# Patient Record
Sex: Female | Born: 1950 | Race: Black or African American | Hispanic: No | Smoking: Never smoker
Health system: Southern US, Community
[De-identification: ages and names within clinical notes are randomized; demographics above are authoritative.]

## PROBLEM LIST (undated history)

## (undated) DIAGNOSIS — K219 Gastro-esophageal reflux disease without esophagitis: Secondary | ICD-10-CM

## (undated) DIAGNOSIS — J90 Pleural effusion, not elsewhere classified: Secondary | ICD-10-CM

## (undated) DIAGNOSIS — I2510 Atherosclerotic heart disease of native coronary artery without angina pectoris: Secondary | ICD-10-CM

## (undated) DIAGNOSIS — I509 Heart failure, unspecified: Secondary | ICD-10-CM

## (undated) DIAGNOSIS — J069 Acute upper respiratory infection, unspecified: Secondary | ICD-10-CM

## (undated) DIAGNOSIS — I255 Ischemic cardiomyopathy: Secondary | ICD-10-CM

## (undated) DIAGNOSIS — Z8669 Personal history of other diseases of the nervous system and sense organs: Secondary | ICD-10-CM

## (undated) DIAGNOSIS — Z951 Presence of aortocoronary bypass graft: Secondary | ICD-10-CM

## (undated) DIAGNOSIS — I10 Essential (primary) hypertension: Secondary | ICD-10-CM

## (undated) DIAGNOSIS — I5023 Acute on chronic systolic (congestive) heart failure: Secondary | ICD-10-CM

## (undated) DIAGNOSIS — B9789 Other viral agents as the cause of diseases classified elsewhere: Secondary | ICD-10-CM

## (undated) DIAGNOSIS — R1110 Vomiting, unspecified: Secondary | ICD-10-CM

## (undated) DIAGNOSIS — E785 Hyperlipidemia, unspecified: Secondary | ICD-10-CM

## (undated) DIAGNOSIS — K851 Biliary acute pancreatitis without necrosis or infection: Secondary | ICD-10-CM

## (undated) DIAGNOSIS — E119 Type 2 diabetes mellitus without complications: Secondary | ICD-10-CM

## (undated) DIAGNOSIS — Z86010 Personal history of colonic polyps: Secondary | ICD-10-CM

## (undated) DIAGNOSIS — I5022 Chronic systolic (congestive) heart failure: Secondary | ICD-10-CM

## (undated) DIAGNOSIS — J189 Pneumonia, unspecified organism: Secondary | ICD-10-CM

## (undated) DIAGNOSIS — R0600 Dyspnea, unspecified: Secondary | ICD-10-CM

## (undated) HISTORY — DX: Acute upper respiratory infection, unspecified: J06.9

## (undated) HISTORY — DX: Pleural effusion, not elsewhere classified: J90

## (undated) HISTORY — DX: Other viral agents as the cause of diseases classified elsewhere: B97.89

## (undated) HISTORY — DX: Gastro-esophageal reflux disease without esophagitis: K21.9

## (undated) HISTORY — DX: Essential (primary) hypertension: I10

## (undated) HISTORY — DX: Vomiting, unspecified: R11.10

## (undated) HISTORY — DX: Acute on chronic systolic (congestive) heart failure: I50.23

## (undated) HISTORY — DX: Personal history of colonic polyps: Z86.010

## (undated) HISTORY — DX: Hyperlipidemia, unspecified: E78.5

## (undated) HISTORY — DX: Personal history of other diseases of the nervous system and sense organs: Z86.69

---

## 1994-01-29 HISTORY — PX: TOTAL ABDOMINAL HYSTERECTOMY W/ BILATERAL SALPINGOOPHORECTOMY: SHX83

## 1999-10-08 ENCOUNTER — Encounter: Payer: Self-pay | Admitting: Emergency Medicine

## 1999-10-08 ENCOUNTER — Emergency Department (HOSPITAL_COMMUNITY): Admission: EM | Admit: 1999-10-08 | Discharge: 1999-10-08 | Payer: Self-pay | Admitting: Emergency Medicine

## 1999-10-25 ENCOUNTER — Other Ambulatory Visit: Admission: RE | Admit: 1999-10-25 | Discharge: 1999-10-25 | Payer: Self-pay | Admitting: *Deleted

## 2002-01-22 ENCOUNTER — Emergency Department (HOSPITAL_COMMUNITY): Admission: EM | Admit: 2002-01-22 | Discharge: 2002-01-22 | Payer: Self-pay | Admitting: *Deleted

## 2002-03-18 ENCOUNTER — Encounter: Admission: RE | Admit: 2002-03-18 | Discharge: 2002-03-18 | Payer: Self-pay | Admitting: Internal Medicine

## 2002-03-24 ENCOUNTER — Ambulatory Visit (HOSPITAL_COMMUNITY): Admission: RE | Admit: 2002-03-24 | Discharge: 2002-03-24 | Payer: Self-pay | Admitting: *Deleted

## 2002-08-31 ENCOUNTER — Emergency Department (HOSPITAL_COMMUNITY): Admission: EM | Admit: 2002-08-31 | Discharge: 2002-08-31 | Payer: Self-pay | Admitting: Emergency Medicine

## 2002-09-02 ENCOUNTER — Emergency Department (HOSPITAL_COMMUNITY): Admission: EM | Admit: 2002-09-02 | Discharge: 2002-09-02 | Payer: Self-pay | Admitting: Emergency Medicine

## 2002-09-08 ENCOUNTER — Emergency Department (HOSPITAL_COMMUNITY): Admission: EM | Admit: 2002-09-08 | Discharge: 2002-09-08 | Payer: Self-pay | Admitting: Emergency Medicine

## 2002-09-08 ENCOUNTER — Encounter: Payer: Self-pay | Admitting: Emergency Medicine

## 2002-09-10 ENCOUNTER — Emergency Department (HOSPITAL_COMMUNITY): Admission: EM | Admit: 2002-09-10 | Discharge: 2002-09-10 | Payer: Self-pay | Admitting: Emergency Medicine

## 2002-11-08 ENCOUNTER — Encounter: Payer: Self-pay | Admitting: Emergency Medicine

## 2002-11-08 ENCOUNTER — Emergency Department (HOSPITAL_COMMUNITY): Admission: EM | Admit: 2002-11-08 | Discharge: 2002-11-08 | Payer: Self-pay | Admitting: Emergency Medicine

## 2002-11-09 ENCOUNTER — Encounter: Admission: RE | Admit: 2002-11-09 | Discharge: 2002-11-09 | Payer: Self-pay | Admitting: Internal Medicine

## 2002-11-11 ENCOUNTER — Encounter: Admission: RE | Admit: 2002-11-11 | Discharge: 2002-11-11 | Payer: Self-pay | Admitting: Internal Medicine

## 2003-07-12 ENCOUNTER — Encounter: Admission: RE | Admit: 2003-07-12 | Discharge: 2003-07-12 | Payer: Self-pay | Admitting: Internal Medicine

## 2003-07-14 ENCOUNTER — Ambulatory Visit (HOSPITAL_COMMUNITY): Admission: RE | Admit: 2003-07-14 | Discharge: 2003-07-14 | Payer: Self-pay | Admitting: Internal Medicine

## 2003-08-06 ENCOUNTER — Emergency Department (HOSPITAL_COMMUNITY): Admission: EM | Admit: 2003-08-06 | Discharge: 2003-08-06 | Payer: Self-pay | Admitting: Emergency Medicine

## 2004-12-07 ENCOUNTER — Ambulatory Visit (HOSPITAL_COMMUNITY): Admission: RE | Admit: 2004-12-07 | Discharge: 2004-12-07 | Payer: Self-pay | Admitting: Internal Medicine

## 2004-12-07 ENCOUNTER — Ambulatory Visit: Payer: Self-pay | Admitting: Internal Medicine

## 2004-12-20 ENCOUNTER — Ambulatory Visit: Payer: Self-pay | Admitting: Internal Medicine

## 2005-03-09 ENCOUNTER — Ambulatory Visit: Payer: Self-pay | Admitting: Internal Medicine

## 2005-03-22 ENCOUNTER — Encounter (INDEPENDENT_AMBULATORY_CARE_PROVIDER_SITE_OTHER): Payer: Self-pay | Admitting: Ophthalmology

## 2005-03-22 ENCOUNTER — Ambulatory Visit (HOSPITAL_COMMUNITY): Admission: RE | Admit: 2005-03-22 | Discharge: 2005-03-22 | Payer: Self-pay | Admitting: Internal Medicine

## 2005-05-31 ENCOUNTER — Ambulatory Visit: Payer: Self-pay | Admitting: Internal Medicine

## 2005-06-12 ENCOUNTER — Ambulatory Visit: Payer: Self-pay | Admitting: Internal Medicine

## 2005-07-12 ENCOUNTER — Ambulatory Visit: Payer: Self-pay | Admitting: Internal Medicine

## 2005-12-05 DIAGNOSIS — E785 Hyperlipidemia, unspecified: Secondary | ICD-10-CM

## 2005-12-05 DIAGNOSIS — E119 Type 2 diabetes mellitus without complications: Secondary | ICD-10-CM

## 2005-12-05 DIAGNOSIS — I10 Essential (primary) hypertension: Secondary | ICD-10-CM

## 2005-12-05 DIAGNOSIS — K219 Gastro-esophageal reflux disease without esophagitis: Secondary | ICD-10-CM

## 2005-12-05 DIAGNOSIS — E1122 Type 2 diabetes mellitus with diabetic chronic kidney disease: Secondary | ICD-10-CM | POA: Insufficient documentation

## 2005-12-05 HISTORY — DX: Type 2 diabetes mellitus without complications: E11.9

## 2005-12-05 HISTORY — DX: Gastro-esophageal reflux disease without esophagitis: K21.9

## 2005-12-05 HISTORY — DX: Essential (primary) hypertension: I10

## 2006-04-24 ENCOUNTER — Telehealth: Payer: Self-pay | Admitting: *Deleted

## 2006-04-29 ENCOUNTER — Emergency Department (HOSPITAL_COMMUNITY): Admission: EM | Admit: 2006-04-29 | Discharge: 2006-04-29 | Payer: Self-pay | Admitting: Emergency Medicine

## 2006-08-30 ENCOUNTER — Telehealth (INDEPENDENT_AMBULATORY_CARE_PROVIDER_SITE_OTHER): Payer: Self-pay | Admitting: *Deleted

## 2006-09-16 ENCOUNTER — Ambulatory Visit: Payer: Self-pay | Admitting: Internal Medicine

## 2006-09-16 LAB — CONVERTED CEMR LAB
Blood Glucose, Fingerstick: 499
Hgb A1c MFr Bld: 8.7 %

## 2006-09-17 ENCOUNTER — Encounter (INDEPENDENT_AMBULATORY_CARE_PROVIDER_SITE_OTHER): Payer: Self-pay | Admitting: *Deleted

## 2006-09-17 ENCOUNTER — Ambulatory Visit: Payer: Self-pay | Admitting: Internal Medicine

## 2006-09-20 ENCOUNTER — Ambulatory Visit (HOSPITAL_COMMUNITY): Admission: RE | Admit: 2006-09-20 | Discharge: 2006-09-20 | Payer: Self-pay | Admitting: Internal Medicine

## 2006-09-24 LAB — CONVERTED CEMR LAB
ALT: 30 units/L (ref 0–35)
AST: 26 units/L (ref 0–37)
Alkaline Phosphatase: 101 units/L (ref 39–117)
CO2: 28 meq/L (ref 19–32)
Calcium: 9.6 mg/dL (ref 8.4–10.5)
Cholesterol: 194 mg/dL (ref 0–200)
Creatinine, Ser: 0.86 mg/dL (ref 0.40–1.20)
Glucose, Bld: 182 mg/dL — ABNORMAL HIGH (ref 70–99)
Potassium: 5.1 meq/L (ref 3.5–5.3)
Sodium: 138 meq/L (ref 135–145)
Total CHOL/HDL Ratio: 3.3
VLDL: 23 mg/dL (ref 0–40)

## 2006-12-01 ENCOUNTER — Emergency Department (HOSPITAL_COMMUNITY): Admission: EM | Admit: 2006-12-01 | Discharge: 2006-12-01 | Payer: Self-pay | Admitting: Emergency Medicine

## 2007-06-27 DIAGNOSIS — H538 Other visual disturbances: Secondary | ICD-10-CM

## 2007-06-30 DIAGNOSIS — Z8669 Personal history of other diseases of the nervous system and sense organs: Secondary | ICD-10-CM

## 2007-06-30 HISTORY — DX: Personal history of other diseases of the nervous system and sense organs: Z86.69

## 2007-07-01 ENCOUNTER — Observation Stay (HOSPITAL_COMMUNITY): Admission: AD | Admit: 2007-07-01 | Discharge: 2007-07-03 | Payer: Self-pay | Admitting: Hospitalist

## 2007-07-01 ENCOUNTER — Ambulatory Visit: Payer: Self-pay | Admitting: Hospitalist

## 2007-07-01 ENCOUNTER — Ambulatory Visit: Payer: Self-pay | Admitting: *Deleted

## 2007-07-01 ENCOUNTER — Ambulatory Visit: Payer: Self-pay | Admitting: Cardiovascular Disease

## 2007-07-01 ENCOUNTER — Encounter: Admission: RE | Admit: 2007-07-01 | Discharge: 2007-07-01 | Payer: Self-pay | Admitting: *Deleted

## 2007-07-01 LAB — CONVERTED CEMR LAB: Hgb A1c MFr Bld: 7.5 %

## 2007-07-02 ENCOUNTER — Encounter (INDEPENDENT_AMBULATORY_CARE_PROVIDER_SITE_OTHER): Payer: Self-pay | Admitting: Hospitalist

## 2007-07-02 ENCOUNTER — Ambulatory Visit: Payer: Self-pay | Admitting: Vascular Surgery

## 2007-07-02 DIAGNOSIS — R809 Proteinuria, unspecified: Secondary | ICD-10-CM

## 2007-07-23 ENCOUNTER — Ambulatory Visit: Payer: Self-pay | Admitting: Internal Medicine

## 2007-08-08 ENCOUNTER — Ambulatory Visit: Payer: Self-pay | Admitting: Infectious Diseases

## 2007-08-08 LAB — CONVERTED CEMR LAB: Blood Glucose, Fingerstick: 266

## 2007-08-21 ENCOUNTER — Ambulatory Visit: Payer: Self-pay | Admitting: Infectious Diseases

## 2007-10-03 ENCOUNTER — Encounter: Payer: Self-pay | Admitting: Internal Medicine

## 2007-10-07 DIAGNOSIS — Z860100 Personal history of colon polyps, unspecified: Secondary | ICD-10-CM | POA: Insufficient documentation

## 2007-10-07 HISTORY — DX: Personal history of colon polyps, unspecified: Z86.0100

## 2007-10-07 HISTORY — DX: Personal history of colonic polyps: Z86.010

## 2008-02-10 ENCOUNTER — Encounter (INDEPENDENT_AMBULATORY_CARE_PROVIDER_SITE_OTHER): Payer: Self-pay | Admitting: *Deleted

## 2008-02-10 ENCOUNTER — Ambulatory Visit: Payer: Self-pay | Admitting: Infectious Disease

## 2008-02-10 ENCOUNTER — Encounter: Payer: Self-pay | Admitting: Internal Medicine

## 2008-02-10 LAB — CONVERTED CEMR LAB: Hgb A1c MFr Bld: 12 %

## 2008-02-11 LAB — CONVERTED CEMR LAB
CO2: 24 meq/L (ref 19–32)
Calcium: 9.3 mg/dL (ref 8.4–10.5)
Chloride: 101 meq/L (ref 96–112)
Glucose, Bld: 297 mg/dL — ABNORMAL HIGH (ref 70–99)
HCT: 39.2 % (ref 36.0–46.0)
HDL: 61 mg/dL (ref >39)
Hemoglobin: 12.3 g/dL (ref 12.0–15.0)
LDL Cholesterol: 155 mg/dL — ABNORMAL HIGH (ref 0–99)
Microalb Creat Ratio: 351.3 mg/g — ABNORMAL HIGH (ref 0.0–30.0)
Microalb, Ur: 39.3 mg/dL — ABNORMAL HIGH (ref 0.00–1.89)
Potassium: 4.8 meq/L (ref 3.5–5.3)
RBC: 4.71 M/uL (ref 3.87–5.11)
RDW: 14.1 % (ref 11.5–15.5)
Sodium: 138 meq/L (ref 135–145)
Total CHOL/HDL Ratio: 3.9
Total Protein: 7.4 g/dL (ref 6.0–8.3)
Triglycerides: 98 mg/dL (ref <150)
VLDL: 20 mg/dL (ref 0–40)

## 2009-04-27 ENCOUNTER — Telehealth: Payer: Self-pay | Admitting: *Deleted

## 2009-05-18 ENCOUNTER — Ambulatory Visit: Payer: Self-pay | Admitting: Internal Medicine

## 2009-05-18 LAB — CONVERTED CEMR LAB
Blood Glucose, Fingerstick: 301
Blood Glucose, Home Monitor: 3 mg/dL

## 2009-05-19 ENCOUNTER — Telehealth: Payer: Self-pay | Admitting: Internal Medicine

## 2009-05-19 LAB — CONVERTED CEMR LAB
ALT: 12 units/L (ref 0–35)
AST: 13 units/L (ref 0–37)
Albumin: 3.9 g/dL (ref 3.5–5.2)
BUN: 14 mg/dL (ref 6–23)
Glucose, Bld: 290 mg/dL — ABNORMAL HIGH (ref 70–99)
Microalb, Ur: 39.01 mg/dL — ABNORMAL HIGH (ref 0.00–1.89)
Total Bilirubin: 0.4 mg/dL (ref 0.3–1.2)
Total CHOL/HDL Ratio: 4.1
Total Protein: 7.5 g/dL (ref 6.0–8.3)
VLDL: 23 mg/dL (ref 0–40)

## 2009-05-24 ENCOUNTER — Encounter (INDEPENDENT_AMBULATORY_CARE_PROVIDER_SITE_OTHER): Payer: Self-pay | Admitting: Dermatology

## 2009-08-19 ENCOUNTER — Ambulatory Visit: Payer: Self-pay | Admitting: Internal Medicine

## 2009-08-19 ENCOUNTER — Encounter (INDEPENDENT_AMBULATORY_CARE_PROVIDER_SITE_OTHER): Payer: Self-pay | Admitting: *Deleted

## 2009-08-19 LAB — CONVERTED CEMR LAB: Hgb A1c MFr Bld: 12.5 %

## 2009-08-24 ENCOUNTER — Ambulatory Visit (HOSPITAL_COMMUNITY): Admission: RE | Admit: 2009-08-24 | Discharge: 2009-08-24 | Payer: Self-pay | Admitting: Internal Medicine

## 2009-08-24 LAB — HM MAMMOGRAPHY

## 2009-09-23 ENCOUNTER — Encounter (INDEPENDENT_AMBULATORY_CARE_PROVIDER_SITE_OTHER): Payer: Self-pay

## 2009-09-27 ENCOUNTER — Ambulatory Visit: Payer: Self-pay | Admitting: Internal Medicine

## 2009-10-11 ENCOUNTER — Ambulatory Visit: Payer: Self-pay | Admitting: Internal Medicine

## 2009-10-11 LAB — HM COLONOSCOPY: HM Colonoscopy: 3

## 2009-10-18 ENCOUNTER — Encounter: Payer: Self-pay | Admitting: Internal Medicine

## 2010-02-28 NOTE — Progress Notes (Signed)
Summary: diabetes testing supplies/dmr  Phone Note Other Incoming   Summary of Call: [facility] called about referral-they cannot reach [facility] on either phone mumber we have listed. She has the state health plan july to july- she has not paid toward deductible so will pay out of pocket using [facility] wheras she will get some coverage at retail- 150 test strips at the pharmacy.  need rx for testing supplies sent to local pharmacy  Follow-up for Phone Call        Rx completed. Follow-up by: Eduardo Osier DO,  May 19, 2009 1:42 PM    Prescriptions: ULTRA THIN LANCETS 30G  MISC (LANCETS) use to toest blood sugar 3x daily  #200 x 11   Entered by:   Barnabas Harries RD,CDE   Authorized by:   Eduardo Osier DO   Signed by:   Eduardo Osier DO on 05/19/2009   Method used:   Electronically to        [facility] [phone]* (retail)       [place], [place]  [id number]       Ph: [phone] or [phone]       Fax: [phone]   RxID:   [id number] BAYER CONTOUR TEST  STRP (GLUCOSE BLOOD) use to test blood sugar 3x daily before meals or as specified by your physician  #150 x 11   Entered by:   Barnabas Harries RD,CDE   Authorized by:   Eduardo Osier DO   Signed by:   Eduardo Osier DO on 05/19/2009   Method used:   Electronically to        [facility] [phone]* (retail)       [place], [place]  [id number]       Ph: [phone] or [phone]       Fax: [phone]   RxID:   [id number]

## 2010-02-28 NOTE — Procedures (Signed)
Summary: Colonoscopy  Patient: [name] Note: All result statuses are Final unless otherwise noted.  Tests: (1) Colonoscopy (COL)   COL Colonoscopy           [name].     [place], [name]           COLONOSCOPY PROCEDURE REPORT           PATIENT:  [name], [name]  MR#:  [id number]     BIRTHDATE:  [birth year], 51 yrs. old  GENDER:  female     ENDOSCOPIST:  Gatha Mayer, MD, [facility]     REF. BY:          Sheron Nightingale, MD [facility]           PROCEDURE DATE:  10/11/2009     PROCEDURE:  Colonoscopy with snare polypectomy     ASA CLASS:  Class II     INDICATIONS:  Routine Risk Screening     MEDICATIONS:   Fentanyl 50 mcg IV, Versed 5 mg IV           DESCRIPTION OF PROCEDURE:   After the risks benefits and     alternatives of the procedure were thoroughly explained, informed     consent was obtained.  Digital rectal exam was performed and     revealed no abnormalities.   The LB [id number] endoscope     was introduced through the anus and advanced to the cecum, which     was identified by both the appendix and ileocecal valve, without     limitations.  The quality of the prep was excellent, using     MoviPrep.  The instrument was then slowly withdrawn as the colon     was fully examined. Insertion: 7:09 minutes Withdrawal: 10:04     minutes     <<PROCEDUREIMAGES>>           FINDINGS:  Three polyps were found. 4mm cecal, 22mm and 6 mm left     colon polyps. Two polyps were snared without cautery. Retrieval     was successful. The 48mm polyp was snared, then cauterized with     monopolar cautery. Retrieval was successful.  This was otherwise a     normal examination of the colon.   Retroflexed views in the rectum     revealed no abnormalities.    The scope was then withdrawn from     the patient and the procedure completed.           COMPLICATIONS:  None     ENDOSCOPIC IMPRESSION:     1) Three  polyps removed, maximum size 7 mm.     2) Otherwise normal examination, excellent prep     RECOMMENDATIONS:     1) No aspirin or NSAID's for 1 week.     REPEAT EXAM:  In for Colonoscopy, pending biopsy results.           Gatha Mayer, MD, [facility]           CC:  The Patient     Sheron Nightingale, MD           n.     Lorrin Mais:   Gatha Mayer at 10/11/2009 09:14 AM           Cheral Marker, [id number]  Note: An exclamation mark (!) indicates a result that was not dispersed into the  flowsheet. Document Creation Date: 10/11/2009 9:16 AM _______________________________________________________________________  (1) Order result status: Final Collection or observation date-time: 10/11/2009 09:04 Requested date-time:  Receipt date-time:  Reported date-time:  Referring Physician:   Ordering Physician: Silvano Rusk [phone]) Specimen Source:  Source: Tawanna Cooler Order Number: [id number] Lab site:   Appended Document: Colonoscopy     Procedures Next Due Date:    Colonoscopy: 09/2012

## 2010-02-28 NOTE — Progress Notes (Signed)
Summary: Attempt to call pt re: HLD and need to start pravachol  Phone Note Outgoing Call   Call placed by: Eduardo Osier DO,  May 19, 2009 1:44 PM Action Taken: Information Sent Details for Reason: Review of labs Summary of Call: Attempted to call patient to review labs and inform her of elevated lipids and need to resume statin therapy.  Current home phone number disconnected; second number in our records is that of her previous employer.  Will try to locate a working number for the patient to discuss lab results and need to start Pravachol.

## 2010-02-28 NOTE — Letter (Signed)
Summary: Previsit letter  [facility]  [place], [place] [address]   Phone: [phone]  Fax: [phone]       08/19/2009 MRN: [id number]  [address] [place], [place]  [address]  Dear Ms. Orona,  Welcome to the Gastroenterology Division at [facility].    You are scheduled to see a nurse for your pre-procedure visit on 09-27-09 at 8:30a.m. on the 3rd floor at [facility], [place] [employer].  We ask that you try to arrive at our office 15 minutes prior to your appointment time to allow for check-in.  Your nurse visit will consist of discussing your medical and surgical history, your immediate family medical history, and your medications.    Please bring a complete list of all your medications or, if you prefer, bring the medication bottles and we will list them.  We will need to be aware of both prescribed and over the counter drugs.  We will need to know exact dosage information as well.  If you are on blood thinners (Coumadin, Plavix, Aggrenox, Ticlid, etc.) please call our office today/prior to your appointment, as we need to consult with your physician about holding your medication.   Please be prepared to read and sign documents such as consent forms, a financial agreement, and acknowledgement forms.  If necessary, and with your consent, a friend or relative is welcome to sit-in on the nurse visit with you.  Please bring your insurance card so that we may make a copy of it.  If your insurance requires a referral to see a specialist, please bring your referral form from your primary care physician.  No co-pay is required for this nurse visit.     If you cannot keep your appointment, please call [phone] to cancel or reschedule prior to your appointment date.  This allows [place] the opportunity to schedule an appointment for another patient in need of care.    Thank you for choosing [facility] for your medical  needs.  We appreciate the opportunity to care for you.  Please visit [place] at our website  to learn more about our practice.                     Sincerely.                                                                                                                   The Gastroenterology Division

## 2010-02-28 NOTE — Letter (Signed)
Summary: Diabetic Instructions  [facility]  [place], [place] [address]   Phone: [phone]  Fax: [phone]    [name] [birth year] MRN: [id number]   _  _   ORAL DIABETIC MEDICATION INSTRUCTIONS  The day before your procedure:   Take your diabetic pill as you do normally  The day of your procedure:   Do not take your diabetic pill    We will check your blood sugar levels during the admission process and again in Recovery before discharging you home  ________________________________________________________________________  _  _   INSULIN (LONG ACTING) MEDICATION INSTRUCTIONS (Lantus, NPH, 70/30, Humulin, Novolin-N)   The day before your procedure:   Take  your regular evening dose    The day of your procedure:   Do not take your morning dose    _  _   INSULIN (SHORT ACTING) MEDICATION INSTRUCTIONS (Regular, Humulog, Novolog)   The day before your procedure:   Do not take your evening dose   The day of your procedure:   Do not take your morning dose   _  _   INSULIN PUMP MEDICATION INSTRUCTIONS  We will contact the physician managing your diabetic care for written dosage instructions for the day before your procedure and the day of your procedure.  Once we have received the instructions, we will contact you.

## 2010-02-28 NOTE — Letter (Signed)
Summary: [facility]   Imported By: Garlan Fillers 05/24/2009 13:58:53  _____________________________________________________________________  External Attachment:    Type:   Image     Comment:   External Document

## 2010-02-28 NOTE — Miscellaneous (Signed)
Summary: Lec previsit  Clinical Lists Changes  Medications: Added new medication of MOVIPREP 100 GM  SOLR (PEG-KCL-NACL-NASULF-NA ASC-C) As per prep instructions. - Signed Rx of MOVIPREP 100 GM  SOLR (PEG-KCL-NACL-NASULF-NA ASC-C) As per prep instructions.;  #1 x 0;  Signed;  Entered by: Cornelia Copa RN;  Authorized by: Gatha Mayer MD, [facility];  Method used: Electronically to [facility] [phone]*, [address] [place], [place], [place]  [id number], Ph: [phone] or [phone], Fax: [phone] Observations: Added new observation of NKA: T (09/27/2009 8:34)    Prescriptions: MOVIPREP 100 GM  SOLR (PEG-KCL-NACL-NASULF-NA ASC-C) As per prep instructions.  #1 x 0   Entered by:   Cornelia Copa RN   Authorized by:   Gatha Mayer MD, [facility]   Signed by:   Cornelia Copa RN on 09/27/2009   Method used:   Electronically to        [facility] [phone]* (retail)       [place], [place]  [id number]       Ph: [phone] or [phone]       Fax: [phone]   RxID:   [id number]

## 2010-02-28 NOTE — Assessment & Plan Note (Signed)
Summary: EST-CK/FU/MEDS/CFB   Vital Signs:  Patient profile:   60 year old female Height:      63 inches (160.02 cm) Weight:      198.6 pounds (90.27 kg) BMI:     35.31 Temp:     99.1 degrees F oral Pulse rate:   103 / minute BP sitting:   133 / 82  (right arm)  Vitals Entered By: Morrison Old RN (August 19, 2009 3:37 PM) CC: Check-up. Medication refills - has not taken meds. in about1.5 months. Is Patient Diabetic? Yes Did you bring your meter with you today? No Pain Assessment Patient in pain? no      Nutritional Status BMI of > 30 = obese CBG Result 387  Have you ever been in a relationship where you felt threatened, hurt or afraid?No   Does patient need assistance? Functional Status Self care Ambulation Normal   Primary Care Provider:  Harriett Sine MD  CC:  Check-up. Medication refills - has not taken meds. in about1.5 months..  History of Present Illness: Pt is a 60 y/o F with PMH outlined in the EMR. She presents for routine f/u of her chronic medical conditions.   1. DM2: pt has not been taking her metformin for 2 months; states she ran out of her medicine.  She denies any symptoms of hypoglycemia.  Denies any adverse effects from medicines she tried in the past.  She denies increased thirst, increased urination, weight changes, and vision changes.  She has an appointment scheduled with optho tomorrow.  2. HTN: pt has also not taken her lisinopril for the past 2 month.  Denies H/A,C/P, dizziness, syncope, SOB, DOE, vision changes, and neuro deficits.  4. Grief: pt reports that she is doing well since the death of her husband.  She reports lots of support from family and friends.   She has no other concerns or complaints today.  Depression History:      The patient denies a depressed mood most of the day and a diminished interest in her usual daily activities.  The patient denies significant weight loss, significant weight gain, insomnia, and hypersomnia.  The  patient denies symptoms of a manic disorder including persistently & abnormally elevated mood and abnormally & persistently irritable mood.         Preventive Screening-Counseling & Management  Alcohol-Tobacco     Alcohol drinks/day: 0     Smoking Status: never     Passive Smoke Exposure: no  Caffeine-Diet-Exercise     Does Patient Exercise: yes - sometimes     Type of exercise: walking     Times/week: 4  Current Medications (verified): 1)  Lisinopril 10 Mg Tabs (Lisinopril) .... Take 1 Tablet By Mouth Once A Day 2)  Metformin Hcl 1000 Mg Tabs (Metformin Hcl) .... Take 1 Tablet By Mouth Two Times A Day 3)  Prilosec 20 Mg Cpdr (Omeprazole) .... Take On Pill By Mouth Once Daily 4)  Bayer Contour Test  Strp (Glucose Blood) .... Use To Test Blood Sugar 3x Daily Before Meals or As Specified By Your Physician 5)  Ultra Thin Lancets 30g  Misc (Lancets) .... Use To Toest Blood Sugar 3x Daily  Allergies (verified): No Known Drug Allergies  Past History:  Past medical, surgical, family and social histories (including risk factors) reviewed for relevance to current acute and chronic problems.  Past Medical History: Reviewed history from 07/23/2007 and no changes required. Diabetes mellitus, type II    - dx ~1998    -  microalbuminuria    - never on insulin GERD HYPERLIPIDEMIA HYPERTENSION Hx of celluliits, L foot Hospitalized for blurry vision in 6/09  Past Surgical History: Reviewed history from 09/16/2006 and no changes required. TAH-BSO (1996)  Family History: Reviewed history from 09/16/2006 and no changes required. Mother died in 83 - DM, on hemodialysis, died of an MI (age 57). Father died in 37 - sepsis from pneumonia? 45 brothers, 4 sisters.  1 sister died of an MI at 67. 1 brother died of an MI at 41, 1 brother died of PNA at 76, 1 brother died of diabetes complications.  Social History: Reviewed history from 05/18/2009 and no changes required. Married x 42 yrs.   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 Never smoked. Doesn't drink or use drugs.Does Patient Exercise:  yes - sometimes  Physical Exam  General:  alert, well-developed, well-nourished, and well-hydrated.   Head:  normocephalic and atraumatic.   Eyes:  vision grossly intact.  PERRLA. EOMI.  Sclerae anicteric. Conjunctivae without injection or pallor. Mouth:  pharynx pink and moist.   Neck:  supple. no masses.   Lungs:  normal respiratory effort, mildly decreases BS bilaterally.  Lungs are clear to asculation with good air movement. Heart:  normal rate, regular rhythm, no murmur, no gallop, and no rub.   Abdomen:  soft, non-tender, normal bowel sounds, no distention, and no masses.   Extremities:  No clubbing, cyanosis, or edema. Neurologic:  alert & oriented X3.  No focal deficit Psych:  Oriented X3, memory intact for recent and remote, normally interactive, not anxious appearing, and not depressed appearing.   Poor eye contact.   Impression & Recommendations:  Problem # 1:  HYPERTENSION (ICD-401.9) Pts BP is at goal despite not taking her lisinopril for 2 months.  Reviewed the importance of Ace inhibitors for diabetic patients; pt voices understanding and states she will take lisinopril if she is given a refill.  Denies any difficulty affording her meds.  Her updated medication list for this problem includes:    Lisinopril 10 Mg Tabs (Lisinopril) .Marland Kitchen... Take 1 tablet by mouth once a day  BP today: 133/82 Prior BP: 141/87 (05/18/2009)  Prior 10 Yr Risk Heart Disease: Not enough information (09/16/2006)  Labs Reviewed: K+: 4.9 (05/18/2009) Creat: : 0.67 (05/18/2009)   Chol: 253 (05/18/2009)   HDL: 62 (05/18/2009)   LDL: 168 (05/18/2009)   TG: 115 (05/18/2009)  Problem # 2:  DIABETES MELLITUS, TYPE II (ICD-250.00) Pts DM2 remains poorly controlled.  Discussed the importance of improved DM control to prevent debiliating sequelae of  DM2.  Pt denies adverse effects from metformin and denies difficulty affording medicine.  States she understands the need and importance of CBG control and states she will take her metformin.  She declines an appt with Barnabas Harries for further education and assistance managing her DM2.  I doubt she will reach her goal A1c on metformin alone, but she should have some improvement of her A1c.  Stronly encouraged her to take her medicine.  Will give a refill and informed her OPC is always capable of refilling rxs should she run out in the future.  Her updated medication list for this problem includes:    Lisinopril 10 Mg Tabs (Lisinopril) .Marland Kitchen... Take 1 tablet by mouth once a day    Metformin Hcl 1000 Mg Tabs (Metformin hcl) .Marland Kitchen... Take 1 tablet by mouth two times a day  Orders: T- Capillary Blood Glucose ([id number]) T-Hgb A1C (in-house) [id number])  Labs Reviewed: Creat: 0.67 (05/18/2009)    Reviewed HgBA1c results: 12.5 (08/19/2009)  12.2 (05/18/2009)  Problem # 3:  Preventive Health Care (ICD-V70.0) Pt mentions routine breast and colon screening and is amenable to proceeding with colonoscopy and mammogram.  Will refer her for both today.    Problem # 4:  Screening Breast Cancer (ICD-V76.10) Will refer her for mammogram today.  Complete Medication List: 1)  Lisinopril 10 Mg Tabs (Lisinopril) .... Take 1 tablet by mouth once a day 2)  Metformin Hcl 1000 Mg Tabs (Metformin hcl) .... Take 1 tablet by mouth two times a day 3)  Prilosec 20 Mg Cpdr (Omeprazole) .... Take on pill by mouth once daily 4)  Bayer Contour Test Strp (Glucose blood) .... Use to test blood sugar 3x daily before meals or as specified by your physician 5)  Ultra Thin Lancets 30g Misc (Lancets) .... Use to toest blood sugar 3x daily  Other Orders: Mammogram (Screening) (Mammo) Gastroenterology Referral (GI)  Patient Instructions: 1)  Please schedule a follow-up appointment in 2-3 months. 2)  Take your medicines regularly. 3)   If you have any problems with your medicines, or need refills,call the clinic at [phone]. Prescriptions: LISINOPRIL 10 MG TABS (LISINOPRIL) Take 1 tablet by mouth once a day  #31 x 6   Entered and Authorized by:   Eduardo Osier DO   Signed by:   Eduardo Osier DO on 08/19/2009   Method used:   Print then Give to Patient   RxID:   [id number] METFORMIN HCL 1000 MG TABS (METFORMIN HCL) Take 1 tablet by mouth two times a day  #60 x 6   Entered and Authorized by:   Eduardo Osier DO   Signed by:   Eduardo Osier DO on 08/19/2009   Method used:   Print then Give to Patient   RxID:   [id number]   Prevention & Chronic Care Immunizations   Influenza vaccine: Not documented   Influenza vaccine deferral: Deferred  (05/18/2009)    Tetanus booster: Not documented   Td booster deferral: Deferred  (05/18/2009)    Pneumococcal vaccine: Not documented  Colorectal Screening   Hemoccult: Not documented    Colonoscopy: Not documented   Colonoscopy action/deferral: GI referral  (08/19/2009)  Other Screening   Pap smear: Not documented    Mammogram: Not documented   Mammogram action/deferral: Ordered  (08/19/2009)   Smoking status: never  (08/19/2009)  Diabetes Mellitus   HgbA1C: 12.5  (08/19/2009)    Eye exam: Not documented   Diabetic eye exam action/deferral: Ophthalmology referral  (05/18/2009)    Foot exam: yes  (05/18/2009)   Foot exam action/deferral: Do today   High risk foot: Not documented   Foot care education: Not documented    Urine microalbumin/creatinine ratio: 290.7  (05/18/2009)   Urine microalbumin action/deferral: Ordered    Diabetes flowsheet reviewed?: Yes   Progress toward A1C goal: Unchanged  Lipids   Total Cholesterol: 253  (05/18/2009)   LDL: 168  (05/18/2009)   LDL Direct: Not documented   HDL: 62  (05/18/2009)   Triglycerides: 115  (05/18/2009)    SGOT (AST): 13  (05/18/2009)   SGPT (ALT): 12  (05/18/2009)   Alkaline phosphatase: 98   (05/18/2009)   Total bilirubin: 0.4  (05/18/2009)    Lipid flowsheet reviewed?: Yes   Progress toward LDL goal: Unchanged  Hypertension   Last Blood Pressure: 133 / 82  (08/19/2009)   Serum creatinine: 0.67  (05/18/2009)   Serum potassium 4.9  (  05/18/2009)    Hypertension flowsheet reviewed?: Yes   Progress toward BP goal: At goal  Self-Management Support :   Personal Goals (by the next clinic visit) :     Personal A1C goal: 7  (08/19/2009)     Personal blood pressure goal: 130/80  (05/18/2009)     Personal LDL goal: 100  (08/19/2009)    Patient will work on the following items until the next clinic visit to reach self-care goals:     Medications and monitoring: take my medicines every day, bring all of my medications to every visit, examine my feet every day  (08/19/2009)     Eating: eat more vegetables, use fresh or frozen vegetables, eat foods that are low in salt, eat baked foods instead of fried foods, eat fruit for snacks and desserts  (08/19/2009)     Activity: take a 30 minute walk every day  (08/19/2009)    Diabetes self-management support: Written self-care plan  (08/19/2009)   Diabetes care plan printed   Last diabetes self-management training by diabetes educator: 05/18/2009    Hypertension self-management support: Written self-care plan  (08/19/2009)   Hypertension self-care plan printed.    Lipid self-management support: Written self-care plan  (08/19/2009)   Lipid self-care plan printed.   Nursing Instructions: Schedule screening mammogram (see order) GI referral for screening colonoscopy (see order)     Laboratory Results   Blood Tests   Date/Time Received: August 19, 2009 3:50 PM  Date/Time Reported: Lenoria Farrier  August 19, 2009 3:50 PM   HGBA1C: 12.5%   (Normal Range: Non-Diabetic - 3-6%   Control Diabetic - 6-8%) CBG Random:: 387mg /dL

## 2010-02-28 NOTE — Assessment & Plan Note (Signed)
Summary: diabetes/dmr  CBG Result 468 CBG Device ID contour Comments counsled patient to drink plenty of water, eat lower carb foods and start diabetes medicine as soon as possible   Allergies: No Known Drug Allergies   Complete Medication List: 1)  Lisinopril 10 Mg Tabs (Lisinopril) .... Take 1 tablet by mouth once a day 2)  Metformin Hcl 500 Mg Tabs (Metformin hcl) .Marland Kitchen.. 1 pill po with dinner x 7 days, then 1 pill bid  x 7 days, then 1 pill  qam and 2 with dinner x 7 days, then 2 pills bid 3)  Prilosec 20 Mg Cpdr (Omeprazole) .... Take on pill by mouth once daily 4)  Bayer Contour Test Strp (Glucose blood) .... Use to test blood sugar 3x daily before meals or as specified by your physician 5)  Ultra Thin Lancets 30g Misc (Lancets) .... Use to toest blood sugar 3x daily  Other Orders: DSMT(Medicare) Individual, 30 Minutes [id number])  Diabetes Self Management Training  PCP: Harriett Sine MD Date diagnosed with diabetes: 01/30/1996 Diabetes Type: Type 2 non-insulin Current smoking Status: never  Diabetes Medications:  Comments: showed patient diefferent meters- she wanted oen that is simple- she picked the new USB [employer] Contour. note medicine non-adherence    Monitoring Self monitoring blood glucose 3 times a day Name of Meter  contour  Time of Testing  Before meals  Recent Episodes of: Requiring Help from another person  Hyperglycemia : Yes     Diabetes Disease Process  Discussed today State diabetes is treated by meal plan-exercise-medication-monitoring-education: Demonstrates competency    Medications Describe safe needle/lancet disposal: Needs review/assistance Nutritional Management Identify what foods most often affect blood glucose: Needs review/assistance    State changes planned for home meals/snacks: Needs review/assistance    Monitoring Perform glucose monitoring/ketone testing and record results correctly: Demonstrates competency      Complications State the causes-signs and symptoms and prevention of Hyperglycemia: Needs review/assistanceExplain proper treatment of hyperglycemia: Needs review/assistance Exercise Diabetes Management Education Done: 05/18/2009    BEHAVIORAL GOALS INITIAL Monitoring blood glucose levels daily: check blood sugar daily, bring meter to clinic        Diabetes Self Management Support: need to explore at next visit especially inligh tof her recent loss of spouse Follow-up:2-4 weeks or as ready for dsmt

## 2010-02-28 NOTE — Letter (Signed)
Summary: Patient Notice- Polyp Results  [facility]  [address] [place], [place] [address]   Phone: [phone]  Fax: [phone]        October 18, 2009 MRN: [id number]    [name] [place], [place]  [address]    Dear Ms. 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 This means that they were pre-cancerous or that  they had the potential to change into cancer over time.   I recommend that you have a repeat colonoscopy in 3 years (about September 2014) to determine if you have developed any new polyps over time. If you develop any new rectal bleeding, abdominal pain or significant bowel habit changes, please contact us before then.   Please call us if you are having persistent problems or have questions about your condition that have not been fully answered at this time.  Sincerely,  Gatha Mayer MD, [facility]  This letter has been electronically signed by your physician.  Appended Document: Patient Notice- Polyp Results letter mailed

## 2010-02-28 NOTE — Assessment & Plan Note (Signed)
Summary: ACUTE-MEDICATIONS/CFB   Vital Signs:  Patient profile:   60 year old female Height:      63 inches Weight:      197.5 pounds BMI:     35.11 Temp:     98.2 degrees F oral Pulse rate:   88 / minute BP sitting:   141 / 87  (right arm)  Vitals Entered By: Silverio Decamp NT II (May 18, 2009 10:21 AM) CC: checkup Is Patient Diabetic? Yes Did you bring your meter with you today? No Pain Assessment Patient in pain? no      Nutritional Status BMI of > 30 = obese CBG Result 301  Have you ever been in a relationship where you felt threatened, hurt or afraid?No   Does patient need assistance? Functional Status Self care Ambulation Normal   Diabetic Foot Exam Foot Inspection Is there a history of a foot ulcer?              No Is there a foot ulcer now?              No Can the patient see the bottom of their feet?          Yes Are the shoes appropriate in style and fit?          No Is there swelling or an abnormal foot shape?          No Are the toenails long?                No Are the toenails thick?                No Are the toenails ingrown?              No Is there heavy callous build-up?              No Is there pain in the calf muscle (Intermittent claudication) when walking?    NoIs there a claw toe deformity?              No Is there elevated skin temperature?            No Is there limited ankle dorsiflexion?            No Is there foot or ankle muscle weakness?            No  Diabetic Foot Care Education    10-g (5.07) Semmes-Weinstein Monofilament Test Performed by: Silverio Decamp NT II          Right Foot          Left Foot Site 1         normal         normal Site 2         normal         normal Site 3         normal         normal Site 4         normal         normal Site 5         normal         normal Site 6         normal         normal Site 7         normal         normal Site 8         normal  normal Site 9         normal          normal  Impression      normal         normal   Primary Care Provider:  Harriett Sine MD  CC:  checkup.  History of Present Illness: Pt is a 60 y/o F with PMH outlined in the EMR. She presents for routine f/u of her chronic medication conditions.  SHe has not been seen since Jan, 2010.  1. DM2: pt has not been taking any medicine or checking CBGs in > 1 year.  She states this is because she was caring for her sick husband and did not have the time or energy to care for herself; her husband died 2 weeks ago.  She denies any symptoms of hypoglycemia.  Denies any adverse effects from medicines she tried in the past.  She denies increased thirst, increased urination, weight changes, and vision changes.  She has an appointment scheduled with optho tomorrow.  2. HTN: pt has also not taken any BP medicine for > 1 year.  Denies H/A,C/P, dizziness, syncope, SOB, DOE, vision changes, and neuro deficits.  3. GERD: pt reports frequent episodes of heartburn.  She is currently using Tums as needed with fair control of her symptoms.  Denies abdominal pain, nausea, vomiting, hematemesis, dark black stool, and BRBPR.  4. Grief: pt reports death of husband 2 weeks ago.  She reports feeling as one would expect after a loss.  She has lots of support from family and friends and feels she is adjusting well.  She is able to get up and perform her usual activities without significant effort. She denies thoughts of self-harm and suicide.  Does not feel she needs referral to psychologist/psychiatrist at this time.  She has no other concerns or complaints today.   Preventive Screening-Counseling & Management  Alcohol-Tobacco     Smoking Status: never     Passive Smoke Exposure: no  Caffeine-Diet-Exercise     Does Patient Exercise: yes     Type of exercise: walking     Times/week: 4  Current Problems (verified): 1)  Grief Reaction, Acute  (ICD-309.0) 2)  Microalbuminuria  (ICD-791.0) 3)  Blurred Vision   (ICD-368.8) 4)  Cellulitis, Foot, Left  (ICD-682.7) 5)  Hypertension  (ICD-401.9) 6)  Hyperlipidemia  (ICD-272.4) 7)  Gerd  (ICD-530.81) 8)  Diabetes Mellitus, Type II  (ICD-250.00)  Current Medications (verified): 1)  Lisinopril 10 Mg Tabs (Lisinopril) .... Take 1 Tablet By Mouth Once A Day 2)  Metformin Hcl 500 Mg Tabs (Metformin Hcl) .Marland Kitchen.. 1 Pill Po With Dinner X 7 Days, Then 1 Pill Bid  X 7 Days, Then 1 Pill  Qam and 2 With Dinner X 7 Days, Then 2 Pills Bid 3)  Prilosec 20 Mg Cpdr (Omeprazole) .... Take On Pill By Mouth Once Daily  Allergies (verified): No Known Drug Allergies  Past History:  Past medical, surgical, family and social histories (including risk factors) reviewed, and any changes appropriately noted.  Past Medical History: Reviewed history from 07/23/2007 and no changes required. Diabetes mellitus, type II    - dx ~1998    - microalbuminuria    - never on insulin GERD HYPERLIPIDEMIA HYPERTENSION Hx of celluliits, L foot Hospitalized for blurry vision in 6/09  Past Surgical History: Reviewed history from 09/16/2006 and no changes required. TAH-BSO (1996)  Family History: Reviewed history from 09/16/2006 and no changes required. Mother died in 1 -  DM, on hemodialysis, died of an MI (age 42). Father died in 2 - sepsis from pneumonia? 29 brothers, 4 sisters.  1 sister died of an MI at 28. 1 brother died of an MI at 34, 1 brother died of PNA at 17, 1 brother died of diabetes complications.  Social History: Reviewed history from 07/01/2007 and no changes required. Married x 42 yrs.  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 Never smoked. Doesn't drink or use drugs.  Physical Exam  General:  alert, well-developed, well-nourished, and well-hydrated.   Head:  normocephalic and atraumatic.   Eyes:  vision grossly intact.  PERRLA. EOMI.  Sclerae anicteric. Conjunctivae without injection or pallor. Mouth:   pharynx pink and moist.   Neck:  supple.  Palpable left submandibular LN approx 3cm x 2 cm, non-tender, well-circumscribed, and freely mobile.  No other palpable masses or LAD. Lungs:  normal respiratory effort, mildly decreases BS bilaterally.  Lungs are clear to asculation with good air movement. Heart:  normal rate, regular rhythm, no murmur, no gallop, and no rub.   Abdomen:  soft, non-tender, normal bowel sounds, no distention, and no masses.   Extremities:  No clubbing, cyanosis, or edema. Neurologic:  alert & oriented X3.  No focal deficit Skin:  turgor normal, color normal, and no rashes.   Cervical Nodes:  no anterior cervical adenopathy and no posterior cervical adenopathy.   Axillary Nodes:  no R axillary adenopathy and no L axillary adenopathy.   Psych:  Oriented X3, memory intact for recent and remote, normally interactive, not anxious appearing, and not depressed appearing.   Poor eye contact.  Diabetes Management Exam:    Foot Exam (with socks and/or shoes not present):       Sensory-Monofilament:          Left foot: normal          Right foot: normal   Impression & Recommendations:  Problem # 1:  DIABETES MELLITUS, TYPE II (ICD-250.00) Pt has not been taking any medications for control of her DM2 for > 1year.  As she has demonstrated problems with adherence to a medication plan in the past, will start her on therapy slowly.  Will begin with re-starting meformin and slowly titrating the dose from 500mg  once daily to 1000mg  two times a day over the next few weeks.  This should help to minimize side effects and increase the likelyhood that pt will regularly take the med.   With her current A1c of 12.2, she will not achieve a goal A1c of 7.0 with metformin alone.  This is anticipated in her plan and accepted as DM2 is a chronic condition and she has been poorly controlled for an extended period of time.  At her next appointment, will review her CBGs and add glyburide to her regimen  in a combo pill with metformin.  That will help to further lower her A1c, but to reach goal she will also need to make dietary and lifestyle changes or transition to insulin therapy.  SHe likely qualifies for immediate transition to insulin, however I do not believe that is the best option for Ms. Timmers at this time.  Will check urine microalbumin/cr today. Will ensure she is sent home with a new glucometer so she is able to check her CBGs at home; pt to meet with Barnabas Harries for meter selection and education today. Pt has appt with optho tomorrow.   The following medications were removed from the  medication list:    Glucovance 2.5-500 Mg Tabs (Glyburide-metformin) .Marland Kitchen... Take 1 tablet by mouth two times a day Her updated medication list for this problem includes:    Lisinopril 10 Mg Tabs (Lisinopril) .Marland Kitchen... Take 1 tablet by mouth once a day    Metformin Hcl 500 Mg Tabs (Metformin hcl) .Marland Kitchen... 1 pill po with dinner x 7 days, then 1 pill bid  x 7 days, then 1 pill  qam and 2 with dinner x 7 days, then 2 pills bid  Orders: T- Capillary Blood Glucose [id number]) T-Hgb A1C (in-house) [id number]) T-Urine Microalbumin w/creat. ratio [id number])  Problem # 2:  HYPERTENSION (ICD-401.9) BP is fairly stable off of medical therapy.  Pt desires improved control and is willing to take a daily pill.  Will start with Lisinopril 10mg  and reassess in 1 month.  Will check CMET today and repeat BMET at 1 mo f/u to evaulate electrolyte and renal status if she is regularly taking Lisinopril.  Her updated medication list for this problem includes:    Lisinopril 10 Mg Tabs (Lisinopril) .Marland Kitchen... Take 1 tablet by mouth once a day  Orders: T-Comprehensive Metabolic Panel ([id number])  Problem # 3:  HYPERLIPIDEMIA (ICD-272.4) Will check FLP today before starting medication.  If lipids ar elevated, will start pt on Pravachol as it is the least expensive statin available and cost is a concern for the pt.  The  following medications were removed from the medication list:    Lipitor 40 Mg Tabs (Atorvastatin calcium) .Marland Kitchen... Take 1 tab by mouth at bedtime  Orders: T-Comprehensive Metabolic Panel ([id number]) T-Lipid Profile [id number])  Labs Reviewed: SGOT: 12 (02/10/2008)   SGPT: 11 (02/10/2008)  Prior 10 Yr Risk Heart Disease: Not enough information (09/16/2006)   HDL:61 (02/10/2008), 59 (09/17/2006)  LDL:155 (02/10/2008), 112 (09/17/2006)  Chol:236 (02/10/2008), 194 (09/17/2006)  Trig:98 (02/10/2008), 114 (09/17/2006)  Problem # 4:  GERD (ICD-530.81) Pt currently symptomatic.  Will give her samples of prilosec to last for 1 month.  Advised her to take a 2 week course of therapy, during which she is sure to take the medicine every day.  After the 2 weeks are completed, pt will stop tx and assess for symptom recurrance.  If signs recur/persist, she has adequate samples to last until her 57mo f/u at which time a script for continued PPI therapy will be submitted to her pharmacy.  The following medications were removed from the medication list:    Acid Reducer Maximum Strength 20 Mg Tabs (Famotidine) .Marland Kitchen... 1 tab by mouth daily. Her updated medication list for this problem includes:    Prilosec 20 Mg Cpdr (Omeprazole) .Marland Kitchen... Take on pill by mouth once daily  Problem # 5:  GRIEF REACTION, ACUTE (ICD-309.0) Pts husband died approx 2 weeks ago.  Pt with expected symptom of acute,traumatic event.  She is doing well with lots of support from family and friends.  No suicidal ideation or thoughts of self-harm.  Reviewed resources available to pt and encourage her to call the clinic is she feels she needs help, has thoughts of self-harm, or feels the need to talk to psychologist.  Will reasses mood at visit next month to ensure she continues to adjust well.  Problem # 6:  LYMPHADENOPATHY ([id number]) Single palpable, soft, non-tender, well-circumscribed, freely mobile left submandibular LN.   This is most likely  a reactive LN from an acute viral illness.  Will observe for 3-4 weeks and check at 1 month f/u.  If this persists, pt may  need further evaluation and w/u with CBC and bx; bx is recommended if localized LAD is not resolved after 3-4 weeks of observation.  Complete Medication List: 1)  Lisinopril 10 Mg Tabs (Lisinopril) .... Take 1 tablet by mouth once a day 2)  Metformin Hcl 500 Mg Tabs (Metformin hcl) .Marland Kitchen.. 1 pill po with dinner x 7 days, then 1 pill bid  x 7 days, then 1 pill  qam and 2 with dinner x 7 days, then 2 pills bid 3)  Prilosec 20 Mg Cpdr (Omeprazole) .... Take on pill by mouth once daily  Patient Instructions: 1)  Please schedule a follow-up appointment in 1 month. 2)  At your next appointment we will review your blood sugars and adjust your diabetes medicine. 3)  Start taking meformin as directed for diabetes.  If you have any side effects or need to stop taking the medicine, call the clinic at [phone]. 4)  Start taking Lisinopril as directed.  This is a medicine for your blood pressure that will also help protect your kidneys. 5)  If you need more glucose test strips or lancets, call the clinic and we will get your refills. 6)  We will call you if your lab work is abnormal. Prescriptions: PRILOSEC 20 MG CPDR (OMEPRAZOLE) Take on pill by mouth once daily  #QS x 25mo x 0   Entered and Authorized by:   Eduardo Osier DO   Signed by:   Eduardo Osier DO on 05/18/2009   Method used:   Samples Given   RxID:   [id number] LISINOPRIL 10 MG TABS (LISINOPRIL) Take 1 tablet by mouth once a day  #31 x 3   Entered and Authorized by:   Eduardo Osier DO   Signed by:   Eduardo Osier DO on 05/18/2009   Method used:   Electronically to        [facility] [phone]* (retail)       [place], [place]  [id number]       Ph: [phone] or [phone]       Fax: [phone]   RxID:   [id number] METFORMIN HCL 500 MG TABS (METFORMIN  HCL) 1 pill po with dinner x 7 days, then 1 pill bid  x 7 days, then 1 pill  qam and 2 with dinner x 7 days, then 2 pills bid  #90 x 0   Entered and Authorized by:   Eduardo Osier DO   Signed by:   Eduardo Osier DO on 05/18/2009   Method used:   Electronically to        [facility] [phone]* (retail)       [place], [place]  [id number]       Ph: [phone] or [phone]       Fax: [phone]   RxID:   [id number]   Process Orders Check Orders Results:     [facility]: ABN not required for this insurance Tests Sent for requisitioning (May 18, 2009 12:10 PM):     05/18/2009: [facility] -- T-Comprehensive Metabolic Panel [id number] (signed)     05/18/2009: [facility] -- T-Lipid Profile [id number] (signed)     05/18/2009: [facility] -- T-Urine Microalbumin w/creat. ratio [[id number]] (signed)    Prevention & Chronic Care Immunizations  Influenza vaccine: Not documented   Influenza vaccine deferral: Deferred  (05/18/2009)    Tetanus booster: Not documented   Td booster deferral: Deferred  (05/18/2009)    Pneumococcal vaccine: Not documented  Colorectal Screening   Hemoccult: Not documented    Colonoscopy: Not documented  Other Screening   Pap smear: Not documented    Mammogram: Not documented   Smoking status: never  (05/18/2009)  Diabetes Mellitus   HgbA1C: 12.2  (05/18/2009)    Eye exam: Not documented   Diabetic eye exam action/deferral: Ophthalmology referral  (05/18/2009)    Foot exam: yes  (05/18/2009)   Foot exam action/deferral: Do today   High risk foot: Not documented   Foot care education: Not documented    Urine microalbumin/creatinine ratio: 351.3  (02/10/2008)   Urine microalbumin action/deferral: Ordered    Diabetes flowsheet reviewed?: Yes   Progress toward A1C goal: Unchanged  Lipids   Total  Cholesterol: 236  (02/10/2008)   LDL: 155  (02/10/2008)   LDL Direct: Not documented   HDL: 61  (02/10/2008)   Triglycerides: 98  (02/10/2008)    SGOT (AST): 12  (02/10/2008)   SGPT (ALT): 11  (02/10/2008) CMP ordered    Alkaline phosphatase: 105  (02/10/2008)   Total bilirubin: 0.3  (02/10/2008)    Lipid flowsheet reviewed?: Yes   Progress toward LDL goal: Unchanged  Hypertension   Last Blood Pressure: 141 / 87  (05/18/2009)   Serum creatinine: 0.76  (02/10/2008)   Serum potassium 4.8  (02/10/2008) CMP ordered     Hypertension flowsheet reviewed?: Yes   Progress toward BP goal: Unchanged  Self-Management Support :   Personal Goals (by the next clinic visit) :     Personal A1C goal: 10  (05/18/2009)     Personal blood pressure goal: 130/80  (05/18/2009)   Patient will work on the following items until the next clinic visit to reach self-care goals:     Medications and monitoring: examine my feet every day  (05/18/2009)     Eating: use fresh or frozen vegetables, eat foods that are low in salt, eat baked foods instead of fried foods, eat fruit for snacks and desserts, limit or avoid alcohol  (05/18/2009)     Activity: park at the far end of the parking lot  (05/18/2009)    Diabetes self-management support: Education handout, Resources for patients handout  (05/18/2009)   Diabetes education handout printed    Hypertension self-management support: Education handout, Resources for patients handout  (05/18/2009)   Hypertension education handout printed    Lipid self-management support: Education handout, Resources for patients handout  (05/18/2009)     Lipid education handout printed      Resource handout printed.   Nursing Instructions: Diabetic foot exam today     Laboratory Results   Blood Tests   Date/Time Received: May 18, 2009 10:44 AM  Date/Time Reported: Lenoria Farrier  May 18, 2009 10:44 AM   HGBA1C: 12.2%   (Normal Range: Non-Diabetic - 3-6%    Control Diabetic - 6-8%) CBG Random:: 301mg /dL

## 2010-02-28 NOTE — Letter (Signed)
Summary: [facility] Instructions   Gastroenterology  [place], [place] [address]   Phone: [phone]  Fax: [phone]       [name]    Sep 01, 1958    MRN: [id number]        Procedure Day /Date:  Tuesday 10/11/2009     Arrival Time: 7:30 am      Procedure Time: 8:30 am     Location of Procedure:                    _x _  [place] (4th Floor)                        [place]   Starting 5 days prior to your procedure Thursday 9/8 do not eat nuts, seeds, popcorn, corn, beans, peas,  salads, or any raw vegetables.  Do not take any fiber supplements (e.g. Metamucil, Citrucel, and Benefiber).  THE DAY BEFORE YOUR PROCEDURE         DATE: Monday 9/12  1.  Drink clear liquids the entire day-NO SOLID FOOD  2.  Do not drink anything colored red or purple.  Avoid juices with pulp.  No orange juice.  3.  Drink at least 64 oz. (8 glasses) of fluid/clear liquids during the day to prevent dehydration and help the prep work efficiently.  CLEAR LIQUIDS INCLUDE: Water Jello Ice Popsicles Tea (sugar ok, no milk/cream) Powdered fruit flavored drinks Coffee (sugar ok, no milk/cream) Gatorade Juice: apple, white grape, white cranberry  Lemonade Clear bullion, consomm, broth Carbonated beverages (any kind) Strained chicken noodle soup Hard Candy                             4.  In the morning, mix first dose of MoviPrep solution:    Empty 1 Pouch A and 1 Pouch B into the disposable container    Add lukewarm drinking water to the top line of the container. Mix to dissolve    Refrigerate (mixed solution should be used within 24 hrs)  5.  Begin drinking the prep at 5:00 p.m. The MoviPrep container is divided by 4 marks.   Every 15 minutes drink the solution down to the next mark (approximately 8 oz) until the full liter is complete.   6.  Follow completed prep with 16 oz of clear liquid of your choice (Nothing  red or purple).  Continue to drink clear liquids until bedtime.  7.  Before going to bed, mix second dose of MoviPrep solution:    Empty 1 Pouch A and 1 Pouch B into the disposable container    Add lukewarm drinking water to the top line of the container. Mix to dissolve    Refrigerate  THE DAY OF YOUR PROCEDURE      DATE: Tuesday 9/13  Beginning at 3:30 a.m. (5 hours before procedure):         1. Every 15 minutes, drink the solution down to the next mark (approx 8 oz) until the full liter is complete.  2. Follow completed prep with 16 oz. of clear liquid of your choice.    3. You may drink clear liquids until 6:30 am (2 HOURS BEFORE PROCEDURE).   MEDICATION INSTRUCTIONS  Unless otherwise instructed, you should take regular prescription medications with a small sip of water   as early as possible the morning of  your procedure.  Diabetic patients - see separate instructions.         OTHER INSTRUCTIONS  You will need a responsible adult at least 60 years of age to accompany you and drive you home.   This person must remain in the waiting room during your procedure.  Wear loose fitting clothing that is easily removed.  Leave jewelry and other valuables at home.  However, you may wish to bring a book to read or  an iPod/MP3 player to listen to music as you wait for your procedure to start.  Remove all body piercing jewelry and leave at home.  Total time from sign-in until discharge is approximately 2-3 hours.  You should go home directly after your procedure and rest.  You can resume normal activities the  day after your procedure.  The day of your procedure you should not:   Drive   Make legal decisions   Operate machinery   Drink alcohol   Return to work  You will receive specific instructions about eating, activities and medications before you leave.    The above instructions have been reviewed and explained to me by   Cornelia Copa RN  September 27, 2009  8:49 AM     I fully understand and can verbalize these instructions _____________________________ Date _________

## 2010-02-28 NOTE — Progress Notes (Signed)
Summary: Appoinment   Phone Note Outgoing Call   Call placed by: Sander Nephew RN,  April 27, 2009 10:03 AM Call placed to: Patient Summary of Call: Call to pt's home and work numbers.  Home phone disconnected -work phone-? pt unavailable.  Letter sent to pt to call and schedule an appointment.  Sander Nephew RN  April 27, 2009 10:09 AM  Initial call taken by: Sander Nephew RN,  April 27, 2009 10:09 AM

## 2010-04-13 LAB — GLUCOSE, CAPILLARY
Glucose-Capillary: 225 mg/dL — ABNORMAL HIGH (ref 70–99)
Glucose-Capillary: 248 mg/dL — ABNORMAL HIGH (ref 70–99)

## 2010-04-15 LAB — GLUCOSE, CAPILLARY: Glucose-Capillary: 387 mg/dL — ABNORMAL HIGH (ref 70–99)

## 2010-04-18 LAB — GLUCOSE, CAPILLARY: Glucose-Capillary: 301 mg/dL — ABNORMAL HIGH (ref 70–99)

## 2010-05-15 LAB — GLUCOSE, CAPILLARY: Glucose-Capillary: 289 mg/dL — ABNORMAL HIGH (ref 70–99)

## 2010-05-23 ENCOUNTER — Encounter: Payer: Self-pay | Admitting: Internal Medicine

## 2010-06-13 NOTE — Discharge Summary (Signed)
NAMEANGELLY, NUNGARAY             ACCOUNT NO.:  [id number]   MEDICAL RECORD NO.:  [id number]          PATIENT TYPE:  OBV   LOCATION:  [id number]                         FACILITY:  [place]   PHYSICIAN:  Luane School, M.D.   DATE OF BIRTH:  Jul 22, 1950   DATE OF ADMISSION:  07/01/2007  DATE OF DISCHARGE:  07/03/2007                               DISCHARGE SUMMARY   DISCHARGE DIAGNOSES:  1. 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  2. Hypertension.  3. Diabetes.  4. Hyperlipidemia.   DISCHARGE MEDICATIONS:  1. Glyburide/metformin 5/500 1 tablet twice daily.  2. Lisinopril 10 mg daily.  3. Lipitor 40 mg once daily.  4. Prilosec 1 tablet 20 mg, 1 tablet by mouth once daily   DISPOSITION AND FOLLOWUP:  Ms. Bonanni will come to see Dr. Venia Carbon at  the outpatient clinic at [facility] on July 17, 2007, at 11:00  a.m.  Ms. Ranum was admitted for hypoglycemic episodes.  This was  thought to be due to her diabetes medications mainly glyburide.  Her  glyburide/metformin 500/500 has been adjusted from 2 tablets twice daily  to 1 tablet twice daily.  She had no evidence of a TIA or 2-D echo  evidence of thrombus in her heart that would explain the  blurry vision.  She has been instructed on daily monitoring of her blood sugars, which  she has not done in the past.  This will need to be followed in the  outpatient visit.  The patient also was noted to have black stools.  At  the time of admission, her hemoccult was negative.  Her hemoglobin has  ranged between 11.8 and 12.2.  As she is more than 60 years old, she  will need a followup colonoscopy scheduled for her as an outpatient.   IMPORTANT DIAGNOSTIC STUDIES:  Carotid Doppler studies were done on July 02, 2007, the preliminary results which indicate no ICA stenosis  bilaterally.  A 2-D echo was done on July 02, 2007, which showed no  evidence of cardiac source for embolism,and probably normal ejection  fraction. No endocardium  seen, cannot rule out septal hypokinesis.  Overall left ventricular function normal and the aortic valve was mildly  calcified.   CONSULTATIONS:  None.   HISTORY OF PRESENT ILLNESS:  Ms. Turco is a 60 year old female with a  history of diabetes, hypertension, and hyperlipidemia presented to the  clinic complaining of feeling lightheaded, right eye blurry vision, and  bilateral lower extremity swelling.  She was on her way home from work  on Friday night and started having blurry vision.  She also endorsed  tingling at her fingertips.  She also complained of black stools for the  last 3 weeks.   ALLERGIES:  She has no known drug allergies.   PAST MEDICAL HISTORY:  1. Significant for type 2 diabetes.  2. Hyperlipidemia.  3. GERD.  4. Degenerative disk disease.  5. History of hysterectomy 12 years ago.  6. Cellulitis of the left foot.   MEDICATIONS:  1. Glyburide.  2. Metformin 500/5 mg 2, tablets b.i.d.  3. Prevacid 20 mg daily.  4.  Lipitor 40 mg daily.  5. Lisinopril 10 mg daily.   SUBSTANCE AND SOCIAL HISTORY:  Never smoked, does not drink.  Works at  [employer].   PERTINENT PHYSICAL EXAMINATION:  VITAL SIGNS:  Temperature 97, blood  pressure 148/86, and pulse 98; examination, not in acute distress.  HEENT:  Pupils were equal and reactive.  Extraocular movements intact.  Head and ENT examination:  Mucous membranes are normal.  No erythema.  NECK:  Supple.  No JVD.  CVS:  S1 and S2 present, regular rate and rhythm.  GI:  Soft, mild periumbilical tenderness.  CNS:  Alert and oriented x3.  Cranial nerves 2 through 12 intact.  Motor  power 5/5 in all limbs.  Sensory system intact.  Cerebellar signs  intact.   ADMISSION LABS:  Sodium 139, potassium 3.8, chloride 103, bicarb 27, BUN  13, creatinine 0.8, and glucose 53, anion gap of 9, bilirubin of 0.4,  alk phos 77, SGOT 38, SGPT 29, protein 7.2, albumin 3.5, and calcium  9.6.   HOSPITAL COURSE AND PROBLEMS:  1.  Hypoglycemia.  The patient was noted to be hypoglycemic in the      outpatient clinic and was admitted for observation and followup.      She had been taking glyburide/metformin 5/500 mg 2 tablets b.i.d.      for her diabetes.  She also complained of blurry vision.  The      diabetes medications were stopped during this hospitalization.  She      was closely followed up for further drop in her blood sugars.  She      did not have any hypoglycemic events after admission.  Her blood      sugars remained fairly stable and at the time of discharge were      between 106 and 138.  The other possible causes for the      hypoglycemia included hypopituitarism, adrenal insufficiency,      glucagon deficiency, liver failure, and alcoholism, but these were      very less likely.  She also had carotid Doppler studies for the      blurry vision, which were negative.  She also had a 2-D echo, which      was essentially negative for thrombus.  The blurry vision      completely resolved during hospitalization.  Her hypoglycemic      events were thought to be due to high dose of her sulfonylurea.      Her medications were decreased to glyburide/metformin 5/500 mg 1      tablet b.i.d.  The patient was also extensively counseled on close      monitoring of her blood sugars.  She was provided a prescription      for a Glucometer and strips.  She will closely follow them at home.  2. Black stools and abdominal pain.  She had an acute abdominal      series, which was negative.  Lipase was negative.  She had a      Hemoccult test of her stool done, which was negative.  Her      hemoglobin remained fairly stable between 12-12.2.  3. Diabetes.  Her hemoglobin A1c during this hospitalization was noted      to be 7.7.  She was counseled on diet and adherence to medications.      Her diabetes medications were halved.  She will continue to monitor      her blood sugars twice daily.  4. Hyperlipidemia.  She will  continue statin.  5. Hypertension, blood pressure was stable.  She will continue her      lisinopril.   DISCHARGE LABS:  As follows sodium 140, potassium 4.3, chloride 107,  bicarb 30, BUN 11, creatinine 0.75, hemoglobin of 11.8, platelets 208,  WBC 6.7, and hemoglobin A1c 7.7.  Discharge vitals:  Blood pressure  146/82, pulse 86, respiratory rate 16, and temperature 97.6.      Alver Fisher, M.D.  Electronically Signed      Luane School, M.D.  Electronically Signed    YB/MEDQ  D:  07/03/2007  T:  07/04/2007  Job:  [id number]

## 2010-07-27 ENCOUNTER — Ambulatory Visit (INDEPENDENT_AMBULATORY_CARE_PROVIDER_SITE_OTHER): Payer: Self-pay | Admitting: Internal Medicine

## 2010-07-27 ENCOUNTER — Encounter: Payer: Self-pay | Admitting: Internal Medicine

## 2010-07-27 VITALS — BP 132/79 | HR 98 | Temp 97.9°F | Ht 63.0 in | Wt 204.2 lb

## 2010-07-27 DIAGNOSIS — H538 Other visual disturbances: Secondary | ICD-10-CM

## 2010-07-27 DIAGNOSIS — E119 Type 2 diabetes mellitus without complications: Secondary | ICD-10-CM

## 2010-07-27 DIAGNOSIS — E785 Hyperlipidemia, unspecified: Secondary | ICD-10-CM

## 2010-07-27 DIAGNOSIS — K219 Gastro-esophageal reflux disease without esophagitis: Secondary | ICD-10-CM

## 2010-07-27 DIAGNOSIS — R809 Proteinuria, unspecified: Secondary | ICD-10-CM

## 2010-07-27 DIAGNOSIS — I10 Essential (primary) hypertension: Secondary | ICD-10-CM

## 2010-07-27 LAB — LIPID PANEL
Cholesterol: 255 mg/dL — ABNORMAL HIGH (ref 0–200)
HDL: 61 mg/dL (ref >39)
Total CHOL/HDL Ratio: 4.2 Ratio

## 2010-07-27 LAB — COMPREHENSIVE METABOLIC PANEL
ALT: 9 U/L (ref 0–35)
AST: 10 U/L (ref 0–37)
Alkaline Phosphatase: 122 U/L — ABNORMAL HIGH (ref 39–117)
BUN: 14 mg/dL (ref 6–23)
Chloride: 98 mEq/L (ref 96–112)
Total Bilirubin: 0.2 mg/dL — ABNORMAL LOW (ref 0.3–1.2)

## 2010-07-27 LAB — CBC
HCT: 37.7 % (ref 36.0–46.0)
MCHC: 32.9 g/dL (ref 30.0–36.0)
WBC: 5.6 10*3/uL (ref 4.0–10.5)

## 2010-07-27 MED ORDER — METFORMIN HCL 850 MG PO TABS: 850.0000 mg | ORAL_TABLET | Freq: Two times a day (BID) | ORAL | Status: DC

## 2010-07-27 MED ORDER — GLIPIZIDE 10 MG PO TABS: 10.0000 mg | ORAL_TABLET | Freq: Every day | ORAL | Status: DC

## 2010-07-27 MED ORDER — LISINOPRIL 10 MG PO TABS: 10.0000 mg | ORAL_TABLET | Freq: Every day | ORAL | Status: DC

## 2010-07-28 LAB — MICROALBUMIN / CREATININE URINE RATIO: Microalb Creat Ratio: 228 mg/g — ABNORMAL HIGH (ref 0.0–30.0)

## 2010-07-30 NOTE — Assessment & Plan Note (Signed)
Patient's blood pressure is elevated. Patient has been not taking any medication. I will  restart lisinopril especially in the setting of diabetes and history of microalbuminuria. We'll check complete metabolic panel and microalbuminuria.

## 2010-07-30 NOTE — Assessment & Plan Note (Signed)
Patient has history of microalbuminuria. Has been not taking any medication. We will recheck today.

## 2010-07-30 NOTE — Assessment & Plan Note (Signed)
Patient has not been taking any medication. Hemoglobin A1c is about 14. She needs insulin but cannot afford any medication at this point. She will try to apply for disability/Medicare. Will also try to apply for orange card. I was called with metformin and glipizide which are free at [facility] for a month supply. Patient noted she is able to afford $8 a month. We will have the patient come in in 2 weeks a followup on CBGs. Patient may need changes in management at that time.

## 2010-07-30 NOTE — Assessment & Plan Note (Signed)
Patient has history of blurred vision and noted that she does have trouble seen. She does not have any insurance and no orange card at this point. After receiving her orange card or any kind of insurance will refer the patient soon as possible to an ophthalmologist. If this is taking too long consider to per the patient to a facility who does not need any insurance.

## 2010-07-30 NOTE — Assessment & Plan Note (Signed)
We will check fasting lipid panel today.

## 2010-07-30 NOTE — Progress Notes (Signed)
  Subjective:    Patient ID: [name], female    DOB: 01-Feb-1950, 60 y.o.   MRN: [id number]  HPI  This is a 60 year old female with past medical history significant for Diabetes, Hypertension who presented to the clinic for a regular visit. Patient has not been in the clinic since 1 year and has not been taking any medication due to financial problems. She would like to get restarted on medication. Patient does not have an orange card.   Review of Systems  Constitutional: Negative for fever and chills.  HENT: Negative for neck pain.   Eyes: Positive for visual disturbance.  Respiratory: Negative for chest tightness and shortness of breath.   Cardiovascular: Negative for chest pain, palpitations and leg swelling.  Gastrointestinal: Negative for nausea, abdominal pain, diarrhea, constipation, blood in stool and abdominal distention.  Genitourinary: Negative for difficulty urinating, vaginal pain and menstrual problem.  Musculoskeletal: Negative for arthralgias.  Neurological: Negative for dizziness, weakness and light-headedness.       Objective:   Physical Exam  Constitutional: She is oriented to person, place, and time. She appears well-developed and well-nourished.  HENT:  Head: Normocephalic.  Neck: Neck supple.  Cardiovascular: Normal rate, regular rhythm and normal heart sounds.   Pulmonary/Chest: Effort normal and breath sounds normal.  Abdominal: Soft. Bowel sounds are normal. She exhibits no distension.  Musculoskeletal: Normal range of motion.  Neurological: She is alert and oriented to person, place, and time.  Skin: No rash noted.          Assessment & Plan:

## 2010-08-07 ENCOUNTER — Other Ambulatory Visit: Payer: Self-pay | Admitting: Internal Medicine

## 2010-08-07 DIAGNOSIS — E785 Hyperlipidemia, unspecified: Secondary | ICD-10-CM

## 2010-08-07 MED ORDER — SIMVASTATIN 40 MG PO TABS: 40.0000 mg | ORAL_TABLET | Freq: Every day | ORAL | Status: DC

## 2010-08-07 NOTE — Assessment & Plan Note (Signed)
Will start Simvastatin 40 mg. Need LFT checked in 6 weeks and follow up Lipid panel.

## 2010-08-09 ENCOUNTER — Telehealth: Payer: Self-pay | Admitting: *Deleted

## 2010-08-09 NOTE — Telephone Encounter (Signed)
Message copied by Ebbie Latus on Wed Aug 09, 2010 10:17 AM ------      Message from: Rosalia Hammers      Created: Mon Aug 07, 2010 10:10 AM      Regarding: Starting Statin       Can you please call the Ms Welton. I was not able to contact her. She needs to be started on Simvastatin. No pharmacy is in our records at this point, since patient was not sure which she wanted to use . Please let me know if there is any problem            Thanks,      Rosalia Hammers

## 2010-08-09 NOTE — Telephone Encounter (Signed)
Pt called- no answer; message left to call me at the clinic.

## 2010-08-10 ENCOUNTER — Ambulatory Visit (INDEPENDENT_AMBULATORY_CARE_PROVIDER_SITE_OTHER): Payer: Self-pay | Admitting: Internal Medicine

## 2010-08-10 ENCOUNTER — Encounter: Payer: Self-pay | Admitting: Internal Medicine

## 2010-08-10 ENCOUNTER — Ambulatory Visit: Payer: Self-pay | Admitting: Dietician

## 2010-08-10 DIAGNOSIS — E119 Type 2 diabetes mellitus without complications: Secondary | ICD-10-CM

## 2010-08-10 DIAGNOSIS — I10 Essential (primary) hypertension: Secondary | ICD-10-CM

## 2010-08-10 MED ORDER — INSULIN NPH ISOPHANE & REGULAR (70-30) 100 UNIT/ML ~~LOC~~ SUSP: SUBCUTANEOUS | Status: DC

## 2010-08-10 MED ORDER — GLUCOSE BLOOD VI STRP: ORAL_STRIP | Status: DC

## 2010-08-10 NOTE — Assessment & Plan Note (Signed)
Adequately controlled. Will continue lisinopril 10 mg by mouth daily.

## 2010-08-10 NOTE — Progress Notes (Signed)
History of present illness: [name] is a 60 year old woman with past medical history uncontrolled diabetes, hypertension presents today for followup. She was recently seen on 07/27/2010 for uncontrolled diabetes and was found to have a hemoglobin A1c greater than 14 and a CBG 379.  Of note she has not been taking her medication in the past 1- 2 years.  Since office visit, patient has been taking her medication as prescribed and reports no side effects. She is tolerating the medication well and is still in much better. She denies any polyuria polydipsia, numbness or tingling, shortness of breath or chest pain. Patient's states that she is willing to try insulin after I explained to her that oral medication will not lower her hemoglobin A1c that much. She also states that she met with Barnabas Harries during her last office visit.  No other complaints today.  ROS: As per history of present illness  PE: General: alert, well-developed, and cooperative to examination.  Lungs: normal respiratory effort, no accessory muscle use, normal breath sounds, no crackles, and no wheezes. Heart: normal rate, regular rhythm, no murmur, no gallop, and no rub.  Abdomen: soft, non-tender, normal bowel sounds, no distention, no guarding, no rebound tenderness, no hepatomegaly, and no splenomegaly.  Msk: no joint swelling, no joint warmth, and no redness over joints.  Pulses: 2+ DP/PT pulses bilaterally Extremities: No cyanosis, clubbing, +1-2 pitting edema Neurologic: alert & oriented X3, cranial nerves II-XII intact, strength normal in all extremities, sensation intact to light touch, and gait normal.

## 2010-08-10 NOTE — Telephone Encounter (Signed)
Pt had an appt today w/Dr. Silverio Decamp; I was able to inform pt of new rx for Simvastatin 40mg  daily at bedtime per Dr. Newt Lukes. I also call in rx to [facility] per pt.

## 2010-08-10 NOTE — Assessment & Plan Note (Signed)
CBG today was 290.  Poorly controlled. Hemoglobin A1c in June 2012 was greater than 14. Patient has not been on medication in the past one to 2 years. She was started on glipizide and metformin during last office visit. She reports no medication side effects and is tolerating it well.  The palpation that oral medication will not lower her hemoglobin A1c much and that she will definitely need insulin for better control and to prevent complication and sequela of diabetes. Patient did meet with Barnabas Harries during last office visit in June of 2012 for recommendations of proper diet. Today she agrees to try insulin. -Will start patient on Humulin 70/30, based on patient weight of 95 kg, insulin requirement would be 0.5 units per kilogram. She would need a total of 47 units daily.  Patient was instructed to inject 25 units of insulin until his skin but he minutes before breakfast, and 22 units of insulin before dinner.  She was also instructed to check her blood sugar once daily before meal time 4 days then check 4 times daily for 4 days and followup with Dr. Silverio Decamp in one week. -Will continue metformin 850mg  twice a day (this is likely the dose that is on [facility] for $4 plan).  Her creatinine is stable at 1.01; therefore, she can tolerate metformin at this time. -I will discontinue her glipizide at next office visit because it is unlikely given her much benefits -Patient was given instruction on glucose meter, strips, insulin by me as well as Barnabas Harries. We asked patient to buy her insulin and strips at [facility] since she does not have orange card at this time. She was also instructed to meet with Clarice Pole for orange card application as well as the MAP -Continue diet and exercise

## 2010-08-10 NOTE — Patient Instructions (Signed)
Please follow up with Dr. Silverio Decamp in 1 week You can buy your insulin and meter and strips from [facility]- the brand is [employer] which is the cheapest. Please start taking insulin as prescribed, 25 unit of insulin 30 minutes before breakfast, and 22 units of insulin 30 minutes before dinner.  Test your blood sugar once daily x 4 days, then 4 times daily before meals  X 4 days, and bring your meter to appointment next week so I can see how your sugars are doing.    Make sure you meet with Clarice Pole to apply for orange card and Medication Assistance Program Continue your other medications.

## 2010-08-17 ENCOUNTER — Encounter: Payer: Self-pay | Admitting: Internal Medicine

## 2010-08-17 ENCOUNTER — Ambulatory Visit (INDEPENDENT_AMBULATORY_CARE_PROVIDER_SITE_OTHER): Payer: Self-pay | Admitting: Internal Medicine

## 2010-08-17 VITALS — BP 145/85 | HR 85 | Temp 97.7°F | Wt 207.8 lb

## 2010-08-17 DIAGNOSIS — I10 Essential (primary) hypertension: Secondary | ICD-10-CM

## 2010-08-17 DIAGNOSIS — Z23 Encounter for immunization: Secondary | ICD-10-CM

## 2010-08-17 DIAGNOSIS — R809 Proteinuria, unspecified: Secondary | ICD-10-CM

## 2010-08-17 DIAGNOSIS — E119 Type 2 diabetes mellitus without complications: Secondary | ICD-10-CM

## 2010-08-17 MED ORDER — LISINOPRIL 10 MG PO TABS: 20.0000 mg | ORAL_TABLET | Freq: Every day | ORAL | Status: DC

## 2010-08-17 MED ORDER — LISINOPRIL 20 MG PO TABS: 20.0000 mg | ORAL_TABLET | Freq: Every day | ORAL | Status: DC

## 2010-08-17 MED ORDER — INSULIN NPH ISOPHANE & REGULAR (70-30) 100 UNIT/ML ~~LOC~~ SUSP: SUBCUTANEOUS | Status: DC

## 2010-08-17 NOTE — Assessment & Plan Note (Addendum)
Better control. Glucose meter shows range of 47-287 with 2 episodes of low glucose levels.   -Will decrease 3 units in the morning and 2 units at night.  She will inject 22 units in the morning and 20 units at night of Novolin 70/30 -Will stop glipizide today given her episodes of low glucose level and that it is unlikely to give her much benefit -Instruct patient to continue diet and exercise, and to bring her meter to the next office visit -Will followup in 2 months

## 2010-08-17 NOTE — Patient Instructions (Signed)
Please decrease insulin to 22units in the morning and 20 units at night STOP taking Glipizide Increase your Lisinopril to 20mg  once daily Follow up in 2 months

## 2010-08-17 NOTE — Assessment & Plan Note (Signed)
Not at target. Would like for her blood pressure to be less than 130/80.  Patient does have elevated urine microalbumin/creatinine ratio in June 2012. Patient is currently ACE inhibitor. -Will increase lisinopril to 20 mg by mouth daily for better blood pressure control -Reassess her blood pressure in 2 months

## 2010-08-17 NOTE — Progress Notes (Signed)
History of present illness: [name] is a 60 year old woman with diabetes who was recently started on insulin presents today for followup. She has been compliant with her medication injecting 25 units in the morning and 22 units at night up Novolin 70/30.  She reports feeling much better and is very please with her glucose control and has no other complaints today.  She brought her meter today and the ranges were 47-287.  She had 2 episodes of lows but reported that it was before meal and she denies any tremors, dizziness, or any other symptoms of hypoglycemia.    ROS: As per history of present illness  Physical examination:  General: alert, well-developed, and cooperative to examination.  Lungs: normal respiratory effort, no accessory muscle use, normal breath sounds, no crackles, and no wheezes. Heart: normal rate, regular rhythm, no murmur, no gallop, and no rub.  Abdomen: soft, non-tender, normal bowel sounds, no distention, no guarding, no rebound tenderness Msk: no joint swelling, no joint warmth, and no redness over joints.  Pulses: 2+ DP/PT pulses bilaterally Extremities: No cyanosis, clubbing, edema Neurologic: alert & oriented X3, cranial nerves II-XII intact, strength normal in all extremities, sensation intact to light touch, and gait normal.

## 2010-10-26 LAB — COMPREHENSIVE METABOLIC PANEL
ALT: 29
AST: 38 — ABNORMAL HIGH
Alkaline Phosphatase: 77
CO2: 27
Chloride: 103
Creatinine, Ser: 0.8
GFR calc Af Amer: >60
GFR calc non Af Amer: >60
Potassium: 3.8
Total Bilirubin: 0.4

## 2010-10-26 LAB — CBC
HCT: 35 — ABNORMAL LOW
HCT: 37.1
Hemoglobin: 11.8 — ABNORMAL LOW
Hemoglobin: 12.2
MCHC: 32.8
MCHC: 33.8
MCV: 87.4
Platelets: 208
Platelets: 213
RDW: 14.7
RDW: 14.8

## 2010-10-26 LAB — URINE CULTURE
Colony Count: 2000
Special Requests: NEGATIVE

## 2010-10-26 LAB — LIPASE, BLOOD: Lipase: 22

## 2010-10-26 LAB — LIPID PANEL
Cholesterol: 153
LDL Cholesterol: 82
Triglycerides: 95
VLDL: 19

## 2010-10-26 LAB — URINALYSIS, MICROSCOPIC ONLY
Bilirubin Urine: NEGATIVE
Ketones, ur: NEGATIVE
Nitrite: NEGATIVE
Protein, ur: NEGATIVE
pH: 5.5

## 2010-10-26 LAB — HEMOGLOBIN A1C: Hgb A1c MFr Bld: 7.7 — ABNORMAL HIGH

## 2010-10-26 LAB — DIFFERENTIAL
Basophils Absolute: 0
Basophils Relative: 0
Eosinophils Absolute: 0.1
Eosinophils Relative: 1
Monocytes Absolute: 0.6

## 2010-10-26 LAB — BASIC METABOLIC PANEL
BUN: 11
CO2: 30
Glucose, Bld: 132 — ABNORMAL HIGH
Potassium: 4.3
Sodium: 140

## 2010-10-26 LAB — PROTIME-INR: INR: 0.9

## 2010-10-26 LAB — AMYLASE: Amylase: 319 — ABNORMAL HIGH

## 2010-10-26 LAB — MICROALBUMIN / CREATININE URINE RATIO: Microalb Creat Ratio: 171.4 — ABNORMAL HIGH

## 2010-11-07 LAB — URINALYSIS, ROUTINE W REFLEX MICROSCOPIC
Bilirubin Urine: NEGATIVE
Hgb urine dipstick: NEGATIVE
Ketones, ur: NEGATIVE
Protein, ur: 30 — AB
Urobilinogen, UA: 1

## 2010-11-07 LAB — URINE MICROSCOPIC-ADD ON

## 2011-02-02 ENCOUNTER — Encounter: Payer: Self-pay | Admitting: Internal Medicine

## 2011-04-26 ENCOUNTER — Encounter: Payer: Self-pay | Admitting: Internal Medicine

## 2011-04-26 ENCOUNTER — Ambulatory Visit (INDEPENDENT_AMBULATORY_CARE_PROVIDER_SITE_OTHER): Payer: Self-pay | Admitting: Internal Medicine

## 2011-04-26 VITALS — BP 134/83 | HR 88 | Temp 97.6°F | Resp 20 | Ht 62.0 in | Wt 199.7 lb

## 2011-04-26 DIAGNOSIS — I10 Essential (primary) hypertension: Secondary | ICD-10-CM

## 2011-04-26 DIAGNOSIS — E785 Hyperlipidemia, unspecified: Secondary | ICD-10-CM

## 2011-04-26 DIAGNOSIS — E119 Type 2 diabetes mellitus without complications: Secondary | ICD-10-CM

## 2011-04-26 LAB — GLUCOSE, CAPILLARY: Glucose-Capillary: 315 mg/dL — ABNORMAL HIGH (ref 70–99)

## 2011-04-26 MED ORDER — LISINOPRIL 20 MG PO TABS: 20.0000 mg | ORAL_TABLET | Freq: Every day | ORAL | Status: DC

## 2011-04-26 MED ORDER — METFORMIN HCL 1000 MG PO TABS: 1000.0000 mg | ORAL_TABLET | Freq: Two times a day (BID) | ORAL | Status: DC

## 2011-04-26 MED ORDER — SIMVASTATIN 20 MG PO TABS: 20.0000 mg | ORAL_TABLET | Freq: Every day | ORAL | Status: DC

## 2011-04-26 NOTE — Progress Notes (Signed)
Subjective:     Patient ID: [name], female   DOB: [birth year], 61 y.o.   MRN: [id number]  HPI patient is a 61 year old female presenting today for routine followup.  She has not been taking any medications for the past 4 months and has let her orange card lapse.  She states she missed her last  appointment w/ Dr. Silverio Decamp because she got out of work late.  She states she ran out of refills so was unable to get her medicine.  She previously had health insurance and lost this 2 years ago with the death of her husband.    She states she has enough money to afford a few medications today.  She denies any adverse effects from her former medications.  She states she has a meter but does not have test strips.    She has no other concerns or complaints today   Review of Systems Constitutional: Negative for fever, chills, diaphoresis, activity change, appetite change, fatigue and unexpected weight change.  HENT: Negative for hearing loss, congestion and neck stiffness.   Eyes: Negative for photophobia, pain and visual disturbance.  Respiratory: Negative for cough, chest tightness, shortness of breath and wheezing.   Cardiovascular: Negative for chest pain and palpitations.  Gastrointestinal: Negative for abdominal pain, blood in stool and anal bleeding.  Genitourinary: Negative for dysuria, hematuria and difficulty urinating.  Musculoskeletal: Negative for joint swelling.  Neurological: Negative for dizziness, syncope, speech difficulty, weakness, numbness and headaches.      Objective:   Physical Exam VItal signs reviewed and stable. GEN: No apparent distress.  Alert and oriented x 3.  Pleasant, conversant, and cooperative to exam. HEENT: head is autraumatic and normocephalic.  Neck is supple without palpable masses or lymphadenopathy.  No JVD or carotid bruits.  Vision intact.  EOMI.  PERRLA.  Sclerae anicteric.  Conjunctivae without pallor or injection. Mucous membranes are moist.  Oropharynx  is without erythema, exudates, or other abnormal lesions.   RESP:  Lungs are clear to ascultation bilaterally with good air movement.  No wheezes, ronchi, or rubs. CARDIOVASCULAR: regular rate, normal rhythm.  Clear S1, S2, no murmurs, gallops, or rubs. ABDOMEN: soft, non-tender, non-distended.  Bowels sounds present in all quadrants and normoactive.  No palpable masses. EXT: warm and dry.  Peripheral pulses equal, intact, and +2 globally.  No clubbing or cyanosis.  +1 edema in bilateral lower extremities. SKIN: warm and dry with normal turgor.  No rashes or abnormal lesions observed. NEURO: CN II-XII grossly intact.  Muscle strength +5/5 in bilateral upper and lower extremities.  Sensation is grossly intact.  No focal deficit.     Assessment/Plan:

## 2011-04-26 NOTE — Patient Instructions (Addendum)
Schedule a followup appointment with Dr. Jerelene Redden in May. You have an appointment for an eye exam with Dr. Sabra Heck on April 10th at 9:45 AM. Start taking her medicines as directed. If you have any side effects call the clinic at [phone]. Please come back to meet with Bonna Gains.  You need to meet with the Bonna Gains to get your [facility] card. Items required to complete an Eligibility Application   1. Picture ID (Can't be expired) 2. Current Bill to establish proof of residency 3. W-2 & Tax return (if self-employed include "Schedule C"), if not filing Form 4506 4. 4 current Pay stubs for this year 5. Printout of other income (Social security, unemployment, child support, workmen's comp) 6. Food stamp award letter, if receiving  7. Life Insurance (Need copy of the front page, showing name Ins Co. Name, and face amount). 8. Statement for pension, 401-K, IRS (needs to have current balance) 9. Tax Value for cars, houses, mobile homes, and land (Get from [facility] Tax Department) 10. Disability Paperwork (showing status of case) 11. College students: Print out of [place] received, tuition cost, books, etc. 12. If no Income: Games developer of support for free shelter, money, food, Social research officer, government.  Bring all that you can to your follow up appointment to start the process.

## 2011-04-26 NOTE — Assessment & Plan Note (Signed)
Hemoglobin A1c significantly elevated at 14.  Patient has been off of all medications for the past 4 months.  Unfortunately she will not be able to afford strips at this time.  Will not resume insulin until she is able to obtain strips to monitor CBGs.  Discussed this at length with the patient; she agrees with plan and states she will follow up with Bonna Gains re: orange card.  In the meantime, will resume Metformin.  Patient states she will be able to afford metformin, lisinopril, and simvastatin prescriptions today. She is advised to call the clinic should she develop any adverse effects.  Will refer her to Dr. Ammie Ferrier ophthalmology clinic on 05/09/2011 for her annual diabetic eye exam.  Patient denies any visual changes.  Provided a work note to excuse patient from work on April 10 2 her to attend ophthalmology clinic.

## 2011-04-26 NOTE — Assessment & Plan Note (Signed)
Will resume statin today starting at 20 mg of simvastatin daily. Will not obtain a lipid profile at this time given the cost burden to patient and need to repeat this at her followup visit in 6 weeks to assess response to statin.  She is advised to call the clinic should she develop any adverse effects from her simvastatin or other medications.

## 2011-04-26 NOTE — Assessment & Plan Note (Signed)
Blood pressure is within acceptable limits. Will resume lisinopril today given her diabetes and microalbuminuria.  Will not get any labs today as this may result in a significant cost to the patient.  Strongly encouraged patient to return to the clinic to meet with Bonna Gains and complete application for the orange card

## 2012-04-21 ENCOUNTER — Encounter: Payer: Self-pay | Admitting: Internal Medicine

## 2012-07-08 ENCOUNTER — Other Ambulatory Visit (HOSPITAL_COMMUNITY): Payer: Self-pay | Admitting: Internal Medicine

## 2012-07-08 DIAGNOSIS — Z1231 Encounter for screening mammogram for malignant neoplasm of breast: Secondary | ICD-10-CM

## 2012-07-09 ENCOUNTER — Ambulatory Visit (HOSPITAL_COMMUNITY)
Admission: RE | Admit: 2012-07-09 | Discharge: 2012-07-09 | Disposition: A | Discharge status: Home / Self Care | Payer: BC Managed Care – PPO | Source: Ambulatory Visit | Attending: Internal Medicine | Admitting: Internal Medicine

## 2012-07-09 DIAGNOSIS — Z1231 Encounter for screening mammogram for malignant neoplasm of breast: Secondary | ICD-10-CM

## 2012-08-07 ENCOUNTER — Other Ambulatory Visit: Payer: Self-pay

## 2012-08-26 ENCOUNTER — Ambulatory Visit: Payer: BC Managed Care – PPO | Admitting: Internal Medicine

## 2012-10-06 ENCOUNTER — Encounter: Payer: Self-pay | Admitting: Internal Medicine

## 2012-10-09 ENCOUNTER — Encounter: Payer: Self-pay | Admitting: Internal Medicine

## 2012-12-12 ENCOUNTER — Encounter: Payer: BC Managed Care – PPO | Admitting: Dietician

## 2012-12-12 ENCOUNTER — Ambulatory Visit (INDEPENDENT_AMBULATORY_CARE_PROVIDER_SITE_OTHER): Payer: BC Managed Care – PPO | Admitting: Internal Medicine

## 2012-12-12 ENCOUNTER — Encounter: Payer: Self-pay | Admitting: Internal Medicine

## 2012-12-12 ENCOUNTER — Other Ambulatory Visit: Payer: Self-pay | Admitting: Dietician

## 2012-12-12 ENCOUNTER — Ambulatory Visit: Payer: BC Managed Care – PPO | Admitting: Internal Medicine

## 2012-12-12 VITALS — BP 170/95 | HR 87 | Temp 98.4°F | Ht 62.0 in | Wt 195.5 lb

## 2012-12-12 DIAGNOSIS — E785 Hyperlipidemia, unspecified: Secondary | ICD-10-CM

## 2012-12-12 DIAGNOSIS — E119 Type 2 diabetes mellitus without complications: Secondary | ICD-10-CM

## 2012-12-12 DIAGNOSIS — Z23 Encounter for immunization: Secondary | ICD-10-CM

## 2012-12-12 DIAGNOSIS — H538 Other visual disturbances: Secondary | ICD-10-CM

## 2012-12-12 DIAGNOSIS — I10 Essential (primary) hypertension: Secondary | ICD-10-CM

## 2012-12-12 LAB — COMPLETE METABOLIC PANEL WITH GFR
ALT: 8 U/L (ref 0–35)
AST: 11 U/L (ref 0–37)
Albumin: 3.8 g/dL (ref 3.5–5.2)
Alkaline Phosphatase: 130 U/L — ABNORMAL HIGH (ref 39–117)
BUN: 8 mg/dL (ref 6–23)
Potassium: 4.2 mEq/L (ref 3.5–5.3)
Sodium: 137 mEq/L (ref 135–145)

## 2012-12-12 LAB — LIPID PANEL
Cholesterol: 279 mg/dL — ABNORMAL HIGH (ref 0–200)
Triglycerides: 150 mg/dL — ABNORMAL HIGH (ref <150)
VLDL: 30 mg/dL (ref 0–40)

## 2012-12-12 LAB — POCT GLYCOSYLATED HEMOGLOBIN (HGB A1C): Hemoglobin A1C: >14

## 2012-12-12 LAB — GLUCOSE, CAPILLARY: Glucose-Capillary: 338 mg/dL — ABNORMAL HIGH (ref 70–99)

## 2012-12-12 MED ORDER — SIMVASTATIN 20 MG PO TABS: 20.0000 mg | ORAL_TABLET | Freq: Every day | ORAL | Status: DC

## 2012-12-12 MED ORDER — INSULIN NPH ISOPHANE & REGULAR (70-30) 100 UNIT/ML ~~LOC~~ SUSP: SUBCUTANEOUS | Status: DC

## 2012-12-12 MED ORDER — "INSULIN SYRINGE-NEEDLE U-100 31G X 15/64"" 0.3 ML MISC": Status: DC

## 2012-12-12 MED ORDER — METFORMIN HCL 1000 MG PO TABS: 1000.0000 mg | ORAL_TABLET | Freq: Two times a day (BID) | ORAL | Status: DC

## 2012-12-12 MED ORDER — LISINOPRIL 20 MG PO TABS: 20.0000 mg | ORAL_TABLET | Freq: Every day | ORAL | Status: DC

## 2012-12-12 MED ORDER — GLUCOSE BLOOD VI STRP: ORAL_STRIP | Status: DC

## 2012-12-12 MED ORDER — ONETOUCH DELICA LANCETS FINE MISC: Status: DC

## 2012-12-12 NOTE — Patient Instructions (Signed)
Today we will be sending in all the prescriptions for your medications.   We will also be doing some blood work.   Your blood pressure today is high, so it is very important that you take your medications everyday.   Also we will like you to talk to Butch Penny. And about getting a glucometer.   The bleeding in your eye is most likely from uncontrolled hypertension and Diabetes. Please Please Please keep your appointment with the retinal specialist on Tuesday the 18th.  If you are ever having problems with affording your medications please let us know, we can arrange for an [facility] card which help.

## 2012-12-12 NOTE — Assessment & Plan Note (Addendum)
HBA1c significantly elevated today- >14. Not taken any medications in over a year. Now has [employer] blue shield insurance. Discussed with pt on the importance of taken her meds, and if in the future she has problems with insurance, we can arrange for the orange card which could help her afford some of her meds.   Plan - Refill pts meds- Insulin- Novolin 70/30- 22u morn, 20u at night.  - Metformin- 1000u BID. - Urine Microalb-Cr  - Refer to [facility], glucometer was given today.

## 2012-12-12 NOTE — Progress Notes (Signed)
Patient ID: [name], female   DOB: February 21, 1950, 62 y.o.   MRN: [id number]   Subjective:   Patient ID: [name] female   DOB: [birth year] 62 y.o.   MRN: [id number]  HPI: [name] is a 62 y.o. with PMH of HTN, GERD, DM2, colonic adenomas, HLD.   Pts last clinic visit- 04/26/2011.Pt has noty been compliant with her meds because she had no insurance and could not afford her meds. Currently has Blue cross blue shield, which she got in sept last year, but had problems with her phone and contacting her so she missed her last appointment. Patient has not taken any of her medications in over a year. Presented today with complaint sofa lump in her Left breast, which has been present for 8 months, which she can feel and then [facility] cause she [facility] due. No change in colour of her breast, no change in appearance, no ulcers, no redness, No weightloss, no headaches, no seizures, no back pains or bone pains, no abdominal distension, complaints, but will like to resume her medications. She says she doesnt know how to go about getting a doctor. As per chat review, Mammogram showed no radiologic evidence of malig, and recommendation for screening in one year.   Past Medical History  Diagnosis Date  . Diabetes mellitus 1998    Dx in 1998. Microalbuminuria, never on insulin.  Marland Kitchen GERD (gastroesophageal reflux disease)   . Hyperlipidemia   . Hypertension   . History of blurry vision 06/09    Hospitalized for this  . Personal history of colonic adenomas 10/07/2007   Current Outpatient Prescriptions  Medication Sig Dispense Refill  . glucose blood (ONE TOUCH ULTRA TEST) test strip Check twice a day before giving insulin dx code 250.00  100 each  5  . glucose blood (RELION ULTIMA TEST) test strip Test your sugar once daily before your meal x 4 days, then test 4 times daily before meals x 4 days  100 each  12  . insulin NPH-regular (NOVOLIN  70/30) (70-30) 100 UNIT/ML injection Please inject 22units into skin 30 minutes before breakfast Then inject 20 units into skin 30 minutes before dinner  100 mL  12  . Insulin Syringe-Needle U-100 31G X 15/64" 0.3 ML MISC Use to inject insulin twice daily as directed dx code 250.00  100 each  5  . lisinopril (PRINIVIL,ZESTRIL) 20 MG tablet Take 1 tablet (20 mg total) by mouth daily.  30 tablet  5  . metFORMIN (GLUCOPHAGE) 1000 MG tablet Take 1 tablet (1,000 mg total) by mouth 2 (two) times daily with a meal.  60 tablet  3  . ONETOUCH DELICA LANCETS FINE MISC Check twice a day before giving insulin dx code 250.00  100 each  12  . simvastatin (ZOCOR) 20 MG tablet Take 1 tablet (20 mg total) by mouth at bedtime.  30 tablet  4   No current facility-administered medications for this visit.   Family History  Problem Relation Age of Onset  . Diabetes Mother     Mother died of MI at age 62  . Heart failure Mother   . Kidney failure Mother   . Heart failure Sister     Died  . Heart failure Brother     Died  . Diabetes Brother     Died   History   Social History  . Marital Status: Widowed  Spouse Name: N/A    Number of Children: N/A  . Years of Education: N/A   Social History Main Topics  . Smoking status: Never Smoker   . Smokeless tobacco: None  . Alcohol Use: None  . Drug Use: None  . Sexual Activity: None   Other Topics Concern  . None   Social History Narrative   Married x 42 yrs.  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   Never smoked. Doesn't drink or use drugs.   Does Patient Exercise:  yes - sometimes      To get diabetes supplies for [name] RD, CDE, April 20,2011   Review of Systems: CONSTITUTIONAL- No Fever, weightloss, night sweat,no change in appetite. SKIN- No Rash, colour changes, no itching. HEAD- No Headache, dizziness. EYES- No Vision loss- , pain, redness, no double or blurred  vision. EARS- No vertigo, hearing loss, ear discharge. Mouth/throat- No Sorethroat. ]RESPIRATORY- No Cough, SOB. CARDIAC- No Palpitations, DOE, PND, no chest pain. GI- No vomiting, diarrhoea,no constipation. URINARY- No dysurai,  NEUROLOGIC- No Numbness, syncope, burning.  Objective:  Physical Exam: Filed Vitals:   12/12/12 0958  BP: 170/95  Pulse: 87  Temp: 98.4 F (36.9 C)  TempSrc: Oral  Height: 5\' 2"  (1.575 m)  Weight: 195 lb 8 oz (88.678 kg)  SpO2: 98%   GENERAL- alert, co-operative, appears as stated 62, not in any distress. HEENT- Atraumatic, normocephalic, PERRL, EOMI, oral mucosa appears moist, good and intact dentition. No carotid bruit, no cervical LN enlargement, thyroid does not appear enlarged. CARDIAC- RRR, no murmurs, rubs or gallops. RESP- Moving equal volumes of air, and clear to auscultation bilaterally. BREAST- appears symmetric, no discoloration, redness or dimpling, areola-Everted, Rt breast- No palpable lumps or masses, Left breast- No palpable masses, patient could no longer palpate/identify the lump either. ABDOMEN- Soft,non tender, no palpable masses or organomegaly, bowel sounds present. BACK- Normal curvature of the spine, No tenderness along the vertebrae, no CVA tenderness. NEURO- No obvious Cr N deficit, strenght equal and present in all extremities. EXTREMITIES- pulse 2+, symmetric, mild +1 pitting pedal edema. SKIN- Warm, dry, No rash or lesion. PSYCH- Normal mood and affect, appropriate thought content and speech.  Assessment & Plan:   The patient's case and plan of care was discussed with attending physician, Dr. Delfino Lovett.  Please see problem based chatting for assessment and plan.

## 2012-12-12 NOTE — Telephone Encounter (Signed)
Rxs signed and given to [facility] per Dr Denton Brick.

## 2012-12-12 NOTE — Assessment & Plan Note (Addendum)
Blood pressure elevated today. No meds taken in about a year.   Vitals - 1 value per visit 04/26/2011 08/17/2010 [id number]  SYSTOLIC [id number] [id number]  DIASTOLIC 83 85 83   Last BMP- 07/27/2010- Na- 135, K- 4.7, Cr- 1.01.  Plan - Restart Blood pressure meds, previously well controled on Lisinopril 20mg  dly, will also help with pts microalbuminuria. - BMP check today.

## 2012-12-12 NOTE — Assessment & Plan Note (Signed)
Last LDL- 139, 07/26/2012.  Plan - Restart simvastatin- 20mg  dly. - Lipid profile check today. - CMP today.

## 2012-12-12 NOTE — Telephone Encounter (Signed)
Patient needs prescriptions for diabetes supplies

## 2012-12-12 NOTE — Assessment & Plan Note (Signed)
Went for annual DM eye exam a week ago, 12/05/2012, and was told she has some bleeding in her Left eye, an appointment was made for her to follow up with a retinal specialist on the 18th of this month, Nov, 2014.

## 2012-12-13 LAB — MICROALBUMIN / CREATININE URINE RATIO: Microalb, Ur: 141.6 mg/dL — ABNORMAL HIGH (ref 0.00–1.89)

## 2012-12-15 NOTE — Progress Notes (Signed)
I saw and evaluated the patient.  I personally confirmed the key portions of the history and exam documented by Dr. Emokpae and I reviewed pertinent patient test results.  The assessment, diagnosis, and plan were formulated together and I agree with the documentation in the resident's note. 

## 2013-01-09 ENCOUNTER — Ambulatory Visit: Payer: BC Managed Care – PPO | Admitting: Internal Medicine

## 2013-05-19 ENCOUNTER — Encounter: Payer: Self-pay | Admitting: Internal Medicine

## 2013-09-24 ENCOUNTER — Encounter: Payer: Self-pay | Admitting: Internal Medicine

## 2014-01-29 DIAGNOSIS — J189 Pneumonia, unspecified organism: Secondary | ICD-10-CM

## 2014-01-29 HISTORY — DX: Pneumonia, unspecified organism: J18.9

## 2014-02-22 ENCOUNTER — Emergency Department (HOSPITAL_COMMUNITY): Payer: BC Managed Care – PPO

## 2014-02-22 ENCOUNTER — Inpatient Hospital Stay (HOSPITAL_COMMUNITY)
Admission: EM | Admit: 2014-02-22 | Discharge: 2014-03-06 | DRG: 233 | Disposition: A | Discharge status: Home Health | Payer: BC Managed Care – PPO | Attending: Cardiothoracic Surgery | Admitting: Cardiothoracic Surgery

## 2014-02-22 ENCOUNTER — Encounter (HOSPITAL_COMMUNITY): Payer: Self-pay | Admitting: Emergency Medicine

## 2014-02-22 DIAGNOSIS — J90 Pleural effusion, not elsewhere classified: Secondary | ICD-10-CM

## 2014-02-22 DIAGNOSIS — E1122 Type 2 diabetes mellitus with diabetic chronic kidney disease: Secondary | ICD-10-CM | POA: Diagnosis present

## 2014-02-22 DIAGNOSIS — D62 Acute posthemorrhagic anemia: Secondary | ICD-10-CM | POA: Diagnosis not present

## 2014-02-22 DIAGNOSIS — R0600 Dyspnea, unspecified: Secondary | ICD-10-CM | POA: Diagnosis present

## 2014-02-22 DIAGNOSIS — Z794 Long term (current) use of insulin: Secondary | ICD-10-CM | POA: Diagnosis not present

## 2014-02-22 DIAGNOSIS — E1165 Type 2 diabetes mellitus with hyperglycemia: Secondary | ICD-10-CM | POA: Diagnosis present

## 2014-02-22 DIAGNOSIS — Z23 Encounter for immunization: Secondary | ICD-10-CM

## 2014-02-22 DIAGNOSIS — I5021 Acute systolic (congestive) heart failure: Secondary | ICD-10-CM | POA: Diagnosis not present

## 2014-02-22 DIAGNOSIS — D696 Thrombocytopenia, unspecified: Secondary | ICD-10-CM | POA: Diagnosis not present

## 2014-02-22 DIAGNOSIS — Z7982 Long term (current) use of aspirin: Secondary | ICD-10-CM | POA: Diagnosis not present

## 2014-02-22 DIAGNOSIS — Z951 Presence of aortocoronary bypass graft: Secondary | ICD-10-CM

## 2014-02-22 DIAGNOSIS — R0902 Hypoxemia: Secondary | ICD-10-CM | POA: Diagnosis not present

## 2014-02-22 DIAGNOSIS — Z833 Family history of diabetes mellitus: Secondary | ICD-10-CM

## 2014-02-22 DIAGNOSIS — I509 Heart failure, unspecified: Secondary | ICD-10-CM

## 2014-02-22 DIAGNOSIS — E785 Hyperlipidemia, unspecified: Secondary | ICD-10-CM | POA: Diagnosis present

## 2014-02-22 DIAGNOSIS — Z9114 Patient's other noncompliance with medication regimen: Secondary | ICD-10-CM | POA: Diagnosis present

## 2014-02-22 DIAGNOSIS — I2582 Chronic total occlusion of coronary artery: Secondary | ICD-10-CM | POA: Diagnosis not present

## 2014-02-22 DIAGNOSIS — K219 Gastro-esophageal reflux disease without esophagitis: Secondary | ICD-10-CM | POA: Diagnosis present

## 2014-02-22 DIAGNOSIS — Z8249 Family history of ischemic heart disease and other diseases of the circulatory system: Secondary | ICD-10-CM

## 2014-02-22 DIAGNOSIS — R739 Hyperglycemia, unspecified: Secondary | ICD-10-CM | POA: Insufficient documentation

## 2014-02-22 DIAGNOSIS — I10 Essential (primary) hypertension: Secondary | ICD-10-CM | POA: Diagnosis present

## 2014-02-22 DIAGNOSIS — J189 Pneumonia, unspecified organism: Secondary | ICD-10-CM | POA: Diagnosis not present

## 2014-02-22 DIAGNOSIS — Z09 Encounter for follow-up examination after completed treatment for conditions other than malignant neoplasm: Secondary | ICD-10-CM

## 2014-02-22 DIAGNOSIS — I5042 Chronic combined systolic (congestive) and diastolic (congestive) heart failure: Secondary | ICD-10-CM | POA: Diagnosis not present

## 2014-02-22 DIAGNOSIS — I255 Ischemic cardiomyopathy: Secondary | ICD-10-CM | POA: Diagnosis present

## 2014-02-22 DIAGNOSIS — I2510 Atherosclerotic heart disease of native coronary artery without angina pectoris: Principal | ICD-10-CM | POA: Diagnosis present

## 2014-02-22 DIAGNOSIS — E119 Type 2 diabetes mellitus without complications: Secondary | ICD-10-CM | POA: Diagnosis present

## 2014-02-22 DIAGNOSIS — I319 Disease of pericardium, unspecified: Secondary | ICD-10-CM

## 2014-02-22 HISTORY — DX: Pleural effusion, not elsewhere classified: J90

## 2014-02-22 HISTORY — DX: Pneumonia, unspecified organism: J18.9

## 2014-02-22 LAB — CBC
HCT: 34.1 % — ABNORMAL LOW (ref 36.0–46.0)
HEMOGLOBIN: 11.2 g/dL — AB (ref 12.0–15.0)
MCH: 27.1 pg (ref 26.0–34.0)
MCHC: 32.8 g/dL (ref 30.0–36.0)
MCV: 82.6 fL (ref 78.0–100.0)
Platelets: 209 10*3/uL (ref 150–400)
RBC: 4.13 MIL/uL (ref 3.87–5.11)
RDW: 13.8 % (ref 11.5–15.5)
WBC: 7.2 10*3/uL (ref 4.0–10.5)

## 2014-02-22 LAB — BASIC METABOLIC PANEL
Anion gap: 7 (ref 5–15)
BUN: 13 mg/dL (ref 6–23)
CO2: 23 mmol/L (ref 19–32)
CREATININE: 0.83 mg/dL (ref 0.50–1.10)
Calcium: 8.5 mg/dL (ref 8.4–10.5)
Chloride: 104 mmol/L (ref 96–112)
GFR calc non Af Amer: 73 mL/min — ABNORMAL LOW (ref >90)
GFR, EST AFRICAN AMERICAN: 85 mL/min — AB (ref >90)
GLUCOSE: 395 mg/dL — AB (ref 70–99)
Potassium: 4.1 mmol/L (ref 3.5–5.1)
SODIUM: 134 mmol/L — AB (ref 135–145)

## 2014-02-22 LAB — CBC WITH DIFFERENTIAL/PLATELET
Basophils Absolute: 0 10*3/uL (ref 0.0–0.1)
Basophils Relative: 0 % (ref 0–1)
EOS ABS: 0.1 10*3/uL (ref 0.0–0.7)
Eosinophils Relative: 2 % (ref 0–5)
HEMATOCRIT: 35.8 % — AB (ref 36.0–46.0)
Hemoglobin: 11.7 g/dL — ABNORMAL LOW (ref 12.0–15.0)
LYMPHS PCT: 40 % (ref 12–46)
Lymphs Abs: 2.9 10*3/uL (ref 0.7–4.0)
MCH: 27.3 pg (ref 26.0–34.0)
MCHC: 32.7 g/dL (ref 30.0–36.0)
MCV: 83.6 fL (ref 78.0–100.0)
MONO ABS: 0.3 10*3/uL (ref 0.1–1.0)
MONOS PCT: 5 % (ref 3–12)
Neutro Abs: 4 10*3/uL (ref 1.7–7.7)
Neutrophils Relative %: 53 % (ref 43–77)
Platelets: 232 10*3/uL (ref 150–400)
RBC: 4.28 MIL/uL (ref 3.87–5.11)
RDW: 13.7 % (ref 11.5–15.5)
WBC: 7.4 10*3/uL (ref 4.0–10.5)

## 2014-02-22 LAB — LIPID PANEL
Cholesterol: 255 mg/dL — ABNORMAL HIGH (ref 0–200)
HDL: 56 mg/dL (ref >39)
LDL Cholesterol: 180 mg/dL — ABNORMAL HIGH (ref 0–99)
Total CHOL/HDL Ratio: 4.6 RATIO
Triglycerides: 94 mg/dL (ref <150)
VLDL: 19 mg/dL (ref 0–40)

## 2014-02-22 LAB — GLUCOSE, CAPILLARY
Glucose-Capillary: 246 mg/dL — ABNORMAL HIGH (ref 70–99)
Glucose-Capillary: 197 mg/dL — ABNORMAL HIGH (ref 70–99)
Glucose-Capillary: 203 mg/dL — ABNORMAL HIGH (ref 70–99)

## 2014-02-22 LAB — HEMOGLOBIN A1C
Hgb A1c MFr Bld: 13.1 % — ABNORMAL HIGH (ref <5.7)
Mean Plasma Glucose: 329 mg/dL — ABNORMAL HIGH (ref <117)

## 2014-02-22 LAB — CBG MONITORING, ED: Glucose-Capillary: 186 mg/dL — ABNORMAL HIGH (ref 70–99)

## 2014-02-22 LAB — TROPONIN I
TROPONIN I: 0.03 ng/mL (ref <0.031)
Troponin I: <0.03 ng/mL (ref <0.031)

## 2014-02-22 LAB — PROCALCITONIN: Procalcitonin: <0.1 ng/mL

## 2014-02-22 LAB — CREATININE, SERUM
Creatinine, Ser: 0.8 mg/dL (ref 0.50–1.10)
GFR calc Af Amer: 89 mL/min — ABNORMAL LOW (ref >90)
GFR calc non Af Amer: 77 mL/min — ABNORMAL LOW (ref >90)

## 2014-02-22 LAB — I-STAT TROPONIN, ED: Troponin i, poc: 0.01 ng/mL (ref 0.00–0.08)

## 2014-02-22 LAB — TSH: TSH: 1.87 u[IU]/mL (ref 0.350–4.500)

## 2014-02-22 LAB — BRAIN NATRIURETIC PEPTIDE: B Natriuretic Peptide: 520.5 pg/mL — ABNORMAL HIGH (ref 0.0–100.0)

## 2014-02-22 MED ORDER — ONDANSETRON HCL 4 MG/2ML IJ SOLN
4.0000 mg | Freq: Once | INTRAMUSCULAR | Status: AC
  Administered 2014-02-22: 4 mg via INTRAVENOUS
  Filled 2014-02-22: qty 2

## 2014-02-22 MED ORDER — HEPARIN SODIUM (PORCINE) 5000 UNIT/ML IJ SOLN
5000.0000 [IU] | Freq: Three times a day (TID) | INTRAMUSCULAR | Status: DC
  Administered 2014-02-22 – 2014-02-25 (×11): 5000 [IU] via SUBCUTANEOUS
  Filled 2014-02-22 (×12): qty 1

## 2014-02-22 MED ORDER — FUROSEMIDE 10 MG/ML IJ SOLN
40.0000 mg | Freq: Once | INTRAMUSCULAR | Status: AC
  Administered 2014-02-22: 40 mg via INTRAVENOUS

## 2014-02-22 MED ORDER — ONDANSETRON HCL 4 MG/2ML IJ SOLN: 4.0000 mg | Freq: Four times a day (QID) | INTRAMUSCULAR | Status: DC | PRN

## 2014-02-22 MED ORDER — HYDROCODONE-ACETAMINOPHEN 5-325 MG PO TABS: 1.0000 | ORAL_TABLET | ORAL | Status: DC | PRN

## 2014-02-22 MED ORDER — IPRATROPIUM-ALBUTEROL 0.5-2.5 (3) MG/3ML IN SOLN
3.0000 mL | Freq: Once | RESPIRATORY_TRACT | Status: AC
  Administered 2014-02-22: 3 mL via RESPIRATORY_TRACT
  Filled 2014-02-22: qty 3

## 2014-02-22 MED ORDER — SODIUM CHLORIDE 0.9 % IV SOLN: 250.0000 mL | INTRAVENOUS | Status: DC | PRN

## 2014-02-22 MED ORDER — SODIUM CHLORIDE 0.9 % IJ SOLN
3.0000 mL | Freq: Two times a day (BID) | INTRAMUSCULAR | Status: DC
  Administered 2014-02-22 – 2014-02-25 (×6): 3 mL via INTRAVENOUS

## 2014-02-22 MED ORDER — INFLUENZA VAC SPLIT QUAD 0.5 ML IM SUSY
0.5000 mL | PREFILLED_SYRINGE | INTRAMUSCULAR | Status: AC
  Administered 2014-02-23: 0.5 mL via INTRAMUSCULAR
  Filled 2014-02-22: qty 0.5

## 2014-02-22 MED ORDER — IOHEXOL 350 MG/ML SOLN
100.0000 mL | Freq: Once | INTRAVENOUS | Status: AC | PRN
  Administered 2014-02-22: 100 mL via INTRAVENOUS

## 2014-02-22 MED ORDER — ACETAMINOPHEN 325 MG PO TABS
650.0000 mg | ORAL_TABLET | Freq: Four times a day (QID) | ORAL | Status: DC | PRN
  Filled 2014-02-22: qty 2

## 2014-02-22 MED ORDER — INSULIN ASPART 100 UNIT/ML ~~LOC~~ SOLN
0.0000 [IU] | Freq: Three times a day (TID) | SUBCUTANEOUS | Status: DC
  Administered 2014-02-22: 3 [IU] via SUBCUTANEOUS
  Administered 2014-02-22: 2 [IU] via SUBCUTANEOUS
  Administered 2014-02-23: 1 [IU] via SUBCUTANEOUS
  Administered 2014-02-23 (×2): 2 [IU] via SUBCUTANEOUS
  Administered 2014-02-24 (×2): 1 [IU] via SUBCUTANEOUS
  Administered 2014-02-24 – 2014-02-25 (×2): 3 [IU] via SUBCUTANEOUS
  Administered 2014-02-26: 1 [IU] via SUBCUTANEOUS
  Administered 2014-02-26 – 2014-02-27 (×2): 3 [IU] via SUBCUTANEOUS
  Administered 2014-02-27: 1 [IU] via SUBCUTANEOUS
  Administered 2014-02-28: 2 [IU] via SUBCUTANEOUS
  Administered 2014-02-28: 1 [IU] via SUBCUTANEOUS
  Administered 2014-02-28: 2 [IU] via SUBCUTANEOUS

## 2014-02-22 MED ORDER — LEVOFLOXACIN IN D5W 500 MG/100ML IV SOLN
500.0000 mg | Freq: Once | INTRAVENOUS | Status: AC
  Administered 2014-02-22: 500 mg via INTRAVENOUS
  Filled 2014-02-22: qty 100

## 2014-02-22 MED ORDER — ONDANSETRON HCL 4 MG PO TABS: 4.0000 mg | ORAL_TABLET | Freq: Four times a day (QID) | ORAL | Status: DC | PRN

## 2014-02-22 MED ORDER — SODIUM CHLORIDE 0.9 % IJ SOLN
3.0000 mL | Freq: Two times a day (BID) | INTRAMUSCULAR | Status: DC
  Administered 2014-02-22 – 2014-02-25 (×5): 3 mL via INTRAVENOUS

## 2014-02-22 MED ORDER — SODIUM CHLORIDE 0.9 % IV BOLUS (SEPSIS)
1000.0000 mL | Freq: Once | INTRAVENOUS | Status: AC
  Administered 2014-02-22: 1000 mL via INTRAVENOUS

## 2014-02-22 MED ORDER — INSULIN ASPART 100 UNIT/ML ~~LOC~~ SOLN
10.0000 [IU] | Freq: Once | SUBCUTANEOUS | Status: AC
  Administered 2014-02-22: 10 [IU] via SUBCUTANEOUS
  Filled 2014-02-22: qty 1

## 2014-02-22 MED ORDER — LEVOFLOXACIN IN D5W 500 MG/100ML IV SOLN
500.0000 mg | INTRAVENOUS | Status: DC
  Administered 2014-02-22: 500 mg via INTRAVENOUS
  Filled 2014-02-22 (×2): qty 100

## 2014-02-22 MED ORDER — SODIUM CHLORIDE 0.9 % IJ SOLN: 3.0000 mL | INTRAMUSCULAR | Status: DC | PRN

## 2014-02-22 MED ORDER — LEVOFLOXACIN 500 MG PO TABS: 500.0000 mg | ORAL_TABLET | Freq: Once | ORAL | Status: DC

## 2014-02-22 MED ORDER — ASPIRIN EC 81 MG PO TBEC
81.0000 mg | DELAYED_RELEASE_TABLET | Freq: Every day | ORAL | Status: DC
  Administered 2014-02-22 – 2014-02-23 (×2): 81 mg via ORAL
  Filled 2014-02-22 (×2): qty 1

## 2014-02-22 MED ORDER — FUROSEMIDE 10 MG/ML IJ SOLN
40.0000 mg | Freq: Once | INTRAMUSCULAR | Status: AC
  Administered 2014-02-22: 40 mg via INTRAVENOUS
  Filled 2014-02-22: qty 4

## 2014-02-22 MED ORDER — ACETAMINOPHEN 650 MG RE SUPP: 650.0000 mg | Freq: Four times a day (QID) | RECTAL | Status: DC | PRN

## 2014-02-22 NOTE — Progress Notes (Signed)
*  PRELIMINARY RESULTS* Echocardiogram 2D Echocardiogram has been performed.  Leavy Cella 02/22/2014, 3:45 PM

## 2014-02-22 NOTE — ED Notes (Signed)
Two attempts made to call report on the patient. [facility] has been called for transport. ED Charge nurse made aware.

## 2014-02-22 NOTE — H&P (Signed)
Date: 02/22/2014               Patient Name:  [name] MRN: [id number]  DOB: 08/19/1950 Age / Sex: 64 y.o., female   PCP: No primary care provider on file.         Medical Service: Internal Medicine Teaching Service         Attending Physician: Dr. Axel Filler, MD    First Contact: Dr. Hulen Luster Pager: [phone]  Second Contact: Dr. Malachi Carl Pager: [phone]       After Hours (After 5p/  First Contact Pager: [phone]  weekends / holidays): Second Contact Pager: [phone]   Chief Complaint: SOB  History of Present Illness: Ms. Guay is a 64 yo woman pmh DM, HTN, HLD who presents with SOB. Pt states this started about 3 weeks ago getting worse over past 3 days. She states that it was a gradual onset and that her walking distance would decrease as the weeks went by and never has she had any CP with these events. She states if she slows down she can get through a task with minimal fatigue. She has also noticed some bilateral LE edema that has gotten worse during the past 2-3 months as well. She usually sleeps propped up in bed and has recently had to sleep sitting up otherwise she feels dyspneic and anxious given severity of her breathlessness "like I am going to drown." She has been non-complaint with her DM meds for the past year and has not had any dietary modifications during this past year as well. She is also not taking an aspirin. She denied any fever but has had some night sweats over the past 2-3 days. There has been no sick contacts at home or recent travel. The pt doesn't smoke, drink Etoh, and lives with her daughter and grandson.   Meds: No current facility-administered medications for this encounter.    Allergies: Allergies as of 02/22/2014  . (No Known Allergies)   Past Medical History  Diagnosis Date  . Diabetes mellitus 1998    Dx in 1998. Microalbuminuria, never on insulin.  Marland Kitchen GERD (gastroesophageal reflux disease)   . Hyperlipidemia   . Hypertension   .  History of blurry vision 06/09    Hospitalized for this  . Personal history of colonic adenomas 10/07/2007  . Pneumonia 01/2014   Past Surgical History  Procedure Laterality Date  . Total abdominal hysterectomy w/ bilateral salpingoophorectomy  1996   Family History  Problem Relation Age of Onset  . Diabetes Mother     Mother died of MI at age 66  . Heart failure Mother   . Kidney failure Mother   . Heart failure Sister     Died  . Heart failure Brother     Died  . Diabetes Brother     Died   History   Social History  . Marital Status: Widowed    Spouse Name: N/A    Number of Children: N/A  . Years of Education: N/A   Occupational History  . Not on file.   Social History Main Topics  . Smoking status: Never Smoker   . Smokeless tobacco: Never Used  . Alcohol Use: No  . Drug Use: No  . Sexual Activity: Not on file   Other Topics Concern  . Not on file   Social History Narrative   Married x 42 yrs.  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  Never smoked. Doesn't drink or use drugs.   Does Patient Exercise:  yes - sometimes      To get diabetes supplies for [name] RD, CDE, April 20,2011    Review of Systems: Pertinent items are noted in HPI.  Physical Exam: Blood pressure 144/90, pulse 109, temperature 98.5 F (36.9 C), temperature source Oral, resp. rate 22, height 5\' 2"  (1.575 m), weight 192 lb 10.9 oz (87.4 kg), SpO2 98 %. General: resting in bed, NAD HEENT: PERRL, EOMI, no scleral icterus Cardiac: RRR, no rubs, murmurs or gallops Pulm: markedly decreased BS in bilateral bases, deadened sound to bilateral lung percussion up to Upper lobes bilaterally, moving normal volumes of air Abd: soft, nontender, nondistended, BS present Ext: warm and well perfused,2+ pedal edema Neuro: alert and oriented X3, cranial nerves II-XII grossly intact   Lab results: Basic Metabolic Panel:  Recent Labs   02/22/14 0319  NA 134*  K 4.1  CL 104  CO2 23  GLUCOSE 395*  BUN 13  CREATININE 0.83  CALCIUM 8.5   CBC:  Recent Labs  02/22/14 0319  WBC 7.4  NEUTROABS 4.0  HGB 11.7*  HCT 35.8*  MCV 83.6  PLT 232   CBG:  Recent Labs  02/22/14 0649  GLUCAP 186*   Imaging results:  Dg Chest 2 View  02/22/2014   CLINICAL DATA:  Dyspnea. Shortness of breath and dry cough for 5 days. Nonsmoker.  EXAM: CHEST  2 VIEW  COMPARISON:  None.  FINDINGS: Shallow inspiration with infiltration or atelectasis in both lung bases. Blunting of the costophrenic angle suggesting small pleural effusions. Normal heart size and pulmonary vascularity. No pneumothorax. Degenerative changes in the spine and shoulders.  IMPRESSION: Infiltration or atelectasis in the lung bases with small pleural effusions.   Electronically Signed   By: Lucienne Capers M.D.   On: 02/22/2014 03:49   Ct Angio Chest Pe W/cm &/or Wo Cm  02/22/2014   CLINICAL DATA:  Shortness breath for 5 days. Cough. Dyspnea. Increased shortness of breath when lying flat.  EXAM: CT ANGIOGRAPHY CHEST WITH CONTRAST  TECHNIQUE: Multidetector CT imaging of the chest was performed using the standard protocol during bolus administration of intravenous contrast. Multiplanar CT image reconstructions and MIPs were obtained to evaluate the vascular anatomy.  CONTRAST:  12mL OMNIPAQUE IOHEXOL 350 MG/ML SOLN  COMPARISON:  Two-view chest x-ray 02/22/2014.  FINDINGS: Pulmonary arterial opacification is excellent. There is no focal filling defect to suggest pulmonary embolus.  The heart is mildly enlarged. Coronary artery calcifications are evident.  Moderate sized bilateral pleural effusions are present. Multi focal airspace disease involves the lingula and lower lobes bilaterally. There is some scattered ground-glass attenuation in the upper lobes. There is a dependent atelectasis are noted as well.  No significant mediastinal or axillary adenopathy is present.  Mild  endplate degenerative changes in some facet disease are noted throughout the thoracic spine. No focal lytic or blastic lesions are present.  Review of the MIP images confirms the above findings.  IMPRESSION: 1. Multi focal airspace disease compatible with pneumonia. 2. No pulmonary embolus. 3. Mild cardiomegaly. 4. Moderate bilateral pleural effusions. 5. Additional ground-glass attenuation suggests some element of edema as well. There is likely component of congestive heart failure associated.   Electronically Signed   By: Lawrence Santiago M.D.   On: 02/22/2014 07:19    Other results: EKG: there are no previous tracings available for comparison, sinus tachycardia, occasional PVC noted, unifocal.  Assessment & Plan  by Problem: 1. Dyspnea: Initially pt was hypoxic on RA in the low 90s that corrected with supplemental O2. Pt was given 1L bolus in ED and then developed respiratory distress and received CTA that was negative for PE but showed multifocal patchy infiltrates concerning for CAP and large bilateral effusions likely 2/2 heart failure and not para pneumonic effusions. The pt doesn't really meet SIRS criteria and her cough is very dry in nature in leu of her history likely represents pulmonary edema 2/2 possible undiagnosed CHF. There are no prior EKGs to make comparison but no Q waves seen today.  -repeat EKG in AM -2D Echo -troponin x1 trend if positive - IV diuresis was given IV lasix 40mg  will reasses and likely need more diuresis -strict I&O and daily weights -cont IV levofloxacin for presumed CAP -HIV, lipid panel -started ASA 81mg  qd -may need pulmonology to consider pleurocentesis if pt doesn't improve respiratory status   2. DM: Pt has been lost to f/u for the last year and hasn't been taking any mediciation.  -HgbA1c, SSI   Dispo: Disposition is deferred at this time, awaiting improvement of current medical problems. Anticipated discharge in approximately 2-3 day(s).   The  patient does have a current PCP (No primary care provider on file.) and does need an [facility] follow-up appointment after discharge.  The patient does not have transportation limitations that hinder transportation to clinic appointments.  Signed: Clinton Gallant, MD 02/22/2014, 9:41 AM

## 2014-02-22 NOTE — ED Notes (Signed)
Pt ambulate with out assistance [id number] SPO2=97. Pt starting to cough. Rn notifited

## 2014-02-22 NOTE — Progress Notes (Signed)
No change from previous assessment. Will continue to monitor patient. 

## 2014-02-22 NOTE — Progress Notes (Signed)
No urine output charted since 9AM. Pt states she has been voiding in bathroom and that the collection container is in the toilet. Currently some urine in hat. Hat emptied and pt educated to continue voiding in hat in order to monitor output.

## 2014-02-22 NOTE — ED Provider Notes (Signed)
TIME SEEN: 2:55 AM  CHIEF COMPLAINT: Short of breath  HPI: Pt is a 64 y.o. female with history of diabetes, hypertension, hyperlipidemia who presents emergency department with shortness of breath since Wednesday, 4 days ago. Denies any aggravating or alleviating factors. Denies any fever. Has had dry cough. No chest pain or chest discomfort. No lower extremity swelling or pain. No history of CHF, CAD, DVT or PE. No recent prolonged immobilization, fracture, surgery, trauma, exogenous estrogen use. She denies that she's ever been a smoker. No history of COPD or asthma. She does not wear oxygen at home.  ROS: See HPI Constitutional: no fever  Eyes: no drainage  ENT: no runny nose   Cardiovascular:  no chest pain  Resp:  SOB  GI: no vomiting GU: no dysuria Integumentary: no rash  Allergy: no hives  Musculoskeletal: no leg swelling  Neurological: no slurred speech ROS otherwise negative  PAST MEDICAL HISTORY/PAST SURGICAL HISTORY:  Past Medical History  Diagnosis Date  . Diabetes mellitus 1998    Dx in 1998. Microalbuminuria, never on insulin.  Marland Kitchen GERD (gastroesophageal reflux disease)   . Hyperlipidemia   . Hypertension   . History of blurry vision 06/09    Hospitalized for this  . Personal history of colonic adenomas 10/07/2007    MEDICATIONS:  Prior to Admission medications   Medication Sig Start Date End Date Taking? Authorizing Provider  glucose blood (ONE TOUCH ULTRA TEST) test strip Check twice a day before giving insulin dx code 250.00 12/12/12   Ejiroghene E Emokpae, MD  glucose blood (RELION ULTIMA TEST) test strip Test your sugar once daily before your meal x 4 days, then test 4 times daily before meals x 4 days 08/10/10 08/10/11  Ansel Bong, MD  insulin NPH-regular (NOVOLIN 70/30) (70-30) 100 UNIT/ML injection Please inject 22units into skin 30 minutes before breakfast Then inject 20 units into skin 30 minutes before dinner 12/12/12   Ejiroghene E Denton Brick, MD  Insulin  Syringe-Needle U-100 31G X 15/64" 0.3 ML MISC Use to inject insulin twice daily as directed dx code 250.00 12/12/12   Ejiroghene E Emokpae, MD  lisinopril (PRINIVIL,ZESTRIL) 20 MG tablet Take 1 tablet (20 mg total) by mouth daily. 12/12/12   [name], MD  metFORMIN (GLUCOPHAGE) 1000 MG tablet Take 1 tablet (1,000 mg total) by mouth 2 (two) times daily with a meal. 12/12/12   [name], MD  ONETOUCH DELICA LANCETS FINE MISC Check twice a day before giving insulin dx code 250.00 12/12/12   Ejiroghene E Emokpae, MD  simvastatin (ZOCOR) 20 MG tablet Take 1 tablet (20 mg total) by mouth at bedtime. 12/12/12 12/12/13  [name], MD    ALLERGIES:  No Known Allergies  SOCIAL HISTORY:  History  Substance Use Topics  . Smoking status: Never Smoker   . Smokeless tobacco: Never Used  . Alcohol Use: No    FAMILY HISTORY: Family History  Problem Relation Age of Onset  . Diabetes Mother     Mother died of MI at age 16  . Heart failure Mother   . Kidney failure Mother   . Heart failure Sister     Died  . Heart failure Brother     Died  . Diabetes Brother     Died    EXAM: BP 167/98 mmHg  Pulse 115  Temp(Src) 98.2 F (36.8 C) (Oral)  Resp 26  SpO2 91% CONSTITUTIONAL: Alert and oriented and responds appropriately to questions. Well-appearing; well-nourished HEAD:  Normocephalic EYES: Conjunctivae clear, PERRL ENT: normal nose; no rhinorrhea; moist mucous membranes; pharynx without lesions noted NECK: Supple, no meningismus, no LAD  CARD: Regular and tachycardic; S1 and S2 appreciated; no murmurs, no clicks, no rubs, no gallops RESP: Normal chest excursion without splinting or tachypnea; breath sounds clear and equal bilaterally; no wheezes, no rhonchi, no rales, patient oxygen saturation is 91% on room air at rest, speaking full sentences, no respiratory distress ABD/GI: Normal bowel sounds; non-distended; soft, non-tender, no rebound, no guarding BACK:   The back appears normal and is non-tender to palpation, there is no CVA tenderness EXT: Normal ROM in all joints; non-tender to palpation; no edema; normal capillary refill; no cyanosis; no calf tenderness or swelling   SKIN: Normal color for age and race; warm NEURO: Moves all extremities equally PSYCH: The patient's mood and manner are appropriate. Grooming and personal hygiene are appropriate.  MEDICAL DECISION MAKING: Patient here with shortness of breath. She has mild to tachycardic and mildly hypoxic. She has no history of PE or DVT, CAD or CHF. She doesn't multiple risk factors for ACS. Her lungs are clear to auscultation. She does report a increase dry cough recently. We'll start with basic labs, troponin, BNP, chest x-ray. If workup unremarkable she may need a CT scan of her chest to evaluate for possible pulmonary embolus.  ED PROGRESS: Pt's CXR shows small bilateral pleural effusions and possible bilateral infiltrates. Labs otherwise unremarkable except for elevated glucose with normal bicarbonate and anion gap. We'll give IV fluids, insulin. Reports she has not been on any of her medications for over one year.  BNP is elevated at 520 but she does not appear volume overloaded. Will obtain CT scan for further evaluation and to rule out pulmonary embolus. She was able to a bili with sats of 97% but became tachycardic in the 120s.    Patient became extremely short of breath with lying flat during CT scan is now tachycardic, tachypneic with mild respiratory distress, speaking short sentences. She did receive some IV fluids for her tachycardia and hyperglycemia. Have stopped IV fluids. She does have some mild extra wheezing at her bases which may be cardiac in nature but will give duoneb.  CBG now 186.     7:00 AM  Spoke with IM resident who agrees with admission and transfer to [facility]. Will admit to inpatient, telemetry.     EKG Interpretation  Date/Time:  Monday February 22 2014 03:27:22 EST Ventricular Rate:  112 PR Interval:  159 QRS Duration: 89 QT Interval:  362 QTC Calculation: 494 R Axis:   39 Text Interpretation:  Sinus tachycardia Probable LVH with secondary repol abnrm Borderline prolonged QT interval Confirmed by Cia Garretson,  DO, Zayda Angell [phone]) on 02/22/2014 3:36:13 AM        Normal, DO 02/22/14 [id number]

## 2014-02-22 NOTE — ED Notes (Signed)
Pt states she has been short of breath since Wednesday. Pt is able to speak in complete sentences but her breathing shallow.

## 2014-02-22 NOTE — ED Notes (Signed)
Attempted to call report on Pt was told RN was still in morning report will return call.

## 2014-02-23 DIAGNOSIS — I5021 Acute systolic (congestive) heart failure: Secondary | ICD-10-CM | POA: Diagnosis present

## 2014-02-23 DIAGNOSIS — E785 Hyperlipidemia, unspecified: Secondary | ICD-10-CM

## 2014-02-23 LAB — CBC
HCT: 32.8 % — ABNORMAL LOW (ref 36.0–46.0)
HEMOGLOBIN: 10.8 g/dL — AB (ref 12.0–15.0)
MCH: 27.3 pg (ref 26.0–34.0)
MCHC: 32.9 g/dL (ref 30.0–36.0)
MCV: 82.8 fL (ref 78.0–100.0)
PLATELETS: 216 10*3/uL (ref 150–400)
RBC: 3.96 MIL/uL (ref 3.87–5.11)
RDW: 13.8 % (ref 11.5–15.5)
WBC: 5.8 10*3/uL (ref 4.0–10.5)

## 2014-02-23 LAB — COMPREHENSIVE METABOLIC PANEL
ALT: 42 U/L — ABNORMAL HIGH (ref 0–35)
AST: 33 U/L (ref 0–37)
Albumin: 2.7 g/dL — ABNORMAL LOW (ref 3.5–5.2)
Alkaline Phosphatase: 77 U/L (ref 39–117)
Anion gap: 9 (ref 5–15)
BUN: 10 mg/dL (ref 6–23)
CHLORIDE: 105 mmol/L (ref 96–112)
CO2: 25 mmol/L (ref 19–32)
Calcium: 8.6 mg/dL (ref 8.4–10.5)
Creatinine, Ser: 0.84 mg/dL (ref 0.50–1.10)
GFR calc Af Amer: 84 mL/min — ABNORMAL LOW (ref >90)
GFR, EST NON AFRICAN AMERICAN: 72 mL/min — AB (ref >90)
GLUCOSE: 171 mg/dL — AB (ref 70–99)
POTASSIUM: 3.5 mmol/L (ref 3.5–5.1)
Sodium: 139 mmol/L (ref 135–145)
Total Bilirubin: 0.6 mg/dL (ref 0.3–1.2)
Total Protein: 6.1 g/dL (ref 6.0–8.3)

## 2014-02-23 LAB — GLUCOSE, CAPILLARY
GLUCOSE-CAPILLARY: 148 mg/dL — AB (ref 70–99)
Glucose-Capillary: 192 mg/dL — ABNORMAL HIGH (ref 70–99)
Glucose-Capillary: 176 mg/dL — ABNORMAL HIGH (ref 70–99)
Glucose-Capillary: 142 mg/dL — ABNORMAL HIGH (ref 70–99)

## 2014-02-23 LAB — TROPONIN I

## 2014-02-23 LAB — HIV ANTIBODY (ROUTINE TESTING W REFLEX)
HIV 1/O/2 Abs-Index Value: <1 (ref <1.00)
HIV-1/HIV-2 Ab: NONREACTIVE

## 2014-02-23 MED ORDER — ASPIRIN 81 MG PO CHEW
81.0000 mg | CHEWABLE_TABLET | Freq: Every day | ORAL | Status: DC
  Administered 2014-02-24 – 2014-02-28 (×4): 81 mg via ORAL
  Filled 2014-02-23 (×4): qty 1

## 2014-02-23 MED ORDER — LEVOFLOXACIN 500 MG PO TABS
500.0000 mg | ORAL_TABLET | Freq: Every day | ORAL | Status: DC
  Administered 2014-02-23 – 2014-02-27 (×4): 500 mg via ORAL
  Filled 2014-02-23 (×6): qty 1

## 2014-02-23 MED ORDER — FUROSEMIDE 10 MG/ML IJ SOLN
40.0000 mg | Freq: Two times a day (BID) | INTRAMUSCULAR | Status: DC
  Administered 2014-02-23 – 2014-02-25 (×4): 40 mg via INTRAVENOUS
  Filled 2014-02-23 (×5): qty 4

## 2014-02-23 MED ORDER — LISINOPRIL 2.5 MG PO TABS
2.5000 mg | ORAL_TABLET | Freq: Every day | ORAL | Status: DC
  Administered 2014-02-23 – 2014-02-27 (×4): 2.5 mg via ORAL
  Filled 2014-02-23 (×5): qty 1

## 2014-02-23 MED ORDER — PANTOPRAZOLE SODIUM 20 MG PO TBEC
20.0000 mg | DELAYED_RELEASE_TABLET | Freq: Every day | ORAL | Status: DC
  Administered 2014-02-23 – 2014-02-28 (×5): 20 mg via ORAL
  Filled 2014-02-23 (×8): qty 1

## 2014-02-23 MED ORDER — ATORVASTATIN CALCIUM 80 MG PO TABS
80.0000 mg | ORAL_TABLET | Freq: Every day | ORAL | Status: DC
  Administered 2014-02-23 – 2014-03-05 (×10): 80 mg via ORAL
  Filled 2014-02-23 (×12): qty 1

## 2014-02-23 MED ORDER — SIMVASTATIN 20 MG PO TABS: 20.0000 mg | ORAL_TABLET | Freq: Every day | ORAL | Status: DC

## 2014-02-23 NOTE — Progress Notes (Signed)
Subjective: Pt doing well, slept well overnight. States breathing has improved.  Objective: Vital signs in last 24 hours: Filed Vitals:   02/22/14 2020 02/22/14 2150 02/23/14 0228 02/23/14 0534  BP: 132/88 137/80 122/73 129/75  Pulse: 108  103 100  Temp: 98.9 F (37.2 C)  98.6 F (37 C) 98.9 F (37.2 C)  TempSrc: Oral  Oral Oral  Resp: 20  18 17   Height:      Weight:    189 lb 2.5 oz (85.8 kg)  SpO2: 99%  98% 100%   Weight change:   Intake/Output Summary (Last 24 hours) at 02/23/14 1351 Last data filed at 02/23/14 1320  Gross per 24 hour  Intake    820 ml  Output     1900 ml  Net  -1080 ml   General: NAD, sitting upright in bed Lungs: clear to auscultation b/l, decreased breath sounds on b/l lung bases compared to upper lung fields, no wheezing Cardiac: RRR, no murmurs GI: soft, active bowel sounds Neuro: CN II-XII grossly intact   Lab Results: Basic Metabolic Panel:  Recent Labs Lab 02/22/14 0319 02/22/14 1013 02/23/14 0421  NA 134*  --  139  K 4.1  --  3.5  CL 104  --  105  CO2 23  --  25  GLUCOSE 395*  --  171*  BUN 13  --  10  CREATININE 0.83 0.80 0.84  CALCIUM 8.5  --  8.6   Liver Function Tests:  Recent Labs Lab 02/23/14 0421  AST 33  ALT 42*  ALKPHOS 77  BILITOT 0.6  PROT 6.1  ALBUMIN 2.7*   CBC:  Recent Labs Lab 02/22/14 0319 02/22/14 1013 02/23/14 0421  WBC 7.4 7.2 5.8  NEUTROABS 4.0  --   --   HGB 11.7* 11.2* 10.8*  HCT 35.8* 34.1* 32.8*  MCV 83.6 82.6 82.8  PLT 232 209 216   Cardiac Enzymes:  Recent Labs Lab 02/22/14 1013 02/22/14 1550 02/22/14 2218  TROPONINI <0.03 0.03 <0.03   CBG:  Recent Labs Lab 02/22/14 0649 02/22/14 1203 02/22/14 1617 02/22/14 2204 02/23/14 0559 02/23/14 1136  GLUCAP 186* 246* 197* 203* 148* 192*   Hemoglobin A1C:  Recent Labs Lab 02/22/14 1013  HGBA1C 13.1*   Fasting Lipid Panel:  Recent Labs Lab 02/22/14 1046  CHOL 255*  HDL 56  LDLCALC 180*  TRIG 94  CHOLHDL 4.6    Thyroid Function Tests:  Recent Labs Lab 02/22/14 1030  TSH 1.870   Studies/Results: Dg Chest 2 View  02/22/2014   CLINICAL DATA:  Dyspnea. Shortness of breath and dry cough for 5 days. Nonsmoker.  EXAM: CHEST  2 VIEW  COMPARISON:  None.  FINDINGS: Shallow inspiration with infiltration or atelectasis in both lung bases. Blunting of the costophrenic angle suggesting small pleural effusions. Normal heart size and pulmonary vascularity. No pneumothorax. Degenerative changes in the spine and shoulders.  IMPRESSION: Infiltration or atelectasis in the lung bases with small pleural effusions.   Electronically Signed   By: [name] M.D.   On: 02/22/2014 03:49   Ct Angio Chest Pe W/cm &/or Wo Cm  02/22/2014   CLINICAL DATA:  Shortness breath for 5 days. Cough. Dyspnea. Increased shortness of breath when lying flat.  EXAM: CT ANGIOGRAPHY CHEST WITH CONTRAST  TECHNIQUE: Multidetector CT imaging of the chest was performed using the standard protocol during bolus administration of intravenous contrast. Multiplanar CT image reconstructions and MIPs were obtained to evaluate the vascular anatomy.  CONTRAST:  159mL OMNIPAQUE IOHEXOL 350 MG/ML SOLN  COMPARISON:  Two-view chest x-ray 02/22/2014.  FINDINGS: Pulmonary arterial opacification is excellent. There is no focal filling defect to suggest pulmonary embolus.  The heart is mildly enlarged. Coronary artery calcifications are evident.  Moderate sized bilateral pleural effusions are present. Multi focal airspace disease involves the lingula and lower lobes bilaterally. There is some scattered ground-glass attenuation in the upper lobes. There is a dependent atelectasis are noted as well.  No significant mediastinal or axillary adenopathy is present.  Mild endplate degenerative changes in some facet disease are noted throughout the thoracic spine. No focal lytic or blastic lesions are present.  Review of the MIP images confirms the above findings.   IMPRESSION: 1. Multi focal airspace disease compatible with pneumonia. 2. No pulmonary embolus. 3. Mild cardiomegaly. 4. Moderate bilateral pleural effusions. 5. Additional ground-glass attenuation suggests some element of edema as well. There is likely component of congestive heart failure associated.   Electronically Signed   By: [name] M.D.   On: 02/22/2014 07:19   Medications: I have reviewed the patient's current medications. Scheduled Meds: . [START ON 02/24/2014] aspirin  81 mg Oral Daily  . atorvastatin  80 mg Oral q1800  . furosemide  40 mg Intravenous BID  . heparin  5,000 Units Subcutaneous 3 times per day  . insulin aspart  0-9 Units Subcutaneous TID WC  . levofloxacin  500 mg Oral Daily  . lisinopril  2.5 mg Oral Daily  . sodium chloride  3 mL Intravenous Q12H  . sodium chloride  3 mL Intravenous Q12H   Continuous Infusions:  PRN Meds:.sodium chloride, acetaminophen **OR** acetaminophen, HYDROcodone-acetaminophen, ondansetron **OR** ondansetron (ZOFRAN) IV, sodium chloride Assessment/Plan: Principal Problem:   Acute dyspnea Active Problems:   Diabetes mellitus type 2 with complications, uncontrolled   Essential hypertension   Bilateral pleural effusion   CAP (community acquired pneumonia)   Dyspnea   Hyperglycemia   Acute systolic heart failure  CHF: ECHO reveals EF of 25-30% w/ no prior hx of heart failure. TSH WNL.  - Cardiology consulted, appreciate recommendations. Started lisinopril 2.5mg  qd, plan to start low dose BB. Will proceed w/ cardiac cath to look for CAD on Thursday. - continue IV diuresis w/ lasix 40mg  BID -strict I&O and daily weights -HIV neg -ASA 81mg  qd  CAP - continue IV levo  DM: Pt has been lost to f/u for the last year and hasn't been taking any mediciation.  -HgbA1c 13.1 w/ hx of uncontrolled DM - SSI, will calculate cumulative insulin requirements tomorrow and add night time coverage w/ lantus  HLD-- LDL 180 - lipitor  80mg   Dispo: Disposition is deferred at this time, awaiting improvement of current medical problems.    The patient does have a current PCP (Julia Arlyce Dice, MD) and does need an [facility] hospital follow-up appointment after discharge.  The patient does not have transportation limitations that hinder transportation to clinic appointments.  .Services Needed at time of discharge: Y = Yes, Blank = No PT:   OT:   RN:   Equipment:   Other:     LOS: 1 day   Julious Oka, MD 02/23/2014, 1:51 PM

## 2014-02-23 NOTE — Progress Notes (Signed)
Inpatient Diabetes Program Recommendations  AACE/ADA: New Consensus Statement on Inpatient Glycemic Control (2013)  Target Ranges:  Prepandial:   less than 140 mg/dL      Peak postprandial:   less than 180 mg/dL (1-2 hours)      Critically ill patients:  140 - 180 mg/dL  Results for [name], [name] (MRN [id number]) as of 02/23/2014 09:55  Ref. Range 02/22/2014 06:49 02/22/2014 12:03 02/22/2014 16:17 02/22/2014 22:04 02/23/2014 05:59  Glucose-Capillary Latest Range: 70-99 mg/dL 186 (H) 246 (H) 197 (H) 203 (H) 148 (H)   Outpatient Diabetes medications: Novolin 70/30 22 units w/ BF, 20 units w/ supper Current orders for Inpatient glycemic control: Novolog sensitive scale TID  Inpatient Diabetes Program Recommendations Insulin - Basal: consider adding a portion of patient's home dose 70/30 BID  HgbA1C: =13.1 will need F/U after discharge  Thank you  Raoul Pitch BSN, RN,CDE Inpatient Diabetes Coordinator [phone] (team pager)

## 2014-02-23 NOTE — Progress Notes (Signed)
Pt complained of heartburn. [name] paged and notified. Will order something for patient.

## 2014-02-23 NOTE — Consult Note (Signed)
Primary cardiologist: New  HPI: 64 year old female for evaluation of cardiomyopathy/congestive heart failure. Patient states that for the past 2 months she has had worsening dyspnea on exertion. This has been followed by orthopnea and PND. She does have pedal edema which she states is chronic. No chest pain. There has been subjective fever but no chills. Nonproductive cough. She has been admitted and cardiology asked to evaluate.  Medications Prior to Admission  Medication Sig Dispense Refill  . glucose blood (ONE TOUCH ULTRA TEST) test strip Check twice a day before giving insulin dx code 250.00 100 each 5  . glucose blood (RELION ULTIMA TEST) test strip Test your sugar once daily before your meal x 4 days, then test 4 times daily before meals x 4 days 100 each 12  . insulin NPH-regular (NOVOLIN 70/30) (70-30) 100 UNIT/ML injection Please inject 22units into skin 30 minutes before breakfast Then inject 20 units into skin 30 minutes before dinner (Patient not taking: Reported on 02/22/2014) 100 mL 12  . Insulin Syringe-Needle U-100 31G X 15/64" 0.3 ML MISC Use to inject insulin twice daily as directed dx code 250.00 100 each 5  . lisinopril (PRINIVIL,ZESTRIL) 20 MG tablet Take 1 tablet (20 mg total) by mouth daily. (Patient not taking: Reported on 02/22/2014) 30 tablet 5  . metFORMIN (GLUCOPHAGE) 1000 MG tablet Take 1 tablet (1,000 mg total) by mouth 2 (two) times daily with a meal. (Patient not taking: Reported on 02/22/2014) 60 tablet 3  . ONETOUCH DELICA LANCETS FINE MISC Check twice a day before giving insulin dx code 250.00 100 each 12  . simvastatin (ZOCOR) 20 MG tablet Take 1 tablet (20 mg total) by mouth at bedtime. (Patient not taking: Reported on 02/22/2014) 30 tablet 4    No Known Allergies  Past Medical History  Diagnosis Date  . Diabetes mellitus 1998    Dx in 1998. Microalbuminuria, never on insulin.  Marland Kitchen GERD (gastroesophageal reflux disease)   . Hyperlipidemia   .  Hypertension   . History of blurry vision 06/09    Hospitalized for this  . Personal history of colonic adenomas 10/07/2007  . Pneumonia 01/2014    Past Surgical History  Procedure Laterality Date  . Total abdominal hysterectomy w/ bilateral salpingoophorectomy  1996    History   Social History  . Marital Status: Widowed    Spouse Name: N/A    Number of Children: N/A  . Years of Education: N/A   Occupational History  . Not on file.   Social History Main Topics  . Smoking status: Never Smoker   . Smokeless tobacco: Never Used  . Alcohol Use: No  . Drug Use: No  . Sexual Activity: Not on file   Other Topics Concern  . Not on file   Social History Narrative   Married x 42 yrs.  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   Never smoked. Doesn't drink or use drugs.   Does Patient Exercise:  yes - sometimes      To get diabetes supplies for [name] RD, CDE, April 20,2011    Family History  Problem Relation Age of Onset  . Diabetes Mother     Mother died of MI at age 60  . Heart failure Mother   . Kidney failure Mother   . Heart failure Sister     Died  . Heart failure Brother     Died  . Diabetes Brother  Died    ROS:  Subjective fever and nonproductive cough but no hemoptysis, dysphasia, odynophagia, melena, hematochezia, dysuria, hematuria, rash, seizure activity, claudication. Remaining systems are negative.  Physical Exam:   Blood pressure 129/75, pulse 100, temperature 98.9 F (37.2 C), temperature source Oral, resp. rate 17, height 5' 2"  (1.575 m), weight 189 lb 2.5 oz (85.8 kg), SpO2 100 %.  General:  Well developed/well nourished in NAD Skin warm/dry Patient not depressed No peripheral clubbing Back-normal HEENT-normal/normal eyelids Neck supple/normal carotid upstroke bilaterally; no bruits; positive JVD; no thyromegaly chest - CTA/ normal expansion CV - RRR/normal S1 and S2; no  murmurs or gallops; positive S3 Abdomen -NT/ND, no HSM, no mass, + bowel sounds, no bruit 2+ femoral pulses, no bruits Ext-1+ edema, no chords, 2+ DP Neuro-grossly nonfocal  ECG sinus rhythm, left ventricular hypertrophy, nonspecific ST changes, prolonged QT interval.  Results for orders placed or performed during the hospital encounter of 02/22/14 (from the past 48 hour(s))  CBC with Differential     Status: Abnormal   Collection Time: 02/22/14  3:19 AM  Result Value Ref Range   WBC 7.4 4.0 - 10.5 K/uL   RBC 4.28 3.87 - 5.11 MIL/uL   Hemoglobin 11.7 (L) 12.0 - 15.0 g/dL   HCT 35.8 (L) 36.0 - 46.0 %   MCV 83.6 78.0 - 100.0 fL   MCH 27.3 26.0 - 34.0 pg   MCHC 32.7 30.0 - 36.0 g/dL   RDW 13.7 11.5 - 15.5 %   Platelets 232 150 - 400 K/uL   Neutrophils Relative % 53 43 - 77 %   Neutro Abs 4.0 1.7 - 7.7 K/uL   Lymphocytes Relative 40 12 - 46 %   Lymphs Abs 2.9 0.7 - 4.0 K/uL   Monocytes Relative 5 3 - 12 %   Monocytes Absolute 0.3 0.1 - 1.0 K/uL   Eosinophils Relative 2 0 - 5 %   Eosinophils Absolute 0.1 0.0 - 0.7 K/uL   Basophils Relative 0 0 - 1 %   Basophils Absolute 0.0 0.0 - 0.1 K/uL  Basic metabolic panel     Status: Abnormal   Collection Time: 02/22/14  3:19 AM  Result Value Ref Range   Sodium 134 (L) 135 - 145 mmol/L   Potassium 4.1 3.5 - 5.1 mmol/L   Chloride 104 96 - 112 mmol/L   CO2 23 19 - 32 mmol/L   Glucose, Bld 395 (H) 70 - 99 mg/dL   BUN 13 6 - 23 mg/dL   Creatinine, Ser 0.83 0.50 - 1.10 mg/dL   Calcium 8.5 8.4 - 10.5 mg/dL   GFR calc non Af Amer 73 (L) >90 mL/min   GFR calc Af Amer 85 (L) >90 mL/min    Comment: (NOTE) The eGFR has been calculated using the CKD EPI equation. This calculation has not been validated in all clinical situations. eGFR's persistently <90 mL/min signify possible Chronic Kidney Disease.    Anion gap 7 5 - 15  Brain natriuretic peptide     Status: Abnormal   Collection Time: 02/22/14  3:19 AM  Result Value Ref Range   B  Natriuretic Peptide 520.5 (H) 0.0 - 100.0 pg/mL  I-stat troponin, ED     Status: None   Collection Time: 02/22/14  3:29 AM  Result Value Ref Range   Troponin i, poc 0.01 0.00 - 0.08 ng/mL   Comment 3            Comment: Due to the release kinetics of  cTnI, a negative result within the first hours of the onset of symptoms does not rule out myocardial infarction with certainty. If myocardial infarction is still suspected, repeat the test at appropriate intervals.   CBG monitoring, ED     Status: Abnormal   Collection Time: 02/22/14  6:49 AM  Result Value Ref Range   Glucose-Capillary 186 (H) 70 - 99 mg/dL  CBC     Status: Abnormal   Collection Time: 02/22/14 10:13 AM  Result Value Ref Range   WBC 7.2 4.0 - 10.5 K/uL   RBC 4.13 3.87 - 5.11 MIL/uL   Hemoglobin 11.2 (L) 12.0 - 15.0 g/dL   HCT 34.1 (L) 36.0 - 46.0 %   MCV 82.6 78.0 - 100.0 fL   MCH 27.1 26.0 - 34.0 pg   MCHC 32.8 30.0 - 36.0 g/dL   RDW 13.8 11.5 - 15.5 %   Platelets 209 150 - 400 K/uL  Creatinine, serum     Status: Abnormal   Collection Time: 02/22/14 10:13 AM  Result Value Ref Range   Creatinine, Ser 0.80 0.50 - 1.10 mg/dL   GFR calc non Af Amer 77 (L) >90 mL/min   GFR calc Af Amer 89 (L) >90 mL/min    Comment: (NOTE) The eGFR has been calculated using the CKD EPI equation. This calculation has not been validated in all clinical situations. eGFR's persistently <90 mL/min signify possible Chronic Kidney Disease.   Troponin I     Status: None   Collection Time: 02/22/14 10:13 AM  Result Value Ref Range   Troponin I <0.03 <0.031 ng/mL    Comment:        NO INDICATION OF MYOCARDIAL INJURY.   Hemoglobin A1c     Status: Abnormal   Collection Time: 02/22/14 10:13 AM  Result Value Ref Range   Hgb A1c MFr Bld 13.1 (H) <5.7 %    Comment: (NOTE)                                                                       According to the ADA Clinical Practice Recommendations for 2011, when HbA1c is used as a  screening test:  >=6.5%   Diagnostic of Diabetes Mellitus           (if abnormal result is confirmed) 5.7-6.4%   Increased risk of developing Diabetes Mellitus References:Diagnosis and Classification of Diabetes Mellitus,Diabetes CHYI,5027,74(JOINO 1):S62-S69 and Standards of Medical Care in         Diabetes - 2011,Diabetes MVEH,2094,70 (Suppl 1):S11-S61.    Mean Plasma Glucose 329 (H) <117 mg/dL    Comment: Performed at [facility]  HIV antibody     Status: None   Collection Time: 02/22/14 10:13 AM  Result Value Ref Range   HIV 1/O/2 Abs-Index Value <1.00 <1.00    Comment: Index Value: Specimen reactivity relative to the negative cutoff.   HIV-1/HIV-2 Ab Non Reactive Non Reactive    Comment: (NOTE) Performed At: [facility] [place], [place] [id number] Lindon Romp MD [id number]   TSH     Status: None   Collection Time: 02/22/14 10:30 AM  Result Value Ref Range   TSH 1.870 0.350 - 4.500 uIU/mL  Lipid panel  Status: Abnormal   Collection Time: 02/22/14 10:46 AM  Result Value Ref Range   Cholesterol 255 (H) 0 - 200 mg/dL   Triglycerides 94 <150 mg/dL   HDL 56 >39 mg/dL   Total CHOL/HDL Ratio 4.6 RATIO   VLDL 19 0 - 40 mg/dL   LDL Cholesterol 180 (H) 0 - 99 mg/dL    Comment:        Total Cholesterol/HDL:CHD Risk Coronary Heart Disease Risk Table                     Men   Women  1/2 Average Risk   3.4   3.3  Average Risk       5.0   4.4  2 X Average Risk   9.6   7.1  3 X Average Risk  23.4   11.0        Use the calculated Patient Ratio above and the CHD Risk Table to determine the patient's CHD Risk.        ATP III CLASSIFICATION (LDL):  <100     mg/dL   Optimal  100-129  mg/dL   Near or Above                    Optimal  130-159  mg/dL   Borderline  160-189  mg/dL   High  >190     mg/dL   Very High   Glucose, capillary     Status: Abnormal   Collection Time: 02/22/14 12:03 PM  Result Value Ref Range   Glucose-Capillary  246 (H) 70 - 99 mg/dL   Comment 1 Notify RN   Troponin I     Status: None   Collection Time: 02/22/14  3:50 PM  Result Value Ref Range   Troponin I 0.03 <0.031 ng/mL    Comment:        NO INDICATION OF MYOCARDIAL INJURY.   Procalcitonin - Baseline     Status: None   Collection Time: 02/22/14  3:50 PM  Result Value Ref Range   Procalcitonin <0.10 ng/mL    Comment:        Interpretation: PCT (Procalcitonin) <= 0.5 ng/mL: Systemic infection (sepsis) is not likely. Local bacterial infection is possible. (NOTE)         ICU PCT Algorithm               Non ICU PCT Algorithm    ----------------------------     ------------------------------         PCT < 0.25 ng/mL                 PCT < 0.1 ng/mL     Stopping of antibiotics            Stopping of antibiotics       strongly encouraged.               strongly encouraged.    ----------------------------     ------------------------------       PCT level decrease by               PCT < 0.25 ng/mL       >= 80% from peak PCT       OR PCT 0.25 - 0.5 ng/mL          Stopping of antibiotics  encouraged.     Stopping of antibiotics           encouraged.    ----------------------------     ------------------------------       PCT level decrease by              PCT >= 0.25 ng/mL       < 80% from peak PCT        AND PCT >= 0.5 ng/mL            Continuin g antibiotics                                              encouraged.       Continuing antibiotics            encouraged.    ----------------------------     ------------------------------     PCT level increase compared          PCT > 0.5 ng/mL         with peak PCT AND          PCT >= 0.5 ng/mL             Escalation of antibiotics                                          strongly encouraged.      Escalation of antibiotics        strongly encouraged.   Glucose, capillary     Status: Abnormal   Collection Time: 02/22/14  4:17 PM  Result Value  Ref Range   Glucose-Capillary 197 (H) 70 - 99 mg/dL   Comment 1 Notify RN   Glucose, capillary     Status: Abnormal   Collection Time: 02/22/14 10:04 PM  Result Value Ref Range   Glucose-Capillary 203 (H) 70 - 99 mg/dL  Troponin I     Status: None   Collection Time: 02/22/14 10:18 PM  Result Value Ref Range   Troponin I <0.03 <0.031 ng/mL    Comment:        NO INDICATION OF MYOCARDIAL INJURY.   CBC     Status: Abnormal   Collection Time: 02/23/14  4:21 AM  Result Value Ref Range   WBC 5.8 4.0 - 10.5 K/uL   RBC 3.96 3.87 - 5.11 MIL/uL   Hemoglobin 10.8 (L) 12.0 - 15.0 g/dL   HCT 32.8 (L) 36.0 - 46.0 %   MCV 82.8 78.0 - 100.0 fL   MCH 27.3 26.0 - 34.0 pg   MCHC 32.9 30.0 - 36.0 g/dL   RDW 13.8 11.5 - 15.5 %   Platelets 216 150 - 400 K/uL  Comprehensive metabolic panel     Status: Abnormal   Collection Time: 02/23/14  4:21 AM  Result Value Ref Range   Sodium 139 135 - 145 mmol/L   Potassium 3.5 3.5 - 5.1 mmol/L   Chloride 105 96 - 112 mmol/L   CO2 25 19 - 32 mmol/L   Glucose, Bld 171 (H) 70 - 99 mg/dL   BUN 10 6 - 23 mg/dL   Creatinine, Ser 0.84 0.50 - 1.10 mg/dL   Calcium 8.6 8.4 - 10.5 mg/dL   Total Protein 6.1 6.0 - 8.3 g/dL   Albumin  2.7 (L) 3.5 - 5.2 g/dL   AST 33 0 - 37 U/L   ALT 42 (H) 0 - 35 U/L   Alkaline Phosphatase 77 39 - 117 U/L   Total Bilirubin 0.6 0.3 - 1.2 mg/dL   GFR calc non Af Amer 72 (L) >90 mL/min   GFR calc Af Amer 84 (L) >90 mL/min    Comment: (NOTE) The eGFR has been calculated using the CKD EPI equation. This calculation has not been validated in all clinical situations. eGFR's persistently <90 mL/min signify possible Chronic Kidney Disease.    Anion gap 9 5 - 15  Glucose, capillary     Status: Abnormal   Collection Time: 02/23/14  5:59 AM  Result Value Ref Range   Glucose-Capillary 148 (H) 70 - 99 mg/dL   Comment 1 Notify RN     Dg Chest 2 View  02/22/2014   CLINICAL DATA:  Dyspnea. Shortness of breath and dry cough for 5 days.  Nonsmoker.  EXAM: CHEST  2 VIEW  COMPARISON:  None.  FINDINGS: Shallow inspiration with infiltration or atelectasis in both lung bases. Blunting of the costophrenic angle suggesting small pleural effusions. Normal heart size and pulmonary vascularity. No pneumothorax. Degenerative changes in the spine and shoulders.  IMPRESSION: Infiltration or atelectasis in the lung bases with small pleural effusions.   Electronically Signed   By: Lucienne Capers M.D.   On: 02/22/2014 03:49   Ct Angio Chest Pe W/cm &/or Wo Cm  02/22/2014   CLINICAL DATA:  Shortness breath for 5 days. Cough. Dyspnea. Increased shortness of breath when lying flat.  EXAM: CT ANGIOGRAPHY CHEST WITH CONTRAST  TECHNIQUE: Multidetector CT imaging of the chest was performed using the standard protocol during bolus administration of intravenous contrast. Multiplanar CT image reconstructions and MIPs were obtained to evaluate the vascular anatomy.  CONTRAST:  124m OMNIPAQUE IOHEXOL 350 MG/ML SOLN  COMPARISON:  Two-view chest x-ray 02/22/2014.  FINDINGS: Pulmonary arterial opacification is excellent. There is no focal filling defect to suggest pulmonary embolus.  The heart is mildly enlarged. Coronary artery calcifications are evident.  Moderate sized bilateral pleural effusions are present. Multi focal airspace disease involves the lingula and lower lobes bilaterally. There is some scattered ground-glass attenuation in the upper lobes. There is a dependent atelectasis are noted as well.  No significant mediastinal or axillary adenopathy is present.  Mild endplate degenerative changes in some facet disease are noted throughout the thoracic spine. No focal lytic or blastic lesions are present.  Review of the MIP images confirms the above findings.  IMPRESSION: 1. Multi focal airspace disease compatible with pneumonia. 2. No pulmonary embolus. 3. Mild cardiomegaly. 4. Moderate bilateral pleural effusions. 5. Additional ground-glass attenuation suggests  some element of edema as well. There is likely component of congestive heart failure associated.   Electronically Signed   By: CLawrence SantiagoM.D.   On: 02/22/2014 07:19    Assessment/Plan 1 cardiomyopathy-etiology unclear. TSH normal. No history of alcohol abuse. Possible contribution from hypertension. Will add lisinopril 2.5 mg daily and titrate as tolerated. Once congestive heart failure improves add low-dose beta blocker and increase as needed. Plan to proceed with cardiac catheterization to exclude coronary disease given long history of diabetes mellitus. The risks and benefits were discussed and she agrees to proceed. We will most likely proceed on Thursday once her heart failure improves. Once medications are fully titrated +/- revascularization she would require repeat echocardiogram 3 months later to assess LV function. If ejection fraction  less than 35% would need to consider ICD. 2 acute systolic congestive heart failure-she is volume overloaded on examination. Treat with Lasix 40 mg IV twice a day and follow renal function. Add lisinopril as outlined above. Add beta blocker later once his CHF improves. Would also add spironolactone later once renal function stable. 3 hyperlipidemia-would resume statin. Add aspirin. 4 diabetes mellitus-management per primary care. 5 hypertension-we will titrate cardiac medications both to treat her cardiomyopathy and her blood pressure.  Kirk Ruths MD 02/23/2014, 10:57 AM

## 2014-02-23 NOTE — Care Management Note (Addendum)
    Page 1 of 2   03/06/2014     1:37:48 PM CARE MANAGEMENT NOTE 03/06/2014  Patient:  [name], [name]   Account Number:  [id number]  Date Initiated:  02/23/2014  Documentation initiated by:  [facility]  Subjective/Objective Assessment:   a 64 year old woman with history of DM, HTN, hyperlipidemia, GERD, and other problems as outlined in the medical history, admitted with complaint of SOB.//Home with children     Action/Plan:   IV antibody treatment with levofloxacin pending results of blood and sputum cultures; supplement oxygen and follow saturations.//Access for discharge needs.   Anticipated DC Date:  02/27/2014   Anticipated DC Plan:  [place]  CM consult      Choice offered to / List presented to:             Status of service:  In process, will continue to follow Medicare Important Message given?   (If response is "NO", the following Medicare IM given date fields will be blank) Date Medicare IM given:   Medicare IM given by:   Date Additional Medicare IM given:   Additional Medicare IM given by:    Discharge Disposition:  HOME/SELF CARE  Per UR Regulation:  Reviewed for med. necessity/level of care/duration of stay  If discussed at [place] of Stay Meetings, dates discussed:   03/02/2014    Comments:  Contact:  Silva,Julia Daughter [phone]      [name], [name] [phone]  [phone]  03-06-14 1335 CM had received a call from Life vest rep that an order had been placed for a life vest before the pt had surgery. CM did call Erin PA in ref to if pt needed lifevest and she stated she was not aware of this. No further needs from CM at this time.   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  Talked with daughter [name] and patient who is awake and alert.  Per patient lives at home with daughter and is independent - daughter nods agreement. Plan for discharge home with daughter who  confirms she will be with her mom 24/7.  CM will continue to follow.  02/25/14 Ellan Lambert, RN, BSN [phone] Pt for cath tomorrow; may need Lifevest.  Will cont to follow.

## 2014-02-24 DIAGNOSIS — E119 Type 2 diabetes mellitus without complications: Secondary | ICD-10-CM

## 2014-02-24 DIAGNOSIS — J90 Pleural effusion, not elsewhere classified: Secondary | ICD-10-CM

## 2014-02-24 DIAGNOSIS — I10 Essential (primary) hypertension: Secondary | ICD-10-CM

## 2014-02-24 DIAGNOSIS — E118 Type 2 diabetes mellitus with unspecified complications: Secondary | ICD-10-CM

## 2014-02-24 LAB — BASIC METABOLIC PANEL
ANION GAP: 9 (ref 5–15)
BUN: 9 mg/dL (ref 6–23)
CO2: 27 mmol/L (ref 19–32)
CREATININE: 0.96 mg/dL (ref 0.50–1.10)
Calcium: 8.8 mg/dL (ref 8.4–10.5)
Chloride: 101 mmol/L (ref 96–112)
GFR calc Af Amer: 71 mL/min — ABNORMAL LOW (ref >90)
GFR calc non Af Amer: 62 mL/min — ABNORMAL LOW (ref >90)
Glucose, Bld: 123 mg/dL — ABNORMAL HIGH (ref 70–99)
Potassium: 3.7 mmol/L (ref 3.5–5.1)
Sodium: 137 mmol/L (ref 135–145)

## 2014-02-24 LAB — CBC WITH DIFFERENTIAL/PLATELET
BASOS PCT: 0 % (ref 0–1)
Basophils Absolute: 0 10*3/uL (ref 0.0–0.1)
EOS PCT: 3 % (ref 0–5)
Eosinophils Absolute: 0.1 10*3/uL (ref 0.0–0.7)
HCT: 32.1 % — ABNORMAL LOW (ref 36.0–46.0)
HEMOGLOBIN: 10.4 g/dL — AB (ref 12.0–15.0)
Lymphocytes Relative: 50 % — ABNORMAL HIGH (ref 12–46)
Lymphs Abs: 2.3 10*3/uL (ref 0.7–4.0)
MCH: 26.7 pg (ref 26.0–34.0)
MCHC: 32.4 g/dL (ref 30.0–36.0)
MCV: 82.5 fL (ref 78.0–100.0)
Monocytes Absolute: 0.3 10*3/uL (ref 0.1–1.0)
Monocytes Relative: 7 % (ref 3–12)
Neutro Abs: 1.9 10*3/uL (ref 1.7–7.7)
Neutrophils Relative %: 40 % — ABNORMAL LOW (ref 43–77)
PLATELETS: 194 10*3/uL (ref 150–400)
RBC: 3.89 MIL/uL (ref 3.87–5.11)
RDW: 13.9 % (ref 11.5–15.5)
WBC: 4.7 10*3/uL (ref 4.0–10.5)

## 2014-02-24 LAB — FOLATE

## 2014-02-24 LAB — RETICULOCYTES
RBC.: 3.93 MIL/uL (ref 3.87–5.11)
RETIC COUNT ABSOLUTE: 59 10*3/uL (ref 19.0–186.0)
Retic Ct Pct: 1.5 % (ref 0.4–3.1)

## 2014-02-24 LAB — GLUCOSE, CAPILLARY
Glucose-Capillary: 123 mg/dL — ABNORMAL HIGH (ref 70–99)
Glucose-Capillary: 232 mg/dL — ABNORMAL HIGH (ref 70–99)
Glucose-Capillary: 129 mg/dL — ABNORMAL HIGH (ref 70–99)
Glucose-Capillary: 162 mg/dL — ABNORMAL HIGH (ref 70–99)

## 2014-02-24 LAB — IRON AND TIBC
Iron: 63 ug/dL (ref 42–145)
Saturation Ratios: 31 % (ref 20–55)
TIBC: 206 ug/dL — ABNORMAL LOW (ref 250–470)
UIBC: 143 ug/dL (ref 125–400)

## 2014-02-24 LAB — PROCALCITONIN: Procalcitonin: <0.1 ng/mL

## 2014-02-24 LAB — CK: Total CK: 88 U/L (ref 7–177)

## 2014-02-24 LAB — VITAMIN B12: Vitamin B-12: 350 pg/mL (ref 211–911)

## 2014-02-24 LAB — FERRITIN: Ferritin: 225 ng/mL (ref 10–291)

## 2014-02-24 MED ORDER — INSULIN GLARGINE 100 UNIT/ML ~~LOC~~ SOLN
8.0000 [IU] | Freq: Every day | SUBCUTANEOUS | Status: DC
  Administered 2014-02-24 – 2014-02-27 (×4): 8 [IU] via SUBCUTANEOUS
  Filled 2014-02-24 (×4): qty 0.08

## 2014-02-24 MED ORDER — CARVEDILOL 3.125 MG PO TABS
3.1250 mg | ORAL_TABLET | Freq: Two times a day (BID) | ORAL | Status: DC
  Administered 2014-02-25 – 2014-02-28 (×7): 3.125 mg via ORAL
  Filled 2014-02-24 (×12): qty 1

## 2014-02-24 NOTE — Progress Notes (Signed)
Patient Name: [name] Date of Encounter: 02/24/2014     Principal Problem:   Acute dyspnea Active Problems:   Diabetes mellitus type 2 with complications, uncontrolled   Essential hypertension   Bilateral pleural effusion   CAP (community acquired pneumonia)   Dyspnea   Hyperglycemia   Acute systolic heart failure    SUBJECTIVE  Denies any CP, SOB improving but not back to baseline.  CURRENT MEDS . aspirin  81 mg Oral Daily  . atorvastatin  80 mg Oral q1800  . furosemide  40 mg Intravenous BID  . heparin  5,000 Units Subcutaneous 3 times per day  . insulin aspart  0-9 Units Subcutaneous TID WC  . levofloxacin  500 mg Oral Daily  . lisinopril  2.5 mg Oral Daily  . pantoprazole  20 mg Oral Daily  . sodium chloride  3 mL Intravenous Q12H  . sodium chloride  3 mL Intravenous Q12H    OBJECTIVE  Filed Vitals:   02/23/14 1433 02/23/14 2141 02/24/14 0152 02/24/14 0458  BP: 131/84 134/80 140/81 145/84  Pulse: 101 100 103 104  Temp: 97.9 F (36.6 C) 98 F (36.7 C) 98.5 F (36.9 C) 99.2 F (37.3 C)  TempSrc: Oral Oral Oral Oral  Resp: 18 17 18 18   Height:      Weight:    187 lb 11.2 oz (85.14 kg)  SpO2: 98% 100% 99% 98%    Intake/Output Summary (Last 24 hours) at 02/24/14 1234 Last data filed at 02/24/14 1206  Gross per 24 hour  Intake    780 ml  Output   2150 ml  Net  -1370 ml   Filed Weights   02/22/14 0911 02/23/14 0534 02/24/14 0458  Weight: 192 lb 10.9 oz (87.4 kg) 189 lb 2.5 oz (85.8 kg) 187 lb 11.2 oz (85.14 kg)    PHYSICAL EXAM  General: Pleasant, NAD. Neuro: Alert and oriented X 3. Moves all extremities spontaneously. Psych: Normal affect. HEENT:  Normal  Neck: Supple without bruits  +JVD. Lungs:  Resp regular and unlabored. decreased bibasilar breath sound.  Heart: RRR no s3, s4, or murmurs. Abdomen: Soft, non-tender, non-distended, BS + x 4.  Extremities: No clubbing, cyanosis. DP/PT/Radials 2+ and equal bilaterally. 2+ pitting  edema  Accessory Clinical Findings  CBC  Recent Labs  02/22/14 0319  02/23/14 0421 02/24/14 0405  WBC 7.4  < > 5.8 4.7  NEUTROABS 4.0  --   --  1.9  HGB 11.7*  < > 10.8* 10.4*  HCT 35.8*  < > 32.8* 32.1*  MCV 83.6  < > 82.8 82.5  PLT 232  < > 216 194  < > = values in this interval not displayed. Basic Metabolic Panel  Recent Labs  02/23/14 0421 02/24/14 0405  NA 139 137  K 3.5 3.7  CL 105 101  CO2 25 27  GLUCOSE 171* 123*  BUN 10 9  CREATININE 0.84 0.96  CALCIUM 8.6 8.8   Liver Function Tests  Recent Labs  02/23/14 0421  AST 33  ALT 42*  ALKPHOS 77  BILITOT 0.6  PROT 6.1  ALBUMIN 2.7*   Cardiac Enzymes  Recent Labs  02/22/14 1013 02/22/14 1550 02/22/14 2218 02/24/14 0405  CKTOTAL  --   --   --  3  TROPONINI <0.03 0.03 <0.03  --    Hemoglobin A1C  Recent Labs  02/22/14 1013  HGBA1C 13.1*   Fasting Lipid Panel  Recent Labs  02/22/14 1046  CHOL 255*  HDL 56  LDLCALC 180*  TRIG 94  CHOLHDL 4.6   Thyroid Function Tests  Recent Labs  02/22/14 1030  TSH 1.870    TELE Sinus tach with HR 90-100    ECG  No new EKG  Echocardiogram 02/22/2014  LV EF: 25% -  30%  ------------------------------------------------------------------- History:  PMH: Bilateral Pleural Effusion. Dyspnea. Risk factors: Hypertension. Diabetes mellitus. Dyslipidemia.  ------------------------------------------------------------------- Study Conclusions  - Left ventricle: The cavity size was normal. Wall thickness was increased in a pattern of mild LVH. Systolic function was severely reduced. The estimated ejection fraction was in the range of 25% to 30%. Diffuse hypokinesis. - Mitral valve: There was mild to moderate regurgitation. - Left atrium: The atrium was mildly dilated. - Right ventricle: The cavity size was mildly dilated. Systolic function was moderately reduced. - Right atrium: The atrium was mildly dilated. - Tricuspid  valve: There was mild-moderate regurgitation. - Pericardium, extracardiac: A small pericardial effusion was identified posterior to the heart. There was a left pleural effusion.     Radiology/Studies  Dg Chest 2 View  02/22/2014   CLINICAL DATA:  Dyspnea. Shortness of breath and dry cough for 5 days. Nonsmoker.  EXAM: CHEST  2 VIEW  COMPARISON:  None.  FINDINGS: Shallow inspiration with infiltration or atelectasis in both lung bases. Blunting of the costophrenic angle suggesting small pleural effusions. Normal heart size and pulmonary vascularity. No pneumothorax. Degenerative changes in the spine and shoulders.  IMPRESSION: Infiltration or atelectasis in the lung bases with small pleural effusions.   Electronically Signed   By: Lucienne Capers M.D.   On: 02/22/2014 03:49   Ct Angio Chest Pe W/cm &/or Wo Cm  02/22/2014   CLINICAL DATA:  Shortness breath for 5 days. Cough. Dyspnea. Increased shortness of breath when lying flat.  EXAM: CT ANGIOGRAPHY CHEST WITH CONTRAST  TECHNIQUE: Multidetector CT imaging of the chest was performed using the standard protocol during bolus administration of intravenous contrast. Multiplanar CT image reconstructions and MIPs were obtained to evaluate the vascular anatomy.  CONTRAST:  132mL OMNIPAQUE IOHEXOL 350 MG/ML SOLN  COMPARISON:  Two-view chest x-ray 02/22/2014.  FINDINGS: Pulmonary arterial opacification is excellent. There is no focal filling defect to suggest pulmonary embolus.  The heart is mildly enlarged. Coronary artery calcifications are evident.  Moderate sized bilateral pleural effusions are present. Multi focal airspace disease involves the lingula and lower lobes bilaterally. There is some scattered ground-glass attenuation in the upper lobes. There is a dependent atelectasis are noted as well.  No significant mediastinal or axillary adenopathy is present.  Mild endplate degenerative changes in some facet disease are noted throughout the thoracic  spine. No focal lytic or blastic lesions are present.  Review of the MIP images confirms the above findings.  IMPRESSION: 1. Multi focal airspace disease compatible with pneumonia. 2. No pulmonary embolus. 3. Mild cardiomegaly. 4. Moderate bilateral pleural effusions. 5. Additional ground-glass attenuation suggests some element of edema as well. There is likely component of congestive heart failure associated.   Electronically Signed   By: Lawrence Santiago M.D.   On: 02/22/2014 07:19    ASSESSMENT AND PLAN  1 cardiomyopathy  - etiology unclear. TSH normal. No history of alcohol abuse. Possible contribution from hypertension.   - ACEI added, plan to add coreg once HF resolve   - still fluid overloaded, continue IV lasix. Add coreg tomorrow.  - will need L and R heart cath, likely Friday. if no PCI, will need echo in 3 month,  if EF continue to be low, will consider ICD  - add spironolactone later if tolerating BB and ACEI  2 acute systolic congestive heart failure-see above  3 hyperlipidemia-would resume statin. Add aspirin.  4 diabetes mellitus-management per primary care.  5 hypertension-we will titrate cardiac medications both to treat her cardiomyopathy and her blood pressure.   Hilbert Corrigan PA-C Pager: [phone]   Agree with note by Almyra Deforest PA-C  Newly Dx CM. LVEF 20%. Diuresing. Will need to be on Coreg, ACE-I and diuretic. Still has 1-2+ pitting edema. Can't lie flat yet.Agree with R/L heart cath on Friday to R/O an ischemic etiology   Lorretta Harp, M.D., [facility], [place] [facility] [address] [place], [place]  [address]  [phone] 02/24/2014 1:34 PM

## 2014-02-24 NOTE — Progress Notes (Addendum)
Inpatient Diabetes Program Recommendations  AACE/ADA: New Consensus Statement on Inpatient Glycemic Control (2013)  Target Ranges:  Prepandial:   less than 140 mg/dL      Peak postprandial:   less than 180 mg/dL (1-2 hours)      Critically ill patients:  140 - 180 mg/dL  Results for [name], [name] (MRN [id number]) as of 02/24/2014 14:04  Ref. Range 02/24/2014 05:28 02/24/2014 12:09  Glucose-Capillary Latest Range: 70-99 mg/dL 123 (H) 232 (H)   Inpatient Diabetes Program Recommendations Insulin - Basal: Lantus 8 currently ordered (will start tonight) Insulin - Meal Coverage: consider adding Novolog 4 units TID with meals per Glycemic Control order set for postprandial elevations HgbA1C: =13.1 will need F/U after discharge   Addendum:Late entry: this coordinator met with patient 1/27 to discuss elevated A1C.  Pt reports she knows she needs to have better control.  Left information for basic diabetes meal planning. No questions/concerns at the end of our visit. Thank you  Raoul Pitch BSN, RN,CDE Inpatient Diabetes Coordinator [phone] (team pager)

## 2014-02-24 NOTE — Progress Notes (Signed)
Subjective: Pt doing well, slept well overnight. States breathing has improved.  Objective: Vital signs in last 24 hours: Filed Vitals:   02/23/14 1433 02/23/14 2141 02/24/14 0152 02/24/14 0458  BP: 131/84 134/80 140/81 145/84  Pulse: 101 100 103 104  Temp: 97.9 F (36.6 C) 98 F (36.7 C) 98.5 F (36.9 C) 99.2 F (37.3 C)  TempSrc: Oral Oral Oral Oral  Resp: 18 17 18 18   Height:      Weight:    187 lb 11.2 oz (85.14 kg)  SpO2: 98% 100% 99% 98%   Weight change: -4 lb 15.7 oz (-2.26 kg)  Intake/Output Summary (Last 24 hours) at 02/24/14 1320 Last data filed at 02/24/14 1206  Gross per 24 hour  Intake    660 ml  Output   2150 ml  Net  -1490 ml   General: NAD, sitting upright in bed Lungs: clear to auscultation b/l, decreased breath sounds on rt lung base, no wheezing Cardiac: RRR, no murmurs GI: soft, active bowel sounds Neuro: CN II-XII grossly intact Ext: 2+ pitting edema   Lab Results: Basic Metabolic Panel:  Recent Labs Lab 02/23/14 0421 02/24/14 0405  NA 139 137  K 3.5 3.7  CL 105 101  CO2 25 27  GLUCOSE 171* 123*  BUN 10 9  CREATININE 0.84 0.96  CALCIUM 8.6 8.8   Liver Function Tests:  Recent Labs Lab 02/23/14 0421  AST 33  ALT 42*  ALKPHOS 77  BILITOT 0.6  PROT 6.1  ALBUMIN 2.7*   CBC:  Recent Labs Lab 02/22/14 0319  02/23/14 0421 02/24/14 0405  WBC 7.4  < > 5.8 4.7  NEUTROABS 4.0  --   --  1.9  HGB 11.7*  < > 10.8* 10.4*  HCT 35.8*  < > 32.8* 32.1*  MCV 83.6  < > 82.8 82.5  PLT 232  < > 216 194  < > = values in this interval not displayed. Cardiac Enzymes:  Recent Labs Lab 02/22/14 1013 02/22/14 1550 02/22/14 2218 02/24/14 0405  CKTOTAL  --   --   --  88  TROPONINI <0.03 0.03 <0.03  --    CBG:  Recent Labs Lab 02/23/14 0559 02/23/14 1136 02/23/14 1702 02/23/14 2115 02/24/14 0528 02/24/14 1209  GLUCAP 148* 192* 176* 142* 123* 232*   Hemoglobin A1C:  Recent Labs Lab 02/22/14 1013  HGBA1C 13.1*    Fasting Lipid Panel:  Recent Labs Lab 02/22/14 1046  CHOL 255*  HDL 56  LDLCALC 180*  TRIG 94  CHOLHDL 4.6   Thyroid Function Tests:  Recent Labs Lab 02/22/14 1030  TSH 1.870   Studies/Results: No results found. Medications: I have reviewed the patient's current medications. Scheduled Meds: . aspirin  81 mg Oral Daily  . atorvastatin  80 mg Oral q1800  . furosemide  40 mg Intravenous BID  . heparin  5,000 Units Subcutaneous 3 times per day  . insulin aspart  0-9 Units Subcutaneous TID WC  . insulin glargine  8 Units Subcutaneous QHS  . levofloxacin  500 mg Oral Daily  . lisinopril  2.5 mg Oral Daily  . pantoprazole  20 mg Oral Daily  . sodium chloride  3 mL Intravenous Q12H  . sodium chloride  3 mL Intravenous Q12H   Continuous Infusions:  PRN Meds:.sodium chloride, acetaminophen **OR** acetaminophen, HYDROcodone-acetaminophen, ondansetron **OR** ondansetron (ZOFRAN) IV, sodium chloride Assessment/Plan: Principal Problem:   Acute dyspnea Active Problems:   Diabetes mellitus type 2 with complications, uncontrolled  Essential hypertension   Bilateral pleural effusion   CAP (community acquired pneumonia)   Dyspnea   Hyperglycemia   Acute systolic heart failure  CHF: ECHO reveals EF of 25-30% w/ no prior hx of heart failure. TSH WNL.  - Cardiology consulted, appreciate recommendations.  lisinopril 2.5mg  qd, plan to start low dose BB. Will proceed w/ cardiac cath to look for CAD on Thursday or Friday  - continue IV diuresis w/ lasix 40mg  BID -strict I&O and daily weights -ASA 81mg  qd  CAP - continue IV levo  DM: Pt has been lost to f/u for the last year and hasn't been taking any mediciation.  -HgbA1c 13.1 w/ hx of uncontrolled DM - SSI - lantus 8 units qhs  HLD-- LDL 180 - lipitor 80mg   Dispo: Disposition is deferred at this time, awaiting improvement of current medical problems.    The patient does have a current PCP ([name], MD) and  does need an [facility] hospital follow-up appointment after discharge.  The patient does not have transportation limitations that hinder transportation to clinic appointments.  .Services Needed at time of discharge: Y = Yes, Blank = No PT:   OT:   RN:   Equipment:   Other:     LOS: 2 days   Julious Oka, MD 02/24/2014, 1:20 PM

## 2014-02-25 LAB — BASIC METABOLIC PANEL
ANION GAP: 8 (ref 5–15)
BUN: 10 mg/dL (ref 6–23)
CO2: 29 mmol/L (ref 19–32)
Calcium: 8.7 mg/dL (ref 8.4–10.5)
Chloride: 103 mmol/L (ref 96–112)
Creatinine, Ser: 1 mg/dL (ref 0.50–1.10)
GFR calc Af Amer: 68 mL/min — ABNORMAL LOW (ref >90)
GFR, EST NON AFRICAN AMERICAN: 59 mL/min — AB (ref >90)
GLUCOSE: 119 mg/dL — AB (ref 70–99)
Potassium: 3.5 mmol/L (ref 3.5–5.1)
SODIUM: 140 mmol/L (ref 135–145)

## 2014-02-25 LAB — GLUCOSE, CAPILLARY
GLUCOSE-CAPILLARY: 122 mg/dL — AB (ref 70–99)
GLUCOSE-CAPILLARY: 285 mg/dL — AB (ref 70–99)
Glucose-Capillary: 125 mg/dL — ABNORMAL HIGH (ref 70–99)
Glucose-Capillary: 214 mg/dL — ABNORMAL HIGH (ref 70–99)

## 2014-02-25 MED ORDER — SODIUM CHLORIDE 0.9 % IV SOLN: 250.0000 mL | INTRAVENOUS | Status: DC | PRN

## 2014-02-25 MED ORDER — SODIUM CHLORIDE 0.9 % IV SOLN
Freq: Once | INTRAVENOUS | Status: AC
  Administered 2014-02-26: 06:00:00 via INTRAVENOUS

## 2014-02-25 MED ORDER — ASPIRIN 81 MG PO CHEW
81.0000 mg | CHEWABLE_TABLET | ORAL | Status: AC
  Administered 2014-02-26: 81 mg via ORAL
  Filled 2014-02-25: qty 1

## 2014-02-25 MED ORDER — SODIUM CHLORIDE 0.9 % IJ SOLN
3.0000 mL | Freq: Two times a day (BID) | INTRAMUSCULAR | Status: DC
  Administered 2014-02-25: 3 mL via INTRAVENOUS

## 2014-02-25 MED ORDER — SODIUM CHLORIDE 0.9 % IJ SOLN
3.0000 mL | INTRAMUSCULAR | Status: DC | PRN
Start: 2014-02-25 — End: 2014-02-26

## 2014-02-25 MED ORDER — FUROSEMIDE 10 MG/ML IJ SOLN
40.0000 mg | Freq: Once | INTRAMUSCULAR | Status: AC
  Administered 2014-02-25: 40 mg via INTRAVENOUS

## 2014-02-25 NOTE — Progress Notes (Signed)
Patient Name: [name] Date of Encounter: 02/25/2014     Principal Problem:   Acute dyspnea Active Problems:   Diabetes mellitus type 2 with complications, uncontrolled   Essential hypertension   Bilateral pleural effusion   CAP (community acquired pneumonia)   Dyspnea   Hyperglycemia   Acute systolic heart failure    SUBJECTIVE  Denies any CP, SOB improving but not back to baseline.  CURRENT MEDS . aspirin  81 mg Oral Daily  . atorvastatin  80 mg Oral q1800  . carvedilol  3.125 mg Oral BID WC  . furosemide  40 mg Intravenous BID  . heparin  5,000 Units Subcutaneous 3 times per day  . insulin aspart  0-9 Units Subcutaneous TID WC  . insulin glargine  8 Units Subcutaneous QHS  . levofloxacin  500 mg Oral Daily  . lisinopril  2.5 mg Oral Daily  . pantoprazole  20 mg Oral Daily  . sodium chloride  3 mL Intravenous Q12H  . sodium chloride  3 mL Intravenous Q12H    OBJECTIVE  Filed Vitals:   02/24/14 0458 02/24/14 1527 02/24/14 2139 02/25/14 0633  BP: 145/84 128/80 127/76 109/60  Pulse: 104 101 99 98  Temp: 99.2 F (37.3 C) 98.7 F (37.1 C) 98.3 F (36.8 C) 98.2 F (36.8 C)  TempSrc: Oral Oral Oral Oral  Resp: 18 18 18 17   Height:      Weight: 187 lb 11.2 oz (85.14 kg)   186 lb 1.1 oz (84.4 kg)  SpO2: 98% 96% 99% 100%    Intake/Output Summary (Last 24 hours) at 02/25/14 1158 Last data filed at 02/25/14 0954  Gross per 24 hour  Intake    480 ml  Output   1550 ml  Net  -1070 ml   Filed Weights   02/23/14 0534 02/24/14 0458 02/25/14 0633  Weight: 189 lb 2.5 oz (85.8 kg) 187 lb 11.2 oz (85.14 kg) 186 lb 1.1 oz (84.4 kg)    PHYSICAL EXAM  General: Pleasant, NAD. Neuro: Alert and oriented X 3. Moves all extremities spontaneously. Psych: Normal affect. HEENT:  Normal  Neck: Supple without bruits  +JVD. Lungs:  Resp regular and unlabored. decreased bibasilar breath sound, with basilar rale  Heart: RRR no s3, s4, or murmurs. Abdomen: Soft,  non-tender, non-distended, BS + x 4.  Extremities: No clubbing, cyanosis. DP/PT/Radials 2+ and equal bilaterally. 1+ pitting edema  Accessory Clinical Findings  CBC  Recent Labs  02/23/14 0421 02/24/14 0405  WBC 5.8 4.7  NEUTROABS  --  1.9  HGB 10.8* 10.4*  HCT 32.8* 32.1*  MCV 82.8 82.5  PLT 216 [id number]   Basic Metabolic Panel  Recent Labs  02/23/14 0421 02/24/14 0405  NA 139 137  K 3.5 3.7  CL 105 101  CO2 25 27  GLUCOSE 171* 123*  BUN 10 9  CREATININE 0.84 0.96  CALCIUM 8.6 8.8   Liver Function Tests  Recent Labs  02/23/14 0421  AST 33  ALT 42*  ALKPHOS 77  BILITOT 0.6  PROT 6.1  ALBUMIN 2.7*   Cardiac Enzymes  Recent Labs  02/22/14 1550 02/22/14 2218 02/24/14 0405  CKTOTAL  --   --  88  TROPONINI 0.03 <0.03  --     TELE Sinus tach with HR 90    ECG  No new EKG  Echocardiogram 02/22/2014  LV EF: 25% -  30%  ------------------------------------------------------------------- History:  PMH: Bilateral Pleural Effusion. Dyspnea. Risk factors: Hypertension. Diabetes mellitus. Dyslipidemia.  -------------------------------------------------------------------  Study Conclusions  - Left ventricle: The cavity size was normal. Wall thickness was increased in a pattern of mild LVH. Systolic function was severely reduced. The estimated ejection fraction was in the range of 25% to 30%. Diffuse hypokinesis. - Mitral valve: There was mild to moderate regurgitation. - Left atrium: The atrium was mildly dilated. - Right ventricle: The cavity size was mildly dilated. Systolic function was moderately reduced. - Right atrium: The atrium was mildly dilated. - Tricuspid valve: There was mild-moderate regurgitation. - Pericardium, extracardiac: A small pericardial effusion was identified posterior to the heart. There was a left pleural effusion.     Radiology/Studies  Dg Chest 2 View  02/22/2014   CLINICAL DATA:  Dyspnea. Shortness  of breath and dry cough for 5 days. Nonsmoker.  EXAM: CHEST  2 VIEW  COMPARISON:  None.  FINDINGS: Shallow inspiration with infiltration or atelectasis in both lung bases. Blunting of the costophrenic angle suggesting small pleural effusions. Normal heart size and pulmonary vascularity. No pneumothorax. Degenerative changes in the spine and shoulders.  IMPRESSION: Infiltration or atelectasis in the lung bases with small pleural effusions.   Electronically Signed   By: Lucienne Capers M.D.   On: 02/22/2014 03:49   Ct Angio Chest Pe W/cm &/or Wo Cm  02/22/2014   CLINICAL DATA:  Shortness breath for 5 days. Cough. Dyspnea. Increased shortness of breath when lying flat.  EXAM: CT ANGIOGRAPHY CHEST WITH CONTRAST  TECHNIQUE: Multidetector CT imaging of the chest was performed using the standard protocol during bolus administration of intravenous contrast. Multiplanar CT image reconstructions and MIPs were obtained to evaluate the vascular anatomy.  CONTRAST:  122mL OMNIPAQUE IOHEXOL 350 MG/ML SOLN  COMPARISON:  Two-view chest x-ray 02/22/2014.  FINDINGS: Pulmonary arterial opacification is excellent. There is no focal filling defect to suggest pulmonary embolus.  The heart is mildly enlarged. Coronary artery calcifications are evident.  Moderate sized bilateral pleural effusions are present. Multi focal airspace disease involves the lingula and lower lobes bilaterally. There is some scattered ground-glass attenuation in the upper lobes. There is a dependent atelectasis are noted as well.  No significant mediastinal or axillary adenopathy is present.  Mild endplate degenerative changes in some facet disease are noted throughout the thoracic spine. No focal lytic or blastic lesions are present.  Review of the MIP images confirms the above findings.  IMPRESSION: 1. Multi focal airspace disease compatible with pneumonia. 2. No pulmonary embolus. 3. Mild cardiomegaly. 4. Moderate bilateral pleural effusions. 5. Additional  ground-glass attenuation suggests some element of edema as well. There is likely component of congestive heart failure associated.   Electronically Signed   By: Lawrence Santiago M.D.   On: 02/22/2014 07:19    ASSESSMENT AND PLAN  1 cardiomyopathy  - etiology unclear. TSH normal. No history of alcohol abuse. Possible contribution from hypertension.   - ACEI added, plan to add coreg once HF resolve   - still mildly fluid overloaded, continue IV lasix for now with 1 more dose this afternoon, hold lasix tomorrow AM, further diuretic based on RHC result. Coreg added, unable to uptitrate  - I have scheduled L&RHC for tomorrow with Dr. Tamala Julian. if no PCI, will need echo in 3 month, if EF continue to be low, will consider ICD  - add spironolactone later if tolerating BB and ACEI  2 acute systolic congestive heart failure-see above  3 hyperlipidemia-would resume statin. Add aspirin.  4 diabetes mellitus-management per primary care.  5 hypertension-we will titrate cardiac medications  both to treat her cardiomyopathy and her blood pressure.   Hilbert Corrigan PA-C Pager: [phone]  Agree with note by Almyra Deforest PA-C  Diuresing well. Appears comfortable and euvolemic. Exam benign.Will arrange R/L heart cath with Dr. Tamala Julian tomorrow.    Lorretta Harp, M.D., [place], [facility], [place] [facility] [address] [place], [place]  [address]  [phone] 02/25/2014 1:47 PM

## 2014-02-25 NOTE — Progress Notes (Signed)
Subjective:   Patient reports that she continues to do very well and has no complaints this morning. She states that her breathing is quite good. Patient states that she is ready for her cardiac catheterization.  Objective: Vital signs in last 24 hours: Filed Vitals:   02/24/14 1527 02/24/14 2139 02/25/14 0633 02/25/14 1345  BP: 128/80 127/76 109/60 113/93  Pulse: 101 99 98 98  Temp: 98.7 F (37.1 C) 98.3 F (36.8 C) 98.2 F (36.8 C) 97.8 F (36.6 C)  TempSrc: Oral Oral Oral Oral  Resp: 18 18 17 18   Height:      Weight:   186 lb 1.1 oz (84.4 kg)   SpO2: 96% 99% 100% 98%   Weight change: -1 lb 10.1 oz (-0.74 kg)  Intake/Output Summary (Last 24 hours) at 02/25/14 1649 Last data filed at 02/25/14 1628  Gross per 24 hour  Intake    240 ml  Output   2000 ml  Net  -1760 ml   General: NAD, sitting upright in bed Lungs: clear to auscultation b/l, bibasilar Rales, decreased breath sounds in bases Cardiac: RRR, no murmurs GI: soft, active bowel sounds, nontender, nondistended Neuro: CN II-XII grossly intact Ext: 1+ pitting edema, pulses intact  Lab Results: Basic Metabolic Panel:  Recent Labs Lab 02/24/14 0405 02/25/14 1234  NA 137 140  K 3.7 3.5  CL 101 103  CO2 27 29  GLUCOSE 123* 119*  BUN 9 10  CREATININE 0.96 1.00  CALCIUM 8.8 8.7   Liver Function Tests:  Recent Labs Lab 02/23/14 0421  AST 33  ALT 42*  ALKPHOS 77  BILITOT 0.6  PROT 6.1  ALBUMIN 2.7*   CBC:  Recent Labs Lab 02/22/14 0319  02/23/14 0421 02/24/14 0405  WBC 7.4  < > 5.8 4.7  NEUTROABS 4.0  --   --  1.9  HGB 11.7*  < > 10.8* 10.4*  HCT 35.8*  < > 32.8* 32.1*  MCV 83.6  < > 82.8 82.5  PLT 232  < > 216 194  < > = values in this interval not displayed. Cardiac Enzymes:  Recent Labs Lab 02/22/14 1013 02/22/14 1550 02/22/14 2218 02/24/14 0405  CKTOTAL  --   --   --  88  TROPONINI <0.03 0.03 <0.03  --    CBG:  Recent Labs Lab 02/24/14 0528 02/24/14 1209  02/24/14 1713 02/24/14 2134 02/25/14 0629 02/25/14 1110  GLUCAP 123* 232* 129* 162* 125* 122*   Hemoglobin A1C:  Recent Labs Lab 02/22/14 1013  HGBA1C 13.1*   Fasting Lipid Panel:  Recent Labs Lab 02/22/14 1046  CHOL 255*  HDL 56  LDLCALC 180*  TRIG 94  CHOLHDL 4.6   Thyroid Function Tests:  Recent Labs Lab 02/22/14 1030  TSH 1.870   Studies/Results: No results found. Medications: I have reviewed the patient's current medications. Scheduled Meds: . [START ON 02/26/2014] sodium chloride   Intravenous Once  . aspirin  81 mg Oral Daily  . atorvastatin  80 mg Oral q1800  . carvedilol  3.125 mg Oral BID WC  . heparin  5,000 Units Subcutaneous 3 times per day  . insulin aspart  0-9 Units Subcutaneous TID WC  . insulin glargine  8 Units Subcutaneous QHS  . levofloxacin  500 mg Oral Daily  . lisinopril  2.5 mg Oral Daily  . pantoprazole  20 mg Oral Daily  . sodium chloride  3 mL Intravenous Q12H  . sodium chloride  3 mL Intravenous Q12H  Continuous Infusions:  PRN Meds:.sodium chloride, acetaminophen **OR** acetaminophen, HYDROcodone-acetaminophen, ondansetron **OR** ondansetron (ZOFRAN) IV, sodium chloride Assessment/Plan: Principal Problem:   Acute dyspnea Active Problems:   Diabetes mellitus type 2 with complications, uncontrolled   Essential hypertension   Bilateral pleural effusion   CAP (community acquired pneumonia)   Dyspnea   Hyperglycemia   Acute systolic heart failure  Acute systolic congestive heart failure: Patient continues to diuresis with 1 L net negative over the last 24 hours with a 2.6 L since admission. Patient's weight is down to 186 from 192 on admission. ECHO reveals EF of 25-30% w/ no prior hx of heart failure. TSH WNL. She does have persistent lower extremity edema along with bibasilar Rales. Etiology of cardiomyopathy is unclear and patient is planned for a left and right heart catheterization tomorrow. -Coreg at 3.125 mg twice a  day -Lisinopril 2.5 mg daily -Lasix 40 mg one more time today given slight fluid overload -Consider adding spironolactone at a later point once tolerating beta blocker and ACE inhibitor. -Aspirin 81 mg daily -Strict I & os and daily weights -Appreciate cardiology recommendations  Hyperlipidemia: LDL of 180.  -80 mg atorvastatin  Community-acquired pneumonia -Continue PO levofloxacin.  DM: CBGs have been well controlled in the low 100s. Pt has been lost to f/u for the last year and hasn't been taking any mediciation.  -HgbA1c 13.1 w/ hx of uncontrolled DM - SSI - lantus 8 units qhs  Dispo: Disposition is deferred at this time, awaiting improvement of current medical problems.     The patient does have a current PCP (Julia Arlyce Dice, MD) and does need an [facility] hospital follow-up appointment after discharge.  The patient does not have transportation limitations that hinder transportation to clinic appointments.  .Services Needed at time of discharge: Y = Yes, Blank = No PT:   OT:   RN:   Equipment:   Other:     LOS: 3 days   Luan Moore, MD 02/25/2014, 4:49 PM

## 2014-02-25 NOTE — Progress Notes (Signed)
Internal Medicine Attending  Date: 02/25/2014  Patient name: [name] Medical record number: [id number] Date of birth: 1950/03/29 Age: 64 y.o. Gender: female  I saw and evaluated the patient. I discussed patient and reviewed the resident's note by Dr. Raelene Bott, and I agree with the resident's findings and plans as documented in his note.

## 2014-02-25 NOTE — Progress Notes (Signed)
Pt requesting to shower tonight. Pt in SR/ST on monitor, independent. New order from Dr. Ethelene Hal that pt may come of telemetry for shower.

## 2014-02-26 ENCOUNTER — Encounter (HOSPITAL_COMMUNITY)
Admission: EM | Disposition: A | Discharge status: Home Health | Payer: Self-pay | Source: Home / Self Care | Attending: Internal Medicine

## 2014-02-26 ENCOUNTER — Other Ambulatory Visit: Payer: Self-pay | Admitting: *Deleted

## 2014-02-26 ENCOUNTER — Inpatient Hospital Stay (HOSPITAL_COMMUNITY): Payer: BC Managed Care – PPO

## 2014-02-26 ENCOUNTER — Encounter (HOSPITAL_COMMUNITY): Payer: Self-pay | Admitting: Interventional Cardiology

## 2014-02-26 DIAGNOSIS — I2510 Atherosclerotic heart disease of native coronary artery without angina pectoris: Secondary | ICD-10-CM

## 2014-02-26 HISTORY — PX: LEFT AND RIGHT HEART CATHETERIZATION WITH CORONARY ANGIOGRAM: SHX5449

## 2014-02-26 LAB — BASIC METABOLIC PANEL
Anion gap: 8 (ref 5–15)
BUN: 13 mg/dL (ref 6–23)
CO2: 30 mmol/L (ref 19–32)
Calcium: 8.8 mg/dL (ref 8.4–10.5)
Chloride: 101 mmol/L (ref 96–112)
Creatinine, Ser: 1.03 mg/dL (ref 0.50–1.10)
GFR calc Af Amer: 66 mL/min — ABNORMAL LOW (ref >90)
GFR, EST NON AFRICAN AMERICAN: 57 mL/min — AB (ref >90)
Glucose, Bld: 175 mg/dL — ABNORMAL HIGH (ref 70–99)
POTASSIUM: 3.9 mmol/L (ref 3.5–5.1)
Sodium: 139 mmol/L (ref 135–145)

## 2014-02-26 LAB — CBC
HCT: 32.5 % — ABNORMAL LOW (ref 36.0–46.0)
HEMOGLOBIN: 10.8 g/dL — AB (ref 12.0–15.0)
MCH: 26.9 pg (ref 26.0–34.0)
MCHC: 33.2 g/dL (ref 30.0–36.0)
MCV: 81 fL (ref 78.0–100.0)
PLATELETS: 202 10*3/uL (ref 150–400)
RBC: 4.01 MIL/uL (ref 3.87–5.11)
RDW: 14.3 % (ref 11.5–15.5)
WBC: 4.3 10*3/uL (ref 4.0–10.5)

## 2014-02-26 LAB — PROTIME-INR
INR: 1 (ref 0.00–1.49)
PROTHROMBIN TIME: 13.3 s (ref 11.6–15.2)

## 2014-02-26 LAB — PULMONARY FUNCTION TEST
FEF 25-75 Pre: 1.8 L/sec
FEF2575-%Pred-Pre: 100 %
FEV1-%Pred-Pre: 61 %
FEV1-Pre: 1.11 L
FEV1FVC-%Pred-Pre: 114 %
FEV6-%Pred-Pre: 54 %
FEV6-Pre: 1.23 L
FEV6FVC-%Pred-Pre: 104 %
FVC-%Pred-Pre: 52 %
FVC-Pre: 1.23 L
Pre FEV1/FVC ratio: 91 %
Pre FEV6/FVC Ratio: 100 %

## 2014-02-26 LAB — GLUCOSE, CAPILLARY
Glucose-Capillary: 149 mg/dL — ABNORMAL HIGH (ref 70–99)
Glucose-Capillary: 134 mg/dL — ABNORMAL HIGH (ref 70–99)
Glucose-Capillary: 238 mg/dL — ABNORMAL HIGH (ref 70–99)
Glucose-Capillary: 273 mg/dL — ABNORMAL HIGH (ref 70–99)

## 2014-02-26 LAB — POCT I-STAT 3, VENOUS BLOOD GAS (G3P V)
Acid-Base Excess: 2 mmol/L (ref 0.0–2.0)
Bicarbonate: 27.5 mEq/L — ABNORMAL HIGH (ref 20.0–24.0)
O2 Saturation: 58 %
PCO2 VEN: 48.6 mmHg (ref 45.0–50.0)
TCO2: 29 mmol/L (ref 0–100)
pH, Ven: 7.361 — ABNORMAL HIGH (ref 7.250–7.300)
pO2, Ven: 32 mmHg (ref 30.0–45.0)

## 2014-02-26 LAB — PROCALCITONIN: Procalcitonin: <0.1 ng/mL

## 2014-02-26 LAB — CREATININE, SERUM
CREATININE: 0.88 mg/dL (ref 0.50–1.10)
GFR calc Af Amer: 79 mL/min — ABNORMAL LOW (ref >90)
GFR, EST NON AFRICAN AMERICAN: 68 mL/min — AB (ref >90)

## 2014-02-26 LAB — POCT I-STAT 3, ART BLOOD GAS (G3+)
BICARBONATE: 25.2 meq/L — AB (ref 20.0–24.0)
O2 SAT: 94 %
TCO2: 26 mmol/L (ref 0–100)
pCO2 arterial: 42.1 mmHg (ref 35.0–45.0)
pH, Arterial: 7.386 (ref 7.350–7.450)
pO2, Arterial: 74 mmHg — ABNORMAL LOW (ref 80.0–100.0)

## 2014-02-26 SURGERY — LEFT AND RIGHT HEART CATHETERIZATION WITH CORONARY ANGIOGRAM
Anesthesia: LOCAL

## 2014-02-26 MED ORDER — ALBUTEROL SULFATE (2.5 MG/3ML) 0.083% IN NEBU
2.5000 mg | INHALATION_SOLUTION | Freq: Once | RESPIRATORY_TRACT | Status: AC
  Administered 2014-02-26: 2.5 mg via RESPIRATORY_TRACT

## 2014-02-26 MED ORDER — FENTANYL CITRATE 0.05 MG/ML IJ SOLN
INTRAMUSCULAR | Status: AC
  Filled 2014-02-26: qty 2

## 2014-02-26 MED ORDER — VERAPAMIL HCL 2.5 MG/ML IV SOLN
INTRAVENOUS | Status: AC
  Filled 2014-02-26: qty 2

## 2014-02-26 MED ORDER — HEPARIN SODIUM (PORCINE) 1000 UNIT/ML IJ SOLN
INTRAMUSCULAR | Status: AC
  Filled 2014-02-26: qty 1

## 2014-02-26 MED ORDER — ACETAMINOPHEN 325 MG PO TABS
650.0000 mg | ORAL_TABLET | ORAL | Status: DC | PRN
  Administered 2014-02-26: 650 mg via ORAL

## 2014-02-26 MED ORDER — LIDOCAINE HCL (PF) 1 % IJ SOLN
INTRAMUSCULAR | Status: AC
  Filled 2014-02-26: qty 30

## 2014-02-26 MED ORDER — MIDAZOLAM HCL 2 MG/2ML IJ SOLN
INTRAMUSCULAR | Status: AC
  Filled 2014-02-26: qty 2

## 2014-02-26 MED ORDER — ENOXAPARIN SODIUM 40 MG/0.4ML ~~LOC~~ SOLN
40.0000 mg | SUBCUTANEOUS | Status: DC
  Administered 2014-02-27 – 2014-02-28 (×2): 40 mg via SUBCUTANEOUS
  Filled 2014-02-26 (×4): qty 0.4

## 2014-02-26 MED ORDER — HEPARIN (PORCINE) IN NACL 2-0.9 UNIT/ML-% IJ SOLN
INTRAMUSCULAR | Status: AC
  Filled 2014-02-26: qty 1500

## 2014-02-26 MED ORDER — ONDANSETRON HCL 4 MG/2ML IJ SOLN
4.0000 mg | Freq: Four times a day (QID) | INTRAMUSCULAR | Status: DC | PRN
  Administered 2014-02-26: 4 mg via INTRAVENOUS
  Filled 2014-02-26: qty 2

## 2014-02-26 MED ORDER — NITROGLYCERIN 1 MG/10 ML FOR IR/CATH LAB
INTRA_ARTERIAL | Status: AC
  Filled 2014-02-26: qty 10

## 2014-02-26 MED ORDER — SODIUM CHLORIDE 0.9 % IV SOLN
INTRAVENOUS | Status: AC
  Administered 2014-02-26: 10:00:00 via INTRAVENOUS

## 2014-02-26 NOTE — H&P (View-Only) (Signed)
Patient Name: [name] Date of Encounter: 02/25/2014     Principal Problem:   Acute dyspnea Active Problems:   Diabetes mellitus type 2 with complications, uncontrolled   Essential hypertension   Bilateral pleural effusion   CAP (community acquired pneumonia)   Dyspnea   Hyperglycemia   Acute systolic heart failure    SUBJECTIVE  Denies any CP, SOB improving but not back to baseline.  CURRENT MEDS . aspirin  81 mg Oral Daily  . atorvastatin  80 mg Oral q1800  . carvedilol  3.125 mg Oral BID WC  . furosemide  40 mg Intravenous BID  . heparin  5,000 Units Subcutaneous 3 times per day  . insulin aspart  0-9 Units Subcutaneous TID WC  . insulin glargine  8 Units Subcutaneous QHS  . levofloxacin  500 mg Oral Daily  . lisinopril  2.5 mg Oral Daily  . pantoprazole  20 mg Oral Daily  . sodium chloride  3 mL Intravenous Q12H  . sodium chloride  3 mL Intravenous Q12H    OBJECTIVE  Filed Vitals:   02/24/14 0458 02/24/14 1527 02/24/14 2139 02/25/14 0633  BP: 145/84 128/80 127/76 109/60  Pulse: 104 101 99 98  Temp: 99.2 F (37.3 C) 98.7 F (37.1 C) 98.3 F (36.8 C) 98.2 F (36.8 C)  TempSrc: Oral Oral Oral Oral  Resp: 18 18 18 17   Height:      Weight: 187 lb 11.2 oz (85.14 kg)   186 lb 1.1 oz (84.4 kg)  SpO2: 98% 96% 99% 100%    Intake/Output Summary (Last 24 hours) at 02/25/14 1158 Last data filed at 02/25/14 0954  Gross per 24 hour  Intake    480 ml  Output   1550 ml  Net  -1070 ml   Filed Weights   02/23/14 0534 02/24/14 0458 02/25/14 0633  Weight: 189 lb 2.5 oz (85.8 kg) 187 lb 11.2 oz (85.14 kg) 186 lb 1.1 oz (84.4 kg)    PHYSICAL EXAM  General: Pleasant, NAD. Neuro: Alert and oriented X 3. Moves all extremities spontaneously. Psych: Normal affect. HEENT:  Normal  Neck: Supple without bruits  +JVD. Lungs:  Resp regular and unlabored. decreased bibasilar breath sound, with basilar rale  Heart: RRR no s3, s4, or murmurs. Abdomen: Soft,  non-tender, non-distended, BS + x 4.  Extremities: No clubbing, cyanosis. DP/PT/Radials 2+ and equal bilaterally. 1+ pitting edema  Accessory Clinical Findings  CBC  Recent Labs  02/23/14 0421 02/24/14 0405  WBC 5.8 4.7  NEUTROABS  --  1.9  HGB 10.8* 10.4*  HCT 32.8* 32.1*  MCV 82.8 82.5  PLT 216 [id number]   Basic Metabolic Panel  Recent Labs  02/23/14 0421 02/24/14 0405  NA 139 137  K 3.5 3.7  CL 105 101  CO2 25 27  GLUCOSE 171* 123*  BUN 10 9  CREATININE 0.84 0.96  CALCIUM 8.6 8.8   Liver Function Tests  Recent Labs  02/23/14 0421  AST 33  ALT 42*  ALKPHOS 77  BILITOT 0.6  PROT 6.1  ALBUMIN 2.7*   Cardiac Enzymes  Recent Labs  02/22/14 1550 02/22/14 2218 02/24/14 0405  CKTOTAL  --   --  88  TROPONINI 0.03 <0.03  --     TELE Sinus tach with HR 90    ECG  No new EKG  Echocardiogram 02/22/2014  LV EF: 25% -  30%  ------------------------------------------------------------------- History:  PMH: Bilateral Pleural Effusion. Dyspnea. Risk factors: Hypertension. Diabetes mellitus. Dyslipidemia.  -------------------------------------------------------------------  Study Conclusions  - Left ventricle: The cavity size was normal. Wall thickness was increased in a pattern of mild LVH. Systolic function was severely reduced. The estimated ejection fraction was in the range of 25% to 30%. Diffuse hypokinesis. - Mitral valve: There was mild to moderate regurgitation. - Left atrium: The atrium was mildly dilated. - Right ventricle: The cavity size was mildly dilated. Systolic function was moderately reduced. - Right atrium: The atrium was mildly dilated. - Tricuspid valve: There was mild-moderate regurgitation. - Pericardium, extracardiac: A small pericardial effusion was identified posterior to the heart. There was a left pleural effusion.     Radiology/Studies  Dg Chest 2 View  02/22/2014   CLINICAL DATA:  Dyspnea. Shortness  of breath and dry cough for 5 days. Nonsmoker.  EXAM: CHEST  2 VIEW  COMPARISON:  None.  FINDINGS: Shallow inspiration with infiltration or atelectasis in both lung bases. Blunting of the costophrenic angle suggesting small pleural effusions. Normal heart size and pulmonary vascularity. No pneumothorax. Degenerative changes in the spine and shoulders.  IMPRESSION: Infiltration or atelectasis in the lung bases with small pleural effusions.   Electronically Signed   By: Lucienne Capers M.D.   On: 02/22/2014 03:49   Ct Angio Chest Pe W/cm &/or Wo Cm  02/22/2014   CLINICAL DATA:  Shortness breath for 5 days. Cough. Dyspnea. Increased shortness of breath when lying flat.  EXAM: CT ANGIOGRAPHY CHEST WITH CONTRAST  TECHNIQUE: Multidetector CT imaging of the chest was performed using the standard protocol during bolus administration of intravenous contrast. Multiplanar CT image reconstructions and MIPs were obtained to evaluate the vascular anatomy.  CONTRAST:  156mL OMNIPAQUE IOHEXOL 350 MG/ML SOLN  COMPARISON:  Two-view chest x-ray 02/22/2014.  FINDINGS: Pulmonary arterial opacification is excellent. There is no focal filling defect to suggest pulmonary embolus.  The heart is mildly enlarged. Coronary artery calcifications are evident.  Moderate sized bilateral pleural effusions are present. Multi focal airspace disease involves the lingula and lower lobes bilaterally. There is some scattered ground-glass attenuation in the upper lobes. There is a dependent atelectasis are noted as well.  No significant mediastinal or axillary adenopathy is present.  Mild endplate degenerative changes in some facet disease are noted throughout the thoracic spine. No focal lytic or blastic lesions are present.  Review of the MIP images confirms the above findings.  IMPRESSION: 1. Multi focal airspace disease compatible with pneumonia. 2. No pulmonary embolus. 3. Mild cardiomegaly. 4. Moderate bilateral pleural effusions. 5. Additional  ground-glass attenuation suggests some element of edema as well. There is likely component of congestive heart failure associated.   Electronically Signed   By: Lawrence Santiago M.D.   On: 02/22/2014 07:19    ASSESSMENT AND PLAN  1 cardiomyopathy  - etiology unclear. TSH normal. No history of alcohol abuse. Possible contribution from hypertension.   - ACEI added, plan to add coreg once HF resolve   - still mildly fluid overloaded, continue IV lasix for now with 1 more dose this afternoon, hold lasix tomorrow AM, further diuretic based on RHC result. Coreg added, unable to uptitrate  - I have scheduled L&RHC for tomorrow with Dr. Tamala Julian. if no PCI, will need echo in 3 month, if EF continue to be low, will consider ICD  - add spironolactone later if tolerating BB and ACEI  2 acute systolic congestive heart failure-see above  3 hyperlipidemia-would resume statin. Add aspirin.  4 diabetes mellitus-management per primary care.  5 hypertension-we will titrate cardiac medications  both to treat her cardiomyopathy and her blood pressure.   Hilbert Corrigan PA-C Pager: [phone]  Agree with note by Almyra Deforest PA-C  Diuresing well. Appears comfortable and euvolemic. Exam benign.Will arrange R/L heart cath with Dr. Tamala Julian tomorrow.    Lorretta Harp, M.D., [place], [facility], [place] [address] [place], [place]  [address]  [phone] 02/25/2014 1:47 PM

## 2014-02-26 NOTE — CV Procedure (Signed)
Left and Right Heart Catheterization with Coronary Angiography  Report  [name]  64 y.o.  female 10-21-1950  Procedure Date: 02/26/2014 Referring Physician: Quay Burow, M.D. Primary Cardiologist: Quay Burow, M.D.  INDICATIONS: Acute systolic heart failure of uncertain etiology in a patient with multiple risk factors for coronary artery disease.  PROCEDURE: 1. Left heart catheterization; 2. Right heart catheterization; 3. Coronary angiography; 4. Left ventriculography  CONSENT:  The risks, benefits, and details of the procedure were explained in detail to the patient. Risks including death, stroke, heart attack, kidney injury, allergy, limb ischemia, bleeding and radiation injury were discussed.  The patient verbalized understanding and wanted to proceed.  Informed written consent was obtained.  PROCEDURE TECHNIQUE:  After Xylocaine anesthesia a 5 French arterial and 6 French venous sheaths were placed in the right femoral  Artery and vein respectively using the modified Seldinger technique.  Right heart catheterization was performed with a 6 French Swan-Ganz catheter. A main pulmonary artery O2 saturation was obtained. Coronary angiography was done using a 5 F A2 MP diagnostic catheter.  Left ventriculography was done using the A2 MP catheter and hand injection.   Images were reviewed.  Arterial hemostasis was achieved with a Vascade closure device. The venous site was controlled with manual compression.   CONTRAST:  Total of 85 cc.  COMPLICATIONS:  None   HEMODYNAMICS:  Aortic pressure 108/59 mmHg; LV pressure 108/6 mmHg; LVEDP 21 mmHg; RA 4 mmHg; RV 32/7 mmHg; PA 36/13 mmHg; PCWP(mean) 14 mmHg; Cardiac Output 4.83 L/m (Fick) and 3.83 L/m (thermodilution); AV gradient none  ANGIOGRAPHIC DATA:   Cinefluoroscopy demonstrated heavy three-vessel calcification   The left main coronary artery is short and widely patent. It branches into the LAD and circumflex.    The left anterior descending artery is heavily calcified. There is diffuse disease starting at the ostium. In the distal portion of the proximal vessel there is a segmental 80-90% obstruction. This is followed by an 80-90% mid obstruction. The LAD is very large and wraps around the left ventricular apex. 2 early rising diagonal branches are diffusely diseased. The ostium of the second diagonal is 80% obstructed. The LAD supplies collaterals to the distal right coronary, in particular a moderate sized acute marginal branch.   The left circumflex artery is Stone occluded after a small first obtuse marginal branch. 2 distal obtuse marginals can be seen to fill by left to left collaterals. These branches appear relatively small. It appears that the diagonal branches noted above supply much of the lateral wall. Whether the circumflex is graftable is a question that causes concern.   The right coronary artery is totally occluded in the mid vessel. The small PDA and a left ventricular branch are filled by left-to-right collaterals from the LAD.  LEFT VENTRICULOGRAM:  Left ventricular angiogram was done in the 30 RAO projection and revealed a dilated left ventricular cavity with global hypokinesis and an estimated ejection fraction of 30-35%.   IMPRESSIONS:  1. Severe three-vessel coronary artery disease with total occlusion of the proximal to mid circumflex, total occlusion of the mid RCA, and high grade multifocal obstruction in the proximal and mid LAD. The LAD supplies collaterals to the circumflex and right coronary territories. The second diagonal contains high-grade proximal disease. 2. Ischemic cardiomyopathy with ejection fraction of 30-35%.    RECOMMENDATION:  Revascularization with coronary bypass surgery would seem appropriate. I have left a message on voice mail at [facility] with Levonne Spiller, to obtain  a surgical consultation for consideration of CABG

## 2014-02-26 NOTE — Progress Notes (Signed)
Subjective: Pt doing well this morning. Saw pt before and after cath procedure.    Objective: Vital signs in last 24 hours: Filed Vitals:   02/25/14 2110 02/26/14 0557 02/26/14 0847 02/26/14 1009  BP: 131/87 108/67  114/70  Pulse: 104 99 99 91  Temp: 98.6 F (37 C) 98.4 F (36.9 C)    TempSrc: Oral Oral    Resp: 18 18    Height:      Weight:  183 lb 6.8 oz (83.2 kg)    SpO2: 100% 99%     Weight change: -2 lb 10.3 oz (-1.2 kg)  Intake/Output Summary (Last 24 hours) at 02/26/14 1105 Last data filed at 02/26/14 0725  Gross per 24 hour  Intake    120 ml  Output   1550 ml  Net  -1430 ml   General: NAD Lungs:  bibasilar Rales, no wheezes Cardiac: RRR, no murmurs GI: soft, active bowel sounds, nontender, nondistended Neuro: CN II-XII grossly intact Ext: 2+ pitting edema, pulses intact  Lab Results: Basic Metabolic Panel:  Recent Labs Lab 02/25/14 1234 02/26/14 0415  NA 140 139  K 3.5 3.9  CL 103 101  CO2 29 30  GLUCOSE 119* 175*  BUN 10 13  CREATININE 1.00 1.03  CALCIUM 8.7 8.8   Liver Function Tests:  Recent Labs Lab 02/23/14 0421  AST 33  ALT 42*  ALKPHOS 77  BILITOT 0.6  PROT 6.1  ALBUMIN 2.7*   CBC:  Recent Labs Lab 02/22/14 0319  02/23/14 0421 02/24/14 0405  WBC 7.4  < > 5.8 4.7  NEUTROABS 4.0  --   --  1.9  HGB 11.7*  < > 10.8* 10.4*  HCT 35.8*  < > 32.8* 32.1*  MCV 83.6  < > 82.8 82.5  PLT 232  < > 216 194  < > = values in this interval not displayed. Cardiac Enzymes:  Recent Labs Lab 02/22/14 1013 02/22/14 1550 02/22/14 2218 02/24/14 0405  CKTOTAL  --   --   --  88  TROPONINI <0.03 0.03 <0.03  --    CBG:  Recent Labs Lab 02/24/14 2134 02/25/14 0629 02/25/14 1110 02/25/14 1626 02/25/14 2111 02/26/14 0554  GLUCAP 162* 125* 122* 214* 285* 149*   Hemoglobin A1C:  Recent Labs Lab 02/22/14 1013  HGBA1C 13.1*   Fasting Lipid Panel:  Recent Labs Lab 02/22/14 1046  CHOL 255*  HDL 56  LDLCALC 180*  TRIG 94    CHOLHDL 4.6   Thyroid Function Tests:  Recent Labs Lab 02/22/14 1030  TSH 1.870   Studies/Results: No results found. Medications: I have reviewed the patient's current medications. Scheduled Meds: . aspirin  81 mg Oral Daily  . atorvastatin  80 mg Oral q1800  . carvedilol  3.125 mg Oral BID WC  . [START ON 02/27/2014] enoxaparin (LOVENOX) injection  40 mg Subcutaneous Q24H  . insulin aspart  0-9 Units Subcutaneous TID WC  . insulin glargine  8 Units Subcutaneous QHS  . levofloxacin  500 mg Oral Daily  . lisinopril  2.5 mg Oral Daily  . pantoprazole  20 mg Oral Daily   Continuous Infusions: . sodium chloride     PRN Meds:.acetaminophen, HYDROcodone-acetaminophen, ondansetron (ZOFRAN) IV Assessment/Plan: Principal Problem:   Acute dyspnea Active Problems:   Diabetes mellitus type 2 with complications, uncontrolled   Essential hypertension   Bilateral pleural effusion   CAP (community acquired pneumonia)   Dyspnea   Hyperglycemia   Acute systolic heart failure  CAD in native artery  Acute systolic congestive heart failure: pt 183lbs down from 186 lbs yesterday. Net - 4.2 L since admission. Breathing has improved.  -Coreg at 3.125 mg twice a day -Lisinopril 2.5 mg daily -Consider adding spironolactone at a later point once tolerating beta blocker and ACE inhibitor. -Aspirin 81 mg daily -Strict I & os and daily weights -Appreciate cardiology recommendations  Coronary artery disease-- cath this morning revealed 1. Severe three-vessel coronary artery disease with total occlusion of the proximal to mid circumflex, total occlusion of the mid RCA, and high grade multifocal obstruction in the proximal and mid LAD. The LAD supplies collaterals to the circumflex and right coronary territories. The second diagonal contains high-grade proximal disease. 2. Ischemic cardiomyopathy with ejection fraction of 30-35%.  - Dr. Tamala Julian with cardiology recommends coronary bypass sx. CT sx to  speak w/ patient today  Hyperlipidemia: LDL of 180.  -80 mg atorvastatin  Community-acquired pneumonia -Continue PO levofloxacin.  [id number] 13.1 w/ hx of uncontrolled DM - SSI - lantus 8 units qhs  Dispo: Disposition is deferred at this time, awaiting improvement of current medical problems.     The patient does have a current PCP ([name], MD) and does need an [facility] hospital follow-up appointment after discharge.  The patient does not have transportation limitations that hinder transportation to clinic appointments.  .Services Needed at time of discharge: Y = Yes, Blank = No PT:   OT:   RN:   Equipment:   Other:     LOS: 4 days   Julious Oka, MD 02/26/2014, 11:05 AM

## 2014-02-26 NOTE — Progress Notes (Signed)
Subjective:  Pt groggy from cath. No CP/SOB  Objective:  Temp:  [97.8 F (36.6 C)-98.6 F (37 C)] 98.4 F (36.9 C) (01/29 0557) Pulse Rate:  [91-104] 91 (01/29 1009) Resp:  [18] 18 (01/29 0557) BP: (108-131)/(67-93) 114/70 mmHg (01/29 1009) SpO2:  [98 %-100 %] 99 % (01/29 0557) Weight:  [183 lb 6.8 oz (83.2 kg)] 183 lb 6.8 oz (83.2 kg) (01/29 0557) Weight change: -2 lb 10.3 oz (-1.2 kg)  Intake/Output from previous day: 01/28 0701 - 01/29 0700 In: 120 [P.O.:120] Out: 1650 [Urine:1650]  Intake/Output from this shift:    Physical Exam: General appearance: alert and no distress Neck: no adenopathy, no carotid bruit, no JVD, supple, symmetrical, trachea midline and thyroid not enlarged, symmetric, no tenderness/mass/nodules Lungs: clear to auscultation bilaterally Heart: regular rate and rhythm, S1, S2 normal, no murmur, click, rub or gallop Extremities: extremities normal, atraumatic, no cyanosis or edema  Lab Results: Results for orders placed or performed during the hospital encounter of 02/22/14 (from the past 48 hour(s))  Glucose, capillary     Status: Abnormal   Collection Time: 02/24/14 12:09 PM  Result Value Ref Range   Glucose-Capillary 232 (H) 70 - 99 mg/dL   Comment 1 Notify RN   Glucose, capillary     Status: Abnormal   Collection Time: 02/24/14  5:13 PM  Result Value Ref Range   Glucose-Capillary 129 (H) 70 - 99 mg/dL  Glucose, capillary     Status: Abnormal   Collection Time: 02/24/14  9:34 PM  Result Value Ref Range   Glucose-Capillary 162 (H) 70 - 99 mg/dL  Glucose, capillary     Status: Abnormal   Collection Time: 02/25/14  6:29 AM  Result Value Ref Range   Glucose-Capillary 125 (H) 70 - 99 mg/dL  Glucose, capillary     Status: Abnormal   Collection Time: 02/25/14 11:10 AM  Result Value Ref Range   Glucose-Capillary 122 (H) 70 - 99 mg/dL   Comment 1 Notify RN   Basic metabolic panel     Status: Abnormal   Collection Time: 02/25/14 12:34 PM    Result Value Ref Range   Sodium 140 135 - 145 mmol/L   Potassium 3.5 3.5 - 5.1 mmol/L   Chloride 103 96 - 112 mmol/L   CO2 29 19 - 32 mmol/L   Glucose, Bld 119 (H) 70 - 99 mg/dL   BUN 10 6 - 23 mg/dL   Creatinine, Ser 1.00 0.50 - 1.10 mg/dL   Calcium 8.7 8.4 - 10.5 mg/dL   GFR calc non Af Amer 59 (L) >90 mL/min   GFR calc Af Amer 68 (L) >90 mL/min    Comment: (NOTE) The eGFR has been calculated using the CKD EPI equation. This calculation has not been validated in all clinical situations. eGFR's persistently <90 mL/min signify possible Chronic Kidney Disease.    Anion gap 8 5 - 15  Glucose, capillary     Status: Abnormal   Collection Time: 02/25/14  4:26 PM  Result Value Ref Range   Glucose-Capillary 214 (H) 70 - 99 mg/dL   Comment 1 Notify RN   Glucose, capillary     Status: Abnormal   Collection Time: 02/25/14  9:11 PM  Result Value Ref Range   Glucose-Capillary 285 (H) 70 - 99 mg/dL   Comment 1 Documented in Chart    Comment 2 Notify RN   Procalcitonin     Status: None   Collection Time: 02/26/14  4:15  AM  Result Value Ref Range   Procalcitonin <0.10 ng/mL    Comment:        Interpretation: PCT (Procalcitonin) <= 0.5 ng/mL: Systemic infection (sepsis) is not likely. Local bacterial infection is possible. (NOTE)         ICU PCT Algorithm               Non ICU PCT Algorithm    ----------------------------     ------------------------------         PCT < 0.25 ng/mL                 PCT < 0.1 ng/mL     Stopping of antibiotics            Stopping of antibiotics       strongly encouraged.               strongly encouraged.    ----------------------------     ------------------------------       PCT level decrease by               PCT < 0.25 ng/mL       >= 80% from peak PCT       OR PCT 0.25 - 0.5 ng/mL          Stopping of antibiotics                                             encouraged.     Stopping of antibiotics           encouraged.     ----------------------------     ------------------------------       PCT level decrease by              PCT >= 0.25 ng/mL       < 80% from peak PCT        AND PCT >= 0.5 ng/mL            Continuin g antibiotics                                              encouraged.       Continuing antibiotics            encouraged.    ----------------------------     ------------------------------     PCT level increase compared          PCT > 0.5 ng/mL         with peak PCT AND          PCT >= 0.5 ng/mL             Escalation of antibiotics                                          strongly encouraged.      Escalation of antibiotics        strongly encouraged.   Basic metabolic panel     Status: Abnormal   Collection Time: 02/26/14  4:15 AM  Result Value Ref Range   Sodium 139 135 - 145 mmol/L   Potassium 3.9 3.5 - 5.1 mmol/L  Chloride 101 96 - 112 mmol/L   CO2 30 19 - 32 mmol/L   Glucose, Bld 175 (H) 70 - 99 mg/dL   BUN 13 6 - 23 mg/dL   Creatinine, Ser 1.03 0.50 - 1.10 mg/dL   Calcium 8.8 8.4 - 10.5 mg/dL   GFR calc non Af Amer 57 (L) >90 mL/min   GFR calc Af Amer 66 (L) >90 mL/min    Comment: (NOTE) The eGFR has been calculated using the CKD EPI equation. This calculation has not been validated in all clinical situations. eGFR's persistently <90 mL/min signify possible Chronic Kidney Disease.    Anion gap 8 5 - 15  Protime-INR     Status: None   Collection Time: 02/26/14  4:15 AM  Result Value Ref Range   Prothrombin Time 13.3 11.6 - 15.2 seconds   INR 1.00 0.00 - 1.49  Glucose, capillary     Status: Abnormal   Collection Time: 02/26/14  5:54 AM  Result Value Ref Range   Glucose-Capillary 149 (H) 70 - 99 mg/dL    Imaging: Imaging results have been reviewed  Assessment/Plan:   1. Principal Problem: 2.   Acute dyspnea 3. Active Problems: 4.   Diabetes mellitus type 2 with complications, uncontrolled 5.   Essential hypertension 6.   Bilateral pleural effusion 7.   CAP  (community acquired pneumonia) 87.   Dyspnea 9.   Hyperglycemia 10.   Acute systolic heart failure 11.   CAD in native artery 12.   Time Spent Directly with Patient:  15 minutes  Length of Stay:  LOS: 4 days   Results of cath noted. Severe 3VD. TCTS consult obtained. Surgical disease. This most likely explains LV dysfunction (ISCM). Good diuresis.  Lorretta Harp 02/26/2014, 11:17 AM

## 2014-02-26 NOTE — Interval H&P Note (Signed)
Cath Lab Visit (complete for each Cath Lab visit)  Clinical Evaluation Leading to the Procedure:   ACS: No.  Non-ACS:    Anginal Classification: CCS IV  Anti-ischemic medical therapy: Maximal Therapy (2 or more classes of medications)  Non-Invasive Test Results: No non-invasive testing performed  Prior CABG: No previous CABG      History and Physical Interval Note:  02/26/2014 8:56 AM  [name]  has presented today for surgery, with the diagnosis of cardiomyopathy  The various methods of treatment have been discussed with the patient and family. After consideration of risks, benefits and other options for treatment, the patient has consented to  Procedure(s): LEFT AND RIGHT HEART CATHETERIZATION WITH CORONARY ANGIOGRAM (N/A) as a surgical intervention .  The patient's history has been reviewed, patient examined, no change in status, stable for surgery.  I have reviewed the patient's chart and labs.  Questions were answered to the patient's satisfaction.     Sinclair Grooms

## 2014-02-26 NOTE — Consult Note (Signed)
[address]       [place] [address]             [phone]          [name] [place] Medical Record [id number] Date of Birth: 1950-05-03  Referring: Quay Burow, MD Primary Care: [facility] (last seen there in 2014)  Chief Complaint:    Chief Complaint  Patient presents with  . Shortness of Breath    History of Present Illness:     The patient is a 64 year old female with a history of hypertension, diabetes, hyperlipidemia, but self discontinued all her medications over a year ago.  She has not been seen by her primary MD since 2014.  She has been in her usual state of health until the past 3 weeks, when she began to develop dyspnea, which has significantly worsened over the past week.  Her symptoms initially occurred with activity, and she has had to decrease her activity and slow her walking to compensate.  She also reports orthopnea and PND, but denies any history of chest pain.  She has a history of chronic lower extremity edema, but reports worsening over the past 2-3 months. She has had subjective fevers and nonproductive cough as well. 3 days ago, she was in the shower when she became significantly dyspneic to the point that she felt she was going to smother. She called out to her daughter, with whom she lives, and her daughter brought her to the ER at [facility] for further evaluation.   In the ER, she was given a fluid bolus and shortly thereafter developed respiratory distress.  She underwent a CTA which was negative for pulmonary embolus, but showed multifocal patchy infiltrates concerning for CAP, as well as large bilateral effusions concerning for new acute systolic CHF. EKG was unremarkable. She was admitted and underwent a 2D echocardiogram, which revealed EF 25-30%, a small pericardial effusion and a left pleural effusion, with no valvular abnormalities.  Cardiology was consulted and she was aggressively  diuresed.  Cardiac catheterization was recommended, and this was performed on 02/26/2014.  This revealed severe 3 vessel coronary artery disease with EF 30-35%.  There was total occlusion of the proximal to mid circumflex, total occlusion of the mid RCA, high grade proximal second diagonal disease, and high grade multifocal obstruction to the proximal and mid LAD, which supplied collaterals to the circumflex and RCA.  TCTS has been consulted for consideration of surgical revascularization.    Current Activity/ Functional Status: Patient is independent with mobility/ambulation, transfers, ADL's, IADL's.    Zubrod Score: At the time of surgery this patient's most appropriate activity status/level should be described as: []     0    Normal activity, no symptoms [x]     1    Restricted in physical strenuous activity but ambulatory, able to do out light work []     2    Ambulatory and capable of self care, unable to do work activities, up and about                 more than 50%  Of the time                            []     3    Only limited self care, in bed greater than 50% of waking hours []     4  Completely disabled, no self care, confined to bed or chair []     5    Moribund   Past Medical History: Past Medical History  Diagnosis Date  . Diabetes mellitus, uncontrolled (A1C=13.1) 1998    Dx in 1998. Microalbuminuria, never on insulin.  Marland Kitchen GERD (gastroesophageal reflux disease)   . Hyperlipidemia   . Hypertension   . History of blurry vision 06/09    Hospitalized for this  . Personal history of colonic adenomas 10/07/2007  . Pneumonia 01/2014   Medication noncompliance (self discontinued all meds over 1 year ago)    Past Surgical History: Past Surgical History  Procedure Laterality Date  . Total abdominal hysterectomy w/ bilateral salpingoophorectomy  1996   Laser eye surgery last fall   Social History:  History   Social History  . Marital Status: Widowed    Spouse Name: N/A      Number of Children: N/A  . Years of Education: N/A  Lives with her daughter  Social History Main Topics  . Smoking status: Never Smoker   . Smokeless tobacco: Never Used  . Alcohol Use: No  . Drug Use: No  . Sexual Activity: Not on file   Other Topics Concern  . Not on file   Social History Narrative   Married x 42 yrs.  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   Never smoked. Doesn't drink or use drugs.   Does Patient Exercise:  yes - sometimes      To get diabetes supplies for [name] RD, CDE, April 20,2011     Family History: Family History  Problem Relation Age of Onset  . Diabetes Mother     Mother died of MI at age 75  . Heart failure Mother   . ESRD requiring HD Mother   . Heart failure, diabetes Sister     Died  . Heart failure Brother     Died  . Diabetes Brother     Died     Allergies: No Known Allergies    Medications: Current Facility-Administered Medications  Medication Dose Route Frequency Provider Last Rate Last Dose  . 0.9 %  sodium chloride infusion   Intravenous Continuous Belva Crome III, MD 100 mL/hr at 02/26/14 1015    . acetaminophen (TYLENOL) tablet 650 mg  650 mg Oral Q4H PRN Belva Crome III, MD   650 mg at 02/26/14 1014  . aspirin chewable tablet 81 mg  81 mg Oral Daily Lelon Perla, MD   81 mg at 02/26/14 1106  . atorvastatin (LIPITOR) tablet 80 mg  80 mg Oral q1800 Lelon Perla, MD   80 mg at 02/25/14 1800  . carvedilol (COREG) tablet 3.125 mg  3.125 mg Oral BID WC Lorretta Harp, MD   3.125 mg at 02/26/14 1105  . [START ON 02/27/2014] enoxaparin (LOVENOX) injection 40 mg  40 mg Subcutaneous Q24H Belva Crome III, MD      . HYDROcodone-acetaminophen (NORCO/VICODIN) 5-325 MG per tablet 1-2 tablet  1-2 tablet Oral Q4H PRN Clinton Gallant, MD      . insulin aspart (novoLOG) injection 0-9 Units  0-9 Units Subcutaneous TID WC Clinton Gallant, MD   1 Units at 02/26/14  [phone]  . insulin glargine (LANTUS) injection 8 Units  8 Units Subcutaneous QHS Julious Oka, MD   8 Units at 02/25/14 2129  . levofloxacin (LEVAQUIN) tablet 500 mg  500 mg Oral Daily Jake Church  Masters, RPH   500 mg at 02/26/14 1105  . lisinopril (PRINIVIL,ZESTRIL) tablet 2.5 mg  2.5 mg Oral Daily Lelon Perla, MD   2.5 mg at 02/26/14 1105  . ondansetron (ZOFRAN) injection 4 mg  4 mg Intravenous Q6H PRN Belva Crome III, MD      . pantoprazole (PROTONIX) EC tablet 20 mg  20 mg Oral Daily Julious Oka, MD   20 mg at 02/26/14 1105    Prescriptions prior to admission  Medication Sig Dispense Refill Last Dose  . glucose blood (ONE TOUCH ULTRA TEST) test strip Check twice a day before giving insulin dx code 250.00 100 each 5   . glucose blood (RELION ULTIMA TEST) test strip Test your sugar once daily before your meal x 4 days, then test 4 times daily before meals x 4 days 100 each 12 Not Taking  . insulin NPH-regular (NOVOLIN 70/30) (70-30) 100 UNIT/ML injection Please inject 22units into skin 30 minutes before breakfast Then inject 20 units into skin 30 minutes before dinner (Patient not taking: Reported on 02/22/2014) 100 mL 12   . Insulin Syringe-Needle U-100 31G X 15/64" 0.3 ML MISC Use to inject insulin twice daily as directed dx code 250.00 100 each 5   . lisinopril (PRINIVIL,ZESTRIL) 20 MG tablet Take 1 tablet (20 mg total) by mouth daily. (Patient not taking: Reported on 02/22/2014) 30 tablet 5   . metFORMIN (GLUCOPHAGE) 1000 MG tablet Take 1 tablet (1,000 mg total) by mouth 2 (two) times daily with a meal. (Patient not taking: Reported on 02/22/2014) 60 tablet 3   . ONETOUCH DELICA LANCETS FINE MISC Check twice a day before giving insulin dx code 250.00 100 each 12   . simvastatin (ZOCOR) 20 MG tablet Take 1 tablet (20 mg total) by mouth at bedtime. (Patient not taking: Reported on 02/22/2014) 30 tablet 4        Review of Systems:     Cardiac Review of Systems:   Chest Pain [    ]   Resting SOB [ x  ] Exertional SOB  [ x ]  Orthopnea [ x ]   Pedal Edema [ x  ]   Palpitations [  ]  Syncope  [  ]   Presyncope [   ]  General Review of Systems:  Constitional: recent weight change [ n ]; anorexia [  ]; fatigue [  ]; nausea [  ]; night sweats [  ]; fever [ x- subjective ]; or chills [  ];                                                                   Dental: poor dentition[ x- many missing teeth ];   Eye : blurred vision [  ]; diplopia [   ]; vision changes [  ];  Amaurosis fugax[  ]; Resp: cough [ x ];  wheezing[  ];  hemoptysis[  ]; shortness of breath[ x ]; paroxysmal nocturnal dyspnea[ x ]; dyspnea on exertion[ x ]; or orthopnea[ x ];  GI:  gallstones[  ], vomiting[  ];  dysphagia[  ]; melena[  ];  hematochezia [  ]; heartburn[  ];   Hx of  Colonoscopy[ x- h/o polyps ]; GU: kidney stones [  ];  hematuria[  ];   dysuria [  ];  nocturia[  ];  history of     obstruction [  ];             Skin: rash, swelling[  ];, hair loss[  ];  peripheral edema[ x ];  or itching[  ]; Musculosketetal: myalgias[  ];  joint swelling[  ];  joint erythema[  ];  joint pain[  ];  back pain[  ];  Heme/Lymph: bruising[  ];  bleeding[  ];  anemia[  ];  Neuro: TIA[  ];  headaches[  ];  stroke[  ];  vertigo[  ];  seizures[n  ];   paresthesias[  ];  difficulty walking[n  ];  Psych:depression[  ]; anxiety[  ];  Endocrine: diabetes[y  ];  thyroid dysfunction[ n ];             Other:    Physical Exam: BP 114/70 mmHg  Pulse 91  Temp(Src) 98.4 F (36.9 C) (Oral)  Resp 18  Ht 5\' 2"  (1.575 m)  Wt 183 lb 6.8 oz (83.2 kg)  BMI 33.54 kg/m2  SpO2 99%  General appearance: alert, cooperative and no distress Neurologic: intact Neck: No adenopathy or masses, no carotid bruits Heart: regular rate and rhythm, S1, S2 normal, no murmur, click, rub or gallop Lungs: Few crackles in bases Abdomen: soft, non-tender; bowel sounds normal; no masses,  no organomegaly Extremities: + bilateral LE edema.  Left groin  cath site without hematoma, dressing in place.   Vascular: Palpable femoral and pedal pulses   Diagnostic Studies & Laboratory data:    Recent Radiology Findings:    Chest x-ray (02/22/2014): CLINICAL DATA: Dyspnea. Shortness of breath and dry cough for 5 days. Nonsmoker.  EXAM: CHEST 2 VIEW  COMPARISON: None.  FINDINGS: Shallow inspiration with infiltration or atelectasis in both lung bases. Blunting of the costophrenic angle suggesting small pleural effusions. Normal heart size and pulmonary vascularity. No pneumothorax. Degenerative changes in the spine and shoulders.  IMPRESSION: Infiltration or atelectasis in the lung bases with small pleural effusions.   Electronically Signed  By: Lucienne Capers M.D.  On: 02/22/2014 03:49   CT Chest (02/23/2104): CLINICAL DATA: Shortness breath for 5 days. Cough. Dyspnea. Increased shortness of breath when lying flat.  EXAM: CT ANGIOGRAPHY CHEST WITH CONTRAST  TECHNIQUE: Multidetector CT imaging of the chest was performed using the standard protocol during bolus administration of intravenous contrast. Multiplanar CT image reconstructions and MIPs were obtained to evaluate the vascular anatomy.  CONTRAST: 171mL OMNIPAQUE IOHEXOL 350 MG/ML SOLN  COMPARISON: Two-view chest x-ray 02/22/2014.  FINDINGS: Pulmonary arterial opacification is excellent. There is no focal filling defect to suggest pulmonary embolus.  The heart is mildly enlarged. Coronary artery calcifications are evident.  Moderate sized bilateral pleural effusions are present. Multi focal airspace disease involves the lingula and lower lobes bilaterally. There is some scattered ground-glass attenuation in the upper lobes. There is a dependent atelectasis are noted as well.  No significant mediastinal or axillary adenopathy is present.  Mild endplate degenerative changes in some facet disease are noted throughout the thoracic spine.  No focal lytic or blastic lesions are present.  Review of the MIP images confirms the above findings.  IMPRESSION: 1. Multi focal airspace disease compatible with pneumonia. 2. No pulmonary embolus. 3. Mild cardiomegaly. 4. Moderate bilateral pleural effusions. 5. Additional ground-glass attenuation suggests some element of edema as well. There is likely component of congestive heart failure associated.   Electronically Signed  By: Lawrence Santiago M.D.  On: 02/22/2014 07:19  I have independently reviewed the above radiology studies  and reviewed the findings with the patient.    Recent Lab Findings: Lab Results  Component Value Date   WBC 4.7 02/24/2014   HGB 10.4* 02/24/2014   HCT 32.1* 02/24/2014   PLT 194 02/24/2014   GLUCOSE 175* 02/26/2014   CHOL 255* 02/22/2014   TRIG 94 02/22/2014   HDL 56 02/22/2014   LDLCALC 180* 02/22/2014   ALT 42* 02/23/2014   AST 33 02/23/2014   NA 139 02/26/2014   K 3.9 02/26/2014   CL 101 02/26/2014   CREATININE 1.03 02/26/2014   BUN 13 02/26/2014   CO2 30 02/26/2014   TSH 1.870 02/22/2014   INR 1.00 02/26/2014   HGBA1C 13.1* 02/22/2014     Echocardiogram (02/22/2014): Transthoracic Echocardiography  Patient:  [name], [name] MR #:    [id number] Study Date: 02/22/2014 Gender:   F Age:    5 Height:   157.5 cm Weight:   87 kg BSA:    1.99 m^2 Pt. Status: Room:    3E11C  ADMITTING  Joines, Guerry Bruin ATTENDING  Joines, Guerry Bruin ORDERING   Joines, Guerry Bruin REFERRING  Joines, Guerry Bruin SONOGRAPHER Leavy Cella PERFORMING  [facility], Inpatient  cc:  ------------------------------------------------------------------- LV EF: 25% -  30%  ------------------------------------------------------------------- History:  PMH: Bilateral Pleural Effusion. Dyspnea. Risk factors: Hypertension. Diabetes mellitus.  Dyslipidemia.  ------------------------------------------------------------------- Study Conclusions  - Left ventricle: The cavity size was normal. Wall thickness was increased in a pattern of mild LVH. Systolic function was severely reduced. The estimated ejection fraction was in the range of 25% to 30%. Diffuse hypokinesis. - Mitral valve: There was mild to moderate regurgitation. - Left atrium: The atrium was mildly dilated. - Right ventricle: The cavity size was mildly dilated. Systolic function was moderately reduced. - Right atrium: The atrium was mildly dilated. - Tricuspid valve: There was mild-moderate regurgitation. - Pericardium, extracardiac: A small pericardial effusion was identified posterior to the heart. There was a left pleural effusion.  Transthoracic echocardiography. M-mode, complete 2D, spectral Doppler, and color Doppler. Birthdate: Patient birthdate: Jun 21, 1950. Age: Patient is 64 yr old. Sex: Gender: female. BMI: 35.1 kg/m^2. Blood pressure:   144/90 Patient status: Inpatient. Study date: Study date: 02/22/2014. Study time: 03:09 PM. Location: Bedside.  -------------------------------------------------------------------  Cardiac Catheterization:  Left and Right Heart Catheterization with Coronary Angiography  Report  [name]  64 y.o.  female 11/29/1950  Procedure Date: 02/26/2014 Referring Physician: Quay Burow, M.D. Primary Cardiologist: Quay Burow, M.D.  INDICATIONS: Acute systolic heart failure of uncertain etiology in a patient with multiple risk factors for coronary artery disease.  PROCEDURE: 1. Left heart catheterization; 2. Right heart catheterization; 3. Coronary angiography; 4. Left ventriculography  CONSENT:  The risks, benefits, and details of the procedure were explained in detail to the patient. Risks including death, stroke, heart attack, kidney injury, allergy, limb ischemia, bleeding and  radiation injury were discussed.  The patient verbalized understanding and wanted to proceed.  Informed written consent was obtained.  PROCEDURE TECHNIQUE:  After Xylocaine anesthesia a 5 French arterial and 6 French venous sheaths were placed in the right femoral  Artery and vein respectively using the modified Seldinger technique.  Right heart catheterization was performed with a 6 French Swan-Ganz catheter. A main pulmonary artery O2 saturation was obtained. Coronary angiography was done using a 5 F A2 MP diagnostic catheter.  Left ventriculography was done using the A2 MP  catheter and hand injection.   Images were reviewed.  Arterial hemostasis was achieved with a Vascade closure device. The venous site was controlled with manual compression.    CONTRAST:  Total of 85 cc.  COMPLICATIONS:  None   HEMODYNAMICS:  Aortic pressure 108/59 mmHg; LV pressure 108/6 mmHg; LVEDP 21 mmHg; RA 4 mmHg; RV 32/7 mmHg; PA 36/13 mmHg; PCWP(mean) 14 mmHg; Cardiac Output 4.83 L/m (Fick) and 3.83 L/m (thermodilution); AV gradient none  ANGIOGRAPHIC DATA:   Cinefluoroscopy demonstrated heavy three-vessel calcification   The left main coronary artery is short and widely patent. It branches into the LAD and circumflex.   The left anterior descending artery is heavily calcified. There is diffuse disease starting at the ostium. In the distal portion of the proximal vessel there is a segmental 80-90% obstruction. This is followed by an 80-90% mid obstruction. The LAD is very large and wraps around the left ventricular apex. 2 early rising diagonal branches are diffusely diseased. The ostium of the second diagonal is 80% obstructed. The LAD supplies collaterals to the distal right coronary, in particular a moderate sized acute marginal branch.   The left circumflex artery is Stone occluded after a small first obtuse marginal branch. 2 distal obtuse marginals can be seen to fill by left to left collaterals. These branches  appear relatively small. It appears that the diagonal branches noted above supply much of the lateral wall. Whether the circumflex is graftable is a question that causes concern.   The right coronary artery is totally occluded in the mid vessel. The small PDA and a left ventricular branch are filled by left-to-right collaterals from the LAD.  LEFT VENTRICULOGRAM:  Left ventricular angiogram was done in the 30 RAO projection and revealed a dilated left ventricular cavity with global hypokinesis and an estimated ejection fraction of 30-35%.   IMPRESSIONS:  1. Severe three-vessel coronary artery disease with total occlusion of the proximal to mid circumflex, total occlusion of the mid RCA, and high grade multifocal obstruction in the proximal and mid LAD. The LAD supplies collaterals to the circumflex and right coronary territories. The second diagonal contains high-grade proximal disease. 2. Ischemic cardiomyopathy with ejection fraction of 30-35%.    RECOMMENDATION:  Revascularization with coronary bypass surgery would seem appropriate. I have left a message on voice mail at [place] with Levonne Spiller, to obtain a surgical consultation for consideration of CABG                   I have independently reviewed the above  cath films and reviewed the findings with the  patient .     Assessment / Plan:   The patient is a 64 year old female with cardiac risk factors including uncontrolled diabetes (A1C=13.1), hypertension, hyperlipidemia, and medical noncompliance who presented with acute systolic CHF.  Cardiac catheterization revealed severe 3 vessel CAD with ischemic cardiomyopathy (EF 30-35%).  I have  Reviewed with the patient and her daughter the finds on cath and echo. With depressed lv function and 3 v cad with DM cabg offers best option for treatment. I have discussed options with the patient she is agreeable with proceeding with CABG. Will Plan for FEB 1 Monday.  The goals risks and  alternatives of the planned surgical procedure CABG  have been discussed with the patient in detail. The risks of the procedure including death, infection, stroke, myocardial infarction, bleeding, blood transfusion have all been discussed specifically.  I have quoted Renea Ee a 5 %  of perioperative mortality and a complication rate as high as 35 %. The patient's questions have been answered.[name] is willing  to proceed with the planned procedure.   Grace Isaac MD      [address] [place] [address] Office [phone]   Beeper [phone]  02/26/2014 3:05 PM

## 2014-02-27 DIAGNOSIS — I255 Ischemic cardiomyopathy: Secondary | ICD-10-CM

## 2014-02-27 DIAGNOSIS — R1110 Vomiting, unspecified: Secondary | ICD-10-CM

## 2014-02-27 DIAGNOSIS — Z01810 Encounter for preprocedural cardiovascular examination: Secondary | ICD-10-CM

## 2014-02-27 LAB — GLUCOSE, CAPILLARY
GLUCOSE-CAPILLARY: 235 mg/dL — AB (ref 70–99)
Glucose-Capillary: 133 mg/dL — ABNORMAL HIGH (ref 70–99)
Glucose-Capillary: 182 mg/dL — ABNORMAL HIGH (ref 70–99)
Glucose-Capillary: 333 mg/dL — ABNORMAL HIGH (ref 70–99)

## 2014-02-27 LAB — BASIC METABOLIC PANEL
ANION GAP: 3 — AB (ref 5–15)
BUN: 9 mg/dL (ref 6–23)
CALCIUM: 8.7 mg/dL (ref 8.4–10.5)
CHLORIDE: 101 mmol/L (ref 96–112)
CO2: 30 mmol/L (ref 19–32)
Creatinine, Ser: 0.97 mg/dL (ref 0.50–1.10)
GFR calc Af Amer: 71 mL/min — ABNORMAL LOW (ref >90)
GFR, EST NON AFRICAN AMERICAN: 61 mL/min — AB (ref >90)
GLUCOSE: 148 mg/dL — AB (ref 70–99)
POTASSIUM: 3.5 mmol/L (ref 3.5–5.1)
Sodium: 134 mmol/L — ABNORMAL LOW (ref 135–145)

## 2014-02-27 MED ORDER — INSULIN GLARGINE 100 UNIT/ML ~~LOC~~ SOLN
10.0000 [IU] | Freq: Every day | SUBCUTANEOUS | Status: DC
  Administered 2014-02-28: 10 [IU] via SUBCUTANEOUS
  Filled 2014-02-27 (×2): qty 0.1

## 2014-02-27 MED ORDER — ONDANSETRON HCL 4 MG/2ML IJ SOLN
4.0000 mg | Freq: Three times a day (TID) | INTRAMUSCULAR | Status: DC
  Administered 2014-02-27 – 2014-03-01 (×5): 4 mg via INTRAVENOUS
  Filled 2014-02-27 (×3): qty 2

## 2014-02-27 MED ORDER — LISINOPRIL 5 MG PO TABS
5.0000 mg | ORAL_TABLET | Freq: Every day | ORAL | Status: DC
  Administered 2014-02-28: 5 mg via ORAL
  Filled 2014-02-27 (×2): qty 1

## 2014-02-27 MED ORDER — ONDANSETRON HCL 4 MG/2ML IJ SOLN
4.0000 mg | Freq: Four times a day (QID) | INTRAMUSCULAR | Status: DC | PRN
  Administered 2014-02-27: 4 mg via INTRAVENOUS

## 2014-02-27 MED ORDER — ONDANSETRON HCL 4 MG/2ML IJ SOLN
4.0000 mg | Freq: Four times a day (QID) | INTRAMUSCULAR | Status: DC | PRN
  Filled 2014-02-27 (×2): qty 2

## 2014-02-27 NOTE — Progress Notes (Signed)
Patient Name: [name] Date of Encounter: 02/27/2014  Principal Problem:   Acute dyspnea Active Problems:   Diabetes mellitus type 2 with complications, uncontrolled   Essential hypertension   Bilateral pleural effusion   CAP (community acquired pneumonia)   Dyspnea   Hyperglycemia   Acute systolic heart failure   CAD in native artery   Length of Stay: 5  SUBJECTIVE  No dyspnea or angina. Has consistent postprandial nausea when eating solid food. Soups OK.  CURRENT MEDS . aspirin  81 mg Oral Daily  . atorvastatin  80 mg Oral q1800  . carvedilol  3.125 mg Oral BID WC  . enoxaparin (LOVENOX) injection  40 mg Subcutaneous Q24H  . insulin aspart  0-9 Units Subcutaneous TID WC  . insulin glargine  8 Units Subcutaneous QHS  . levofloxacin  500 mg Oral Daily  . lisinopril  2.5 mg Oral Daily  . pantoprazole  20 mg Oral Daily    OBJECTIVE   Intake/Output Summary (Last 24 hours) at 02/27/14 0946 Last data filed at 02/27/14 0622  Gross per 24 hour  Intake    240 ml  Output    250 ml  Net    -10 ml   Filed Weights   02/25/14 [id number] 02/26/14 0557 02/27/14 0617  Weight: 186 lb 1.1 oz (84.4 kg) 183 lb 6.8 oz (83.2 kg) 184 lb 4.8 oz (83.598 kg)    PHYSICAL EXAM Filed Vitals:   02/26/14 1009 02/26/14 1438 02/26/14 2115 02/27/14 0617  BP: 114/70 125/69 107/59 129/69  Pulse: 91 99 96 84  Temp:  98.2 F (36.8 C) 98.7 F (37.1 C) 98.6 F (37 C)  TempSrc:  Oral Oral Oral  Resp:  20 18 18   Height:      Weight:    184 lb 4.8 oz (83.598 kg)  SpO2:  97% 97% 95%   General: Alert, oriented x3, no distress Head: no evidence of trauma, PERRL, EOMI, no exophtalmos or lid lag, no myxedema, no xanthelasma; normal ears, nose and oropharynx Neck: normal jugular venous pulsations and no hepatojugular reflux; brisk carotid pulses without delay and no carotid bruits Chest: clear to auscultation, no signs of consolidation by percussion or palpation, normal fremitus, symmetrical  and full respiratory excursions Cardiovascular: normal position and quality of the apical impulse, regular rhythm, normal first and second heart sounds, no rubs or gallops, no murmur Abdomen: no tenderness or distention, no masses by palpation, no abnormal pulsatility or arterial bruits, normal bowel sounds, no hepatosplenomegaly Extremities: no clubbing, cyanosis or edema; 2+ radial, ulnar and brachial pulses bilaterally; 2+ right femoral, posterior tibial and dorsalis pedis pulses; 2+ left femoral, posterior tibial and dorsalis pedis pulses; no subclavian or femoral bruits Neurological: grossly nonfocal  LABS  CBC  Recent Labs  02/26/14 1045  WBC 4.3  HGB 10.8*  HCT 32.5*  MCV 81.0  PLT [id number]   Basic Metabolic Panel  Recent Labs  02/26/14 0415 02/26/14 1045 02/27/14 0545  NA 139  --  134*  K 3.9  --  3.5  CL 101  --  101  CO2 30  --  30  GLUCOSE 175*  --  148*  BUN 13  --  9  CREATININE 1.03 0.88 0.97  CALCIUM 8.8  --  8.7    Radiology Studies Imaging results have been reviewed and No results found.  TELE NSR  ECG NSR, NSSTT, borderline QT  ASSESSMENT AND PLAN   Severe multivessel CAD with severe ischemic cardiomyopathy and  recent onset systolic heart failure. Appears to be at/close to euvolemia. (negative >4L since admission). Probably premature to up-titrate recently initiated beta blocker. Will increase ACEi. Normal renal function. Plan for CABG on Monday. Postprandial nausea could be due to gastroparesis.   Sanda Klein, MD, [facility] [phone] office [phone] pager 02/27/2014 9:46 AM

## 2014-02-27 NOTE — Progress Notes (Signed)
Pt vomits after eating breakfast.  "nauseated".  Daughter states,"she has thrown up every time she has put solid food in her mouth"  Dr. Sallyanne Kuster in room rounding and gives order for Zofran.  Notified Dr. Aurelio Brash of pt being unable to tolerate solid food since admission whom states, "will be there to assess her soon".

## 2014-02-27 NOTE — Progress Notes (Signed)
Subjective: Pt reports post prandial vomiting since admission. States she has about 2 episodes of emesis per day. Denies nausea, states it just comes up after eating. On exam this morning pt denies nausea but then upon leaving the room pt feeling like she is about to vomit again. Yellow grit like emesis visualized in trash can.    Objective: Vital signs in last 24 hours: Filed Vitals:   02/26/14 1009 02/26/14 1438 02/26/14 2115 02/27/14 0617  BP: 114/70 125/69 107/59 129/69  Pulse: 91 99 96 84  Temp:  98.2 F (36.8 C) 98.7 F (37.1 C) 98.6 F (37 C)  TempSrc:  Oral Oral Oral  Resp:  _0 Height:      Weight:    184 lb 4.8 oz (83.598 kg)  SpO2:  97% 97% 95%   Weight change: 14 oz (0.398 kg)  Intake/Output Summary (Last 24 hours) at 02/27/14 1116 Last data filed at 02/27/14 0622  Gross per 24 hour  Intake    240 ml  Output    250 ml  Net    -10 ml   General: NAD, sitting in chair in front of seat bathing, denies nausea Lungs:  CTAB, no wheezing Cardiac: RRR, no murmurs, non tender to palpation of sternum and epigastric region GI: soft, active bowel sounds, nontender to palpation Neuro: CN II-XII grossly intact  Lab Results: Basic Metabolic Panel:  Recent Labs Lab 02/26/14 0415 02/26/14 1045 02/27/14 0545  NA 139  --  134*  K 3.9  --  3.5  CL 101  --  101  CO2 30  --  30  GLUCOSE 175*  --  148*  BUN 13  --  9  CREATININE 1.03 0.88 0.97  CALCIUM 8.8  --  8.7   Liver Function Tests:  Recent Labs Lab 02/23/14 0421  AST 33  ALT 42*  ALKPHOS 77  BILITOT 0.6  PROT 6.1  ALBUMIN 2.7*   CBC:  Recent Labs Lab 02/22/14 0319  02/24/14 0405 02/26/14 1045  WBC 7.4  < > 4.7 4.3  NEUTROABS 4.0  --  1.9  --   HGB 11.7*  < > 10.4* 10.8*  HCT 35.8*  < > 32.1* 32.5*  MCV 83.6  < > 82.5 81.0  PLT 232  < > 194 202  < > = values in this interval not displayed. Cardiac Enzymes:  Recent Labs Lab 02/22/14 1013 02/22/14 1550 02/22/14 2218 02/24/14 0405    CKTOTAL  --   --   --  88  TROPONINI <0.03 0.03 <0.03  --    CBG:  Recent Labs Lab 02/25/14 2111 02/26/14 0554 02/26/14 1138 02/26/14 1608 02/26/14 2110 02/27/14 0607  GLUCAP 285* 149* 134* 238* 273* 133*   Hemoglobin A1C:  Recent Labs Lab 02/22/14 1013  HGBA1C 13.1*   Fasting Lipid Panel:  Recent Labs Lab 02/22/14 1046  CHOL 255*  HDL 56  LDLCALC 180*  TRIG 94  CHOLHDL 4.6   Thyroid Function Tests:  Recent Labs Lab 02/22/14 1030  TSH 1.870   Studies/Results: No results found. Medications: I have reviewed the patient's current medications. Scheduled Meds: . aspirin  81 mg Oral Daily  . atorvastatin  80 mg Oral q1800  . carvedilol  3.125 mg Oral BID WC  . enoxaparin (LOVENOX) injection  40 mg Subcutaneous Q24H  . insulin aspart  0-9 Units Subcutaneous TID WC  . insulin glargine  8 Units Subcutaneous QHS  . levofloxacin  500 mg  Oral Daily  . [START ON 02/28/2014] lisinopril  5 mg Oral Daily  . ondansetron (ZOFRAN) IV  4 mg Intravenous 3 times per day  . pantoprazole  20 mg Oral Daily   Continuous Infusions:   PRN Meds:.acetaminophen, HYDROcodone-acetaminophen, ondansetron (ZOFRAN) IV Assessment/Plan: Principal Problem:   Acute dyspnea Active Problems:   Diabetes mellitus type 2 with complications, uncontrolled   Essential hypertension   Bilateral pleural effusion   CAP (community acquired pneumonia)   Dyspnea   Hyperglycemia   Acute systolic heart failure   CAD in native artery  Acute systolic congestive heart failure:  no longer on diuretics, weight currently 184lbs from 192lbs on admission. Last weight on clinic visit on 12/12/12 was 195lbs. -Coreg at 3.125 mg twice a day -Lisinopril 2.5 mg increased to 12m daily -Consider adding spironolactone at a later point once tolerating beta blocker and ACE inhibitor. -Aspirin 81 mg daily -Strict I & os and daily weights -Appreciate cardiology recommendations  Coronary artery disease-- cath this  admission revealed severe three-vessel coronary artery disease with total occlusion of the proximal to mid circumflex, total occlusion of the mid RCA, and high grade multifocal obstruction in the proximal and mid LAD. The LAD supplies collaterals to the circumflex and right coronary territories. The second diagonal contains high-grade proximal disease. Ischemic cardiomyopathy with ejection fraction of 30-35%.  - CABG procedure Monday  Post prandial emesis-- denies emesis at home, post prandial emesis started on admission. Pt has not told anyone about these episodes until this morning. Likely due to levofloxacin that was started on admission w/ SE of nausea. Pt also has hx of acid reflux not on any GERD meds at home. Denies abdominal pain, abd non TTP on exam, unlikely gall bladder etiology. Alk phos and t bili WNL on admission - clear liquid diet - zofran [id number] prn - pt has completed 5 days of abx tx for CAP, will continue course for 2 more days for adequate coverage.   Community-acquired pneumonia-- infiltrates consistent w/ PNA noted on CTA on admission -Continue PO levofloxacin for 2 more days for total of 7 days of tx  Hyperlipidemia: LDL of 180.  -80 mg atorvastatin  DMVF:[id number] w/ hx of uncontrolled DM - SSI - lantus 8 units qhs  Dispo: Disposition is deferred at this time, awaiting improvement of current medical problems.     The patient does have a current PCP (Ejiroghene E Arlyce DiceMD) and [facility].  The patient does not have transportation limitations that hinder transportation to clinic appointments.  .Services Needed at time of discharge: Y = Yes, Blank = No PT:   OT:   RN:   Equipment:   Other:     LOS: 5 days   DiJulious OkaMD 02/27/2014, 11:16 AM

## 2014-02-27 NOTE — Progress Notes (Addendum)
Pre-op Cardiac Surgery  Carotid Findings:  1-39 ICA stenosis, bilaterally.  Vertebral artery flow is antegrade.   Upper Extremity Right Left  Brachial Pressures 113T 14T  Radial Waveforms T T  Ulnar Waveforms T T  Palmar Arch (Allen's Test) WNL WNL   Findings:  WNL    Lower  Extremity Right Left  Dorsalis Pedis     Anterior Tibial 300M-B 300M-B  Posterior Tibial 123M 152M  Ankle/Brachial Indices >1 >1    Findings:  Unable to ascertain ABI in ATs secondary to calcified vessels.  PT ABIs WNL

## 2014-02-27 NOTE — Progress Notes (Signed)
Pt continues to vomit small amounts and dry heave after dose of zofran. Notified Dr. Hulen Luster.

## 2014-02-27 NOTE — Progress Notes (Signed)
Pre Operative CABG education completed with patient and daughter.  Given booklet.  Stressed IS, deep breathing and pain management.  No questions from patient or daughter. [id number]

## 2014-02-28 DIAGNOSIS — Z9114 Patient's other noncompliance with medication regimen: Secondary | ICD-10-CM

## 2014-02-28 DIAGNOSIS — K219 Gastro-esophageal reflux disease without esophagitis: Secondary | ICD-10-CM

## 2014-02-28 DIAGNOSIS — Z7982 Long term (current) use of aspirin: Secondary | ICD-10-CM

## 2014-02-28 DIAGNOSIS — I2510 Atherosclerotic heart disease of native coronary artery without angina pectoris: Secondary | ICD-10-CM | POA: Insufficient documentation

## 2014-02-28 DIAGNOSIS — I5042 Chronic combined systolic (congestive) and diastolic (congestive) heart failure: Secondary | ICD-10-CM

## 2014-02-28 DIAGNOSIS — Z794 Long term (current) use of insulin: Secondary | ICD-10-CM

## 2014-02-28 LAB — URINALYSIS, ROUTINE W REFLEX MICROSCOPIC
GLUCOSE, UA: NEGATIVE mg/dL
Ketones, ur: NEGATIVE mg/dL
NITRITE: NEGATIVE
Specific Gravity, Urine: 1.026 (ref 1.005–1.030)
UROBILINOGEN UA: 1 mg/dL (ref 0.0–1.0)
pH: 5 (ref 5.0–8.0)

## 2014-02-28 LAB — CBC
HCT: 36.2 % (ref 36.0–46.0)
Hemoglobin: 11.6 g/dL — ABNORMAL LOW (ref 12.0–15.0)
MCH: 26.6 pg (ref 26.0–34.0)
MCHC: 32 g/dL (ref 30.0–36.0)
MCV: 83 fL (ref 78.0–100.0)
Platelets: 211 10*3/uL (ref 150–400)
RBC: 4.36 MIL/uL (ref 3.87–5.11)
RDW: 14.1 % (ref 11.5–15.5)
WBC: 6.7 10*3/uL (ref 4.0–10.5)

## 2014-02-28 LAB — COMPREHENSIVE METABOLIC PANEL
ALT: 19 U/L (ref 0–35)
ALT: 27 U/L (ref 0–35)
ANION GAP: 5 (ref 5–15)
AST: 21 U/L (ref 0–37)
AST: 37 U/L (ref 0–37)
Albumin: 2.4 g/dL — ABNORMAL LOW (ref 3.5–5.2)
Albumin: 3 g/dL — ABNORMAL LOW (ref 3.5–5.2)
Alkaline Phosphatase: 58 U/L (ref 39–117)
Alkaline Phosphatase: 74 U/L (ref 39–117)
Anion gap: 7 (ref 5–15)
BUN: 8 mg/dL (ref 6–23)
BUN: 9 mg/dL (ref 6–23)
CALCIUM: 8.4 mg/dL (ref 8.4–10.5)
CO2: 28 mmol/L (ref 19–32)
CO2: 30 mmol/L (ref 19–32)
Calcium: 8.8 mg/dL (ref 8.4–10.5)
Chloride: 103 mmol/L (ref 96–112)
Chloride: 98 mmol/L (ref 96–112)
Creatinine, Ser: 1 mg/dL (ref 0.50–1.10)
Creatinine, Ser: 1.18 mg/dL — ABNORMAL HIGH (ref 0.50–1.10)
GFR calc Af Amer: 68 mL/min — ABNORMAL LOW (ref >90)
GFR calc Af Amer: 56 mL/min — ABNORMAL LOW (ref >90)
GFR calc non Af Amer: 59 mL/min — ABNORMAL LOW (ref >90)
GFR calc non Af Amer: 48 mL/min — ABNORMAL LOW (ref >90)
GLUCOSE: 186 mg/dL — AB (ref 70–99)
Glucose, Bld: 201 mg/dL — ABNORMAL HIGH (ref 70–99)
POTASSIUM: 3.7 mmol/L (ref 3.5–5.1)
Potassium: 3.6 mmol/L (ref 3.5–5.1)
SODIUM: 136 mmol/L (ref 135–145)
Sodium: 135 mmol/L (ref 135–145)
TOTAL PROTEIN: 5.6 g/dL — AB (ref 6.0–8.3)
Total Bilirubin: 0.5 mg/dL (ref 0.3–1.2)
Total Bilirubin: 0.5 mg/dL (ref 0.3–1.2)
Total Protein: 6.9 g/dL (ref 6.0–8.3)

## 2014-02-28 LAB — URINE MICROSCOPIC-ADD ON

## 2014-02-28 LAB — GLUCOSE, CAPILLARY
Glucose-Capillary: 239 mg/dL — ABNORMAL HIGH (ref 70–99)
Glucose-Capillary: 146 mg/dL — ABNORMAL HIGH (ref 70–99)
Glucose-Capillary: 147 mg/dL — ABNORMAL HIGH (ref 70–99)
Glucose-Capillary: 180 mg/dL — ABNORMAL HIGH (ref 70–99)
Glucose-Capillary: 184 mg/dL — ABNORMAL HIGH (ref 70–99)

## 2014-02-28 LAB — LACTIC ACID, PLASMA: Lactic Acid, Venous: 0.9 mmol/L (ref 0.5–2.0)

## 2014-02-28 LAB — ABO/RH: ABO/RH(D): B POS

## 2014-02-28 LAB — HEPATITIS PANEL, ACUTE
HCV Ab: NEGATIVE
HEP B S AG: NEGATIVE
Hep A IgM: NONREACTIVE
Hep B C IgM: NONREACTIVE

## 2014-02-28 LAB — LIPASE, BLOOD: Lipase: 20 U/L (ref 11–59)

## 2014-02-28 LAB — MAGNESIUM: Magnesium: 1.9 mg/dL (ref 1.5–2.5)

## 2014-02-28 MED ORDER — VANCOMYCIN HCL 10 G IV SOLR
1250.0000 mg | INTRAVENOUS | Status: AC
  Administered 2014-03-01: 1250 mg via INTRAVENOUS
  Filled 2014-02-28: qty 1250

## 2014-02-28 MED ORDER — DEXTROSE 5 % IV SOLN
1.5000 g | INTRAVENOUS | Status: AC
  Administered 2014-03-01: 1.5 g via INTRAVENOUS
  Administered 2014-03-01: .75 g via INTRAVENOUS
  Filled 2014-02-28 (×2): qty 1.5

## 2014-02-28 MED ORDER — DEXTROSE 5 % IV SOLN
750.0000 mg | INTRAVENOUS | Status: DC
  Filled 2014-02-28: qty 750

## 2014-02-28 MED ORDER — METOPROLOL TARTRATE 12.5 MG HALF TABLET
12.5000 mg | ORAL_TABLET | Freq: Once | ORAL | Status: AC
  Administered 2014-03-01: 12.5 mg via ORAL
  Filled 2014-02-28 (×2): qty 1

## 2014-02-28 MED ORDER — CHLORHEXIDINE GLUCONATE CLOTH 2 % EX PADS
6.0000 | MEDICATED_PAD | Freq: Once | CUTANEOUS | Status: AC
  Administered 2014-03-01: 6 via TOPICAL

## 2014-02-28 MED ORDER — DOPAMINE-DEXTROSE 3.2-5 MG/ML-% IV SOLN
0.0000 ug/kg/min | INTRAVENOUS | Status: AC
  Administered 2014-03-01: 3 ug/kg/min via INTRAVENOUS
  Filled 2014-02-28: qty 250

## 2014-02-28 MED ORDER — POTASSIUM CHLORIDE 2 MEQ/ML IV SOLN
80.0000 meq | INTRAVENOUS | Status: DC
  Filled 2014-02-28: qty 40

## 2014-02-28 MED ORDER — NITROGLYCERIN IN D5W 200-5 MCG/ML-% IV SOLN
2.0000 ug/min | INTRAVENOUS | Status: AC
  Administered 2014-03-01: 5 ug/min via INTRAVENOUS
  Filled 2014-02-28: qty 250

## 2014-02-28 MED ORDER — AMINOCAPROIC ACID 250 MG/ML IV SOLN
INTRAVENOUS | Status: AC
  Administered 2014-03-01: 69.8 mL/h via INTRAVENOUS
  Administered 2014-03-01: 13:00:00 via INTRAVENOUS
  Filled 2014-02-28: qty 40

## 2014-02-28 MED ORDER — SODIUM CHLORIDE 0.9 % IV SOLN
INTRAVENOUS | Status: DC
  Filled 2014-02-28: qty 30

## 2014-02-28 MED ORDER — PLASMA-LYTE 148 IV SOLN
INTRAVENOUS | Status: AC
  Administered 2014-03-01: 500 mL
  Filled 2014-02-28: qty 2.5

## 2014-02-28 MED ORDER — BISACODYL 5 MG PO TBEC
5.0000 mg | DELAYED_RELEASE_TABLET | Freq: Once | ORAL | Status: AC
  Administered 2014-02-28: 5 mg via ORAL
  Filled 2014-02-28: qty 1

## 2014-02-28 MED ORDER — DEXTROSE 5 % IV SOLN
0.0000 ug/min | INTRAVENOUS | Status: AC
  Administered 2014-03-01: 2.5 ug/min via INTRAVENOUS
  Filled 2014-02-28: qty 4

## 2014-02-28 MED ORDER — TEMAZEPAM 15 MG PO CAPS: 15.0000 mg | ORAL_CAPSULE | Freq: Once | ORAL | Status: AC | PRN

## 2014-02-28 MED ORDER — PHENYLEPHRINE HCL 10 MG/ML IJ SOLN
30.0000 ug/min | INTRAVENOUS | Status: AC
  Administered 2014-03-01: 25 ug/min via INTRAVENOUS
  Filled 2014-02-28: qty 2

## 2014-02-28 MED ORDER — MAGNESIUM SULFATE 50 % IJ SOLN
40.0000 meq | INTRAMUSCULAR | Status: DC
  Filled 2014-02-28: qty 10

## 2014-02-28 MED ORDER — SODIUM CHLORIDE 0.9 % IV SOLN
INTRAVENOUS | Status: AC
  Administered 2014-03-01: 3.1 [IU]/h via INTRAVENOUS
  Filled 2014-02-28: qty 2.5

## 2014-02-28 MED ORDER — DEXMEDETOMIDINE HCL IN NACL 400 MCG/100ML IV SOLN
0.1000 ug/kg/h | INTRAVENOUS | Status: AC
  Administered 2014-03-01: 0.2 ug/kg/h via INTRAVENOUS
  Filled 2014-02-28: qty 100

## 2014-02-28 MED ORDER — CHLORHEXIDINE GLUCONATE CLOTH 2 % EX PADS
6.0000 | MEDICATED_PAD | Freq: Once | CUTANEOUS | Status: AC
  Administered 2014-02-28: 6 via TOPICAL

## 2014-02-28 NOTE — Progress Notes (Signed)
Subjective:  Pt seen and examined in AM. No acute events overnight. She reports her nausea and vomiting have resolved from yesterday. She denies chest pain, dyspnea, palpitations, lightheadedness, or LE edema. She reports being off her insulin for at least the past year. She denies polydipsia, polyuria, or polyphagia.     Objective: Vital signs in last 24 hours: Filed Vitals:   02/27/14 1429 02/27/14 2000 02/27/14 2051 02/28/14 0527  BP: 114/71  103/53 112/63  Pulse: 109  101 94  Temp: 98 F (36.7 C)  98.4 F (36.9 C) 98.8 F (37.1 C)  TempSrc: Oral  Oral Oral  Resp: 20 18 20 18   Height:      Weight:    181 lb 8 oz (82.328 kg)  SpO2: 95%  96% 96%   Weight change: -2 lb 12.8 oz (-1.27 kg)  Intake/Output Summary (Last 24 hours) at 02/28/14 1031 Last data filed at 02/28/14 0700  Gross per 24 hour  Intake    720 ml  Output    500 ml  Net    220 ml   PHYSICAL EXAMINATION:   General: alert, well-developed, and cooperative to examination.  Lungs: normal respiratory effort, no accessory muscle use, normal breath sounds, no crackles, and no wheezes. Heart: normal rate, regular rhythm Abdomen: soft, non-tender, normal bowel sounds, no distention, no guarding, no rebound tenderness Extremities: No edema Neurologic: alert & oriented X3 Psych: Oriented X3  Lab Results: Basic Metabolic Panel:  Recent Labs Lab 02/27/14 0545 02/28/14 0308  NA 134* 136  K 3.5 3.7  CL 101 103  CO2 30 28  GLUCOSE 148* 186*  BUN 9 8  CREATININE 0.97 1.00  CALCIUM 8.7 8.4  MG  --  1.9   Liver Function Tests:  Recent Labs Lab 02/23/14 0421 02/28/14 0308  AST 33 21  ALT 42* 19  ALKPHOS 77 58  BILITOT 0.6 0.5  PROT 6.1 5.6*  ALBUMIN 2.7* 2.4*    Recent Labs Lab 02/28/14 0308  LIPASE 20   No results for input(s): AMMONIA in the last 168 hours. CBC:  Recent Labs Lab 02/22/14 0319  02/24/14 0405 02/26/14 1045  WBC 7.4  < > 4.7 4.3  NEUTROABS 4.0  --  1.9  --   HGB  11.7*  < > 10.4* 10.8*  HCT 35.8*  < > 32.1* 32.5*  MCV 83.6  < > 82.5 81.0  PLT 232  < > 194 202  < > = values in this interval not displayed. Cardiac Enzymes:  Recent Labs Lab 02/22/14 1013 02/22/14 1550 02/22/14 2218 02/24/14 0405  CKTOTAL  --   --   --  88  TROPONINI <0.03 0.03 <0.03  --    BNP: No results for input(s): PROBNP in the last 168 hours. D-Dimer: No results for input(s): DDIMER in the last 168 hours. CBG:  Recent Labs Lab 02/27/14 0607 02/27/14 1145 02/27/14 1642 02/27/14 2112 02/28/14 0007 02/28/14 0632  GLUCAP 133* 182* 235* 333* 239* 146*   Hemoglobin A1C:  Recent Labs Lab 02/22/14 1013  HGBA1C 13.1*   Fasting Lipid Panel:  Recent Labs Lab 02/22/14 1046  CHOL 255*  HDL 56  LDLCALC 180*  TRIG 94  CHOLHDL 4.6   Thyroid Function Tests:  Recent Labs Lab 02/22/14 1030  TSH 1.870   Coagulation:  Recent Labs Lab 02/26/14 0415  LABPROT 13.3  INR 1.00   Anemia Panel:  Recent Labs Lab 02/24/14 0906  VITAMINB12 350  FOLATE >20.0  FERRITIN 225  TIBC 206*  IRON 63  RETICCTPCT 1.5    Micro Results: No results found for this or any previous visit (from the past 240 hour(s)). Studies/Results: No results found. Medications: I have reviewed the patient's current medications. Scheduled Meds: . aspirin  81 mg Oral Daily  . atorvastatin  80 mg Oral q1800  . carvedilol  3.125 mg Oral BID WC  . Chlorhexidine Gluconate Cloth  6 each Topical Once   And  . Chlorhexidine Gluconate Cloth  6 each Topical Once  . enoxaparin (LOVENOX) injection  40 mg Subcutaneous Q24H  . insulin aspart  0-9 Units Subcutaneous TID WC  . insulin glargine  10 Units Subcutaneous QHS  . lisinopril  5 mg Oral Daily  . ondansetron (ZOFRAN) IV  4 mg Intravenous 3 times per day  . pantoprazole  20 mg Oral Daily   Continuous Infusions:  PRN Meds:.acetaminophen, HYDROcodone-acetaminophen, ondansetron (ZOFRAN) IV Assessment/Plan: Principal Problem:   Acute  dyspnea Active Problems:   Diabetes mellitus type 2 with complications, uncontrolled   Essential hypertension   Bilateral pleural effusion   CAP (community acquired pneumonia)   Dyspnea   Hyperglycemia   Acute systolic heart failure   CAD in native artery   Coronary artery disease involving native coronary artery of native heart without angina pectoris   Severe Multivessel CAD - Currently CP-free with no angina.  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  -Continue coreq 3.125 mg BID and lisinopril 5 mg daily  -Consider adding lasix and spironolactone at later time  -Monitor I & O's and daily weights   Uncontrolled Type 2 DM - Last CBG 147. Last A1c 13.1 on 02/22/14, non-compliant with insulin and metformin for past year -Continue Lantus 10 U and continue sensitive SSI with meals -Consider adding Novolog 4 U TID with meals if uncontrolled  -Hold home metformin 1000 mg BID  GERD - No current symptoms  -Continue protonix 20 mg daily  DVT Ppx: Per cath orders  Dispo: Disposition is deferred at this time, awaiting improvement of current medical problems.     The patient does have a current PCP ([name], MD) and does need an [facility] hospital follow-up appointment after discharge.  The patient does not have transportation limitations that hinder transportation to clinic appointments.  .Services Needed at time of discharge: Y = Yes, Blank = No PT:    OT:   RN:   Equipment:   Other:     LOS: 6 days   Juluis Mire, MD 02/28/2014, 10:31 AM

## 2014-02-28 NOTE — Progress Notes (Signed)
Patient Name: [name] Date of Encounter: 02/28/2014  Principal Problem:   Acute dyspnea Active Problems:   Diabetes mellitus type 2 with complications, uncontrolled   Essential hypertension   Bilateral pleural effusion   CAP (community acquired pneumonia)   Dyspnea   Hyperglycemia   Acute systolic heart failure   CAD in native artery   Length of Stay: 6  SUBJECTIVE  Feeling better, no further postprandial emesis. Denies angina or dyspnea  CURRENT MEDS . aspirin  81 mg Oral Daily  . atorvastatin  80 mg Oral q1800  . carvedilol  3.125 mg Oral BID WC  . enoxaparin (LOVENOX) injection  40 mg Subcutaneous Q24H  . insulin aspart  0-9 Units Subcutaneous TID WC  . insulin glargine  10 Units Subcutaneous QHS  . lisinopril  5 mg Oral Daily  . ondansetron (ZOFRAN) IV  4 mg Intravenous 3 times per day  . pantoprazole  20 mg Oral Daily    OBJECTIVE   Intake/Output Summary (Last 24 hours) at 02/28/14 0857 Last data filed at 02/28/14 0700  Gross per 24 hour  Intake    720 ml  Output    500 ml  Net    220 ml   Filed Weights   02/26/14 0557 02/27/14 0617 02/28/14 0527  Weight: 183 lb 6.8 oz (83.2 kg) 184 lb 4.8 oz (83.598 kg) 181 lb 8 oz (82.328 kg)    PHYSICAL EXAM Filed Vitals:   02/27/14 1429 02/27/14 2000 02/27/14 2051 02/28/14 0527  BP: 114/71  103/53 112/63  Pulse: 109  101 94  Temp: 98 F (36.7 C)  98.4 F (36.9 C) 98.8 F (37.1 C)  TempSrc: Oral  Oral Oral  Resp: 20 18 20 18   Height:      Weight:    181 lb 8 oz (82.328 kg)  SpO2: 95%  96% 96%   General: Alert, oriented x3, no distress Head: no evidence of trauma, PERRL, EOMI, no exophtalmos or lid lag, no myxedema, no xanthelasma; normal ears, nose and oropharynx Neck: normal jugular venous pulsations and no hepatojugular reflux; brisk carotid pulses without delay and no carotid bruits Chest: clear to auscultation, no signs of consolidation by percussion or palpation, normal fremitus, symmetrical  and full respiratory excursions Cardiovascular: normal position and quality of the apical impulse, regular rhythm, normal first and second heart sounds, no rubs or gallops, no murmur Abdomen: no tenderness or distention, no masses by palpation, no abnormal pulsatility or arterial bruits, normal bowel sounds, no hepatosplenomegaly Extremities: no clubbing, cyanosis or edema; 2+ radial, ulnar and brachial pulses bilaterally; 2+ right femoral, posterior tibial and dorsalis pedis pulses; 2+ left femoral, posterior tibial and dorsalis pedis pulses; no subclavian or femoral bruits Neurological: grossly nonfocal  LABS  CBC  Recent Labs  02/26/14 1045  WBC 4.3  HGB 10.8*  HCT 32.5*  MCV 81.0  PLT [id number]   Basic Metabolic Panel  Recent Labs  02/27/14 0545 02/28/14 0308  NA 134* 136  K 3.5 3.7  CL 101 103  CO2 30 28  GLUCOSE 148* 186*  BUN 9 8  CREATININE 0.97 1.00  CALCIUM 8.7 8.4  MG  --  1.9   Liver Function Tests  Recent Labs  02/28/14 0308  AST 21  ALT 19  ALKPHOS 58  BILITOT 0.5  PROT 5.6*  ALBUMIN 2.4*    Recent Labs  02/28/14 0308  LIPASE 20  Radiology Studies Imaging results have been reviewed and No results found.  TELE  NSR  ASSESSMENT AND PLAN Severe ischemic cardiomyopathy, EF 25-30 percent Acute systolic and diastolic heart failure, markedly improved Severe multivessel CAD for bypass surgery tomorrow morning. Will follow postop   Sanda Klein, MD, [facility] [phone] office [phone] pager 02/28/2014 8:57 AM

## 2014-02-28 NOTE — Progress Notes (Signed)
Patient stated that she drank a Boost before glucose monitoring, her blood sugar was 333.  Gave patient Lantus 8 u and rechecked blood sugar at 000.Noted to be at 235. Will continue to monitor at this time.

## 2014-03-01 ENCOUNTER — Encounter (HOSPITAL_COMMUNITY)
Admission: EM | Disposition: A | Discharge status: Home Health | Payer: BC Managed Care – PPO | Source: Home / Self Care | Attending: Internal Medicine

## 2014-03-01 ENCOUNTER — Inpatient Hospital Stay (HOSPITAL_COMMUNITY): Payer: BC Managed Care – PPO | Admitting: Anesthesiology

## 2014-03-01 ENCOUNTER — Inpatient Hospital Stay (HOSPITAL_COMMUNITY): Payer: BC Managed Care – PPO

## 2014-03-01 DIAGNOSIS — Z951 Presence of aortocoronary bypass graft: Secondary | ICD-10-CM

## 2014-03-01 DIAGNOSIS — I2510 Atherosclerotic heart disease of native coronary artery without angina pectoris: Secondary | ICD-10-CM

## 2014-03-01 HISTORY — PX: CORONARY ARTERY BYPASS GRAFT: SHX141

## 2014-03-01 HISTORY — DX: Presence of aortocoronary bypass graft: Z95.1

## 2014-03-01 HISTORY — PX: INTRAOPERATIVE TRANSESOPHAGEAL ECHOCARDIOGRAM: SHX5062

## 2014-03-01 LAB — POCT I-STAT 3, ART BLOOD GAS (G3+)
ACID-BASE DEFICIT: 4 mmol/L — AB (ref 0.0–2.0)
ACID-BASE DEFICIT: 4 mmol/L — AB (ref 0.0–2.0)
Acid-Base Excess: 2 mmol/L (ref 0.0–2.0)
Acid-base deficit: 3 mmol/L — ABNORMAL HIGH (ref 0.0–2.0)
BICARBONATE: 22.5 meq/L (ref 20.0–24.0)
BICARBONATE: 21.8 meq/L (ref 20.0–24.0)
Bicarbonate: 26.2 mEq/L — ABNORMAL HIGH (ref 20.0–24.0)
Bicarbonate: 22.9 mEq/L (ref 20.0–24.0)
O2 SAT: 93 %
O2 Saturation: 100 %
O2 Saturation: 96 %
O2 Saturation: 95 %
PCO2 ART: 43.8 mmHg (ref 35.0–45.0)
PH ART: 7.286 — AB (ref 7.350–7.450)
PH ART: 7.305 — AB (ref 7.350–7.450)
PO2 ART: 272 mmHg — AB (ref 80.0–100.0)
PO2 ART: 76 mmHg — AB (ref 80.0–100.0)
PO2 ART: 84 mmHg (ref 80.0–100.0)
Patient temperature: 35.4
Patient temperature: 36.9
Patient temperature: 37
TCO2: 27 mmol/L (ref 0–100)
TCO2: 24 mmol/L (ref 0–100)
TCO2: 24 mmol/L (ref 0–100)
TCO2: 23 mmol/L (ref 0–100)
pCO2 arterial: 37.5 mmHg (ref 35.0–45.0)
pCO2 arterial: 37 mmHg (ref 35.0–45.0)
pCO2 arterial: 47.9 mmHg — ABNORMAL HIGH (ref 35.0–45.0)
pH, Arterial: 7.453 — ABNORMAL HIGH (ref 7.350–7.450)
pH, Arterial: 7.384 (ref 7.350–7.450)
pO2, Arterial: 74 mmHg — ABNORMAL LOW (ref 80.0–100.0)

## 2014-03-01 LAB — BASIC METABOLIC PANEL
ANION GAP: 5 (ref 5–15)
BUN: 10 mg/dL (ref 6–23)
CO2: 28 mmol/L (ref 19–32)
CREATININE: 1.09 mg/dL (ref 0.50–1.10)
Calcium: 8.6 mg/dL (ref 8.4–10.5)
Chloride: 103 mmol/L (ref 96–112)
GFR, EST AFRICAN AMERICAN: 61 mL/min — AB (ref >90)
GFR, EST NON AFRICAN AMERICAN: 53 mL/min — AB (ref >90)
Glucose, Bld: 155 mg/dL — ABNORMAL HIGH (ref 70–99)
POTASSIUM: 3.5 mmol/L (ref 3.5–5.1)
Sodium: 136 mmol/L (ref 135–145)

## 2014-03-01 LAB — CBC
HCT: 31.3 % — ABNORMAL LOW (ref 36.0–46.0)
HEMATOCRIT: 32.6 % — AB (ref 36.0–46.0)
HEMOGLOBIN: 10.5 g/dL — AB (ref 12.0–15.0)
Hemoglobin: 10.4 g/dL — ABNORMAL LOW (ref 12.0–15.0)
MCH: 27.1 pg (ref 26.0–34.0)
MCH: 27.2 pg (ref 26.0–34.0)
MCHC: 32.2 g/dL (ref 30.0–36.0)
MCHC: 33.2 g/dL (ref 30.0–36.0)
MCV: 84.2 fL (ref 78.0–100.0)
MCV: 81.9 fL (ref 78.0–100.0)
Platelets: 182 10*3/uL (ref 150–400)
Platelets: 99 10*3/uL — ABNORMAL LOW (ref 150–400)
RBC: 3.87 MIL/uL (ref 3.87–5.11)
RBC: 3.82 MIL/uL — ABNORMAL LOW (ref 3.87–5.11)
RDW: 14.3 % (ref 11.5–15.5)
RDW: 14.5 % (ref 11.5–15.5)
WBC: 7 10*3/uL (ref 4.0–10.5)
WBC: 9.8 10*3/uL (ref 4.0–10.5)

## 2014-03-01 LAB — PROTIME-INR
INR: 1.01 (ref 0.00–1.49)
INR: 1.22 (ref 0.00–1.49)
PROTHROMBIN TIME: 15.6 s — AB (ref 11.6–15.2)
Prothrombin Time: 13.4 seconds (ref 11.6–15.2)

## 2014-03-01 LAB — BLOOD GAS, ARTERIAL
Acid-Base Excess: 3.5 mmol/L — ABNORMAL HIGH (ref 0.0–2.0)
BICARBONATE: 27.3 meq/L — AB (ref 20.0–24.0)
Drawn by: 35135
FIO2: 0.21 %
O2 SAT: 92.9 %
Patient temperature: 98.6
TCO2: 28.5 mmol/L (ref 0–100)
pCO2 arterial: 39.8 mmHg (ref 35.0–45.0)
pH, Arterial: 7.451 — ABNORMAL HIGH (ref 7.350–7.450)
pO2, Arterial: 66.5 mmHg — ABNORMAL LOW (ref 80.0–100.0)

## 2014-03-01 LAB — CREATININE, SERUM
Creatinine, Ser: 0.76 mg/dL (ref 0.50–1.10)
GFR calc Af Amer: >90 mL/min (ref >90)
GFR calc non Af Amer: 88 mL/min — ABNORMAL LOW (ref >90)

## 2014-03-01 LAB — POCT I-STAT, CHEM 8
BUN: 9 mg/dL (ref 6–23)
BUN: 7 mg/dL (ref 6–23)
BUN: 7 mg/dL (ref 6–23)
BUN: 6 mg/dL (ref 6–23)
BUN: 6 mg/dL (ref 6–23)
BUN: 7 mg/dL (ref 6–23)
CALCIUM ION: 1.19 mmol/L (ref 1.13–1.30)
CALCIUM ION: 1 mmol/L — AB (ref 1.13–1.30)
CALCIUM ION: 1.01 mmol/L — AB (ref 1.13–1.30)
CALCIUM ION: 1.05 mmol/L — AB (ref 1.13–1.30)
CALCIUM ION: 1.1 mmol/L — AB (ref 1.13–1.30)
CHLORIDE: 101 mmol/L (ref 96–112)
CHLORIDE: 104 mmol/L (ref 96–112)
CHLORIDE: 102 mmol/L (ref 96–112)
CHLORIDE: 107 mmol/L (ref 96–112)
Calcium, Ion: 1.2 mmol/L (ref 1.13–1.30)
Chloride: 98 mmol/L (ref 96–112)
Chloride: 101 mmol/L (ref 96–112)
Creatinine, Ser: 0.7 mg/dL (ref 0.50–1.10)
Creatinine, Ser: 0.8 mg/dL (ref 0.50–1.10)
Creatinine, Ser: 0.8 mg/dL (ref 0.50–1.10)
Creatinine, Ser: 0.7 mg/dL (ref 0.50–1.10)
Creatinine, Ser: 0.6 mg/dL (ref 0.50–1.10)
Creatinine, Ser: 0.7 mg/dL (ref 0.50–1.10)
GLUCOSE: 163 mg/dL — AB (ref 70–99)
Glucose, Bld: 108 mg/dL — ABNORMAL HIGH (ref 70–99)
Glucose, Bld: 95 mg/dL (ref 70–99)
Glucose, Bld: 97 mg/dL (ref 70–99)
Glucose, Bld: 210 mg/dL — ABNORMAL HIGH (ref 70–99)
Glucose, Bld: 204 mg/dL — ABNORMAL HIGH (ref 70–99)
HCT: 26 % — ABNORMAL LOW (ref 36.0–46.0)
HCT: 23 % — ABNORMAL LOW (ref 36.0–46.0)
HCT: 32 % — ABNORMAL LOW (ref 36.0–46.0)
HEMATOCRIT: 32 % — AB (ref 36.0–46.0)
HEMATOCRIT: 20 % — AB (ref 36.0–46.0)
HEMATOCRIT: 27 % — AB (ref 36.0–46.0)
HEMOGLOBIN: 7.8 g/dL — AB (ref 12.0–15.0)
HEMOGLOBIN: 6.8 g/dL — AB (ref 12.0–15.0)
HEMOGLOBIN: 10.9 g/dL — AB (ref 12.0–15.0)
Hemoglobin: 10.9 g/dL — ABNORMAL LOW (ref 12.0–15.0)
Hemoglobin: 8.8 g/dL — ABNORMAL LOW (ref 12.0–15.0)
Hemoglobin: 9.2 g/dL — ABNORMAL LOW (ref 12.0–15.0)
POTASSIUM: 4.6 mmol/L (ref 3.5–5.1)
Potassium: 3.5 mmol/L (ref 3.5–5.1)
Potassium: 3 mmol/L — ABNORMAL LOW (ref 3.5–5.1)
Potassium: 3.3 mmol/L — ABNORMAL LOW (ref 3.5–5.1)
Potassium: 4.1 mmol/L (ref 3.5–5.1)
Potassium: 3.9 mmol/L (ref 3.5–5.1)
SODIUM: 141 mmol/L (ref 135–145)
SODIUM: 139 mmol/L (ref 135–145)
SODIUM: 138 mmol/L (ref 135–145)
Sodium: 139 mmol/L (ref 135–145)
Sodium: 138 mmol/L (ref 135–145)
Sodium: 141 mmol/L (ref 135–145)
TCO2: 23 mmol/L (ref 0–100)
TCO2: 21 mmol/L (ref 0–100)
TCO2: 22 mmol/L (ref 0–100)
TCO2: 22 mmol/L (ref 0–100)
TCO2: 22 mmol/L (ref 0–100)
TCO2: 20 mmol/L (ref 0–100)

## 2014-03-01 LAB — MAGNESIUM: Magnesium: 2.9 mg/dL — ABNORMAL HIGH (ref 1.5–2.5)

## 2014-03-01 LAB — APTT
aPTT: 32 seconds (ref 24–37)
aPTT: 47 seconds — ABNORMAL HIGH (ref 24–37)

## 2014-03-01 LAB — POCT I-STAT GLUCOSE
GLUCOSE: 84 mg/dL (ref 70–99)
OPERATOR ID: 3406

## 2014-03-01 LAB — PLATELET COUNT: Platelets: 96 10*3/uL — ABNORMAL LOW (ref 150–400)

## 2014-03-01 LAB — POCT I-STAT 4, (NA,K, GLUC, HGB,HCT)
GLUCOSE: 224 mg/dL — AB (ref 70–99)
HEMATOCRIT: 35 % — AB (ref 36.0–46.0)
Hemoglobin: 11.9 g/dL — ABNORMAL LOW (ref 12.0–15.0)
POTASSIUM: 3.8 mmol/L (ref 3.5–5.1)
Sodium: 141 mmol/L (ref 135–145)

## 2014-03-01 LAB — SURGICAL PCR SCREEN
MRSA, PCR: NEGATIVE
Staphylococcus aureus: NEGATIVE

## 2014-03-01 LAB — HEMOGLOBIN AND HEMATOCRIT, BLOOD
HCT: 17.7 % — ABNORMAL LOW (ref 36.0–46.0)
Hemoglobin: 5.9 g/dL — CL (ref 12.0–15.0)

## 2014-03-01 LAB — GLUCOSE, CAPILLARY: Glucose-Capillary: 116 mg/dL — ABNORMAL HIGH (ref 70–99)

## 2014-03-01 LAB — PREPARE RBC (CROSSMATCH)

## 2014-03-01 SURGERY — CORONARY ARTERY BYPASS GRAFTING (CABG)
Anesthesia: General | Site: Chest

## 2014-03-01 MED ORDER — PROPOFOL 10 MG/ML IV BOLUS
INTRAVENOUS | Status: AC
  Filled 2014-03-01: qty 20

## 2014-03-01 MED ORDER — METOPROLOL TARTRATE 25 MG/10 ML ORAL SUSPENSION
12.5000 mg | Freq: Two times a day (BID) | ORAL | Status: DC
  Filled 2014-03-01 (×5): qty 5

## 2014-03-01 MED ORDER — ASPIRIN 81 MG PO CHEW: 324.0000 mg | CHEWABLE_TABLET | Freq: Every day | ORAL | Status: DC

## 2014-03-01 MED ORDER — BISACODYL 5 MG PO TBEC
10.0000 mg | DELAYED_RELEASE_TABLET | Freq: Every day | ORAL | Status: DC
  Administered 2014-03-02 – 2014-03-04 (×3): 10 mg via ORAL
  Filled 2014-03-01 (×3): qty 2

## 2014-03-01 MED ORDER — MORPHINE SULFATE 2 MG/ML IJ SOLN
2.0000 mg | INTRAMUSCULAR | Status: DC | PRN
  Administered 2014-03-02 (×2): 2 mg via INTRAVENOUS
  Filled 2014-03-01 (×2): qty 1

## 2014-03-01 MED ORDER — SODIUM CHLORIDE 0.9 % IV SOLN
INTRAVENOUS | Status: DC
  Filled 2014-03-01: qty 2.5

## 2014-03-01 MED ORDER — SODIUM CHLORIDE 0.9 % IJ SOLN
3.0000 mL | Freq: Two times a day (BID) | INTRAMUSCULAR | Status: DC
  Administered 2014-03-02 – 2014-03-04 (×4): 3 mL via INTRAVENOUS

## 2014-03-01 MED ORDER — HEPARIN SODIUM (PORCINE) 1000 UNIT/ML IJ SOLN
INTRAMUSCULAR | Status: DC | PRN
  Administered 2014-03-01: 23000 [IU] via INTRAVENOUS

## 2014-03-01 MED ORDER — FENTANYL CITRATE 0.05 MG/ML IJ SOLN
INTRAMUSCULAR | Status: AC
  Filled 2014-03-01: qty 5

## 2014-03-01 MED ORDER — FAMOTIDINE IN NACL 20-0.9 MG/50ML-% IV SOLN
20.0000 mg | Freq: Two times a day (BID) | INTRAVENOUS | Status: DC
  Administered 2014-03-01: 20 mg via INTRAVENOUS
  Filled 2014-03-01: qty 50

## 2014-03-01 MED ORDER — NITROGLYCERIN IN D5W 200-5 MCG/ML-% IV SOLN: 0.0000 ug/min | INTRAVENOUS | Status: DC

## 2014-03-01 MED ORDER — LACTATED RINGERS IV SOLN: INTRAVENOUS | Status: DC

## 2014-03-01 MED ORDER — ONDANSETRON HCL 4 MG/2ML IJ SOLN
4.0000 mg | Freq: Four times a day (QID) | INTRAMUSCULAR | Status: DC | PRN
  Administered 2014-03-02 – 2014-03-04 (×7): 4 mg via INTRAVENOUS
  Filled 2014-03-01 (×7): qty 2

## 2014-03-01 MED ORDER — ALBUMIN HUMAN 5 % IV SOLN
250.0000 mL | INTRAVENOUS | Status: AC | PRN
  Administered 2014-03-01 (×2): 250 mL via INTRAVENOUS

## 2014-03-01 MED ORDER — SODIUM CHLORIDE 0.9 % IV SOLN
0.5000 g/h | INTRAVENOUS | Status: DC
  Filled 2014-03-01: qty 20

## 2014-03-01 MED ORDER — SODIUM CHLORIDE 0.9 % IV SOLN
250.0000 mL | INTRAVENOUS | Status: DC
  Administered 2014-03-01: 250 mL via INTRAVENOUS

## 2014-03-01 MED ORDER — FENTANYL CITRATE 0.05 MG/ML IJ SOLN
INTRAMUSCULAR | Status: DC | PRN
  Administered 2014-03-01: 50 ug via INTRAVENOUS
  Administered 2014-03-01 (×8): 100 ug via INTRAVENOUS
  Administered 2014-03-01: 150 ug via INTRAVENOUS
  Administered 2014-03-01: 50 ug via INTRAVENOUS
  Administered 2014-03-01: 250 ug via INTRAVENOUS
  Administered 2014-03-01 (×4): 100 ug via INTRAVENOUS
  Administered 2014-03-01: 50 ug via INTRAVENOUS

## 2014-03-01 MED ORDER — ETOMIDATE 2 MG/ML IV SOLN
INTRAVENOUS | Status: AC
  Filled 2014-03-01: qty 10

## 2014-03-01 MED ORDER — TRAMADOL HCL 50 MG PO TABS: 50.0000 mg | ORAL_TABLET | ORAL | Status: DC | PRN

## 2014-03-01 MED ORDER — HEPARIN SODIUM (PORCINE) 1000 UNIT/ML IJ SOLN
INTRAMUSCULAR | Status: AC
  Filled 2014-03-01: qty 1

## 2014-03-01 MED ORDER — LIDOCAINE HCL (CARDIAC) 20 MG/ML IV SOLN
INTRAVENOUS | Status: DC | PRN
  Administered 2014-03-01: 100 mg via INTRAVENOUS

## 2014-03-01 MED ORDER — LIDOCAINE HCL (CARDIAC) 20 MG/ML IV SOLN
INTRAVENOUS | Status: AC
  Filled 2014-03-01: qty 5

## 2014-03-01 MED ORDER — ROCURONIUM BROMIDE 100 MG/10ML IV SOLN
INTRAVENOUS | Status: DC | PRN
  Administered 2014-03-01: 50 mg via INTRAVENOUS

## 2014-03-01 MED ORDER — ACETAMINOPHEN 650 MG RE SUPP
650.0000 mg | Freq: Once | RECTAL | Status: AC
  Administered 2014-03-01: 650 mg via RECTAL

## 2014-03-01 MED ORDER — DOCUSATE SODIUM 100 MG PO CAPS
200.0000 mg | ORAL_CAPSULE | Freq: Every day | ORAL | Status: DC
  Administered 2014-03-02 – 2014-03-04 (×3): 200 mg via ORAL
  Filled 2014-03-01 (×5): qty 2

## 2014-03-01 MED ORDER — PROTAMINE SULFATE 10 MG/ML IV SOLN
INTRAVENOUS | Status: AC
  Filled 2014-03-01: qty 25

## 2014-03-01 MED ORDER — PHENYLEPHRINE HCL 10 MG/ML IJ SOLN
10.0000 mg | INTRAVENOUS | Status: DC | PRN
  Administered 2014-03-01: 100 ug/min via INTRAVENOUS

## 2014-03-01 MED ORDER — DOPAMINE-DEXTROSE 3.2-5 MG/ML-% IV SOLN: 0.0000 ug/kg/min | INTRAVENOUS | Status: DC

## 2014-03-01 MED ORDER — MAGNESIUM SULFATE 4 GM/100ML IV SOLN
4.0000 g | Freq: Once | INTRAVENOUS | Status: AC
  Administered 2014-03-01: 4 g via INTRAVENOUS
  Filled 2014-03-01: qty 100

## 2014-03-01 MED ORDER — PROPOFOL 10 MG/ML IV BOLUS
INTRAVENOUS | Status: DC | PRN
  Administered 2014-03-01: 50 mg via INTRAVENOUS

## 2014-03-01 MED ORDER — PROTAMINE SULFATE 10 MG/ML IV SOLN
INTRAVENOUS | Status: DC | PRN
  Administered 2014-03-01 (×3): 20 mg via INTRAVENOUS
  Administered 2014-03-01: 30 mg via INTRAVENOUS
  Administered 2014-03-01 (×5): 20 mg via INTRAVENOUS

## 2014-03-01 MED ORDER — OXYCODONE HCL 5 MG PO TABS
5.0000 mg | ORAL_TABLET | ORAL | Status: DC | PRN
  Administered 2014-03-02: 5 mg via ORAL
  Administered 2014-03-03 – 2014-03-06 (×8): 10 mg via ORAL
  Filled 2014-03-01 (×9): qty 2

## 2014-03-01 MED ORDER — SODIUM CHLORIDE 0.9 % IJ SOLN: 3.0000 mL | INTRAMUSCULAR | Status: DC | PRN

## 2014-03-01 MED ORDER — METOPROLOL TARTRATE 12.5 MG HALF TABLET
12.5000 mg | ORAL_TABLET | Freq: Two times a day (BID) | ORAL | Status: DC
  Administered 2014-03-02 (×2): 12.5 mg via ORAL
  Filled 2014-03-01 (×5): qty 1

## 2014-03-01 MED ORDER — ACETAMINOPHEN 160 MG/5ML PO SOLN
1000.0000 mg | Freq: Four times a day (QID) | ORAL | Status: DC
  Administered 2014-03-02: 1000 mg
  Filled 2014-03-01: qty 40.6

## 2014-03-01 MED ORDER — SODIUM CHLORIDE 0.9 % IJ SOLN
OROMUCOSAL | Status: DC | PRN
  Administered 2014-03-01 (×3): 1000 mL via TOPICAL

## 2014-03-01 MED ORDER — ACETAMINOPHEN 500 MG PO TABS
1000.0000 mg | ORAL_TABLET | Freq: Four times a day (QID) | ORAL | Status: DC
  Administered 2014-03-02 – 2014-03-06 (×15): 1000 mg via ORAL
  Filled 2014-03-01 (×18): qty 2

## 2014-03-01 MED ORDER — ASPIRIN EC 325 MG PO TBEC
325.0000 mg | DELAYED_RELEASE_TABLET | Freq: Every day | ORAL | Status: DC
  Administered 2014-03-02 – 2014-03-06 (×5): 325 mg via ORAL
  Filled 2014-03-01 (×5): qty 1

## 2014-03-01 MED ORDER — BISACODYL 10 MG RE SUPP: 10.0000 mg | Freq: Every day | RECTAL | Status: DC

## 2014-03-01 MED ORDER — INSULIN REGULAR BOLUS VIA INFUSION
0.0000 [IU] | Freq: Three times a day (TID) | INTRAVENOUS | Status: DC
  Filled 2014-03-01: qty 10

## 2014-03-01 MED ORDER — VECURONIUM BROMIDE 10 MG IV SOLR
INTRAVENOUS | Status: DC | PRN
Start: 2014-03-01 — End: 2014-03-01
  Administered 2014-03-01: 4 mg via INTRAVENOUS
  Administered 2014-03-01: 2 mg via INTRAVENOUS
  Administered 2014-03-01: 4 mg via INTRAVENOUS

## 2014-03-01 MED ORDER — PANTOPRAZOLE SODIUM 40 MG PO TBEC
40.0000 mg | DELAYED_RELEASE_TABLET | Freq: Every day | ORAL | Status: DC
  Administered 2014-03-03 – 2014-03-05 (×3): 40 mg via ORAL
  Filled 2014-03-01 (×3): qty 1

## 2014-03-01 MED ORDER — MILRINONE IN DEXTROSE 20 MG/100ML IV SOLN
0.1250 ug/kg/min | INTRAVENOUS | Status: AC
  Administered 2014-03-01: 0.3 ug/kg/min via INTRAVENOUS
  Filled 2014-03-01: qty 100

## 2014-03-01 MED ORDER — ALBUMIN HUMAN 5 % IV SOLN
INTRAVENOUS | Status: DC | PRN
  Administered 2014-03-01: 15:00:00 via INTRAVENOUS

## 2014-03-01 MED ORDER — ACETAMINOPHEN 160 MG/5ML PO SOLN: 650.0000 mg | Freq: Once | ORAL | Status: AC

## 2014-03-01 MED ORDER — MIDAZOLAM HCL 10 MG/2ML IJ SOLN
INTRAMUSCULAR | Status: AC
  Filled 2014-03-01: qty 2

## 2014-03-01 MED ORDER — SODIUM CHLORIDE 0.9 % IV SOLN: INTRAVENOUS | Status: DC

## 2014-03-01 MED ORDER — PHENYLEPHRINE HCL 10 MG/ML IJ SOLN
0.0000 ug/min | INTRAVENOUS | Status: DC
  Filled 2014-03-01: qty 2

## 2014-03-01 MED ORDER — HEMOSTATIC AGENTS (NO CHARGE) OPTIME
TOPICAL | Status: DC | PRN
  Administered 2014-03-01: 1 via TOPICAL

## 2014-03-01 MED ORDER — DEXTROSE 5 % IV SOLN
1.5000 g | Freq: Two times a day (BID) | INTRAVENOUS | Status: AC
  Administered 2014-03-01 – 2014-03-03 (×4): 1.5 g via INTRAVENOUS
  Filled 2014-03-01 (×4): qty 1.5

## 2014-03-01 MED ORDER — SODIUM CHLORIDE 0.9 % IV SOLN
INTRAVENOUS | Status: DC | PRN
  Administered 2014-03-01: 11:00:00 via INTRAVENOUS

## 2014-03-01 MED ORDER — LACTATED RINGERS IV SOLN
INTRAVENOUS | Status: DC | PRN
  Administered 2014-03-01 (×6): via INTRAVENOUS

## 2014-03-01 MED ORDER — MIDAZOLAM HCL 5 MG/5ML IJ SOLN
INTRAMUSCULAR | Status: DC | PRN
  Administered 2014-03-01: 4 mg via INTRAVENOUS
  Administered 2014-03-01: 5 mg via INTRAVENOUS
  Administered 2014-03-01: 1 mg via INTRAVENOUS

## 2014-03-01 MED ORDER — SODIUM CHLORIDE 0.45 % IV SOLN
INTRAVENOUS | Status: DC
  Administered 2014-03-01: 15:00:00 via INTRAVENOUS

## 2014-03-01 MED ORDER — METOPROLOL TARTRATE 1 MG/ML IV SOLN
2.5000 mg | INTRAVENOUS | Status: DC | PRN
Start: 2014-03-01 — End: 2014-03-06

## 2014-03-01 MED ORDER — LACTATED RINGERS IV SOLN: 500.0000 mL | Freq: Once | INTRAVENOUS | Status: AC | PRN

## 2014-03-01 MED ORDER — 0.9 % SODIUM CHLORIDE (POUR BTL) OPTIME
TOPICAL | Status: DC | PRN
  Administered 2014-03-01: 6000 mL

## 2014-03-01 MED ORDER — VANCOMYCIN HCL IN DEXTROSE 1-5 GM/200ML-% IV SOLN
1000.0000 mg | Freq: Once | INTRAVENOUS | Status: AC
  Administered 2014-03-01: 1000 mg via INTRAVENOUS
  Filled 2014-03-01: qty 200

## 2014-03-01 MED ORDER — MILRINONE IN DEXTROSE 20 MG/100ML IV SOLN
0.1250 ug/kg/min | INTRAVENOUS | Status: AC
  Administered 2014-03-01: .3 ug/kg/min via INTRAVENOUS
  Filled 2014-03-01: qty 100

## 2014-03-01 MED ORDER — EPINEPHRINE HCL 0.1 MG/ML IJ SOSY: PREFILLED_SYRINGE | INTRAMUSCULAR | Status: DC | PRN

## 2014-03-01 MED ORDER — MIDAZOLAM HCL 2 MG/2ML IJ SOLN: 2.0000 mg | INTRAMUSCULAR | Status: DC | PRN

## 2014-03-01 MED ORDER — EPHEDRINE SULFATE 50 MG/ML IJ SOLN
INTRAMUSCULAR | Status: DC | PRN
  Administered 2014-03-01: 5 mg via INTRAVENOUS
  Administered 2014-03-01: 10 mg via INTRAVENOUS

## 2014-03-01 MED ORDER — DEXMEDETOMIDINE HCL IN NACL 200 MCG/50ML IV SOLN: 0.0000 ug/kg/h | INTRAVENOUS | Status: DC

## 2014-03-01 MED ORDER — MORPHINE SULFATE 2 MG/ML IJ SOLN: 1.0000 mg | INTRAMUSCULAR | Status: AC | PRN

## 2014-03-01 MED ORDER — POTASSIUM CHLORIDE 10 MEQ/50ML IV SOLN
10.0000 meq | INTRAVENOUS | Status: AC
  Administered 2014-03-01 (×3): 10 meq via INTRAVENOUS

## 2014-03-01 MED FILL — Lidocaine HCl IV Inj 20 MG/ML: INTRAVENOUS | Qty: 5 | Status: AC

## 2014-03-01 MED FILL — Sodium Bicarbonate IV Soln 8.4%: INTRAVENOUS | Qty: 50 | Status: AC

## 2014-03-01 MED FILL — Electrolyte-R (PH 7.4) Solution: INTRAVENOUS | Qty: 5000 | Status: AC

## 2014-03-01 MED FILL — Sodium Chloride IV Soln 0.9%: INTRAVENOUS | Qty: 2000 | Status: AC

## 2014-03-01 MED FILL — Mannitol IV Soln 20%: INTRAVENOUS | Qty: 500 | Status: AC

## 2014-03-01 MED FILL — Heparin Sodium (Porcine) Inj 1000 Unit/ML: INTRAMUSCULAR | Qty: 10 | Status: AC

## 2014-03-01 SURGICAL SUPPLY — 82 items
ADPR TBG 2 MALE LL ART (MISCELLANEOUS) ×2
ARTERIAL PRESSURE LINE (MISCELLANEOUS) ×2 IMPLANT
ATTRACTOMAT 16X20 MAGNETIC DRP (DRAPES) ×2 IMPLANT
BAG DECANTER FOR FLEXI CONT (MISCELLANEOUS) ×3 IMPLANT
BANDAGE ELASTIC 4 VELCRO ST LF (GAUZE/BANDAGES/DRESSINGS) ×3 IMPLANT
BANDAGE ELASTIC 6 VELCRO ST LF (GAUZE/BANDAGES/DRESSINGS) ×3 IMPLANT
BLADE STERNUM SYSTEM 6 (BLADE) ×3 IMPLANT
BNDG GAUZE ELAST 4 BULKY (GAUZE/BANDAGES/DRESSINGS) ×3 IMPLANT
CANISTER SUCTION 2500CC (MISCELLANEOUS) ×3 IMPLANT
CANNULA VEN 2 STAGE (MISCELLANEOUS) ×1 IMPLANT
CANNULA VESSEL 3MM BLUNT TIP (CANNULA) ×2 IMPLANT
CARDIAC SUCTION (MISCELLANEOUS) ×2 IMPLANT
CATH CPB KIT GERHARDT (MISCELLANEOUS) ×3 IMPLANT
CATH THORACIC 28FR (CATHETERS) ×3 IMPLANT
COVER SURGICAL LIGHT HANDLE (MISCELLANEOUS) ×3 IMPLANT
CRADLE DONUT ADULT HEAD (MISCELLANEOUS) ×3 IMPLANT
DRAIN CHANNEL 28F RND 3/8 FF (WOUND CARE) ×3 IMPLANT
DRAPE CARDIOVASCULAR INCISE (DRAPES) ×3
DRAPE SLUSH/WARMER DISC (DRAPES) ×3 IMPLANT
DRAPE SRG 135X102X78XABS (DRAPES) ×2 IMPLANT
DRSG AQUACEL AG ADV 3.5X14 (GAUZE/BANDAGES/DRESSINGS) ×3 IMPLANT
ELECT BLADE 4.0 EZ CLEAN MEGAD (MISCELLANEOUS) ×3
ELECT REM PT RETURN 9FT ADLT (ELECTROSURGICAL) ×6
ELECTRODE BLDE 4.0 EZ CLN MEGD (MISCELLANEOUS) ×2 IMPLANT
ELECTRODE REM PT RTRN 9FT ADLT (ELECTROSURGICAL) ×4 IMPLANT
GAUZE SPONGE 4X4 12PLY STRL (GAUZE/BANDAGES/DRESSINGS) ×6 IMPLANT
GLOVE BIO SURGEON STRL SZ 6 (GLOVE) ×6 IMPLANT
GLOVE BIO SURGEON STRL SZ 6.5 (GLOVE) ×19 IMPLANT
GLOVE BIOGEL PI IND STRL 6 (GLOVE) IMPLANT
GLOVE BIOGEL PI IND STRL 7.0 (GLOVE) ×2 IMPLANT
GLOVE BIOGEL PI INDICATOR 6 (GLOVE) ×2
GLOVE BIOGEL PI INDICATOR 7.0 (GLOVE) ×2
GOWN STRL REUS W/ TWL LRG LVL3 (GOWN DISPOSABLE) ×8 IMPLANT
GOWN STRL REUS W/TWL LRG LVL3 (GOWN DISPOSABLE) ×12
HEMOSTAT POWDER SURGIFOAM 1G (HEMOSTASIS) ×9 IMPLANT
HEMOSTAT SURGICEL 2X14 (HEMOSTASIS) ×3 IMPLANT
IV ADAPTER SYR DOUBLE MALE LL (MISCELLANEOUS) ×2 IMPLANT
KIT BASIN OR (CUSTOM PROCEDURE TRAY) ×3 IMPLANT
KIT ROOM TURNOVER OR (KITS) ×3 IMPLANT
KIT SUCTION CATH 14FR (SUCTIONS) ×6 IMPLANT
KIT VASOVIEW W/TROCAR VH 2000 (KITS) ×3 IMPLANT
LEAD PACING MYOCARDI (MISCELLANEOUS) ×3 IMPLANT
MARKER GRAFT CORONARY BYPASS (MISCELLANEOUS) ×9 IMPLANT
NS IRRIG 1000ML POUR BTL (IV SOLUTION) ×15 IMPLANT
PACK OPEN HEART (CUSTOM PROCEDURE TRAY) ×3 IMPLANT
PAD ARMBOARD 7.5X6 YLW CONV (MISCELLANEOUS) ×6 IMPLANT
PAD ELECT DEFIB RADIOL ZOLL (MISCELLANEOUS) ×3 IMPLANT
PENCIL BUTTON HOLSTER BLD 10FT (ELECTRODE) ×3 IMPLANT
SET CARDIOPLEGIA MPS 5001102 (MISCELLANEOUS) ×2 IMPLANT
SPONGE GAUZE 4X4 12PLY STER LF (GAUZE/BANDAGES/DRESSINGS) ×2 IMPLANT
SPONGE LAP 18X18 X RAY DECT (DISPOSABLE) ×6 IMPLANT
STOPCOCK MORSE 400PSI 3WAY (MISCELLANEOUS) ×1 IMPLANT
SUT BONE WAX W31G (SUTURE) ×3 IMPLANT
SUT ETHIBOND 2 0 SH (SUTURE) ×3
SUT ETHIBOND 2 0 SH 36X2 (SUTURE) ×1 IMPLANT
SUT PROLENE 3 0 SH1 36 (SUTURE) ×3 IMPLANT
SUT PROLENE 4 0 TF (SUTURE) ×6 IMPLANT
SUT PROLENE 6 0 C 1 30 (SUTURE) ×4 IMPLANT
SUT PROLENE 6 0 CC (SUTURE) ×10 IMPLANT
SUT PROLENE 7 0 BV 1 (SUTURE) ×4 IMPLANT
SUT PROLENE 7 0 BV1 MDA (SUTURE) ×7 IMPLANT
SUT PROLENE 7.0 RB 3 (SUTURE) ×2 IMPLANT
SUT PROLENE 8 0 BV175 6 (SUTURE) ×9 IMPLANT
SUT SILK 2 0 SH CR/8 (SUTURE) ×1 IMPLANT
SUT STEEL 6MS V (SUTURE) ×3 IMPLANT
SUT STEEL SZ 6 DBL 3X14 BALL (SUTURE) ×3 IMPLANT
SUT VIC AB 1 CTX 18 (SUTURE) ×6 IMPLANT
SUT VIC AB 2-0 CT1 27 (SUTURE) ×3
SUT VIC AB 2-0 CT1 TAPERPNT 27 (SUTURE) ×1 IMPLANT
SUT VIC AB 3-0 X1 27 (SUTURE) ×1 IMPLANT
SUTURE E-PAK OPEN HEART (SUTURE) ×3 IMPLANT
SYSTEM SAHARA CHEST DRAIN ATS (WOUND CARE) ×3 IMPLANT
TAPE CLOTH SURG 4X10 WHT LF (GAUZE/BANDAGES/DRESSINGS) ×2 IMPLANT
TAPE PAPER 2X10 WHT MICROPORE (GAUZE/BANDAGES/DRESSINGS) ×2 IMPLANT
TOWEL OR 17X24 6PK STRL BLUE (TOWEL DISPOSABLE) ×6 IMPLANT
TOWEL OR 17X26 10 PK STRL BLUE (TOWEL DISPOSABLE) ×6 IMPLANT
TRAY CATH LUMEN 1 20CM STRL (SET/KITS/TRAYS/PACK) ×1 IMPLANT
TRAY FOLEY IC TEMP SENS 16FR (CATHETERS) ×3 IMPLANT
TUBE FEEDING 8FR 16IN STR KANG (MISCELLANEOUS) ×3 IMPLANT
TUBING INSUFFLATION (TUBING) ×3 IMPLANT
UNDERPAD 30X30 INCONTINENT (UNDERPADS AND DIAPERS) ×3 IMPLANT
WATER STERILE IRR 1000ML POUR (IV SOLUTION) ×6 IMPLANT

## 2014-03-01 NOTE — OR Nursing (Signed)
14:02 - Called volunteer desk to notify family of closing per Dr. Servando Snare

## 2014-03-01 NOTE — Transfer of Care (Signed)
Immediate Anesthesia Transfer of Care Note  Patient: [name]  Procedure(s) Performed: Procedure(s): CORONARY ARTERY BYPASS GRAFTING (CABG) x five, using left internal mammary artery and right leg greater saphenous vein harvested endoscopically (N/A) INTRAOPERATIVE TRANSESOPHAGEAL ECHOCARDIOGRAM (N/A)  Patient Location: ICU  Anesthesia Type:General  Level of Consciousness: sedated  Airway & Oxygen Therapy: Patient remains intubated per anesthesia plan and Patient placed on Ventilator (see vital sign flow sheet for setting)  Post-op Assessment: Report given to RN, Post -op Vital signs reviewed and stable and Patient moving all extremities  Post vital signs: Reviewed and stable  Last Vitals:  Filed Vitals:   03/01/14 1513  BP: 113/54  Pulse: 101  Temp:   Resp: 12    Complications: No apparent anesthesia complications

## 2014-03-01 NOTE — OR Nursing (Signed)
13:37 - 1st call to SICU charge nurse

## 2014-03-01 NOTE — Brief Op Note (Addendum)
02/22/2014 - 03/01/2014      [address]       ,[place] [address]             [phone]     02/22/2014 - 03/01/2014  12:23 PM  PATIENT:  [name]  64 y.o. female  PRE-OPERATIVE DIAGNOSIS:  CAD  POST-OPERATIVE DIAGNOSIS:  coronary artery disease  PROCEDURE:  Procedure(s): CORONARY ARTERY BYPASS GRAFTING (CABG)X5 LIMA-LAD; SEQ SVG-DIAG1-DIAG2; SVG-OM; SVG-PD INTRAOPERATIVE TRANSESOPHAGEAL ECHOCARDIOGRAM [facility] RIGHT LEG  SURGEON:  Surgeon(s): Grace Isaac, MD  PHYSICIAN ASSISTANT: WAYNE GOLD PA-C  ANESTHESIA:   general  PATIENT CONDITION:  ICU - intubated and hemodynamically stable.  PRE-OPERATIVE WEIGHT: 84kg  EBL- SEE ANEST/PERFUSION RECORDS  COMPLICATIONS: NO KNOWN

## 2014-03-01 NOTE — Progress Notes (Signed)
Back to full support at this time per abg results.

## 2014-03-01 NOTE — Anesthesia Preprocedure Evaluation (Signed)
Anesthesia Evaluation  Patient identified by MRN, date of birth, ID band Patient awake    Reviewed: Allergy & Precautions, NPO status , Patient's Chart, lab work & pertinent test results  Airway Mallampati: II  TM Distance: >3 FB Neck ROM: Full    Dental no notable dental hx.    Pulmonary shortness of breath, pneumonia -,  breath sounds clear to auscultation  Pulmonary exam normal       Cardiovascular hypertension, Pt. on medications + CAD Rhythm:Regular Rate:Normal     Neuro/Psych negative neurological ROS  negative psych ROS   GI/Hepatic Neg liver ROS, GERD-  Medicated,  Endo/Other  diabetes, Type 2, Oral Hypoglycemic Agents, Insulin Dependent  Renal/GU negative Renal ROS     Musculoskeletal negative musculoskeletal ROS (+)   Abdominal (+) + obese,   Peds  Hematology negative hematology ROS (+)   Anesthesia Other Findings   Reproductive/Obstetrics negative OB ROS                             Anesthesia Physical Anesthesia Plan  ASA: IV  Anesthesia Plan: General   Post-op Pain Management:    Induction: Intravenous  Airway Management Planned: Oral ETT  Additional Equipment: Arterial line, PA Cath, TEE, CVP and Ultrasound Guidance Line Placement  Intra-op Plan:   Post-operative Plan: Post-operative intubation/ventilation  Informed Consent: I have reviewed the patients History and Physical, chart, labs and discussed the procedure including the risks, benefits and alternatives for the proposed anesthesia with the patient or authorized representative who has indicated his/her understanding and acceptance.   Dental advisory given  Plan Discussed with: CRNA  Anesthesia Plan Comments:         Anesthesia Quick Evaluation

## 2014-03-01 NOTE — Progress Notes (Signed)
S/p CABG x 5  Intubated, sedated  BP 113/54 mmHg  Pulse 90  Temp(Src) 97.5 F (36.4 C) (Core (Comment))  Resp 12  Ht 5\' 2"  (1.575 m)  Wt 185 lb 6.4 oz (84.097 kg)  BMI 33.90 kg/m2  SpO2 95%   Intake/Output Summary (Last 24 hours) at 03/01/14 1808 Last data filed at 03/01/14 1458  Gross per 24 hour  Intake   4880 ml  Output   3630 ml  Net   1250 ml    150 ml from tubes postop  CI 1.4 on dopamine at 2 and milrinone at 0.3  Increase dopamine to 3, give albumin to improve preload

## 2014-03-01 NOTE — Progress Notes (Signed)
*   Echocardiogram Echocardiogram Transesophageal has been performed.  Darlina Sicilian M 03/01/2014, 8:28 AM

## 2014-03-01 NOTE — Progress Notes (Signed)
Per Dr Kennith Center, hold this morning Coreg in lieu of scheduled metoprolol scheduled pre-cabg. Tresa Endo

## 2014-03-01 NOTE — OR Nursing (Signed)
14:30 - 2nd call to SICU

## 2014-03-01 NOTE — Anesthesia Procedure Notes (Signed)
Procedures Procedures: Right IJ Gordy Councilman Catheter Insertion: [id number]: The patient was identified and consent obtained.  TO was performed, and full barrier precautions were used.  The skin was anesthetized with lidocaine-4cc plain with 25g needle.  Once the vein was located with the 22 ga. needle using ultrasound guidance , the wire was inserted into the vein.  The wire location was confirmed with ultrasound.  The tissue was dilated and the 8.5 [place] cordis catheter was carefully inserted. Afterwards Gordy Councilman catheter was inserted. PA catheter at 45cm.  The patient tolerated the procedure well.   CE

## 2014-03-02 ENCOUNTER — Inpatient Hospital Stay (HOSPITAL_COMMUNITY): Payer: BC Managed Care – PPO

## 2014-03-02 ENCOUNTER — Encounter (HOSPITAL_COMMUNITY): Payer: Self-pay | Admitting: Cardiothoracic Surgery

## 2014-03-02 LAB — CBC
HCT: 30.1 % — ABNORMAL LOW (ref 36.0–46.0)
HCT: 30 % — ABNORMAL LOW (ref 36.0–46.0)
HEMOGLOBIN: 10 g/dL — AB (ref 12.0–15.0)
Hemoglobin: 10 g/dL — ABNORMAL LOW (ref 12.0–15.0)
MCH: 27 pg (ref 26.0–34.0)
MCH: 26.7 pg (ref 26.0–34.0)
MCHC: 33.2 g/dL (ref 30.0–36.0)
MCHC: 33.3 g/dL (ref 30.0–36.0)
MCV: 81.1 fL (ref 78.0–100.0)
MCV: 80.2 fL (ref 78.0–100.0)
Platelets: 94 10*3/uL — ABNORMAL LOW (ref 150–400)
Platelets: 102 10*3/uL — ABNORMAL LOW (ref 150–400)
RBC: 3.71 MIL/uL — AB (ref 3.87–5.11)
RBC: 3.74 MIL/uL — ABNORMAL LOW (ref 3.87–5.11)
RDW: 14.7 % (ref 11.5–15.5)
RDW: 14.8 % (ref 11.5–15.5)
WBC: 8.6 10*3/uL (ref 4.0–10.5)
WBC: 10.8 10*3/uL — AB (ref 4.0–10.5)

## 2014-03-02 LAB — POCT I-STAT 3, ART BLOOD GAS (G3+)
ACID-BASE DEFICIT: 3 mmol/L — AB (ref 0.0–2.0)
Acid-base deficit: 1 mmol/L (ref 0.0–2.0)
Acid-base deficit: 3 mmol/L — ABNORMAL HIGH (ref 0.0–2.0)
Acid-base deficit: 6 mmol/L — ABNORMAL HIGH (ref 0.0–2.0)
BICARBONATE: 24.3 meq/L — AB (ref 20.0–24.0)
Bicarbonate: 20 mEq/L (ref 20.0–24.0)
Bicarbonate: 21.9 mEq/L (ref 20.0–24.0)
Bicarbonate: 23 mEq/L (ref 20.0–24.0)
O2 SAT: 96 %
O2 SAT: 98 %
O2 Saturation: 95 %
O2 Saturation: 96 %
PCO2 ART: 37.9 mmHg (ref 35.0–45.0)
PH ART: 7.307 — AB (ref 7.350–7.450)
PH ART: 7.313 — AB (ref 7.350–7.450)
PO2 ART: 108 mmHg — AB (ref 80.0–100.0)
PO2 ART: 85 mmHg (ref 80.0–100.0)
Patient temperature: 36.9
Patient temperature: 37.4
Patient temperature: 98.6
TCO2: 21 mmol/L (ref 0–100)
TCO2: 23 mmol/L (ref 0–100)
TCO2: 24 mmol/L (ref 0–100)
TCO2: 26 mmol/L (ref 0–100)
pCO2 arterial: 40 mmHg (ref 35.0–45.0)
pCO2 arterial: 43.8 mmHg (ref 35.0–45.0)
pCO2 arterial: 45.4 mmHg — ABNORMAL HIGH (ref 35.0–45.0)
pH, Arterial: 7.353 (ref 7.350–7.450)
pH, Arterial: 7.37 (ref 7.350–7.450)
pO2, Arterial: 83 mmHg (ref 80.0–100.0)
pO2, Arterial: 91 mmHg (ref 80.0–100.0)

## 2014-03-02 LAB — CREATININE, SERUM
Creatinine, Ser: 1.06 mg/dL (ref 0.50–1.10)
GFR, EST AFRICAN AMERICAN: 63 mL/min — AB (ref >90)
GFR, EST NON AFRICAN AMERICAN: 55 mL/min — AB (ref >90)

## 2014-03-02 LAB — GLUCOSE, CAPILLARY
GLUCOSE-CAPILLARY: 159 mg/dL — AB (ref 70–99)
GLUCOSE-CAPILLARY: 218 mg/dL — AB (ref 70–99)
Glucose-Capillary: 101 mg/dL — ABNORMAL HIGH (ref 70–99)
Glucose-Capillary: 103 mg/dL — ABNORMAL HIGH (ref 70–99)
Glucose-Capillary: 113 mg/dL — ABNORMAL HIGH (ref 70–99)
Glucose-Capillary: 133 mg/dL — ABNORMAL HIGH (ref 70–99)
Glucose-Capillary: 162 mg/dL — ABNORMAL HIGH (ref 70–99)
Glucose-Capillary: 167 mg/dL — ABNORMAL HIGH (ref 70–99)
Glucose-Capillary: 169 mg/dL — ABNORMAL HIGH (ref 70–99)
Glucose-Capillary: 182 mg/dL — ABNORMAL HIGH (ref 70–99)
Glucose-Capillary: 212 mg/dL — ABNORMAL HIGH (ref 70–99)
Glucose-Capillary: 94 mg/dL (ref 70–99)
Glucose-Capillary: 97 mg/dL (ref 70–99)

## 2014-03-02 LAB — POCT I-STAT, CHEM 8
BUN: 11 mg/dL (ref 6–23)
Calcium, Ion: 1.14 mmol/L (ref 1.13–1.30)
Chloride: 104 mmol/L (ref 96–112)
Creatinine, Ser: 1 mg/dL (ref 0.50–1.10)
Glucose, Bld: 118 mg/dL — ABNORMAL HIGH (ref 70–99)
HEMATOCRIT: 32 % — AB (ref 36.0–46.0)
Hemoglobin: 10.9 g/dL — ABNORMAL LOW (ref 12.0–15.0)
Potassium: 4.3 mmol/L (ref 3.5–5.1)
Sodium: 141 mmol/L (ref 135–145)
TCO2: 22 mmol/L (ref 0–100)

## 2014-03-02 LAB — BASIC METABOLIC PANEL
Anion gap: 6 (ref 5–15)
BUN: 8 mg/dL (ref 6–23)
CO2: 25 mmol/L (ref 19–32)
Calcium: 7.5 mg/dL — ABNORMAL LOW (ref 8.4–10.5)
Chloride: 110 mmol/L (ref 96–112)
Creatinine, Ser: 0.88 mg/dL (ref 0.50–1.10)
GFR calc Af Amer: 79 mL/min — ABNORMAL LOW (ref >90)
GFR calc non Af Amer: 68 mL/min — ABNORMAL LOW (ref >90)
Glucose, Bld: 104 mg/dL — ABNORMAL HIGH (ref 70–99)
Potassium: 3.8 mmol/L (ref 3.5–5.1)
Sodium: 141 mmol/L (ref 135–145)

## 2014-03-02 LAB — MAGNESIUM
MAGNESIUM: 2.6 mg/dL — AB (ref 1.5–2.5)
MAGNESIUM: 2.6 mg/dL — AB (ref 1.5–2.5)

## 2014-03-02 MED ORDER — SODIUM BICARBONATE 8.4 % IV SOLN
INTRAVENOUS | Status: AC
  Filled 2014-03-02: qty 50

## 2014-03-02 MED ORDER — INSULIN DETEMIR 100 UNIT/ML ~~LOC~~ SOLN
20.0000 [IU] | Freq: Once | SUBCUTANEOUS | Status: AC
Start: 2014-03-02 — End: 2014-03-02
  Administered 2014-03-02: 20 [IU] via SUBCUTANEOUS
  Filled 2014-03-02: qty 0.2

## 2014-03-02 MED ORDER — ENOXAPARIN SODIUM 30 MG/0.3ML ~~LOC~~ SOLN
30.0000 mg | SUBCUTANEOUS | Status: DC
  Administered 2014-03-02 – 2014-03-03 (×2): 30 mg via SUBCUTANEOUS
  Filled 2014-03-02 (×3): qty 0.3

## 2014-03-02 MED ORDER — INSULIN DETEMIR 100 UNIT/ML ~~LOC~~ SOLN
20.0000 [IU] | Freq: Every day | SUBCUTANEOUS | Status: DC
  Filled 2014-03-02: qty 0.2

## 2014-03-02 MED ORDER — SODIUM BICARBONATE 8.4 % IV SOLN
50.0000 meq | Freq: Once | INTRAVENOUS | Status: AC
  Administered 2014-03-02: 50 meq via INTRAVENOUS

## 2014-03-02 MED ORDER — METOCLOPRAMIDE HCL 5 MG/ML IJ SOLN
10.0000 mg | Freq: Three times a day (TID) | INTRAMUSCULAR | Status: AC
  Administered 2014-03-02 (×3): 10 mg via INTRAVENOUS
  Filled 2014-03-02 (×2): qty 2

## 2014-03-02 MED ORDER — CETYLPYRIDINIUM CHLORIDE 0.05 % MT LIQD
7.0000 mL | Freq: Two times a day (BID) | OROMUCOSAL | Status: DC
  Administered 2014-03-02 (×2): 7 mL via OROMUCOSAL

## 2014-03-02 MED ORDER — CHLORHEXIDINE GLUCONATE 0.12 % MT SOLN
15.0000 mL | Freq: Two times a day (BID) | OROMUCOSAL | Status: DC
  Administered 2014-03-02 (×2): 15 mL via OROMUCOSAL
  Filled 2014-03-02: qty 15

## 2014-03-02 MED ORDER — FUROSEMIDE 10 MG/ML IJ SOLN
40.0000 mg | Freq: Once | INTRAMUSCULAR | Status: AC
  Administered 2014-03-02: 40 mg via INTRAVENOUS

## 2014-03-02 MED ORDER — INSULIN ASPART 100 UNIT/ML ~~LOC~~ SOLN
0.0000 [IU] | SUBCUTANEOUS | Status: DC
  Administered 2014-03-02: 8 [IU] via SUBCUTANEOUS
  Administered 2014-03-03: 5 [IU] via SUBCUTANEOUS

## 2014-03-02 MED ORDER — CETYLPYRIDINIUM CHLORIDE 0.05 % MT LIQD: 7.0000 mL | Freq: Two times a day (BID) | OROMUCOSAL | Status: DC

## 2014-03-02 NOTE — Progress Notes (Signed)
Patient ID: [name], female   DOB: 06-Nov-1950, 64 y.o.   MRN: [id number]  SICU Evening Rounds:  Hemodynamically stable, off dopamine.  Diuresing well  Evening labs pending.  She is awake and alert

## 2014-03-02 NOTE — Anesthesia Postprocedure Evaluation (Signed)
  Anesthesia Post-op Note  Patient: [name]  Procedure(s) Performed: Procedure(s): CORONARY ARTERY BYPASS GRAFTING (CABG) x five, using left internal mammary artery and right leg greater saphenous vein harvested endoscopically (N/A) INTRAOPERATIVE TRANSESOPHAGEAL ECHOCARDIOGRAM (N/A)  Patient Location: ICU  Anesthesia Type:General  Level of Consciousness: Patient remains intubated per anesthesia plan  Airway and Oxygen Therapy: Patient remains intubated per anesthesia plan  Post-op Pain: none  Post-op Assessment: Post-op Vital signs reviewed  Post-op Vital Signs: Reviewed  Last Vitals:  Filed Vitals:   03/02/14 0700  BP: 108/63  Pulse: 103  Temp: 37.2 C  Resp: 25    Complications: No apparent anesthesia complications

## 2014-03-02 NOTE — Procedures (Signed)
Extubation Procedure Note  Patient Details:   Name: [name] DOB: [birth year] MRN: [id number]   Airway Documentation:     Evaluation  O2 sats: stable throughout Complications: No apparent complications Patient did tolerate procedure well. Bilateral Breath Sounds: Rhonchi Suctioning: Airway Yes  Rossi Silvestro 03/02/2014, 1:28 AM Pt able to blow VC of 1.0 and NIF of -40cmh2o.  Extubation to 6lpm [place].

## 2014-03-02 NOTE — Progress Notes (Signed)
Nutrition Brief Note  Patient identified on [place] Report  Wt Readings from Last 15 Encounters:  03/02/14 202 lb 2.6 oz (91.7 kg)  12/12/12 195 lb 8 oz (88.678 kg)  04/26/11 199 lb 11.2 oz (90.583 kg)  08/17/10 207 lb 12.8 oz (94.257 kg)  08/10/10 207 lb 12.8 oz (94.257 kg)  07/27/10 204 lb 3.2 oz (92.625 kg)  08/19/09 198 lb 9.6 oz (90.084 kg)  05/18/09 197 lb 8 oz (89.585 kg)  02/10/08 200 lb 11.2 oz (91.037 kg)  08/21/07 209 lb 1 oz (94.83 kg)  08/08/07 204 lb (92.534 kg)  07/23/07 204 lb 12.8 oz (92.897 kg)  07/01/07 209 lb (94.802 kg)  09/16/06 211 lb 12.8 oz (96.072 kg)    Body mass index is 36.97 kg/(m^2). Patient meets criteria for obese based on current BMI.   Current diet order is Clear liquid, patient is consuming approximately 100% of meals (1/31) at this time. Labs and medications reviewed.   Spoke to patient. She reported no problems eating, changes in oral intake, or weight changes prior to admission. She is complaining of nausea now.   No nutrition interventions warranted at this time. If nutrition issues arise, please consult RD.   Burtis Junes RD, LDN Nutrition [id number] 03/02/2014 10:31 AM

## 2014-03-02 NOTE — Progress Notes (Signed)
Patient ID: [name], female   DOB: 01-16-1951, 64 y.o.   MRN: [id number] TCTS DAILY ICU PROGRESS NOTE                   [address]            [place], [address]          [phone]   1 Day Post-Op Procedure(s) (LRB): CORONARY ARTERY BYPASS GRAFTING (CABG) x five, using left internal mammary artery and right leg greater saphenous vein harvested endoscopically (N/A) INTRAOPERATIVE TRANSESOPHAGEAL ECHOCARDIOGRAM (N/A)  Total Length of Stay:  LOS: 8 days   Subjective: Extubated, awake and alert  Objective: Vital signs in last 24 hours: Temp:  [95.4 F (35.2 C)-99.3 F (37.4 C)] 99 F (37.2 C) (02/02 0700) Pulse Rate:  [40-112] 103 (02/02 0700) Cardiac Rhythm:  [-] Normal sinus rhythm;Sinus tachycardia (02/02 0600) Resp:  [10-39] 25 (02/02 0700) BP: (83-126)/(48-80) 108/63 mmHg (02/02 0700) SpO2:  [95 %-100 %] 100 % (02/02 0700) Arterial Line BP: (85-162)/(43-129) 120/54 mmHg (02/02 0700) FiO2 (%):  [40 %-50 %] 40 % (02/02 0100) Weight:  [202 lb 2.6 oz (91.7 kg)] 202 lb 2.6 oz (91.7 kg) (02/02 0409)  Filed Weights   02/28/14 0527 03/01/14 0428 03/02/14 0409  Weight: 181 lb 8 oz (82.328 kg) 185 lb 6.4 oz (84.097 kg) 202 lb 2.6 oz (91.7 kg)    Weight change: 16 lb 12.2 oz (7.603 kg)   Hemodynamic parameters for last 24 hours: PAP: (18-42)/(7-24) 27/14 mmHg CO:  [2.6 L/min-5 L/min] 5 L/min CI:  [1.4 L/min/m2-2.7 L/min/m2] 2.7 L/min/m2  Intake/Output from previous day: 02/01 0701 - 02/02 0700 In: 7062.7 [I.V.:5062.7; Blood:590; NG/GT:60; IV Piggyback:1350] Out: [id number] [Urine:2720; Blood:1530; Chest Tube:745]  Intake/Output this shift:    Current Meds: Scheduled Meds: . acetaminophen  1,000 mg Oral 4 times per day   Or  . acetaminophen (TYLENOL) oral liquid 160 mg/5 mL  1,000 mg Per Tube 4 times per day  . antiseptic oral rinse  7 mL Mouth Rinse BID  . aspirin EC  325 mg Oral Daily   Or  . aspirin  324 mg Per Tube Daily  . atorvastatin  80  mg Oral q1800  . bisacodyl  10 mg Oral Daily   Or  . bisacodyl  10 mg Rectal Daily  . cefUROXime (ZINACEF)  IV  1.5 g Intravenous Q12H  . docusate sodium  200 mg Oral Daily  . famotidine (PEPCID) IV  20 mg Intravenous Q12H  . insulin regular  0-10 Units Intravenous TID WC  . metoprolol tartrate  12.5 mg Oral BID   Or  . metoprolol tartrate  12.5 mg Per Tube BID  . [START ON 03/03/2014] pantoprazole  40 mg Oral Daily  . sodium chloride  3 mL Intravenous Q12H   Continuous Infusions: . sodium chloride 20 mL/hr at 03/01/14 1500  . sodium chloride 250 mL (03/01/14 1500)  . sodium chloride 10 mL/hr at 03/01/14 1500  . dexmedetomidine Stopped (03/01/14 1700)  . DOPamine 4 mcg/kg/min (03/02/14 0700)  . insulin (NOVOLIN-R) infusion 1.2 Units/hr (03/02/14 0700)  . lactated ringers 20 mL/hr at 03/01/14 1500  . milrinone 0.3 mcg/kg/min (03/02/14 0700)  . nitroGLYCERIN Stopped (03/01/14 1500)  . phenylephrine (NEO-SYNEPHRINE) Adult infusion 10 mcg/min (03/02/14 0700)   PRN Meds:.albumin human, metoprolol, midazolam, morphine injection, ondansetron (ZOFRAN) IV, oxyCODONE, sodium chloride, traMADol  General appearance: alert and cooperative Neurologic: intact Heart: regular rate and rhythm, S1, S2 normal,  no murmur, click, rub or gallop Lungs: diminished breath sounds bibasilar Abdomen: soft, non-tender; bowel sounds normal; no masses,  no organomegaly Extremities: extremities normal, atraumatic, no cyanosis or edema and Homans sign is negative, no sign of DVT Wound: sternum stable  Lab Results: CBC: Recent Labs  03/01/14 2000 03/02/14 0400  WBC 9.8 8.6  HGB 10.4* 10.0*  HCT 31.3* 30.1*  PLT 99* 94*   BMET:  Recent Labs  03/01/14 0347  03/01/14 1958 03/01/14 2000 03/02/14 0400  NA 136  < > 141  --  141  K 3.5  < > 4.6  --  3.8  CL 103  < > 107  --  110  CO2 28  --   --   --  25  GLUCOSE 155*  < > 204*  --  104*  BUN 10  < > 7  --  8  CREATININE 1.09  < > 0.70 0.76 0.88    CALCIUM 8.6  --   --   --  7.5*  < > = values in this interval not displayed.  PT/INR:  Recent Labs  03/01/14 2000  LABPROT 15.6*  INR 1.22   Radiology: Dg Chest Portable 1 View  03/01/2014   CLINICAL DATA:  Incorrect instrument count after bypass surgery, "eyelash size" needle.  EXAM: PORTABLE CHEST - 1 VIEW  COMPARISON:  02/22/2014  FINDINGS: There is no definite retained suture needle, although multiple vascular clips along the left mediastinum could have a similar appearance.  Endotracheal tube with tip at the clavicular heads. Swan-Ganz catheter from right IJ approach, tip at the pulmonary outflow tract or main pulmonary artery. There are surgical thoracic drains.  Unremarkable heart size and aortic contours post CABG. Haziness of the right chest, with volume loss, suggesting combination of atelectasis and pleural fluid. No pneumothorax.  These results were called by telephone at the time of interpretation on 03/01/2014 at 2:46 pm to OR staff, who verbally acknowledged these results.  IMPRESSION: 1. No identifiable retained needle. 2. Atelectasis or layering effusion on the right.   Electronically Signed   By: Jorje Guild M.D.   On: 03/01/2014 14:47     Assessment/Plan: S/P Procedure(s) (LRB): CORONARY ARTERY BYPASS GRAFTING (CABG) x five, using left internal mammary artery and right leg greater saphenous vein harvested endoscopically (N/A) INTRAOPERATIVE TRANSESOPHAGEAL ECHOCARDIOGRAM (N/A) Mobilize Diuresis Diabetes control Continue foley due to strict I&O, patient critically ill, patient in ICU and urinary output monitoring See progression orders Expected Acute  Blood - loss Anemia Wean milrinone as tolerated With poor lv function prop will need ace when can tolerate Poor preop DM control add insulin sq today and get off drip  Aubreyana Saltz B 03/02/2014 7:39 AM

## 2014-03-03 ENCOUNTER — Inpatient Hospital Stay (HOSPITAL_COMMUNITY): Payer: BC Managed Care – PPO

## 2014-03-03 LAB — CBC
HCT: 28.8 % — ABNORMAL LOW (ref 36.0–46.0)
Hemoglobin: 9.8 g/dL — ABNORMAL LOW (ref 12.0–15.0)
MCH: 27.3 pg (ref 26.0–34.0)
MCHC: 34 g/dL (ref 30.0–36.0)
MCV: 80.2 fL (ref 78.0–100.0)
Platelets: 102 10*3/uL — ABNORMAL LOW (ref 150–400)
RBC: 3.59 MIL/uL — ABNORMAL LOW (ref 3.87–5.11)
RDW: 14.8 % (ref 11.5–15.5)
WBC: 9.8 10*3/uL (ref 4.0–10.5)

## 2014-03-03 LAB — GLUCOSE, CAPILLARY
GLUCOSE-CAPILLARY: 96 mg/dL (ref 70–99)
GLUCOSE-CAPILLARY: 99 mg/dL (ref 70–99)
GLUCOSE-CAPILLARY: 92 mg/dL (ref 70–99)
GLUCOSE-CAPILLARY: 114 mg/dL — AB (ref 70–99)
GLUCOSE-CAPILLARY: 106 mg/dL — AB (ref 70–99)
GLUCOSE-CAPILLARY: 149 mg/dL — AB (ref 70–99)
Glucose-Capillary: 115 mg/dL — ABNORMAL HIGH (ref 70–99)
Glucose-Capillary: 104 mg/dL — ABNORMAL HIGH (ref 70–99)
Glucose-Capillary: 97 mg/dL (ref 70–99)
Glucose-Capillary: 96 mg/dL (ref 70–99)
Glucose-Capillary: 100 mg/dL — ABNORMAL HIGH (ref 70–99)
Glucose-Capillary: 102 mg/dL — ABNORMAL HIGH (ref 70–99)
Glucose-Capillary: 99 mg/dL (ref 70–99)
Glucose-Capillary: 83 mg/dL (ref 70–99)

## 2014-03-03 LAB — BASIC METABOLIC PANEL
Anion gap: 4 — ABNORMAL LOW (ref 5–15)
BUN: 14 mg/dL (ref 6–23)
CO2: 29 mmol/L (ref 19–32)
Calcium: 7.9 mg/dL — ABNORMAL LOW (ref 8.4–10.5)
Chloride: 105 mmol/L (ref 96–112)
Creatinine, Ser: 1.02 mg/dL (ref 0.50–1.10)
GFR calc Af Amer: 66 mL/min — ABNORMAL LOW (ref >90)
GFR calc non Af Amer: 57 mL/min — ABNORMAL LOW (ref >90)
Glucose, Bld: 137 mg/dL — ABNORMAL HIGH (ref 70–99)
Potassium: 4 mmol/L (ref 3.5–5.1)
Sodium: 138 mmol/L (ref 135–145)

## 2014-03-03 MED ORDER — FOLIC ACID 1 MG PO TABS
1.0000 mg | ORAL_TABLET | Freq: Every day | ORAL | Status: DC
  Administered 2014-03-03 – 2014-03-06 (×4): 1 mg via ORAL
  Filled 2014-03-03 (×4): qty 1

## 2014-03-03 MED ORDER — SODIUM CHLORIDE 0.9 % IJ SOLN
3.0000 mL | Freq: Two times a day (BID) | INTRAMUSCULAR | Status: DC
  Administered 2014-03-03 – 2014-03-05 (×4): 3 mL via INTRAVENOUS

## 2014-03-03 MED ORDER — SODIUM CHLORIDE 0.9 % IV SOLN: 250.0000 mL | INTRAVENOUS | Status: DC | PRN

## 2014-03-03 MED ORDER — INSULIN ASPART 100 UNIT/ML ~~LOC~~ SOLN
0.0000 [IU] | Freq: Three times a day (TID) | SUBCUTANEOUS | Status: DC
  Administered 2014-03-03 – 2014-03-04 (×3): 2 [IU] via SUBCUTANEOUS
  Administered 2014-03-05: 4 [IU] via SUBCUTANEOUS
  Administered 2014-03-05: 2 [IU] via SUBCUTANEOUS

## 2014-03-03 MED ORDER — FUROSEMIDE 40 MG PO TABS
40.0000 mg | ORAL_TABLET | Freq: Every day | ORAL | Status: DC
  Administered 2014-03-03: 40 mg via ORAL
  Filled 2014-03-03 (×2): qty 1

## 2014-03-03 MED ORDER — MOVING RIGHT ALONG BOOK
Freq: Once | Status: AC
  Filled 2014-03-03: qty 1

## 2014-03-03 MED ORDER — POTASSIUM CHLORIDE CRYS ER 20 MEQ PO TBCR
20.0000 meq | EXTENDED_RELEASE_TABLET | Freq: Every day | ORAL | Status: DC
  Administered 2014-03-03: 20 meq via ORAL
  Filled 2014-03-03 (×2): qty 1

## 2014-03-03 MED ORDER — CARVEDILOL 6.25 MG PO TABS
6.2500 mg | ORAL_TABLET | Freq: Two times a day (BID) | ORAL | Status: DC
  Administered 2014-03-03 – 2014-03-06 (×7): 6.25 mg via ORAL
  Filled 2014-03-03 (×9): qty 1

## 2014-03-03 MED ORDER — METFORMIN HCL 500 MG PO TABS: 1000.0000 mg | ORAL_TABLET | Freq: Two times a day (BID) | ORAL | Status: DC

## 2014-03-03 MED ORDER — FERROUS SULFATE 325 (65 FE) MG PO TABS
325.0000 mg | ORAL_TABLET | Freq: Every day | ORAL | Status: DC
  Administered 2014-03-03 – 2014-03-06 (×4): 325 mg via ORAL
  Filled 2014-03-03 (×5): qty 1

## 2014-03-03 MED ORDER — SODIUM CHLORIDE 0.9 % IJ SOLN: 3.0000 mL | INTRAMUSCULAR | Status: DC | PRN

## 2014-03-03 MED FILL — Heparin Sodium (Porcine) Inj 1000 Unit/ML: INTRAMUSCULAR | Qty: 30 | Status: AC

## 2014-03-03 MED FILL — Potassium Chloride Inj 2 mEq/ML: INTRAVENOUS | Qty: 40 | Status: AC

## 2014-03-03 MED FILL — Magnesium Sulfate Inj 50%: INTRAMUSCULAR | Qty: 10 | Status: AC

## 2014-03-03 NOTE — Progress Notes (Addendum)
[address]       ,[place] [address]             [phone]        2 Days Post-Op Procedure(s) (LRB): CORONARY ARTERY BYPASS GRAFTING (CABG) x five, using left internal mammary artery and right leg greater saphenous vein harvested endoscopically (N/A) INTRAOPERATIVE TRANSESOPHAGEAL ECHOCARDIOGRAM (N/A)  Subjective: Patient sitting in chair awake and alert. No specific complaints. Passing flatus.  Objective: Vital signs in last 24 hours: Temp:  [98.2 F (36.8 C)-99.3 F (37.4 C)] 98.3 F (36.8 C) (02/03 0818) Pulse Rate:  [92-116] 103 (02/03 0800) Cardiac Rhythm:  [-] Sinus tachycardia (02/03 0800) Resp:  [10-27] 25 (02/03 0800) BP: (91-155)/(58-103) 141/85 mmHg (02/03 0800) SpO2:  [90 %-100 %] 95 % (02/03 0800) Arterial Line BP: (95-152)/(47-68) 125/68 mmHg (02/02 1500) Weight:  [198 lb 12.8 oz (90.175 kg)] 198 lb 12.8 oz (90.175 kg) (02/03 0500)  Pre op weight 84 kg Current Weight  03/03/14 198 lb 12.8 oz (90.175 kg)    Hemodynamic parameters for last 24 hours: PAP: (21-41)/(5-23) 41/22 mmHg  Intake/Output from previous day: 02/02 0701 - 02/03 0700 In: 788.3 [I.V.:658.3; IV Piggyback:100] Out: 1920 [Urine:1850; Chest Tube:70]   Physical Exam:  Cardiovascular: RRR Pulmonary:Diminished at bases bilaterally; no rales, wheezes, or rhonchi. Abdomen: Soft, non tender, bowel sounds present. Extremities: Mild bilateral lower extremity edema. Wounds: Dressings are clean and dry.    Lab Results: CBC: Recent Labs  03/02/14 1715 03/02/14 1724 03/03/14 0500  WBC 10.8*  --  9.8  HGB 10.0* 10.9* 9.8*  HCT 30.0* 32.0* 28.8*  PLT 102*  --  102*   BMET:  Recent Labs  03/02/14 0400  03/02/14 1724 03/03/14 0500  NA 141  --  141 138  K 3.8  --  4.3 4.0  CL 110  --  104 105  CO2 25  --   --  29  GLUCOSE 104*  --  118* 137*  BUN 8  --  11 14  CREATININE 0.88  < > 1.00 1.02  CALCIUM 7.5*  --   --  7.9*  < > = values in this interval not  displayed.  PT/INR:  Lab Results  Component Value Date   INR 1.22 03/01/2014   INR 1.01 03/01/2014   INR 1.00 02/26/2014   ABG:  INR: Will add last result for INR, ABG once components are confirmed Will add last 4 CBG results once components are confirmed  Assessment/Plan:  1. CV - ST. On Lopressor 12.5 bid. Will change to Coreg 6.125 bid.Will also start low dose ACE. 2.  Pulmonary - On 1 liter of oxygen via Omer-wean as tolerates. CXR shows trace left apical pneumothorax, small bilateral pleural effusions and atelectasis.Encourage incentive spirometer 3. Volume Overload -  Lasix 40 daily 4.  Acute blood loss anemia - H and H 9.8 and 28.8. Start oral iron and folic acid 5. Thrombocytopenia-platelets stable at 102,000 6. DM-CBGs 218/159/114. On Insulin. Will restart Metformin as taken pre op. Pre op HGA1C 13.1. Will need close follow up with medical doctor after discharge. 7. Remove foley and sleeve 8. Transfer to [facility] 03/03/2014,8:31 AM  Patient progressing well , with significant lv dysfunction, continue beta blocker, coreg and ace as tolerated. I have seen and examined [name] and agree with the above assessment  and plan.  Grace Isaac MD [facility] [phone] Office [phone] 03/03/2014 9:13 AM

## 2014-03-03 NOTE — Evaluation (Signed)
Physical Therapy Evaluation Patient Details Name: [name] MRN: [id number] DOB: [birth year] Today's Date: 03/03/2014   History of Present Illness  64 year old woman with history of diabetes mellitus, hypertension, hyperlipidemia, GERD. Pt adm 02/22/14 due to SOB and new onset of LE edema.   2-D echocardiogram showed diffuse hypokinesis with LVEF of 25-30%. Pt underwent CABG on 03/01/14.  Clinical Impression  Patient is s/p above surgery resulting in functional limitations due to the deficits listed below (see PT Problem List). Pt very motivated to return to independence. Patient will benefit from skilled PT to increase their independence and safety with mobility to allow discharge to the venue listed below.  Patient needs to practice stairs next session.        Follow Up Recommendations No PT follow up;Supervision/Assistance - 24 hour    Equipment Recommendations  None recommended by PT    Recommendations for Other Services       Precautions / Restrictions Precautions Precautions: Sternal Precaution Comments: educated on sternal precautions  Restrictions Weight Bearing Restrictions:  (sternal )      Mobility  Bed Mobility               General bed mobility comments: up in chair  Transfers Overall transfer level: Needs assistance Equipment used: Rolling walker (2 wheeled) Transfers: Sit to/from Stand Sit to Stand: Supervision         General transfer comment: cues for sternal precautions and hand placement   Ambulation/Gait Ambulation/Gait assistance: Min guard Ambulation Distance (Feet): 200 Feet Assistive device: Rolling walker (2 wheeled);None Gait Pattern/deviations: Step-through pattern;Decreased stride length;Shuffle Gait velocity: decr Gait velocity interpretation: Below normal speed for age/gender General Gait Details: cues for technique with RW; pt able to ambulate without RW for ~30' without LOB; no overt LOB noted  Stairs             Wheelchair Mobility    Modified Rankin (Stroke Patients Only)       Balance Overall balance assessment: Needs assistance Sitting-balance support: Feet supported;No upper extremity supported Sitting balance-Leahy Scale: Good     Standing balance support: During functional activity;No upper extremity supported Standing balance-Leahy Scale: Fair                               Pertinent Vitals/Pain Pain Assessment: No/denies pain    Home Living Family/patient expects to be discharged to:: Private residence Living Arrangements: Children Available Help at Discharge: Family;Available 24 hours/day Type of Home: House Home Access: Stairs to enter Entrance Stairs-Rails: Right Entrance Stairs-Number of Steps: 4-5 Home Layout: One level Home Equipment: None      Prior Function Level of Independence: Independent               Hand Dominance        Extremity/Trunk Assessment   Upper Extremity Assessment: Defer to OT evaluation           Lower Extremity Assessment: Generalized weakness      Cervical / Trunk Assessment: Normal  Communication   Communication: No difficulties  Cognition Arousal/Alertness: Awake/alert Behavior During Therapy: WFL for tasks assessed/performed Overall Cognitive Status: Within Functional Limits for tasks assessed                      General Comments      Exercises        Assessment/Plan    PT Assessment Patient needs continued PT services  PT  Diagnosis Generalized weakness;Abnormality of gait   PT Problem List Decreased strength;Decreased activity tolerance;Decreased balance;Decreased mobility;Decreased knowledge of precautions;Pain;Cardiopulmonary status limiting activity  PT Treatment Interventions DME instruction;Gait training;Functional mobility training;Therapeutic activities;Therapeutic exercise;Balance training;Neuromuscular re-education;Patient/family education;Stair training   PT Goals  (Current goals can be found in the Care Plan section) Acute Rehab PT Goals Patient Stated Goal: to go home as soon as i can PT Goal Formulation: With patient Time For Goal Achievement: 03/10/14 Potential to Achieve Goals: Good    Frequency Min 3X/week   Barriers to discharge        Co-evaluation               End of Session   Activity Tolerance: Patient tolerated treatment well Patient left: in chair;with call bell/phone within reach;with family/visitor present Nurse Communication: Mobility status         Time: 1325-1336 PT Time Calculation (min) (ACUTE ONLY): 11 min   Charges:   PT Evaluation $Initial PT Evaluation Tier I: 1 Procedure     PT G CodesGustavus Bryant, [place] 03/03/2014, 2:17 PM

## 2014-03-04 ENCOUNTER — Inpatient Hospital Stay (HOSPITAL_COMMUNITY): Payer: BC Managed Care – PPO

## 2014-03-04 LAB — BASIC METABOLIC PANEL
Anion gap: 6 (ref 5–15)
BUN: 19 mg/dL (ref 6–23)
CHLORIDE: 106 mmol/L (ref 96–112)
CO2: 26 mmol/L (ref 19–32)
CREATININE: 1.16 mg/dL — AB (ref 0.50–1.10)
Calcium: 8.2 mg/dL — ABNORMAL LOW (ref 8.4–10.5)
GFR calc non Af Amer: 49 mL/min — ABNORMAL LOW (ref >90)
GFR, EST AFRICAN AMERICAN: 57 mL/min — AB (ref >90)
GLUCOSE: 110 mg/dL — AB (ref 70–99)
POTASSIUM: 5 mmol/L (ref 3.5–5.1)
SODIUM: 138 mmol/L (ref 135–145)

## 2014-03-04 LAB — TYPE AND SCREEN
ABO/RH(D): B POS
Antibody Screen: NEGATIVE
Unit division: 0
Unit division: 0
Unit division: 0
Unit division: 0
Unit division: 0
Unit division: 0

## 2014-03-04 LAB — GLUCOSE, CAPILLARY
GLUCOSE-CAPILLARY: 83 mg/dL (ref 70–99)
GLUCOSE-CAPILLARY: 159 mg/dL — AB (ref 70–99)
GLUCOSE-CAPILLARY: 149 mg/dL — AB (ref 70–99)
Glucose-Capillary: 119 mg/dL — ABNORMAL HIGH (ref 70–99)

## 2014-03-04 LAB — CBC
HCT: 29.1 % — ABNORMAL LOW (ref 36.0–46.0)
Hemoglobin: 9.8 g/dL — ABNORMAL LOW (ref 12.0–15.0)
MCH: 27 pg (ref 26.0–34.0)
MCHC: 33.7 g/dL (ref 30.0–36.0)
MCV: 80.2 fL (ref 78.0–100.0)
PLATELETS: 131 10*3/uL — AB (ref 150–400)
RBC: 3.63 MIL/uL — ABNORMAL LOW (ref 3.87–5.11)
RDW: 14.6 % (ref 11.5–15.5)
WBC: 10.3 10*3/uL (ref 4.0–10.5)

## 2014-03-04 MED ORDER — METFORMIN HCL 500 MG PO TABS
500.0000 mg | ORAL_TABLET | Freq: Two times a day (BID) | ORAL | Status: DC
  Administered 2014-03-05 – 2014-03-06 (×3): 500 mg via ORAL
  Filled 2014-03-04 (×7): qty 1

## 2014-03-04 MED ORDER — ENOXAPARIN SODIUM 40 MG/0.4ML ~~LOC~~ SOLN
40.0000 mg | SUBCUTANEOUS | Status: DC
Start: 2014-03-04 — End: 2014-03-06
  Administered 2014-03-04 – 2014-03-05 (×2): 40 mg via SUBCUTANEOUS
  Filled 2014-03-04 (×3): qty 0.4

## 2014-03-04 MED ORDER — FUROSEMIDE 10 MG/ML IJ SOLN
40.0000 mg | Freq: Once | INTRAMUSCULAR | Status: AC
  Administered 2014-03-04: 40 mg via INTRAVENOUS
  Filled 2014-03-04: qty 4

## 2014-03-04 MED ORDER — LISINOPRIL 5 MG PO TABS
5.0000 mg | ORAL_TABLET | Freq: Every day | ORAL | Status: DC
  Administered 2014-03-04: 5 mg via ORAL
  Filled 2014-03-04 (×2): qty 1

## 2014-03-04 NOTE — Progress Notes (Signed)
Physical Therapy Treatment/ Discharge Patient Details Name: [name] MRN: [id number] DOB: 1950/07/03 Today's Date: 03/04/2014    History of Present Illness 64 year old woman with history of diabetes mellitus, hypertension, hyperlipidemia, GERD. Pt adm 02/22/14 due to SOB and new onset of LE edema.   2-D echocardiogram showed diffuse hypokinesis with LVEF of 25-30%. Pt underwent CABG on 03/01/14.    PT Comments    [name] is moving exceptionally well with assist only needed to clear legs onto bed and otherwise is maintaining transfers and gait without physical assist. Pt able to recall sternal precautions with prompting to remember arms overhead. Pt educated for HEP and activity progression with continued encouragement to walk with nursing but no further therapy needs at this time and will defer mobility to nursing/cardiac rehab.  Follow Up Recommendations  No PT follow up;Supervision - Intermittent, cardiac rehab     Equipment Recommendations  None recommended by PT    Recommendations for Other Services       Precautions / Restrictions Precautions Precautions: Sternal Precaution Comments: educated on sternal precautions     Mobility  Bed Mobility Overal bed mobility: Needs Assistance Bed Mobility: Sit to Sidelying;Sidelying to Sit   Sidelying to sit: Supervision     Sit to sidelying: Min assist General bed mobility comments: assist to bring legs onto surface with cues for sequence and no physical assist to get OOB  Transfers Overall transfer level: Modified independent   Transfers: Sit to/from Stand           General transfer comment: pt maintaining precautions with transfers  Ambulation/Gait Ambulation/Gait assistance: Supervision Ambulation Distance (Feet): 400 Feet Assistive device: Rolling walker (2 wheeled);None Gait Pattern/deviations: WFL(Within Functional Limits)   Gait velocity interpretation: Below normal speed for age/gender General Gait  Details: Pt initiated gait with Rw and then after stairs was able to walk without RW the remainder with no LOB and good stride length   Stairs Stairs: Yes Stairs assistance: Modified independent (Device/Increase time) Stair Management: One rail Right;Step to pattern Number of Stairs: 6    Wheelchair Mobility    Modified Rankin (Stroke Patients Only)       Balance Overall balance assessment: No apparent balance deficits (not formally assessed)                                  Cognition Arousal/Alertness: Awake/alert Behavior During Therapy: WFL for tasks assessed/performed Overall Cognitive Status: Within Functional Limits for tasks assessed                      Exercises General Exercises - Lower Extremity Long Arc Quad: AROM;Seated;Both;20 reps Hip ABduction/ADduction: AROM;Seated;Both;20 reps Hip Flexion/Marching: AROM;Seated;Both;20 reps    General Comments        Pertinent Vitals/Pain Pain Assessment: No/denies pain  HR 96-115 sats 99% on RA BP 147/53    Home Living                      Prior Function            PT Goals (current goals can now be found in the care plan section) Progress towards PT goals: Goals met/education completed, patient discharged from PT    Frequency       PT Plan Discharge plan needs to be updated    Co-evaluation             End  of Session   Activity Tolerance: Patient tolerated treatment well Patient left: in chair;with call bell/phone within reach;with nursing/sitter in room     Time: 5872-7618 PT Time Calculation (min) (ACUTE ONLY): 24 min  Charges:  $Gait Training: 8-22 mins $Therapeutic Exercise: 8-22 mins                    G Codes:      Melford Aase 2014/03/11, 12:21 PM Elwyn Reach, [place]

## 2014-03-04 NOTE — Op Note (Signed)
[name], [name] NO.:  [id number]  MEDICAL RECORD NO.:  [id number]  LOCATION:  2S09C                        FACILITY:  [facility]  PHYSICIAN:  Lanelle Bal, MD    DATE OF BIRTH:  1950-12-18  DATE OF PROCEDURE:  03/01/2014 DATE OF DISCHARGE:                              OPERATIVE REPORT   PREOPERATIVE DIAGNOSIS:  Severe 3-vessel coronary artery disease with severe LV dysfunction.  Ejection fraction 25-30%.  POSTOPERATIVE DIAGNOSIS:  Severe 3-vessel coronary artery disease with severe LV dysfunction.  Ejection fraction 25-30%.  SURGICAL PROCEDURE:  Coronary artery bypass grafting x5 with left internal mammary to the left anterior descending coronary artery sequential reverse saphenous vein graft to the first diagonal and second diagonal, reverse saphenous vein graft to the obtuse marginal, reverse saphenous vein graft to the posterior descending with right thigh and calf endo vein harvesting.  SURGEON:  Lanelle Bal, MD.  FIRST ASSISTANT:  Jadene Pierini, [place].  BRIEF HISTORY:  The patient is a 64 year old, diabetic female who a year and a half ago quit taking her diabetic medicine presented with congestive heart failure with sudden onset of shortness of breath while showering 5 days prior to cardiac catheterization.  She was admitted at that time hemoglobin A1c was 13.  Echocardiogram revealed moderate mitral regurgitation, LV dilatation, ejection fraction 25%.  After stabilization 5 days after admission, she underwent cardiac catheterization, which demonstrates severe three-vessel coronary artery disease and confirmed ejection fraction of 25-30%.  Because of the patient's severe three-vessel disease, coronary artery bypass grafting was recommended to the patient who agreed and signed informed consent.  DESCRIPTION OF PROCEDURE:  With Swan-Ganz and arterial line monitors in place, the patient underwent general endotracheal anesthesia.  It should be noted  her pulmonary artery pressures were elevated as high as 50/25. TEE was performed.  Findings are under separate note.  She had structurally intact mitral valve apparatus with mild to moderate mitral regurgitation initially with her legs elevated as her arterial pressure was normalized.  This improved.  After appropriate time-out, skin of the chest and legs was prepped with Betadine and draped in usual sterile manner.  Using the [employer] endo vein harvesting system, vein was harvested from the right thigh and calf and was of adequate quality and caliber.  Median sternotomy was performed.  Left internal mammary artery was dissected down as pedicle graft.  The distal artery was divided and had good free flow.  There was somewhat small vessel.  Pericardium was opened.  The patient was systemically heparinized.  Ascending aorta was cannulated.  The right atrium was cannulated and aortic root vent cardioplegia needle was introduced into the ascending aorta.  The patient was placed on cardiopulmonary bypass 2.4 L/min/m2.  Sites of anastomosis were selected and dissected out of the epicardium.  The patient's body temperature was cooled to 32 degrees.  Aortic crossclamp was applied and 500 mL cold blood potassium cardioplegia was administered with diastolic arrest of the heart.  Myocardial septal temperature was monitored throughout the crossclamp.  Attention was turned first to the posterior descending coronary artery which was totally occluded.  The vessel was opened.  It was small caliber admitted a 1-mm probe using a  running 8-0 Prolene.  Distal anastomosis was performed with a second reverse saphenous vein graft.  The heart was then elevated and the totally occluded circumflex.  A distal obtuse marginal branch was the largest in the distal circumflex system.  This vessel was opened and was also admitted a 1-mm probe approximately 1.2 mm in size.  Using a running 8-0 Prolene, distal anastomosis  was performed.  The distal vessel was small.  Additional cold blood cardioplegia administered down the vein grafts intermittently. Attention was then turned to the first and second diagonal and a side-to- side anastomosis was created with the 1st diagonal.  This vessel was 1.2 mm in size.  A side-to-side anastomosis was carried out with a running 8- 0 Prolene.  Distal extent of the same vein was then carried on to the second diagonal which was slightly smaller.  Distal anastomosis was completed with a running 8-0 Prolene.  The distal left anterior descending coronary artery was a moderate-sized vessel and very diffusely diseased.  An area of soft plaque was selected and the vessel was opened, admitted an 1.5-mm probe distally using a running 8-0 Prolene.  Left internal mammary artery was anastomosed to left anterior descending coronary artery.  There was rise in myocardial septal temperature with removing the bulldog on the mammary artery.  The bulldog was placed back on the mammary artery and we proceeded with 3 punch aortotomies in the ascending aorta and each of the 3 vein grafts were anastomosed to the ascending aorta with the aortic cross-clamp still in place.  The heart was allowed to passively fill and deaired. The bulldog was removed from the mammary artery when there was rise in myocardial septal temperature.  Aortic crossclamp was removed.  Total cross-clamp time of 130 minutes.  Sites of anastomosis were inspected and free of bleeding.  The patient was started on milrinone and dopamine infusion with body temperature rewarmed to 37 degrees.  Atrial and ventricular pacing wires were applied.  She was then ventilated and weaned from cardiopulmonary bypass without difficulty.  She remained hemodynamically stable.  She was decannulated in usual fashion. Protamine sulfate was administered with operative field hemostatic.  The left pleural tube and a Blake mediastinal drain were left  in place.  TEE showed some improvement and LV function specially the lateral wall.  The mitral regurgitation was trace to mild.  The sternum was then closed with #6 stainless steel wire.  Fascia was closed with interrupted 0 Vicryl, running 3-0 Vicryl in subcutaneous tissue, 4-0 subcuticular stitch in skin edges.  Dry dressings were applied.  The sponge count was correct.  Two 8.0 needles were missing.  A chest x-ray was performed at the completion of the procedure without any evidence of retained foreign material.  Total pump time was 161 minutes.  Because of low hematocrit prior to bypass, the patient was given 2 units of packed red blood cells.  She tolerated the procedure without obvious complication and was transferred to Surgical Intensive Care Unit for further postop care.     Lanelle Bal, MD     EG/MEDQ  D:  03/03/2014  T:  03/04/2014  Job:  [id number]

## 2014-03-04 NOTE — Progress Notes (Addendum)
      [address]       [place] [address]             [phone]        3 Days Post-Op Procedure(s) (LRB): CORONARY ARTERY BYPASS GRAFTING (CABG) x five, using left internal mammary artery and right leg greater saphenous vein harvested endoscopically (N/A) INTRAOPERATIVE TRANSESOPHAGEAL ECHOCARDIOGRAM (N/A)  Subjective: Patient sitting in chair awake and alert. No specific complaints. No bowel movement yet  Objective: Vital signs in last 24 hours: Temp:  [98.3 F (36.8 C)-99.3 F (37.4 C)] 99.3 F (37.4 C) (02/04 0424) Pulse Rate:  [82-111] 94 (02/04 0400) Cardiac Rhythm:  [-] Normal sinus rhythm (02/04 0400) Resp:  [14-58] 23 (02/04 0400) BP: (81-141)/(40-86) 136/84 mmHg (02/04 0400) SpO2:  [92 %-100 %] 93 % (02/04 0400) Weight:  [198 lb 12.8 oz (90.175 kg)] 198 lb 12.8 oz (90.175 kg) (02/04 0400)  Pre op weight 84 kg Current Weight  03/04/14 198 lb 12.8 oz (90.175 kg)      Intake/Output from previous day: 02/03 0701 - 02/04 0700 In: 300 [P.O.:240; I.V.:60] Out: 485 [Urine:485]   Physical Exam:  Cardiovascular: RRR Pulmonary:Diminished at right base; no rales, wheezes, or rhonchi. Abdomen: Soft, non tender, sporadic bowel sounds present. Extremities: Mild bilateral lower extremity edema. Wounds: Clean and dry.    Lab Results: CBC:  Recent Labs  03/03/14 0500 03/04/14 0329  WBC 9.8 10.3  HGB 9.8* 9.8*  HCT 28.8* 29.1*  PLT 102* 131*   BMET:   Recent Labs  03/03/14 0500 03/04/14 0329  NA 138 138  K 4.0 5.0  CL 105 106  CO2 29 26  GLUCOSE 137* 110*  BUN 14 19  CREATININE 1.02 1.16*  CALCIUM 7.9* 8.2*    PT/INR:  Lab Results  Component Value Date   INR 1.22 03/01/2014   INR 1.01 03/01/2014   INR 1.00 02/26/2014   ABG:  INR: Will add last result for INR, ABG once components are confirmed Will add last 4 CBG results once components are confirmed  Assessment/Plan:  1. CV - ST. On  Coreg 6.125 bid and Lisinopril 5  daily. 2.  Pulmonary - On room air. CXR appears to show trace left apical pneumothorax, right pleural effusion and atelectasis.Encourage incentive spirometer 3. Volume Overload -  Give Lasix 40 IV this am 4.  Acute blood loss anemia - H and H 9.8 and 29.1. Continue oral iron and folic acid 5. Thrombocytopenia-platelets stable at 131,000 6. DM-CBGs 83/106/149. On  Metformin as taken pre op, but will decrease as oral intake "so so" and want to avoid hypoglycemia. Pre op HGA1C 13.1. Will need close follow up with medical doctor after discharge. 7. Potassium up to 5-no further supplementation 8. Transfer to [place] [facility] 03/04/2014,7:46 AM  Waiting for transfer to step down I have seen and examined [name] and agree with the above assessment  and plan.  Grace Isaac MD Beeper [phone] Office [phone] 03/04/2014 8:06 AM

## 2014-03-04 NOTE — Progress Notes (Signed)
Patient transferred with belongings, medications, and chart to room [address] with nurse. Attached to monitor and new nurse at bedside. No new problems at this time. Family notified via phone. [name]

## 2014-03-05 ENCOUNTER — Inpatient Hospital Stay (HOSPITAL_COMMUNITY): Payer: BC Managed Care – PPO

## 2014-03-05 LAB — CLOSTRIDIUM DIFFICILE BY PCR: Toxigenic C. Difficile by PCR: NEGATIVE

## 2014-03-05 LAB — BASIC METABOLIC PANEL
ANION GAP: 6 (ref 5–15)
BUN: 21 mg/dL (ref 6–23)
CALCIUM: 8 mg/dL — AB (ref 8.4–10.5)
CHLORIDE: 104 mmol/L (ref 96–112)
CO2: 28 mmol/L (ref 19–32)
CREATININE: 1.03 mg/dL (ref 0.50–1.10)
GFR calc Af Amer: 66 mL/min — ABNORMAL LOW (ref >90)
GFR, EST NON AFRICAN AMERICAN: 57 mL/min — AB (ref >90)
Glucose, Bld: 107 mg/dL — ABNORMAL HIGH (ref 70–99)
Potassium: 4.2 mmol/L (ref 3.5–5.1)
SODIUM: 138 mmol/L (ref 135–145)

## 2014-03-05 LAB — GLUCOSE, CAPILLARY
Glucose-Capillary: 106 mg/dL — ABNORMAL HIGH (ref 70–99)
Glucose-Capillary: 126 mg/dL — ABNORMAL HIGH (ref 70–99)
Glucose-Capillary: 133 mg/dL — ABNORMAL HIGH (ref 70–99)
Glucose-Capillary: 165 mg/dL — ABNORMAL HIGH (ref 70–99)

## 2014-03-05 MED ORDER — LISINOPRIL 10 MG PO TABS
10.0000 mg | ORAL_TABLET | Freq: Every day | ORAL | Status: DC
  Administered 2014-03-05 – 2014-03-06 (×2): 10 mg via ORAL
  Filled 2014-03-05 (×2): qty 1

## 2014-03-05 NOTE — Progress Notes (Addendum)
[address]       [place] [address]             [phone]      4 Days Post-Op Procedure(s) (LRB): CORONARY ARTERY BYPASS GRAFTING (CABG) x five, using left internal mammary artery and right leg greater saphenous vein harvested endoscopically (N/A) INTRAOPERATIVE TRANSESOPHAGEAL ECHOCARDIOGRAM (N/A) Subjective: Looks and feels well, no specific complaint  Objective: Vital signs in last 24 hours: Temp:  [97.4 F (36.3 C)-98.3 F (36.8 C)] 98.3 F (36.8 C) (02/05 0431) Pulse Rate:  [80-102] 88 (02/05 0431) Cardiac Rhythm:  [-] Normal sinus rhythm (02/04 1920) Resp:  [15-35] 18 (02/05 0431) BP: (114-151)/(66-94) 138/67 mmHg (02/05 0431) SpO2:  [92 %-97 %] 94 % (02/05 0431) Weight:  [198 lb 13.7 oz (90.2 kg)] 198 lb 13.7 oz (90.2 kg) (02/05 0431)  Hemodynamic parameters for last 24 hours:    Intake/Output from previous day: 02/04 0701 - 02/05 0700 In: 220 [P.O.:220] Out: 1000 [Urine:1000] Intake/Output this shift:    General appearance: alert, cooperative and no distress Heart: regular rate and rhythm Lungs: dim right>left base Abdomen: benign Extremities: + LE edema  Wound: incis healing well  Lab Results:  Recent Labs  03/03/14 0500 03/04/14 0329  WBC 9.8 10.3  HGB 9.8* 9.8*  HCT 28.8* 29.1*  PLT 102* 131*   BMET:  Recent Labs  03/04/14 0329 03/05/14 0524  NA 138 138  K 5.0 4.2  CL 106 104  CO2 26 28  GLUCOSE 110* 107*  BUN 19 21  CREATININE 1.16* 1.03  CALCIUM 8.2* 8.0*    PT/INR: No results for input(s): LABPROT, INR in the last 72 hours. ABG    Component Value Date/Time   PHART 7.353 03/02/2014 0252   HCO3 24.3* 03/02/2014 0252   TCO2 22 03/02/2014 1724   ACIDBASEDEF 1.0 03/02/2014 0252   O2SAT 98.0 03/02/2014 0252   CBG (last 3)   Recent Labs  03/04/14 1605 03/04/14 2149 03/05/14 0614  GLUCAP 159* 149* 106*    Meds Scheduled Meds: . acetaminophen  1,000 mg Oral 4 times per day   Or  .  acetaminophen (TYLENOL) oral liquid 160 mg/5 mL  1,000 mg Per Tube 4 times per day  . aspirin EC  325 mg Oral Daily   Or  . aspirin  324 mg Per Tube Daily  . atorvastatin  80 mg Oral q1800  . bisacodyl  10 mg Oral Daily   Or  . bisacodyl  10 mg Rectal Daily  . carvedilol  6.25 mg Oral BID WC  . docusate sodium  200 mg Oral Daily  . enoxaparin (LOVENOX) injection  40 mg Subcutaneous Q24H  . ferrous sulfate  325 mg Oral Q breakfast  . folic acid  1 mg Oral Daily  . insulin aspart  0-24 Units Subcutaneous TID AC & HS  . lisinopril  5 mg Oral Daily  . metFORMIN  500 mg Oral BID WC  . pantoprazole  40 mg Oral Daily  . sodium chloride  3 mL Intravenous Q12H  . sodium chloride  3 mL Intravenous Q12H   Continuous Infusions: . sodium chloride Stopped (03/02/14 1400)  . sodium chloride 250 mL (03/01/14 1500)  . lactated ringers Stopped (03/03/14 1200)   PRN Meds:.sodium chloride, metoprolol, morphine injection, ondansetron (ZOFRAN) IV, oxyCODONE, sodium chloride, sodium chloride, traMADol  Xrays Dg Chest 2 View  03/05/2014   CLINICAL DATA:  64 year old female status post CABG. Pleural effusion.  Initial encounter.  EXAM: CHEST  2 VIEW  COMPARISON:  03/04/2014 and earlier.  FINDINGS: Epicardial pacer wires remain. Stable cardiac size and mediastinal contours. Stable sequelae of CABG. Mildly larger lung volumes. No pneumothorax or pulmonary edema. Right greater than left pleural effusions and pulmonary atelectasis. No areas of worsening ventilation. No acute osseous abnormality identified.  IMPRESSION: Right greater than left pleural effusions and pulmonary atelectasis with mildly improved lung volumes.   Electronically Signed   By: Lars Pinks M.D.   On: 03/05/2014 07:08   Dg Chest 2 View  03/04/2014   CLINICAL DATA:  CABG.  EXAM: CHEST  2 VIEW  COMPARISON:  03/03/2014.  FINDINGS: Interval removal of right IJ sheath. Mediastinum and hilar structures normal. Heart size stable. Prior CABG. Pulmonary  vascularity normal. Persistent but partially clearing bibasilar atelectasis and/or infiltrates. Small right effusion. Previously identified tiny left apical pneumothorax not definitely identified on today's exam. Mild pleural thickening in the left apex consistent with scarring.  IMPRESSION: 1. Prior CABG.  Heart size stable. 2. Persistent but partially clearing bibasilar atelectasis. Small right pleural effusion. 3. Previously identified tiny left apical pneumothorax not identified on today's exam. Mild left apical pleural parenchymal scarring .   Electronically Signed   By: Marcello Moores  Register   On: 03/04/2014 07:59    Assessment/Plan: S/P Procedure(s) (LRB): CORONARY ARTERY BYPASS GRAFTING (CABG) x five, using left internal mammary artery and right leg greater saphenous vein harvested endoscopically (N/A) INTRAOPERATIVE TRANSESOPHAGEAL ECHOCARDIOGRAM (N/A)  1 doing well 2 increase lisinopril a bit for HTN 3 needs aggressive pulm toilet for ATX 4 labs ok 5 sugars ok 6 sugars adeq controlled 7 d/c wires  LOS: 11 days    GOLD,WAYNE E 03/05/2014  Progressing well Likely d/c tomorrow  I have seen and examined Renea Ee and agree with the above assessment  and plan.  Grace Isaac MD Beeper [phone] Office [phone] 03/05/2014 4:35 PM

## 2014-03-05 NOTE — Accreditation Note (Signed)
dc'ed pacing wires pt. tolerated well 

## 2014-03-05 NOTE — Progress Notes (Signed)
CARDIAC REHAB PHASE I   PRE:  Rate/Rhythm: 87 SR  BP:  Supine:   Sitting: 111/58  Standing:    SaO2: 93%RA  MODE:  Ambulation: 550 ft   POST:  Rate/Rhythm: 103 ST  BP:  Supine:   Sitting: 125/58  Standing:    SaO2: 90-91%RA 1350-1428 Pt walked 550 ft on RA with rolling walker and asst x 1 with steady gait. Pt did not feel that she needed walker for home. Tolerated well. Encouraged IS and 2 more walks today. To recliner with call bell.   Graylon Good, RN BSN  03/05/2014 2:24 PM

## 2014-03-05 NOTE — Discharge Summary (Signed)
[address]       ,[place] [address]             [phone]              Discharge Summary  Name: [name] DOB: 03/14/1950 64 y.o. MRN: [id number]   Admission Date: 02/22/2014 Discharge Date: 03/06/2014    Admitting Diagnosis: Shortness of breath   Discharge Diagnosis:  Acute systolic congestive heart failure Severe 3 vessel coronary artery disease Severe left ventricular dysfunction Expected postop blood loss anemia  Past Medical History  Diagnosis Date  . Diabetes mellitus 1998    Dx in 1998. Microalbuminuria, never on insulin.  Marland Kitchen GERD (gastroesophageal reflux disease)   . Hyperlipidemia   . Hypertension   . History of blurry vision 06/09    Hospitalized for this  . Personal history of colonic adenomas 10/07/2007  . Pneumonia 01/2014      Procedures: CORONARY ARTERY BYPASS GRAFTING x 5  - 03/01/2014  Left internal mammary artery to left anterior descending  Sequential saphenous vein graft to first and second diagonals  Saphenous vein graft to obtuse marginal  Saphenous vein graft to posterior descending ENDOSCOPIC VEIN HARVEST RIGHT LEG    HPI:  The patient is a 64 y.o. female with a history of hypertension, diabetes, hyperlipidemia, but self discontinued all her medications over a year ago. She has not been seen by her primary MD since 2014. She has been in her usual state of health until the past 3 weeks, when she began to develop dyspnea, which has significantly worsened over the past week. Her symptoms initially occurred with activity, and she has had to decrease her activity and slow her walking to compensate. She also reports orthopnea and PND, but denies any history of chest pain. She has a history of chronic lower extremity edema, but reports worsening over the past 2-3 months. She has had subjective fevers and nonproductive cough as well. 3 days ago, she was in the shower when she became significantly dyspneic to  the point that she felt she was going to smother. She called out to her daughter, with whom she lives, and her daughter brought her to the ER at [facility] for further evaluation.   In the ER, she was given a fluid bolus and shortly thereafter developed respiratory distress. She underwent a CTA which was negative for pulmonary embolus, but showed multifocal patchy infiltrates concerning for CAP, as well as large bilateral effusions concerning for new acute systolic CHF. EKG was unremarkable. She was admitted for further evaluation and treatment.   Hospital Course:  The patient was admitted to [facility] on 02/22/2014. A 2D echocardiogram was performed which revealed EF 25-30%, a small pericardial effusion and a left pleural effusion, with no valvular abnormalities. Cardiology was consulted and she was aggressively diuresed. Cardiac catheterization was recommended, and this was performed on 02/26/2014. This revealed severe 3 vessel coronary artery disease with EF 30-35%. There was total occlusion of the proximal to mid circumflex, total occlusion of the mid RCA, high grade proximal second diagonal disease, and high grade multifocal obstruction to the proximal and mid LAD, which supplied collaterals to the circumflex and RCA. TCTS was consulted for consideration of surgical revascularization.  Dr. Servando Snare saw the patient and reviewed her studies and agreed with the need for CABG. All risks, benefits and alternatives of surgery were explained in detail, and the patient agreed to proceed. The patient was taken to  the operating room and underwent the above procedure.    The postoperative course has been notable for expected postop blood loss anemia, which has been treated with iron and folate and has not required transfusion.  The patient has been started on a beta blocker, statin, ACE-I and aspirin.  She is maintaining sinus rhythm and blood pressures have generally been stable.  Blood sugars have been  controlled on oral medications (preop A1C=13.1).  She is hypervolemic and will be discharged home with a short course of Lasix.  Incisions are healing well.  She is ambulating in the hall without difficulty and is tolerating a diet.  Overall, the patient is progressing well. She has been evaluated on today's date and is medically stable for discharge home.    Recent vital signs:  Filed Vitals:   03/06/14 0500  BP: 131/59  Pulse: 91  Temp: 98.2 F (36.8 C)  Resp: 18    Recent laboratory studies:  CBC:  Recent Labs  03/04/14 0329  WBC 10.3  HGB 9.8*  HCT 29.1*  PLT 131*   BMET:   Recent Labs  03/04/14 0329 03/05/14 0524  NA 138 138  K 5.0 4.2  CL 106 104  CO2 26 28  GLUCOSE 110* 107*  BUN 19 21  CREATININE 1.16* 1.03  CALCIUM 8.2* 8.0*    PT/INR: No results for input(s): LABPROT, INR in the last 72 hours.   Discharge Medications:     Medication List    TAKE these medications        aspirin 325 MG EC tablet  Take 1 tablet (325 mg total) by mouth daily.     carvedilol 6.25 MG tablet  Commonly known as:  COREG  Take 1 tablet (6.25 mg total) by mouth 2 (two) times daily with a meal.     ferrous sulfate 325 (65 FE) MG tablet  Take 1 tablet (325 mg total) by mouth daily with breakfast.     folic acid 1 MG tablet  Commonly known as:  FOLVITE  Take 1 tablet (1 mg total) by mouth daily.     furosemide 40 MG tablet  Commonly known as:  LASIX  Take 1 tablet (40 mg total) by mouth daily. For 2 weeks     glucose blood test strip  Commonly known as:  RELION ULTIMA TEST  Test your sugar once daily before your meal x 4 days, then test 4 times daily before meals x 4 days     glucose blood test strip  Commonly known as:  ONE TOUCH ULTRA TEST  Check twice a day before giving insulin dx code 250.00     insulin NPH-regular Human (70-30) 100 UNIT/ML injection  Commonly known as:  NOVOLIN 70/30  - Please inject 22units into skin 30 minutes before breakfast  -  Then inject 20 units into skin 30 minutes before dinner     Insulin Syringe-Needle U-100 31G X 15/64" 0.3 ML Misc  Use to inject insulin twice daily as directed dx code 250.00     lisinopril 10 MG tablet  Commonly known as:  PRINIVIL,ZESTRIL  Take 1 tablet (10 mg total) by mouth daily.     metFORMIN 1000 MG tablet  Commonly known as:  GLUCOPHAGE  Take 1 tablet (1,000 mg total) by mouth 2 (two) times daily with a meal.     ONETOUCH DELICA LANCETS FINE Misc  Check twice a day before giving insulin dx code 250.00     oxyCODONE 5 MG  immediate release tablet  Commonly known as:  Oxy IR/ROXICODONE  Take 1-2 tablets (5-10 mg total) by mouth every 3 (three) hours as needed for severe pain.     Potassium Chloride ER 20 MEQ Tbcr  Take 20 mEq by mouth daily. For 14 days     simvastatin 20 MG tablet  Commonly known as:  ZOCOR  Take 1 tablet (20 mg total) by mouth at bedtime.         Discharge Instructions:  The patient is to refrain from driving, heavy lifting or strenuous activity.  May shower daily and clean incisions with soap and water.  May resume regular diet.   Follow Up:    Follow-up Information    Follow up with Grace Isaac, MD.   Specialty:  Cardiothoracic Surgery   Why:  Office will contact you with an appointment   Contact information:   [address] [place] [address] [phone]       Follow up with Tarri Fuller, PA-C On 03/23/2014.   Specialty:  Physician Assistant   Why:  Tarri Fuller, PA-C on 03/23/14 @9 :30am ([facility])    Contact information:   [place] [address] [phone]       The patient has been discharged on:  1.Beta Blocker: Yes [ x ]  No [ ]   If No, reason:    2.Ace Inhibitor/ARB: Yes [ x ]  No [  ]  If No, reason:    3.Statin: Yes [ x ]  No [ ]   If No, reason:    4.Ecasa: Yes [ x ]  No [ ]   If No, reason:  Follow-up Information    Follow up with Grace Isaac,  MD.   Specialty:  Cardiothoracic Surgery   Why:  Office will contact you with an appointment   Contact information:   [address] [place] [address] [phone]       Follow up with Tarri Fuller, PA-C On 03/23/2014.   Specialty:  Physician Assistant   Why:  Tarri Fuller, PA-C on 03/23/14 @9 :30am ([facility])    Contact information:   [place] [address] [phone]        Aissa Lisowski, [place] 03/06/2014, 7:57 AM

## 2014-03-06 LAB — GLUCOSE, CAPILLARY
Glucose-Capillary: 68 mg/dL — ABNORMAL LOW (ref 70–99)
Glucose-Capillary: 101 mg/dL — ABNORMAL HIGH (ref 70–99)

## 2014-03-06 MED ORDER — FERROUS SULFATE 325 (65 FE) MG PO TABS: 325.0000 mg | ORAL_TABLET | Freq: Every day | ORAL | Status: DC

## 2014-03-06 MED ORDER — POTASSIUM CHLORIDE ER 20 MEQ PO TBCR: 20.0000 meq | EXTENDED_RELEASE_TABLET | Freq: Every day | ORAL | Status: DC

## 2014-03-06 MED ORDER — FUROSEMIDE 40 MG PO TABS: 40.0000 mg | ORAL_TABLET | Freq: Every day | ORAL | Status: DC

## 2014-03-06 MED ORDER — FOLIC ACID 1 MG PO TABS: 1.0000 mg | ORAL_TABLET | Freq: Every day | ORAL | Status: DC

## 2014-03-06 MED ORDER — SIMVASTATIN 20 MG PO TABS: 20.0000 mg | ORAL_TABLET | Freq: Every day | ORAL | Status: DC

## 2014-03-06 MED ORDER — LISINOPRIL 10 MG PO TABS: 10.0000 mg | ORAL_TABLET | Freq: Every day | ORAL | Status: DC

## 2014-03-06 MED ORDER — OXYCODONE HCL 5 MG PO TABS: 5.0000 mg | ORAL_TABLET | ORAL | Status: DC | PRN

## 2014-03-06 MED ORDER — CARVEDILOL 6.25 MG PO TABS: 6.2500 mg | ORAL_TABLET | Freq: Two times a day (BID) | ORAL | Status: DC

## 2014-03-06 MED ORDER — ASPIRIN 325 MG PO TBEC: 325.0000 mg | DELAYED_RELEASE_TABLET | Freq: Every day | ORAL | Status: DC

## 2014-03-06 MED ORDER — METFORMIN HCL 1000 MG PO TABS: 1000.0000 mg | ORAL_TABLET | Freq: Two times a day (BID) | ORAL | Status: DC

## 2014-03-06 NOTE — Progress Notes (Signed)
CARDIAC REHAB PHASE I   PRE:  Rate/Rhythm: 97 sinus  BP:  Supine:   Sitting: 126/69  Standing:    SaO2: 96% RA  MODE:  Ambulation: 550 ft   POST:  Rate/Rhythem: 115 sinus tach  BP:  Supine:   Sitting: 133/70  Standing:    SaO2: 96 % RA  Pt ambulated 550 ft with assist x1.  Pt took one standing rest break but had no complaints during walk.  Pt returned to bed after walk with VSS and call bell in reach.  Reviewed pt d/c education: restrictions, risk factors, sternal precautions, ISP use, diet, and exercise.  Also discussed Cardiac Rehab Phase II with request to have info sent to [facility].  Pt voiced understanding. Alberteen Sam, [place], ACSM RCEP [phone]  Clotilde Dieter

## 2014-03-06 NOTE — Progress Notes (Signed)
Hypoglycemic Event  CBG: 68  Treatment:1 cup orange juice  Symptoms:none  Follow-up CBG: [id number] CBG Result:101  Possible Reasons for Event:poor appetite  Comments/MD notified:no    Netta Corrigan, Slayter [place]house [place]  Remember to initiate Hypoglycemia Order Set & complete

## 2014-03-06 NOTE — Progress Notes (Signed)
Ambulated with nurse 550 feet using front wheel walker on room air,tolerated well.O2 saturation post ambulation is 94%,VS stable,no complaint of shortness of breath.Back to bed call button within reach.

## 2014-03-06 NOTE — Discharge Instructions (Signed)
Coronary Artery Bypass Grafting, Care After °Refer to this sheet in the next few weeks. These instructions provide you with information on caring for yourself after your procedure. Your health care provider may also give you more specific instructions. Your treatment has been planned according to current medical practices, but problems sometimes occur. Call your health care provider if you have any problems or questions after your procedure. °WHAT TO EXPECT AFTER THE PROCEDURE °Recovery from surgery will be different for everyone. Some people feel well after 3 or 4 weeks, while for others it takes longer. After your procedure, it is typical to have the following: °· Nausea and a lack of appetite.   °· Constipation. °· Weakness and fatigue.   °· Depression or irritability.   °· Pain or discomfort at your incision site. °HOME CARE INSTRUCTIONS °· Take medicines only as directed by your health care provider. Do not stop taking medicines or start any new medicines without first checking with your health care provider. °· Take your pulse as directed by your health care provider. °· Perform deep breathing as directed by your health care provider. If you were given a device called an incentive spirometer, use it to practice deep breathing several times a day. Support your chest with a pillow or your arms when you take deep breaths or cough. °· Keep incision areas clean, dry, and protected. Remove or change any bandages (dressings) only as directed by your health care provider. You may have skin adhesive strips over the incision areas. Do not take the strips off. They will fall off on their own. °· Check incision areas daily for any swelling, redness, or drainage. °· If incisions were made in your legs, do the following: °¨ Avoid crossing your legs.   °¨ Avoid sitting for long periods of time. Change positions every 30 minutes.   °¨ Elevate your legs when you are sitting. °· Wear compression stockings as directed by your  health care provider. These stockings help keep blood clots from forming in your legs. °· Take showers once your health care provider approves. Until then, only take sponge baths. Pat incisions dry. Do not rub incisions with a washcloth or towel. Do not take baths, swim, or use a hot tub until your health care provider approves. °· Eat foods that are high in fiber, such as raw fruits and vegetables, whole grains, beans, and nuts. Meats should be lean cut. Avoid canned, processed, and fried foods. °· Drink enough fluid to keep your urine clear or pale yellow. °· Weigh yourself every day. This helps identify if you are retaining fluid that may make your heart and lungs work harder. °· Rest and limit activity as directed by your health care provider. You may be instructed to: °¨ Stop any activity at once if you have chest pain, shortness of breath, irregular heartbeats, or dizziness. Get help right away if you have any of these symptoms. °¨ Move around frequently for short periods or take short walks as directed by your health care provider. Increase your activities gradually. You may need physical therapy or cardiac rehabilitation to help strengthen your muscles and build your endurance. °¨ Avoid lifting, pushing, or pulling anything heavier than 10 lb (4.5 kg) for at least 6 weeks after surgery. °· Do not drive until your health care provider approves.  °· Ask your health care provider when you may return to work. °· Ask your health care provider when you may resume sexual activity. °· Keep all follow-up visits as directed by your health care   provider. This is important. °SEEK MEDICAL CARE IF: °· You have swelling, redness, increasing pain, or drainage at the site of an incision. °· You have a fever. °· You have swelling in your ankles or legs. °· You have pain in your legs.   °· You gain 2 or more pounds (0.9 kg) a day. °· You are nauseous or vomit. °· You have diarrhea.  °SEEK IMMEDIATE MEDICAL CARE IF: °· You have  chest pain that goes to your jaw or arms. °· You have shortness of breath.   °· You have a fast or irregular heartbeat.   °· You notice a "clicking" in your breastbone (sternum) when you move.   °· You have numbness or weakness in your arms or legs. °· You feel dizzy or light-headed.   °MAKE SURE YOU: °· Understand these instructions. °· Will watch your condition. °· Will get help right away if you are not doing well or get worse. °Document Released: 08/04/2004 Document Revised: 06/01/2013 Document Reviewed: 06/24/2012 °[employer]® Patient Information ©2015 [employer]. This information is not intended to replace advice given to you by your health care provider. Make sure you discuss any questions you have with your health care provider. ° °Endoscopic Saphenous Vein Harvesting °Care After °Refer to this sheet in the next few weeks. These instructions provide you with information on caring for yourself after your procedure. Your health care provider may also give you more specific instructions. Your treatment has been planned according to current medical practices, but problems sometimes occur. Call your health care provider if you have any problems or questions after your procedure. °HOME CARE INSTRUCTIONS °Medicine °· Take whatever pain medicine your surgeon prescribes. Follow the directions carefully. Do not take over-the-counter pain medicine unless your surgeon says it is okay. Some pain medicine can cause bleeding problems for several weeks after surgery. °· Follow your surgeon's instructions about driving. You will probably not be permitted to drive after heart surgery. °· Take any medicines your surgeon prescribes. Any medicines you took before your heart surgery should be checked with your health care provider before you start taking them again. °Wound care °· If your surgeon has prescribed an elastic bandage or stocking, ask how long you should wear it. °· Check the area around your surgical cuts  (incisions) whenever your bandages (dressings) are changed. Look for any redness or swelling. °· You will need to return to have the stitches (sutures) or staples taken out. Ask your surgeon when to do that. °· Ask your surgeon when you can shower or bathe. °Activity °· Try to keep your legs raised when you are sitting. °· Do any exercises your health care providers have given you. These may include deep breathing exercises, coughing, walking, or other exercises. °SEEK MEDICAL CARE IF: °· You have any questions about your medicines. °· You have more leg pain, especially if your pain medicine stops working. °· New or growing bruises develop on your leg. °· Your leg swells, feels tight, or becomes red. °· You have numbness in your leg. °SEEK IMMEDIATE MEDICAL CARE IF: °· Your pain gets much worse. °· Blood or fluid leaks from any of the incisions. °· Your incisions become warm, swollen, or red. °· You have chest pain. °· You have trouble breathing. °· You have a fever. °· You have more pain near your leg incision. °MAKE SURE YOU: °· Understand these instructions. °· Will watch your condition. °· Will get help right away if you are not doing well or get worse. °Document Released: 09/27/2010   Document Revised: 01/20/2013 Document Reviewed: 09/27/2010 °[employer]® Patient Information ©2015 [employer]. This information is not intended to replace advice given to you by your health care provider. Make sure you discuss any questions you have with your health care provider. ° ° °

## 2014-03-06 NOTE — Progress Notes (Addendum)
      [address]       [place] [address]             [phone]       5 Days Post-Op Procedure(s) (LRB): CORONARY ARTERY BYPASS GRAFTING (CABG) x five, using left internal mammary artery and right leg greater saphenous vein harvested endoscopically (N/A) INTRAOPERATIVE TRANSESOPHAGEAL ECHOCARDIOGRAM (N/A)   Subjective:  [name] has no complaints this morning.  She states she feels great and is ready to go home.  + ambulation +BM  Objective: Vital signs in last 24 hours: Temp:  [97.4 F (36.3 C)-98.6 F (37 C)] 98.2 F (36.8 C) (02/06 0500) Pulse Rate:  [82-91] 91 (02/06 0500) Cardiac Rhythm:  [-] Normal sinus rhythm (02/05 1920) Resp:  [18-20] 18 (02/06 0500) BP: (117-134)/(58-80) 131/59 mmHg (02/06 0500) SpO2:  [96 %-98 %] 96 % (02/06 0500) Weight:  [198 lb 6.6 oz (90 kg)] 198 lb 6.6 oz (90 kg) (02/06 0500)  Intake/Output from previous day: 02/05 0701 - 02/06 0700 In: 480 [P.O.:480] Out: -   General appearance: alert, cooperative and no distress Heart: regular rate and rhythm Lungs: clear to auscultation bilaterally Abdomen: soft, non-tender; bowel sounds normal; no masses,  no organomegaly Extremities: edema 1+ Wound: clean and dry  Lab Results:  Recent Labs  03/04/14 0329  WBC 10.3  HGB 9.8*  HCT 29.1*  PLT 131*   BMET:  Recent Labs  03/04/14 0329 03/05/14 0524  NA 138 138  K 5.0 4.2  CL 106 104  CO2 26 28  GLUCOSE 110* 107*  BUN 19 21  CREATININE 1.16* 1.03  CALCIUM 8.2* 8.0*    PT/INR: No results for input(s): LABPROT, INR in the last 72 hours. ABG    Component Value Date/Time   PHART 7.353 03/02/2014 0252   HCO3 24.3* 03/02/2014 0252   TCO2 22 03/02/2014 1724   ACIDBASEDEF 1.0 03/02/2014 0252   O2SAT 98.0 03/02/2014 0252   CBG (last 3)   Recent Labs  03/05/14 1603 03/05/14 2141 03/06/14 0622  GLUCAP 133* 165* 53*    Assessment/Plan: S/P Procedure(s) (LRB): CORONARY ARTERY BYPASS GRAFTING (CABG) x  five, using left internal mammary artery and right leg greater saphenous vein harvested endoscopically (N/A) INTRAOPERATIVE TRANSESOPHAGEAL ECHOCARDIOGRAM (N/A)  1. Cv- NSR, HTN improved- continue Coreg, Lisinopril 2. Pulm- off oxygen, continue IS 3. Renal- + hypervolemia, will continue Lasix today 4. DM- sugars have been controlled during hospitalization, episode of hypoglycemia which patient attributed to not eating much 5. Dispo- patient doing very well, will d/c home today   LOS: 12 days    Silva, Julia 03/06/2014  Seen and examined, agree with above Home today

## 2014-03-07 NOTE — Progress Notes (Signed)
Discharged to home with family office visits in place teaching done  

## 2014-03-23 ENCOUNTER — Ambulatory Visit (INDEPENDENT_AMBULATORY_CARE_PROVIDER_SITE_OTHER): Payer: BC Managed Care – PPO | Admitting: Physician Assistant

## 2014-03-23 ENCOUNTER — Other Ambulatory Visit: Payer: Self-pay | Admitting: Physician Assistant

## 2014-03-23 ENCOUNTER — Encounter: Payer: Self-pay | Admitting: Physician Assistant

## 2014-03-23 VITALS — BP 120/52 | HR 105 | Ht 62.0 in | Wt 171.9 lb

## 2014-03-23 DIAGNOSIS — I2510 Atherosclerotic heart disease of native coronary artery without angina pectoris: Secondary | ICD-10-CM

## 2014-03-23 DIAGNOSIS — E118 Type 2 diabetes mellitus with unspecified complications: Secondary | ICD-10-CM

## 2014-03-23 DIAGNOSIS — I255 Ischemic cardiomyopathy: Secondary | ICD-10-CM | POA: Insufficient documentation

## 2014-03-23 DIAGNOSIS — E785 Hyperlipidemia, unspecified: Secondary | ICD-10-CM

## 2014-03-23 DIAGNOSIS — Z951 Presence of aortocoronary bypass graft: Secondary | ICD-10-CM

## 2014-03-23 DIAGNOSIS — E1165 Type 2 diabetes mellitus with hyperglycemia: Secondary | ICD-10-CM

## 2014-03-23 DIAGNOSIS — IMO0002 Reserved for concepts with insufficient information to code with codable children: Secondary | ICD-10-CM

## 2014-03-23 HISTORY — DX: Ischemic cardiomyopathy: I25.5

## 2014-03-23 MED ORDER — CARVEDILOL 6.25 MG PO TABS: 9.3750 mg | ORAL_TABLET | Freq: Two times a day (BID) | ORAL | Status: DC

## 2014-03-23 NOTE — Patient Instructions (Addendum)
1.  Blood pressure range:  110-130/60-80. 2.  Increase Coreg to 9.375mg  daily.  Tarri Fuller, PA-C, recommends that you schedule a follow-up appointment in 3 months with Dr Stanford Breed.

## 2014-03-23 NOTE — Assessment & Plan Note (Signed)
She is scheduled to see her primary care provider on March 10. Currently on metformin only

## 2014-03-23 NOTE — Assessment & Plan Note (Signed)
Continue statin.  We should check her LFTs in 8 weeks.  Recheck lipids in 3 months.    Lipid Panel     Component Value Date/Time   CHOL 255* 02/22/2014 1046   TRIG 94 02/22/2014 1046   HDL 56 02/22/2014 1046   CHOLHDL 4.6 02/22/2014 1046   VLDL 19 02/22/2014 1046   LDLCALC 180* 02/22/2014 1046

## 2014-03-23 NOTE — Assessment & Plan Note (Addendum)
Patient appears euvolemic. She's lost 27 pounds according to her last weight in the hospital and today's weight. Continue without any diuretic. Consider rechecking 2-D echocardiogram in 3 months.  She is on ACE inhibitor.

## 2014-03-23 NOTE — Progress Notes (Signed)
Patient ID: [name], female   DOB: 03-18-1950, 64 y.o.   MRN: [id number]    Date:  03/23/2014   ID:  [name], DOB 10-Jul-1950, MRN [id number]  PCP:  Jenetta Downer, MD  Primary Cardiologist:  Stanford Breed  Chief Complaint  Patient presents with  . Follow-up    s/p CABG 3 weeks ago. patient reports no complaints.     History of Present Illness: [name] is a 64 y.o. female with a history of hypertension, diabetes, hyperlipidemia, but self discontinued all her medications over a year ago.    She was admitted to [facility] on 02/22/2014. A 2D echocardiogram was performed which revealed EF 25-30%, a small pericardial effusion and a left pleural effusion, with no valvular abnormalities. Cardiology was consulted and she was aggressively diuresed. Cardiac catheterization was recommended, and this was performed on 02/26/2014. This revealed severe 3 vessel coronary artery disease with EF 30-35%  CORONARY ARTERY BYPASS GRAFTING x 5 - 03/01/2014  Left internal mammary artery to left anterior descending  Sequential saphenous vein graft to first and second diagonals  Saphenous vein graft to obtuse marginal  Saphenous vein graft to posterior descending  Patient presents for post she reports doing quite well and has no specific complaints. She does sleep on 2 pillows but denies shortness of breath, lower extremity edema or PND.  Internal wound is healing well and is nonpainful.  He is taking all her medicines this prescribed.  She is due to follow up with primary care regarding diabetes. Her A1c was 13.1.  The patient currently denies nausea, vomiting, fever, chest pain, dizziness, cough, congestion, abdominal pain, hematochezia, melena, lower extremity edema, claudication.  Wt Readings from Last 3 Encounters:  03/23/14 171 lb 14.4 oz (77.973 kg)  03/06/14 198 lb 6.6 oz (90 kg)  12/12/12 195 lb 8 oz (88.678 kg)     Past Medical History  Diagnosis Date  . Diabetes  mellitus 1998    Dx in 1998. Microalbuminuria, never on insulin.  Marland Kitchen GERD (gastroesophageal reflux disease)   . Hyperlipidemia   . Hypertension   . History of blurry vision 06/09    Hospitalized for this  . Personal history of colonic adenomas 10/07/2007  . Pneumonia 01/2014    Current Outpatient Prescriptions  Medication Sig Dispense Refill  . aspirin EC 325 MG EC tablet Take 1 tablet (325 mg total) by mouth daily. 30 tablet 0  . carvedilol (COREG) 6.25 MG tablet Take 1.5 tablets (9.375 mg total) by mouth 2 (two) times daily with a meal. 90 tablet 11  . lisinopril (PRINIVIL,ZESTRIL) 10 MG tablet Take 1 tablet (10 mg total) by mouth daily. 30 tablet 3  . metFORMIN (GLUCOPHAGE) 1000 MG tablet Take 1 tablet (1,000 mg total) by mouth 2 (two) times daily with a meal. 60 tablet 3  . oxyCODONE (OXY IR/ROXICODONE) 5 MG immediate release tablet Take 1-2 tablets (5-10 mg total) by mouth every 3 (three) hours as needed for severe pain. 30 tablet 0  . simvastatin (ZOCOR) 20 MG tablet Take 1 tablet (20 mg total) by mouth at bedtime. 30 tablet 4   No current facility-administered medications for this visit.    Allergies:   No Known Allergies  Social History:  The patient  reports that she has never smoked. She has never used smokeless tobacco. She reports that she does not drink alcohol or use illicit drugs.   Family history:   Family History  Problem Relation Age of Onset  .  Diabetes Mother     Mother died of MI at age 95  . Heart failure Mother   . Kidney failure Mother   . Heart failure Sister     Died  . Heart failure Brother     Died  . Diabetes Brother     Died    ROS:  Please see the history of present illness.  All other systems reviewed and negative.   PHYSICAL EXAM: VS:  BP 120/52 mmHg  Pulse 105  Ht 5\' 2"  (1.575 m)  Wt 171 lb 14.4 oz (77.973 kg)  BMI 31.43 kg/m2 Obese, well developed, in no acute distress HEENT: Pupils are equal round react to light accommodation  extraocular movements are intact.  Neck: no JVDNo cervical lymphadenopathy. Cardiac: Regular rhythm and rate elevated without murmurs rubs or gallops. Lungs:  clear to auscultation bilaterally, no wheezing, rhonchi or rales Abd: soft, nontender, positive bowel sounds all quadrants, no hepatosplenomegaly Ext: no lower extremity edema.  2+ radial and dorsalis pedis pulses. Skin: warm and dry.  Sternal sternal wound is healing well with no erythema or discharge. Neuro:  Grossly normal  EKG: Sinus tachycardia with PVCs. Rate 105 bpm. Inferior lateral T-wave inversions   ASSESSMENT AND PLAN:  Problem List Items Addressed This Visit    Hyperlipidemia - Primary    Continue statin.  We should check her LFTs in 8 weeks.  Recheck lipids in 3 months.    Lipid Panel     Component Value Date/Time   CHOL 255* 02/22/2014 1046   TRIG 94 02/22/2014 1046   HDL 56 02/22/2014 1046   CHOLHDL 4.6 02/22/2014 1046   VLDL 19 02/22/2014 1046   LDLCALC 180* 02/22/2014 1046          Relevant Medications   carvedilol (COREG) tablet   Other Relevant Orders   Hepatic function panel   Diabetes mellitus type 2 with complications, uncontrolled    She is scheduled to see her primary care provider on March 10. Currently on metformin only      Coronary artery disease involving native coronary artery of native heart without angina pectoris   Relevant Medications   carvedilol (COREG) tablet   Cardiomyopathy, ischemic, EF 25-30%    Patient appears euvolemic. She's lost 27 pounds according to her last weight in the hospital and today's weight. Continue without any diuretic. Consider rechecking 2-D echocardiogram in 3 months.  She is on ACE inhibitor.      Relevant Medications   carvedilol (COREG) tablet    Other Visit Diagnoses    S/P CABG (coronary artery bypass graft)        Relevant Orders    EKG 12-Lead

## 2014-03-23 NOTE — Assessment & Plan Note (Signed)
No post hospital complaints. Sternal wound is healing well.

## 2014-03-23 NOTE — Assessment & Plan Note (Signed)
Plan to titrate her Coreg to 9.375 twice daily. She continues to be tachycardic but otherwise asymptomatic.  She [place] to a [facility] can check her blood pressure there periodically

## 2014-04-05 ENCOUNTER — Other Ambulatory Visit: Payer: Self-pay | Admitting: Cardiothoracic Surgery

## 2014-04-05 DIAGNOSIS — Z951 Presence of aortocoronary bypass graft: Secondary | ICD-10-CM

## 2014-04-08 ENCOUNTER — Ambulatory Visit (INDEPENDENT_AMBULATORY_CARE_PROVIDER_SITE_OTHER): Payer: Self-pay | Admitting: Cardiothoracic Surgery

## 2014-04-08 ENCOUNTER — Encounter: Payer: Self-pay | Admitting: Cardiothoracic Surgery

## 2014-04-08 ENCOUNTER — Ambulatory Visit
Admission: RE | Admit: 2014-04-08 | Discharge: 2014-04-08 | Disposition: A | Discharge status: Home / Self Care | Payer: BC Managed Care – PPO | Source: Ambulatory Visit | Attending: Cardiothoracic Surgery | Admitting: Cardiothoracic Surgery

## 2014-04-08 VITALS — BP 117/75 | HR 85 | Resp 16 | Ht 62.0 in | Wt 171.0 lb

## 2014-04-08 DIAGNOSIS — I2510 Atherosclerotic heart disease of native coronary artery without angina pectoris: Secondary | ICD-10-CM

## 2014-04-08 DIAGNOSIS — Z951 Presence of aortocoronary bypass graft: Secondary | ICD-10-CM

## 2014-04-08 NOTE — Patient Instructions (Signed)
    [address]       [place] [address]             [phone]       Coronary Artery Bypass Grafting  Care After  Refer to this sheet in the next few weeks. These instructions provide you with information on caring for yourself after your procedure. Your caregiver may also give you more specific instructions. Your treatment has been planned according to current medical practices, but problems sometimes occur. Call your caregiver if you have any problems or questions after your procedure.  Recovery from open heart surgery will be different for everyone. Some people feel well after 3 or 4 weeks, while for others it takes longer. After heart surgery, it may be normal to:  Not have an appetite, feel nauseated by the smell of food, or only want to eat a small amount.   Be constipated because of changes in your diet, activity, and medicines. Eat foods high in fiber. Add fresh fruits and vegetables to your diet. Stool softeners may be helpful.   Feel sad or unhappy. You may be frustrated or cranky. You may have good days and bad days. Do not give up. Talk to your caregiver if you do not feel better.   Feel weakness and fatigue. You many need physical therapy or cardiac rehabilitation to get your strength back.   Develop an irregular heartbeat called atrial fibrillation. Symptoms of atrial fibrillation are a fast, irregular heartbeat or feelings of fluttery heartbeats, shortness of breath, low blood pressure, and dizziness. If these symptoms develop, see your caregiver right away.  MEDICATION  Have a list of all the medicines you will be taking when you leave the hospital. For every medicine, know the following:   Name.   Exact dose.   Time of day to be taken.   How often it should be taken.   Why you are taking it.   Ask which medicines should or should not be taken together. If you take more than one heart medicine, ask if it is okay to take them together. Some  heart medicines should not be taken at the same time because they may lower your blood pressure too much.   Narcotic pain medicine can cause constipation. Eat fresh fruits and vegetables. Add fiber to your diet. Stool softener medicine may help relieve constipation.   Keep a copy of your medicines with you at all times.   Do not add or stop taking any medicine until you check with your caregiver.   Medicines can have side effects. Call your caregiver who prescribed the medicine if you:   Start throwing up, have diarrhea, or have stomach pain.   Feel dizzy or lightheaded when you stand up.   Feel your heart is skipping beats or is beating too fast or too slow.   Develop a rash.   Notice unusual bruising or bleeding.  HOME CARE INSTRUCTIONS  After heart surgery, it is important to learn how to take your pulse. Have your caregiver show you how to take your pulse.   Use your incentive spirometer. Ask your caregiver how long after surgery you need to use it.  Care of your chest incision  Tell your caregiver right away if you notice clicking in your chest (sternum).   Support your chest with a pillow or your arms when you take deep breaths and cough.   Follow your caregiver's instructions about when you can bathe or   swim.   Protect your incision from sunlight during the first year to keep the scar from getting dark.   Tell your caregiver if you notice:   Increased tenderness of your incision.   Increased redness or swelling around your incision.   Drainage or pus from your incision.  Care of your leg incision(s)  Avoid crossing your legs.   Avoid sitting for long periods of time. Change positions every half hour.   Elevate your leg(s) when you are sitting.   Check your leg(s) daily for swelling. Check the incisions for redness or drainage.   Diet is very important to heart health.   Eat plenty of fresh fruits and vegetables. Meats should be lean cut. Avoid canned,  processed, and fried foods.   Talk to a dietician. They can teach you how to make healthy food and drink choices.  Weight  Weigh yourself every day. This is important because it helps to know if you are retaining fluid that may make your heart and lungs work harder.   Use the same scale each time.   Weigh yourself every morning at the same time. You should do this after you go to the bathroom, but before you eat breakfast.   Your weight will be more accurate if you do not wear any clothes.   Record your weight.   Tell your caregiver if you have gained 2 pounds or more overnight.  Activity Stop any activity at once if you have chest pain, shortness of breath, irregular heartbeats, or dizziness. Get help right away if you have any of these symptoms.  Bathing.  Avoid soaking in a bath or hot tub until your incisions are healed.   Rest. You need a balance of rest and activity.   Exercise. Exercise per your caregiver's advice. You may need physical therapy or cardiac rehabilitation to help strengthen your muscles and build your endurance.   Climbing stairs. Unless your caregiver tells you not to climb stairs, go up stairs slowly and rest if you tire. Do not pull yourself up by the handrail.   Driving a car. Follow your caregiver's advice on when you may drive. You may ride as a passenger at any time. When traveling for long periods of time in a car, get out of the car and walk around for a few minutes every 2 hours.   Lifting. Avoid lifting, pushing, or pulling anything heavier than 10 pounds for 6 weeks after surgery or as told by your caregiver.   Returning to work. Check with your caregiver. People heal at different rates. Most people will be able to go back to work 6 to 12 weeks after surgery.   Sexual activity. You may resume sexual relations as told by your caregiver.  SEEK MEDICAL CARE IF:  Any of your incisions are red, painful, or have any type of drainage coming from them.     You have an oral temperature above 101.5 F .   You have ankle or leg swelling.   You have pain in your legs.   You have weight gain of 2 or more pounds a day.   You feel dizzy or lightheaded when you stand up.  SEEK IMMEDIATE MEDICAL CARE IF:  You have angina or chest pain that goes to your jaw or arms. Call your local emergency services right away.   You have shortness of breath at rest or with activity.   You have a fast or irregular heartbeat (arrhythmia).   There is   a "clicking" in your sternum when you move.   You have numbness or weakness in your arms or legs.  MAKE SURE YOU:  Understand these instructions.   Will watch your condition.   Will get help right away if you are not doing well or get worse.    No lifting over 25 lbs for 3 months Diabetes and Exercise Exercising regularly is important. It is not just about losing weight. It has many health benefits, such as:  Improving your overall fitness, flexibility, and endurance.  Increasing your bone density.  Helping with weight control.  Decreasing your body fat.  Increasing your muscle strength.  Reducing stress and tension.  Improving your overall health. People with diabetes who exercise gain additional benefits because exercise:  Reduces appetite.  Improves the body's use of blood sugar (glucose).  Helps lower or control blood glucose.  Decreases blood pressure.  Helps control blood lipids (such as cholesterol and triglycerides).  Improves the body's use of the hormone insulin by:  Increasing the body's insulin sensitivity.  Reducing the body's insulin needs.  Decreases the risk for heart disease because exercising:  Lowers cholesterol and triglycerides levels.  Increases the levels of good cholesterol (such as high-density lipoproteins [HDL]) in the body.  Lowers blood glucose levels. YOUR ACTIVITY PLAN  Choose an activity that you enjoy and set realistic goals. Your health care  provider or diabetes educator can help you make an activity plan that works for you. Exercise regularly as directed by your health care provider. This includes:  Performing resistance training twice a week such as push-ups, sit-ups, lifting weights, or using resistance bands.  Performing 150 minutes of cardio exercises each week such as walking, running, or playing sports.  Staying active and spending no more than 90 minutes at one time being inactive. Even short bursts of exercise are good for you. Three 10-minute sessions spread throughout the day are just as beneficial as a single 30-minute session. Some exercise ideas include:  Taking the dog for a walk.  Taking the stairs instead of the elevator.  Dancing to your favorite song.  Doing an exercise video.  Doing your favorite exercise with a friend. RECOMMENDATIONS FOR EXERCISING WITH TYPE 1 OR TYPE 2 DIABETES   Check your blood glucose before exercising. If blood glucose levels are greater than 240 mg/dL, check for urine ketones. Do not exercise if ketones are present.  Avoid injecting insulin into areas of the body that are going to be exercised. For example, avoid injecting insulin into:  The arms when playing tennis.  The legs when jogging.  Keep a record of:  Food intake before and after you exercise.  Expected peak times of insulin action.  Blood glucose levels before and after you exercise.  The type and amount of exercise you have done.  Review your records with your health care provider. Your health care provider will help you to develop guidelines for adjusting food intake and insulin amounts before and after exercising.  If you take insulin or oral hypoglycemic agents, watch for signs and symptoms of hypoglycemia. They include:  Dizziness.  Shaking.  Sweating.  Chills.  Confusion.  Drink plenty of water while you exercise to prevent dehydration or heat stroke. Body water is lost during exercise and  must be replaced.  Talk to your health care provider before starting an exercise program to make sure it is safe for you. Remember, almost any type of activity is better than none. Document Released: 04/07/2003  Document Revised: 06/01/2013 Document Reviewed: 06/24/2012 [facility] Patient Information 2015 [place], [place]. This information is not intended to replace advice given to you by your health care provider. Make sure you discuss any questions you have with your health care provider.

## 2014-04-08 NOTE — Progress Notes (Signed)
[address]       [place] [address]             [phone]      [name] [place] Medical Record [id number] Date of Birth: 03/13/1950  Referring: Lorretta Harp, MD Primary Care: Jenetta Downer, MD  Chief Complaint:   POST OP FOLLOW UP 03/01/2014  OPERATIVE REPORT PREOPERATIVE DIAGNOSIS: Severe 3-vessel coronary artery disease with severe LV dysfunction. Ejection fraction 25-30%. POSTOPERATIVE DIAGNOSIS: Severe 3-vessel coronary artery disease with severe LV dysfunction. Ejection fraction  25- 30 % SURGICAL PROCEDURE: Coronary artery bypass grafting x5 with left internal mammary to the left anterior descending coronary artery sequential reverse saphenous vein graft to the first diagonal and second diagonal, reverse saphenous vein graft to the obtuse marginal, reverse saphenous vein graft to the posterior descending with right thigh and calf endo vein harvesting.  History of Present Illness:     Patient doing well postoperatively considering that she presented with no control of her diabetes for the past year in severe congestive heart failure and because of her diabetes resultant coronary artery disease and underwent coronary artery bypass graft. Postoperatively she is making good progress she's had no overt symptoms of congestive heart failure or recurrent angina.     Past Medical History  Diagnosis Date  . Diabetes mellitus 1998    Dx in 1998. Microalbuminuria, never on insulin.  Marland Kitchen GERD (gastroesophageal reflux disease)   . Hyperlipidemia   . Hypertension   . History of blurry vision 06/09    Hospitalized for this  . Personal history of colonic adenomas 10/07/2007  . Pneumonia 01/2014     History  Smoking status  . Never Smoker   Smokeless tobacco  . Never Used    History  Alcohol Use No     No Known Allergies  Current Outpatient Prescriptions  Medication Sig Dispense Refill  . aspirin EC 325 MG EC tablet  Take 1 tablet (325 mg total) by mouth daily. 30 tablet 0  . carvedilol (COREG) 6.25 MG tablet Take 1.5 tablets (9.375 mg total) by mouth 2 (two) times daily with a meal. 90 tablet 11  . lisinopril (PRINIVIL,ZESTRIL) 10 MG tablet Take 1 tablet (10 mg total) by mouth daily. 30 tablet 3  . metFORMIN (GLUCOPHAGE) 1000 MG tablet Take 1 tablet (1,000 mg total) by mouth 2 (two) times daily with a meal. 60 tablet 3  . simvastatin (ZOCOR) 20 MG tablet Take 1 tablet (20 mg total) by mouth at bedtime. 30 tablet 4   No current facility-administered medications for this visit.       Physical Exam: BP 117/75 mmHg  Pulse 85  Resp 16  Ht 5\' 2"  (1.575 m)  Wt 171 lb (77.565 kg)  BMI 31.27 kg/m2  SpO2 98%  General appearance: alert and cooperative Neurologic: intact Heart: regular rate and rhythm, S1, S2 normal, no murmur, click, rub or gallop Lungs: clear to auscultation bilaterally and normal percussion bilaterally Abdomen: soft, non-tender; bowel sounds normal; no masses,  no organomegaly Extremities: edema mild edema pedal bitlateril and Homans sign is negative, no sign of DVT Wound: sternum stable well healed   Diagnostic Studies & Laboratory data:     Recent Radiology Findings:   Dg Chest 2 View  04/08/2014   CLINICAL DATA:  CABG.  Fatigue.  Shortness of breath.  EXAM: CHEST  2 VIEW  COMPARISON:  03/05/2014.  FINDINGS: Mediastinum and hilar structures are normal. Interim  partial improvement of bibasilar atelectasis. Interim present resolution of right pleural effusion. Stable cardiomegaly. Prior CABG. No pneumothorax. No acute osseous abnormality.  IMPRESSION: 1. Interim partial resolution of bibasilar atelectasis and right pleural effusion. 2. Prior CABG. Stable cardiomegaly. No pulmonary venous congestion.   Electronically Signed   By: Marcello Moores  Register   On: 04/08/2014 10:00      Recent Lab Findings: Lab Results  Component Value Date   WBC 10.3 03/04/2014   HGB 9.8* 03/04/2014   HCT  29.1* 03/04/2014   PLT 131* 03/04/2014   GLUCOSE 107* 03/05/2014   CHOL 255* 02/22/2014   TRIG 94 02/22/2014   HDL 56 02/22/2014   LDLCALC 180* 02/22/2014   ALT 27 02/28/2014   AST 37 02/28/2014   NA 138 03/05/2014   K 4.2 03/05/2014   CL 104 03/05/2014   CREATININE 1.03 03/05/2014   BUN 21 03/05/2014   CO2 28 03/05/2014   TSH 1.870 02/22/2014   INR 1.22 03/01/2014   HGBA1C 13.1* 02/22/2014      Assessment / Plan:     Patient with preop severe LV dysfunction ejection fraction 25-30% with uncontrolled untreated diabetes resulting in coronary artery disease now status post coronary artery bypass grafting, symptoms of congestive heart failure have improved she's had no recurrent angina. We'll plan follow-up echocardiogram in 3 months During her hospitalization she was restarted on metformin which she is now taking, we will try to make arrangements for her to have a definitive care for her diabetes I'll plan to see her back when necessary she is continued tobe followed by cardiology She's been encouraged to start in the cardiac rehabilitation program   Grace Isaac MD      [address] [place] [address] Office [phone]   Beeper [phone]  04/08/2014 10:20 AM

## 2014-04-27 ENCOUNTER — Encounter: Payer: BC Managed Care – PPO | Admitting: Internal Medicine

## 2014-04-28 ENCOUNTER — Telehealth: Payer: Self-pay | Admitting: Internal Medicine

## 2014-04-28 NOTE — Telephone Encounter (Signed)
Call to patient to confirm appointment for 04/29/14 at 1:45 lmtcb

## 2014-04-29 ENCOUNTER — Encounter: Payer: Self-pay | Admitting: Internal Medicine

## 2014-04-29 ENCOUNTER — Ambulatory Visit (INDEPENDENT_AMBULATORY_CARE_PROVIDER_SITE_OTHER): Payer: BC Managed Care – PPO | Admitting: Internal Medicine

## 2014-04-29 VITALS — BP 120/64 | HR 102 | Temp 97.9°F | Wt 171.2 lb

## 2014-04-29 DIAGNOSIS — E118 Type 2 diabetes mellitus with unspecified complications: Secondary | ICD-10-CM

## 2014-04-29 DIAGNOSIS — E785 Hyperlipidemia, unspecified: Secondary | ICD-10-CM | POA: Diagnosis not present

## 2014-04-29 DIAGNOSIS — E1165 Type 2 diabetes mellitus with hyperglycemia: Secondary | ICD-10-CM

## 2014-04-29 DIAGNOSIS — Z0000 Encounter for general adult medical examination without abnormal findings: Secondary | ICD-10-CM | POA: Insufficient documentation

## 2014-04-29 DIAGNOSIS — I10 Essential (primary) hypertension: Secondary | ICD-10-CM

## 2014-04-29 DIAGNOSIS — I255 Ischemic cardiomyopathy: Secondary | ICD-10-CM | POA: Diagnosis not present

## 2014-04-29 DIAGNOSIS — IMO0002 Reserved for concepts with insufficient information to code with codable children: Secondary | ICD-10-CM

## 2014-04-29 LAB — GLUCOSE, CAPILLARY: Glucose-Capillary: 126 mg/dL — ABNORMAL HIGH (ref 70–99)

## 2014-04-29 LAB — CBC
HEMATOCRIT: 33.5 % — AB (ref 36.0–46.0)
HEMOGLOBIN: 10.7 g/dL — AB (ref 12.0–15.0)
MCH: 26 pg (ref 26.0–34.0)
MCHC: 31.9 g/dL (ref 30.0–36.0)
MCV: 81.5 fL (ref 78.0–100.0)
MPV: 10.1 fL (ref 8.6–12.4)
Platelets: 250 10*3/uL (ref 150–400)
RBC: 4.11 MIL/uL (ref 3.87–5.11)
RDW: 16.8 % — ABNORMAL HIGH (ref 11.5–15.5)
WBC: 6.7 10*3/uL (ref 4.0–10.5)

## 2014-04-29 MED ORDER — BLOOD GLUCOSE MONITOR KIT: PACK | Status: DC

## 2014-04-29 MED ORDER — LANCETS MICRO THIN 33G MISC: 1.0000 [IU] | Freq: Every day | Status: DC

## 2014-04-29 MED ORDER — GLUCOSE BLOOD VI STRP: ORAL_STRIP | Status: DC

## 2014-04-29 MED ORDER — ATORVASTATIN CALCIUM 40 MG PO TABS: 40.0000 mg | ORAL_TABLET | Freq: Every day | ORAL | Status: DC

## 2014-04-29 NOTE — Patient Instructions (Signed)
Please check your blood sugars Once a day, every morning before you eat. This is important, so we can determine if you will require additional medications to get your diabetes under control.  We will like to see you in 2 weeks, with your Glucometer, please do not forget to bring your glucometer along.  It was nice seeing you today.  Basic Carbohydrate Counting for Diabetes Mellitus Carbohydrate counting is a method for keeping track of the amount of carbohydrates you eat. Eating carbohydrates naturally increases the level of sugar (glucose) in your blood, so it is important for you to know the amount that is okay for you to have in every meal. Carbohydrate counting helps keep the level of glucose in your blood within normal limits. The amount of carbohydrates allowed is different for every person. A dietitian can help you calculate the amount that is right for you. Once you know the amount of carbohydrates you can have, you can count the carbohydrates in the foods you want to eat. Carbohydrates are found in the following foods:  Grains, such as breads and cereals.  Dried beans and soy products.  Starchy vegetables, such as potatoes, peas, and corn.  Fruit and fruit juices.  Milk and yogurt.  Sweets and snack foods, such as cake, cookies, candy, chips, soft drinks, and fruit drinks. CARBOHYDRATE COUNTING There are two ways to count the carbohydrates in your food. You can use either of the methods or a combination of both. Reading the "Nutrition Facts" on [place] The "Nutrition Facts" is an area that is included on the labels of almost all packaged food and beverages in the [place]. It includes the serving size of that food or beverage and information about the nutrients in each serving of the food, including the grams (g) of carbohydrate per serving.  Decide the number of servings of this food or beverage that you will be able to eat or drink. Multiply that number of servings by  the number of grams of carbohydrate that is listed on the label for that serving. The total will be the amount of carbohydrates you will be having when you eat or drink this food or beverage. Learning Standard Serving Sizes of Food When you eat food that is not packaged or does not include "Nutrition Facts" on the label, you need to measure the servings in order to count the amount of carbohydrates.A serving of most carbohydrate-rich foods contains about 15 g of carbohydrates. The following list includes serving sizes of carbohydrate-rich foods that provide 15 g ofcarbohydrate per serving:   1 slice of bread (1 oz) or 1 six-inch tortilla.    of a hamburger bun or English muffin.  4-6 crackers.   cup unsweetened dry cereal.    cup hot cereal.   cup rice or pasta.    cup mashed potatoes or  of a large baked potato.  1 cup fresh fruit or one small piece of fruit.    cup canned or frozen fruit or fruit juice.  1 cup milk.   cup plain fat-free yogurt or yogurt sweetened with artificial sweeteners.   cup cooked dried beans or starchy vegetable, such as peas, corn, or potatoes.  Decide the number of standard-size servings that you will eat. Multiply that number of servings by 15 (the grams of carbohydrates in that serving). For example, if you eat 2 cups of strawberries, you will have eaten 2 servings and 30 g of carbohydrates (2 servings x 15 g = 30  g). For foods such as soups and casseroles, in which more than one food is mixed in, you will need to count the carbohydrates in each food that is included. EXAMPLE OF CARBOHYDRATE COUNTING Sample Dinner  3 oz chicken breast.   cup of brown rice.   cup of corn.  1 cup milk.   1 cup strawberries with sugar-free whipped topping.  Carbohydrate Calculation Step 1: Identify the foods that contain carbohydrates:   Rice.   Corn.   Milk.   Strawberries. Step 2:Calculate the number of servings eaten of each:    2 servings of rice.   1 serving of corn.   1 serving of milk.   1 serving of strawberries. Step 3: Multiply each of those number of servings by 15 g:   2 servings of rice x 15 g = 30 g.   1 serving of corn x 15 g = 15 g.   1 serving of milk x 15 g = 15 g.   1 serving of strawberries x 15 g = 15 g. Step 4: Add together all of the amounts to find the total grams of carbohydrates eaten: 30 g + 15 g + 15 g + 15 g = 75 g.

## 2014-04-29 NOTE — Assessment & Plan Note (Signed)
02/22/2014- LDL- 180, TG- 94, HDL- 56, Total- 255.  Liver function- 02/22/2014- AST- 37, ALT- 27, ALP- 74.  Patient has been on 20mg  simvastatin  Plan- Will switch to high intensity statin, considering CAD and DM.  - Start Atorvastatin 40mg  daily.

## 2014-04-29 NOTE — Assessment & Plan Note (Signed)
Foot exam today. Referral for eye exam today. Has had a hysterectomy, and up to date on colonoscopy. mammogram due in June 2016.

## 2014-04-29 NOTE — Assessment & Plan Note (Addendum)
S/p CABG, tolerated procedure well. Doing okay, denies chest pain, tolerance gradually improving. No symptoms or signs of fluid overload today. Complaint with heart failure meds- Lisinopril- 10mg  daily, Coreg- 9.375mg  BID. Pt was also on Statin- simvastatin- 20mg  daily. No allergies, never been on Statin.  Plan- Will switch to a high intensity statin- Lipitor- 40mg  daily. Can uptitrate later if tolerates well.  - Post op blood loss, CBC today. Has been taking iron suppliments.

## 2014-04-29 NOTE — Progress Notes (Signed)
Case discussed with Dr. Emokpae soon after the resident saw the patient. We reviewed the resident's history and exam and pertinent patient test results. I agree with the assessment, diagnosis, and plan of care documented in the resident's note. 

## 2014-04-29 NOTE — Progress Notes (Signed)
Patient ID: HAZELY SEALEY, female   DOB: 08-25-1950, 64 y.o.   MRN: [id number]   Subjective:   Patient ID: [name] female   DOB: 10/23/1950 64 y.o.   MRN: [id number]  HPI: [name] is a 64 y.o. 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 She has no complainst today. Her breathing has signficantly improved, but she still has significant limitation in teh distance she is able to work and tolerance, which is understandable considering her medical history, She is also requesting paparwork be filled so she can apply and use [employer] for transportation.   Her last visit to clinic was in 2014, and for no reason she stopped following up.  Past Medical History  Diagnosis Date  . Diabetes mellitus 1998    Dx in 1998. Microalbuminuria, never on insulin.  Marland Kitchen GERD (gastroesophageal reflux disease)   . Hyperlipidemia   . Hypertension   . History of blurry vision 06/09    Hospitalized for this  . Personal history of colonic adenomas 10/07/2007  . Pneumonia 01/2014   Current Outpatient Prescriptions  Medication Sig Dispense Refill  . aspirin EC 325 MG EC tablet Take 1 tablet (325 mg total) by mouth daily. 30 tablet 0  . blood glucose meter kit and supplies KIT Dispense based on patient and insurance preference. Use up to four times daily as directed. (FOR ICD-9 250.00, 250.01). 1 each 0  . carvedilol (COREG) 6.25 MG tablet Take 1.5 tablets (9.375 mg total) by mouth 2 (two) times daily with a meal. 90 tablet 11  . glucose blood (ACCU-CHEK ACTIVE STRIPS) test strip Use as instructed 100 each 12  . LANCETS MICRO THIN 33G MISC 1 Units by Does not apply route daily. 100 each 12  . lisinopril (PRINIVIL,ZESTRIL) 10 MG tablet Take 1 tablet (10 mg total) by mouth daily. 30 tablet 3  . metFORMIN (GLUCOPHAGE) 1000 MG tablet Take 1 tablet (1,000 mg total) by mouth 2 (two) times daily  with a meal. 60 tablet 3  . simvastatin (ZOCOR) 20 MG tablet Take 1 tablet (20 mg total) by mouth at bedtime. 30 tablet 4   No current facility-administered medications for this visit.   Family History  Problem Relation Age of Onset  . Diabetes Mother     Mother died of MI at age 35  . Heart failure Mother   . Kidney failure Mother   . Heart failure Sister     Died  . Heart failure Brother     Died  . Diabetes Brother     Died   History   Social History  . Marital Status: Widowed    Spouse Name: N/A  . Number of Children: N/A  . Years of Education: N/A   Social History Main Topics  . Smoking status: Never Smoker   . Smokeless tobacco: Never Used  . Alcohol Use: No  . Drug Use: No  . Sexual Activity: Not on file   Other Topics Concern  . None   Social History Narrative   Married x 42 yrs.  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   Never smoked. Doesn't drink or use drugs.   Does Patient Exercise:  yes - sometimes      To get diabetes supplies for [name] RD, CDE, April 20,2011   Review of Systems: CONSTITUTIONAL- No Fever,  or change in appetite. SKIN- No Rash, colour changes or itching. HEAD- No Headache or dizziness. RESPIRATORY- No Cough or SOB. CARDIAC- No Palpitations, chest pain. GI- No  vomiting, diarrhoea, abd pain. URINARY- No Frequency, or dysuria. NEUROLOGIC- No Numbness, syncope, seizures or burning. [facility]- Denies depression or anxiety.  Objective:  Physical Exam: Filed Vitals:   04/29/14 1338  BP: 120/64  Pulse: 102  Temp: 97.9 F (36.6 C)  TempSrc: Oral  Weight: 171 lb 3.2 oz (77.656 kg)  SpO2: 100%   GENERAL- alert, co-operative, appears as stated age, not in any distress. HEENT- Atraumatic, normocephalic, PERRL, poor dentition. CARDIAC- RRR, no murmurs, rubs or gallops. RESP- Moving equal volumes of air, and clear to auscultation bilaterally, no wheezes or  crackles. ABDOMEN- Soft, nontender, no palpable masses or organomegaly. NEURO- No obvious Cr N abnormality, Gait- Normal. EXTREMITIES- pulse 2+, symmetric, no pedal edema. SKIN- Warm, dry, No rash or lesion,. 2+ symmetric Dp pulses palpable, well perfused. PSYCH- Normal mood and affect, appropriate thought content and speech.  Assessment & Plan:  The patient's case and plan of care was discussed with attending physician, Dr. Eppie Gibson.  Please see problem based charting for assessment and plan.

## 2014-04-29 NOTE — Assessment & Plan Note (Signed)
BP Readings from Last 3 Encounters:  04/29/14 120/64  04/08/14 117/75  03/23/14 120/52    Lab Results  Component Value Date   NA 138 03/05/2014   K 4.2 03/05/2014   CREATININE 1.03 03/05/2014    Assessment: Blood pressure control:  Controlled Progress toward BP goal:   At goal Comments: Complaint with meds- Lisinopril- 10mg  daily, Coreg- 9.375 mg BID. Slightly tachy- 102 today. Meds titrated by Cards, considering significant CHF. Will defer to cards.   Plan: Medications:  continue current medications Educational resources provided:   Self management tools provided:   Other plans:

## 2014-04-29 NOTE — Assessment & Plan Note (Signed)
Lab Results  Component Value Date   HGBA1C 13.1* 02/22/2014   HGBA1C >14.0 12/12/2012   HGBA1C 14.0 04/26/2011     Assessment: Diabetes control:  Uncontrolled Progress toward A1C goal:   Not at goal Comments: Complaint with metformin 1000mg  BID. She has not been taking any other medications. Blood sugars on admission appeared to be low normal, at a point she was in the ICU and on insulin infusion, and prior to that was on a Sensitive sliding scale, requiring very little iunsulin. She says he has been watching her diet and has cut out sweets.  Plan: Medications:  continue current medications Home glucose monitoring: Frequency:  once daily Timing:  Fasting Instruction/counseling given: reminded to bring blood glucose meter & log to each visit and discussed diet Educational resources provided:   Self management tools provided:   Other plans: With pts HgBA1c this high she most likely needs more aggressive therapy with insulin, but considering blood sugars during admission and requirements of insulin, will like to see her fasting blood sugars too decide what she requires. Will see her in 2 weeks, with Glucometer readings. - prescriptions for Glucometer supplies sent to pharmacy. - Patient advised that she needs to keep all her appointments and the importance of following up, she voiced understanding.

## 2014-06-01 NOTE — Addendum Note (Signed)
Addended by: Hulan Fray on: 06/01/2014 05:39 PM   Modules accepted: Orders

## 2014-06-08 ENCOUNTER — Encounter: Payer: Self-pay | Admitting: Internal Medicine

## 2014-06-24 ENCOUNTER — Other Ambulatory Visit: Payer: Self-pay | Admitting: *Deleted

## 2014-06-24 DIAGNOSIS — Z23 Encounter for immunization: Secondary | ICD-10-CM

## 2014-06-24 MED ORDER — LISINOPRIL 10 MG PO TABS: 10.0000 mg | ORAL_TABLET | Freq: Every day | ORAL | Status: DC

## 2014-06-24 MED ORDER — METFORMIN HCL 1000 MG PO TABS: 1000.0000 mg | ORAL_TABLET | Freq: Two times a day (BID) | ORAL | Status: DC

## 2014-06-26 NOTE — Progress Notes (Signed)
HPI: FU CAD; recently admitted with CHF; echo 1/16 showed EF 25-30, mild to moderate MR, mild LAE/RAE/RVE; moderately reduced RV function; small pericardial effusion. Carotid dopplers 1/16 showed 1-39 bilateral stenosis. Cath revealed 3 vessel CAD and EF 30-35. Had CABG with LIMA to LAD, seq svg to first and second diagonal, svg to OM, svg to PDA. Since last seen, the patient has dyspnea with more extreme activities but not with routine activities. It is relieved with rest. It is not associated with chest pain. There is no orthopnea, PND or pedal edema. There is no syncope or palpitations. There is no exertional chest pain.   Current Outpatient Prescriptions  Medication Sig Dispense Refill  . aspirin EC 325 MG EC tablet Take 1 tablet (325 mg total) by mouth daily. 30 tablet 0  . atorvastatin (LIPITOR) 40 MG tablet Take 1 tablet (40 mg total) by mouth daily. 30 tablet 2  . blood glucose meter kit and supplies KIT Dispense based on patient and insurance preference. Use up to four times daily as directed. (FOR ICD-9 250.00, 250.01). 1 each 0  . carvedilol (COREG) 6.25 MG tablet Take 1.5 tablets (9.375 mg total) by mouth 2 (two) times daily with a meal. 90 tablet 11  . glucose blood (ACCU-CHEK ACTIVE STRIPS) test strip Use as instructed 100 each 12  . LANCETS MICRO THIN 33G MISC 1 Units by Does not apply route daily. 100 each 12  . lisinopril (PRINIVIL,ZESTRIL) 10 MG tablet Take 1 tablet (10 mg total) by mouth daily. 90 tablet 0  . metFORMIN (GLUCOPHAGE) 1000 MG tablet Take 1 tablet (1,000 mg total) by mouth 2 (two) times daily with a meal. 180 tablet 0   No current facility-administered medications for this visit.     Past Medical History  Diagnosis Date  . Diabetes mellitus 1998    Dx in 1998. Microalbuminuria, never on insulin.  Marland Kitchen GERD (gastroesophageal reflux disease)   . Hyperlipidemia   . Hypertension   . History of blurry vision 06/09    Hospitalized for this  . Personal  history of colonic adenomas 10/07/2007  . Pneumonia 01/2014    Past Surgical History  Procedure Laterality Date  . Total abdominal hysterectomy w/ bilateral salpingoophorectomy  1996  . Left and right heart catheterization with coronary angiogram N/A 02/26/2014    Procedure: LEFT AND RIGHT HEART CATHETERIZATION WITH CORONARY ANGIOGRAM;  Surgeon: Sinclair Grooms, MD;  Location[facility] C[facility] LAB;  Service: Cardiovascular;  Laterality: N/A;  . Coronary artery bypass graft N/A 03/01/2014    Procedure: CORONARY ARTERY BYPASS GRAFTING (CABG) x five, using left internal mammary artery and right leg greater saphenous vein harvested endoscopically;  Surgeon: Grace Isaac, MD;  Location: [place];  Service: Open Heart Surgery;  Laterality: N/A;  . Intraoperative transesophageal echocardiogram N/A 03/01/2014    Procedure: INTRAOPERATIVE TRANSESOPHAGEAL ECHOCARDIOGRAM;  Surgeon: Grace Isaac, MD;  Location: [place];  Service: Open Heart Surgery;  Laterality: N/A;    History   Social History  . Marital Status: Widowed    Spouse Name: N/A  . Number of Children: N/A  . Years of Education: N/A   Occupational History  . Not on file.   Social History Main Topics  . Smoking status: Never Smoker   . Smokeless tobacco: Never Used  . Alcohol Use: No  . Drug Use: No  . Sexual Activity: Not on file   Other Topics Concern  . Not on file  Social History Narrative   Married x 42 yrs.  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   Never smoked. Doesn't drink or use drugs.   Does Patient Exercise:  yes - sometimes      To get diabetes supplies for [name] RD, CDE, April 20,2011    ROS: no fevers or chills, productive cough, hemoptysis, dysphasia, odynophagia, melena, hematochezia, dysuria, hematuria, rash, seizure activity, orthopnea, PND, pedal edema, claudication. Remaining systems are negative.  Physical Exam: Well-developed well-nourished  in no acute distress.  Skin is warm and dry.  HEENT is normal.  Neck is supple.  Chest is clear to auscultation with normal expansion.  Cardiovascular exam is regular rate and rhythm.  Abdominal exam nontender or distended. No masses palpated. Extremities show no edema. neuro grossly intact  ECG

## 2014-06-29 ENCOUNTER — Encounter: Payer: Self-pay | Admitting: Cardiology

## 2014-06-29 ENCOUNTER — Ambulatory Visit (INDEPENDENT_AMBULATORY_CARE_PROVIDER_SITE_OTHER): Payer: BC Managed Care – PPO | Admitting: Cardiology

## 2014-06-29 ENCOUNTER — Encounter: Payer: Self-pay | Admitting: *Deleted

## 2014-06-29 DIAGNOSIS — E785 Hyperlipidemia, unspecified: Secondary | ICD-10-CM | POA: Diagnosis not present

## 2014-06-29 DIAGNOSIS — I255 Ischemic cardiomyopathy: Secondary | ICD-10-CM | POA: Diagnosis not present

## 2014-06-29 DIAGNOSIS — E118 Type 2 diabetes mellitus with unspecified complications: Secondary | ICD-10-CM

## 2014-06-29 DIAGNOSIS — E1165 Type 2 diabetes mellitus with hyperglycemia: Secondary | ICD-10-CM

## 2014-06-29 DIAGNOSIS — IMO0002 Reserved for concepts with insufficient information to code with codable children: Secondary | ICD-10-CM

## 2014-06-29 MED ORDER — ATORVASTATIN CALCIUM 80 MG PO TABS: 80.0000 mg | ORAL_TABLET | Freq: Every day | ORAL | Status: DC

## 2014-06-29 MED ORDER — CARVEDILOL 12.5 MG PO TABS: 12.5000 mg | ORAL_TABLET | Freq: Two times a day (BID) | ORAL | Status: DC

## 2014-06-29 NOTE — Assessment & Plan Note (Signed)
Given documented coronary artery disease increase Lipitor to 80 mg daily. Check lipids and liver in 4 weeks. 

## 2014-06-29 NOTE — Assessment & Plan Note (Signed)
Continue lisinopril. Increase carvedilol to 12.5 mg by mouth twice a day. Repeat echocardiogram. Hopefully LV function will have improved following revascularization. If not we need to consider ICD.

## 2014-06-29 NOTE — Assessment & Plan Note (Signed)
Blood pressure controlled. Continue present medications. 

## 2014-06-29 NOTE — Patient Instructions (Signed)
Your physician wants you to follow-up in: [place] will receive a reminder letter in the mail two months in advance. If you don't receive a letter, please call our office to schedule the follow-up appointment.   Your physician has requested that you have an echocardiogram. Echocardiography is a painless test that uses sound waves to create images of your heart. It provides your doctor with information about the size and shape of your heart and how well your heart's chambers and valves are working. This procedure takes approximately one hour. There are no restrictions for this procedure.   INCREASE ATORVASTATIN TO 80 MG ONCE DAILY= 2 OF THE 40 MG TABLETS ONCE DAILY  Your physician recommends that you return for lab work in: 4 WEEKS= DO NOT EAT PRIOR TO LAB WORK  INCREASE CARVEDILOL TO 12.5 MG TWICE DAILY= 2 OF THE 6.25 MG TABLETS TWICE DAILY

## 2014-06-29 NOTE — Assessment & Plan Note (Signed)
Continue aspirin and statin. 

## 2014-07-29 ENCOUNTER — Ambulatory Visit (HOSPITAL_COMMUNITY)
Admission: RE | Admit: 2014-07-29 | Discharge: 2014-07-29 | Disposition: A | Discharge status: Home / Self Care | Payer: BC Managed Care – PPO | Source: Ambulatory Visit | Attending: Cardiology | Admitting: Cardiology

## 2014-07-29 DIAGNOSIS — I358 Other nonrheumatic aortic valve disorders: Secondary | ICD-10-CM | POA: Insufficient documentation

## 2014-07-29 DIAGNOSIS — I340 Nonrheumatic mitral (valve) insufficiency: Secondary | ICD-10-CM | POA: Diagnosis not present

## 2014-07-29 DIAGNOSIS — I255 Ischemic cardiomyopathy: Secondary | ICD-10-CM

## 2014-07-29 DIAGNOSIS — I2510 Atherosclerotic heart disease of native coronary artery without angina pectoris: Secondary | ICD-10-CM | POA: Diagnosis present

## 2014-08-04 ENCOUNTER — Telehealth: Payer: Self-pay | Admitting: Cardiology

## 2014-08-04 NOTE — Telephone Encounter (Signed)
Returning call,she does not know who called her.

## 2014-08-19 ENCOUNTER — Other Ambulatory Visit: Payer: Self-pay | Admitting: *Deleted

## 2014-08-19 DIAGNOSIS — R931 Abnormal findings on diagnostic imaging of heart and coronary circulation: Secondary | ICD-10-CM

## 2014-08-19 DIAGNOSIS — I10 Essential (primary) hypertension: Secondary | ICD-10-CM

## 2014-08-23 ENCOUNTER — Telehealth: Payer: Self-pay | Admitting: Internal Medicine

## 2014-08-23 NOTE — Telephone Encounter (Signed)
Call to patient to confirm appointment for 08/24/14 at 2:15 lmtcb

## 2014-08-24 ENCOUNTER — Encounter: Payer: Self-pay | Admitting: Internal Medicine

## 2014-08-24 ENCOUNTER — Telehealth: Payer: Self-pay | Admitting: Cardiology

## 2014-08-24 ENCOUNTER — Ambulatory Visit (INDEPENDENT_AMBULATORY_CARE_PROVIDER_SITE_OTHER): Payer: BC Managed Care – PPO | Admitting: Internal Medicine

## 2014-08-24 VITALS — BP 135/68 | HR 94 | Temp 98.5°F | Wt 156.2 lb

## 2014-08-24 DIAGNOSIS — I255 Ischemic cardiomyopathy: Secondary | ICD-10-CM

## 2014-08-24 DIAGNOSIS — E1165 Type 2 diabetes mellitus with hyperglycemia: Secondary | ICD-10-CM

## 2014-08-24 DIAGNOSIS — Z79899 Other long term (current) drug therapy: Secondary | ICD-10-CM

## 2014-08-24 DIAGNOSIS — I5022 Chronic systolic (congestive) heart failure: Secondary | ICD-10-CM

## 2014-08-24 DIAGNOSIS — D649 Anemia, unspecified: Secondary | ICD-10-CM

## 2014-08-24 DIAGNOSIS — Z951 Presence of aortocoronary bypass graft: Secondary | ICD-10-CM

## 2014-08-24 DIAGNOSIS — H538 Other visual disturbances: Secondary | ICD-10-CM

## 2014-08-24 DIAGNOSIS — E118 Type 2 diabetes mellitus with unspecified complications: Principal | ICD-10-CM

## 2014-08-24 DIAGNOSIS — Z0000 Encounter for general adult medical examination without abnormal findings: Secondary | ICD-10-CM

## 2014-08-24 DIAGNOSIS — I10 Essential (primary) hypertension: Secondary | ICD-10-CM | POA: Diagnosis not present

## 2014-08-24 DIAGNOSIS — E119 Type 2 diabetes mellitus without complications: Secondary | ICD-10-CM | POA: Diagnosis not present

## 2014-08-24 DIAGNOSIS — E785 Hyperlipidemia, unspecified: Secondary | ICD-10-CM

## 2014-08-24 DIAGNOSIS — IMO0002 Reserved for concepts with insufficient information to code with codable children: Secondary | ICD-10-CM

## 2014-08-24 LAB — GLUCOSE, CAPILLARY: Glucose-Capillary: 97 mg/dL (ref 65–99)

## 2014-08-24 LAB — POCT GLYCOSYLATED HEMOGLOBIN (HGB A1C): Hemoglobin A1C: 5.7

## 2014-08-24 NOTE — Assessment & Plan Note (Signed)
Has procedure planned with [facility] on [address]. Request records.

## 2014-08-24 NOTE — Assessment & Plan Note (Signed)
Order mammogram today

## 2014-08-24 NOTE — Assessment & Plan Note (Addendum)
BP Readings from Last 3 Encounters:  08/24/14 135/68  06/29/14 128/80  04/29/14 120/64    Lab Results  Component Value Date   NA 138 03/05/2014   K 4.2 03/05/2014   CREATININE 1.03 03/05/2014    Assessment: Blood pressure control:  Controlled Progress toward BP goal:   At goal Comments: Complaint with meds- Coreg 12.5mg  BID, Lisinopril- 10mg  daily.  Plan: Medications:  continue current medications Educational resources provided: brochure Self management tools provided:   Other plans:  Bmet already ordered and in process. - CBC- has anemia, last checked in March.

## 2014-08-24 NOTE — Patient Instructions (Signed)
It was nice seeing you today.   For now we will like you to stop taking your metformin. But please continue with watching your diet.   Also please follow up with your heart doctor and Eye doctor.   We will like to see you in 4 months.

## 2014-08-24 NOTE — Assessment & Plan Note (Signed)
Last EF on echo- 25-30%- June 2016, with diffuse hypokinesis. No improvement post CABG. Likely needs ICD. Has CT chest ordered for 08/25/2014.   Plan- Cont Lisinopril 10, Coreg 12.5, atorvastatin 80mg  daily and aspirin 325mg  daily.

## 2014-08-24 NOTE — Telephone Encounter (Signed)
Marzetta Board called in wanting to speak with Hilda Blades. She says that the pt is coming in for a CT tomorrow and she needed her BUN and Creatine levels drawn. Please call her back  Thanks

## 2014-08-24 NOTE — Progress Notes (Signed)
Patient ID: [name], female   DOB: 1950/02/12, 64 y.o.   MRN: [id number]   Subjective:   Patient ID: [name] female   DOB: [birth year] 64 y.o.   MRN: [id number]  HPI: [name] is a 64 y.o. with PMH listed below. Presented for follow up of her Diabetes and chronic medical problems.  Past Medical History  Diagnosis Date  . Diabetes mellitus 1998    Dx in 1998. Microalbuminuria, never on insulin.  Marland Kitchen GERD (gastroesophageal reflux disease)   . Hyperlipidemia   . Hypertension   . History of blurry vision 06/09    Hospitalized for this  . Personal history of colonic adenomas 10/07/2007  . Pneumonia 01/2014   Current Outpatient Prescriptions  Medication Sig Dispense Refill  . aspirin EC 325 MG EC tablet Take 1 tablet (325 mg total) by mouth daily. 30 tablet 0  . atorvastatin (LIPITOR) 80 MG tablet Take 1 tablet (80 mg total) by mouth daily. 90 tablet 3  . blood glucose meter kit and supplies KIT Dispense based on patient and insurance preference. Use up to four times daily as directed. (FOR ICD-9 250.00, 250.01). 1 each 0  . carvedilol (COREG) 12.5 MG tablet Take 1 tablet (12.5 mg total) by mouth 2 (two) times daily with a meal. 180 tablet 3  . glucose blood (ACCU-CHEK ACTIVE STRIPS) test strip Use as instructed 100 each 12  . LANCETS MICRO THIN 33G MISC 1 Units by Does not apply route daily. 100 each 12  . lisinopril (PRINIVIL,ZESTRIL) 10 MG tablet Take 1 tablet (10 mg total) by mouth daily. 90 tablet 0  . metFORMIN (GLUCOPHAGE) 1000 MG tablet Take 1 tablet (1,000 mg total) by mouth 2 (two) times daily with a meal. 180 tablet 0   No current facility-administered medications for this visit.   Family History  Problem Relation Age of Onset  . Diabetes Mother     Mother died of MI at age 58  . Heart failure Mother   . Kidney failure Mother   . Heart failure Sister     Died  . Heart failure Brother     Died  . Diabetes Brother     Died   History   Social  History  . Marital Status: Widowed    Spouse Name: N/A  . Number of Children: N/A  . Years of Education: N/A   Social History Main Topics  . Smoking status: Never Smoker   . Smokeless tobacco: Never Used  . Alcohol Use: No  . Drug Use: No  . Sexual Activity: Not on file   Other Topics Concern  . None   Social History Narrative   Married x 42 yrs.  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   Never smoked. Doesn't drink or use drugs.   Does Patient Exercise:  yes - sometimes      To get diabetes supplies for [name] RD, CDE, April 20,2011   Review of Systems: CONSTITUTIONAL- No Fever, weightloss, night sweat or change in appetite. SKIN- No Rash, colour changes or itching. RESPIRATORY- No Cough or SOB. CARDIAC- No Palpitations, DOE, PND or chest pain. GI- No diarrhoea, abd pain. URINARY- No Frequency, dysuria. NEUROLOGIC- No Numbness, syncope, seizures or burning. [facility]- Denies depression or anxiety.  Objective:  Physical Exam: Filed Vitals:   08/24/14 1341  BP: 135/68  Pulse: 103  Temp: 98.5 F (36.9 C)  TempSrc: Oral  Weight: 156  lb 3.2 oz (70.852 kg)  SpO2: 97%   GENERAL- alert, co-operative, appears as stated age, not in any distress, daughter present. HEENT- Atraumatic, normocephalic. CARDIAC- RRR, no murmurs, rubs or gallops. RESP- No added sounds. ABDOMEN- Soft, nontender, no palpable masses or organomegaly, bowel sounds present. NEURO- No obvious Cr N abnormality EXTREMITIES- pulse 2+, symmetric, no pedal edema. PSYCH- Normal mood and affect, appropriate thought content and speech.  Assessment & Plan:   The patient's case and plan of care was discussed with attending physician, Dr. Ellwood Dense.  Please see problem based charting for assessment and plan.

## 2014-08-24 NOTE — Telephone Encounter (Signed)
I spoke with Stacy.  I am not sure what happened to the BMP.  I called the main [facility] number and they do not see in their system that the lab was drawn, but on my screen it shows that some action was taken at 11:49 today.  We will await results tonight.  If a BMP was not drawn, patient will be notified to obtain a stat bun and creat in the am before her CT.

## 2014-08-24 NOTE — Assessment & Plan Note (Signed)
BP Readings from Last 3 Encounters:  08/24/14 135/68  06/29/14 128/80  04/29/14 120/64    Lab Results  Component Value Date   NA 138 03/05/2014   K 4.2 03/05/2014   CREATININE 1.03 03/05/2014    Assessment: Blood pressure control:  Controlled Progress toward BP goal:   At goal Comments: Compliant with metformin 1000mg  BID, has been having 3-4 diarrheal episodes everyday since she has been on Metformin. She has been working on her diet and Some exercise.   Plan: Medications:  Discont metformin.  Educational resources provided: brochure Self management tools provided:   Other plans: Pt has had ~40 Lb weight loss since January. Her insulin requirements have drastically reduced, will not be continuing any hypoglycemic agents for now. - Cont with diet modification - Follow up in 4- 6 weeks, to ensure weight does not continue to drop. - If weight drops consider work up for [facility], ? Cardiac cachexia.

## 2014-08-24 NOTE — Assessment & Plan Note (Signed)
Now on Lipitor 80mg  daily.

## 2014-08-24 NOTE — Progress Notes (Signed)
Internal Medicine Clinic Attending  Case discussed with Dr. Denton Brick at the time of the visit.  We reviewed the resident's history and exam and pertinent patient test results.  I agree with the assessment, diagnosis, and plan of care documented in the resident's note.  Remarkable weight loss of about 40 lbs since February 2016 noted. Per patient - strict diet regimen for weight loss and metformin related diarrhea seems to be responsible. We will stop metformin and follow up on weight in 1 month. If continued weight loss, consider appropriate malignancy work up. Mammogram ordered today.

## 2014-08-24 NOTE — Assessment & Plan Note (Addendum)
Last Hgb- 10.7 in March thought to be related to surgical blood loss. Anemia panel in January 2016- ferritin- 225, Serum Iron- 63, TIBC- 206- not suggestive of iron deficiency. On iron tablets. Likely anemia of chronic illness- heart failure.  Plan- CBC today, consider discont of iron supplimentation if no improvement.  Addendum- CBC, Hgb still low despite compliance-10.5. Likely Anemia of chronic dx, d/c iron suppliments, anemia panel next visit. Follow up in 6-8 weeks on weightloss. Patient informed.

## 2014-08-25 ENCOUNTER — Ambulatory Visit (INDEPENDENT_AMBULATORY_CARE_PROVIDER_SITE_OTHER)
Admission: RE | Admit: 2014-08-25 | Discharge: 2014-08-25 | Disposition: A | Discharge status: Home / Self Care | Payer: BC Managed Care – PPO | Source: Ambulatory Visit | Attending: Cardiology | Admitting: Cardiology

## 2014-08-25 DIAGNOSIS — R931 Abnormal findings on diagnostic imaging of heart and coronary circulation: Secondary | ICD-10-CM

## 2014-08-25 LAB — CBC
HEMATOCRIT: 33 % — AB (ref 36.0–46.0)
HEMOGLOBIN: 10.5 g/dL — AB (ref 12.0–15.0)
MCH: 27.3 pg (ref 26.0–34.0)
MCHC: 31.8 g/dL (ref 30.0–36.0)
MCV: 85.9 fL (ref 78.0–100.0)
MPV: 11.6 fL (ref 8.6–12.4)
Platelets: 196 10*3/uL (ref 150–400)
RBC: 3.84 MIL/uL — AB (ref 3.87–5.11)
RDW: 17.1 % — ABNORMAL HIGH (ref 11.5–15.5)
WBC: 7.1 10*3/uL (ref 4.0–10.5)

## 2014-08-25 LAB — BASIC METABOLIC PANEL WITH GFR
BUN: 16 mg/dL (ref 7–25)
CALCIUM: 9.5 mg/dL (ref 8.6–10.4)
CHLORIDE: 106 meq/L (ref 98–110)
CO2: 23 meq/L (ref 20–31)
Creat: 0.85 mg/dL (ref 0.50–0.99)
GFR, EST NON AFRICAN AMERICAN: 73 mL/min (ref >=60)
GFR, Est African American: 84 mL/min (ref >=60)
Glucose, Bld: 85 mg/dL (ref 65–99)
Potassium: 4.7 mEq/L (ref 3.5–5.3)
Sodium: 139 mEq/L (ref 135–146)

## 2014-08-31 LAB — HM DIABETES EYE EXAM

## 2014-09-01 ENCOUNTER — Telehealth (HOSPITAL_COMMUNITY): Payer: Self-pay | Admitting: Cardiac Rehabilitation

## 2014-09-01 NOTE — Telephone Encounter (Signed)
Multiple attempts to contact pt to enroll in outpatient cardiac rehab.  3/1/6Spoke to pt dtr who stated transportation concerned.  [employer] was called on pt behalf.  They mailed pt application.  3/10/16Pt daughter reported they received the application and plan to take to doctor appt for completion.  Pt or pt daughter did not return phone calls or letter mailed after initial transportation conversation.

## 2014-09-16 ENCOUNTER — Other Ambulatory Visit: Payer: Self-pay | Admitting: *Deleted

## 2014-09-17 MED ORDER — LISINOPRIL 10 MG PO TABS: 10.0000 mg | ORAL_TABLET | Freq: Every day | ORAL | Status: DC

## 2014-10-12 ENCOUNTER — Ambulatory Visit (INDEPENDENT_AMBULATORY_CARE_PROVIDER_SITE_OTHER): Payer: BC Managed Care – PPO | Admitting: Internal Medicine

## 2014-10-12 ENCOUNTER — Encounter: Payer: Self-pay | Admitting: Internal Medicine

## 2014-10-12 VITALS — BP 156/79 | HR 86 | Temp 99.0°F | Ht 62.0 in | Wt 159.1 lb

## 2014-10-12 DIAGNOSIS — I255 Ischemic cardiomyopathy: Secondary | ICD-10-CM

## 2014-10-12 DIAGNOSIS — Z0000 Encounter for general adult medical examination without abnormal findings: Secondary | ICD-10-CM | POA: Diagnosis not present

## 2014-10-12 DIAGNOSIS — Z23 Encounter for immunization: Secondary | ICD-10-CM | POA: Diagnosis not present

## 2014-10-12 DIAGNOSIS — E119 Type 2 diabetes mellitus without complications: Secondary | ICD-10-CM | POA: Diagnosis not present

## 2014-10-12 LAB — GLUCOSE, CAPILLARY: Glucose-Capillary: 101 mg/dL — ABNORMAL HIGH (ref 65–99)

## 2014-10-12 MED ORDER — FUROSEMIDE 20 MG PO TABS: 20.0000 mg | ORAL_TABLET | Freq: Every day | ORAL | Status: DC

## 2014-10-12 MED ORDER — LISINOPRIL 10 MG PO TABS: 20.0000 mg | ORAL_TABLET | Freq: Every day | ORAL | Status: DC

## 2014-10-12 NOTE — Assessment & Plan Note (Addendum)
Pt due for repeat Colonoscopy. Will help pt make appointment. Flu shot today. Also to arrange to get mammogram- transportation issues. Urine micro-alb today

## 2014-10-12 NOTE — Patient Instructions (Signed)
We have increased the dose of your lisinopril to 20mg  once a day.  We have also started you on a fluid pill called Lasix- Take one tablet once a day.   You need to get a repeat colonoscopy.   We will like to see you in 4 weeks.

## 2014-10-12 NOTE — Assessment & Plan Note (Signed)
Off metformin. Weight stable- increased form 156 last visit to 159. Diarrhea has stopped.

## 2014-10-12 NOTE — Assessment & Plan Note (Signed)
Bp elevated today-156/79. Does not check Bp at home. Bp last visit- 135/68. Also has microalbuminuria. Complaint with meds- Aspirin, Statin, Coreg 12.5 and lisinopril 10. Pt has pitting edema to mid chins today which is new, daughter says she also noticed it yesterday. Pt uses one pillow to sleep and has same chronic dyspnea with exertion that has not changed. Weight 159 today, was 156 last visit- ?diarrhea that has stopped or Fluid weight.  Plan- Will increase Lisinopril to 20mg  daily - Start lasix- 20mg  daily - See in one month. - Will increase coreg to 25mg  BID next visit if pulse and blood pressure tolerates.

## 2014-10-12 NOTE — Progress Notes (Signed)
Patient ID: BREKYN HUNTOON, female   DOB: 10-12-1950, 64 y.o.   MRN: [id number]   Subjective:   Patient ID: MAYBREE RILING female   DOB: 10/20/1950 64 y.o.   MRN: [id number]  HPI: Ms.Hamda L Mcgaughey is a 64 y.o. with PMH listed below. Presented today for follow up of HTn and Diabetes and CHF. No complaints today.  Past Medical History  Diagnosis Date  . Diabetes mellitus 1998    Dx in 1998. Microalbuminuria, never on insulin.  Marland Kitchen GERD (gastroesophageal reflux disease)   . Hyperlipidemia   . Hypertension   . History of blurry vision 06/09    Hospitalized for this  . Personal history of colonic adenomas 10/07/2007  . Pneumonia 01/2014   Current Outpatient Prescriptions  Medication Sig Dispense Refill  . aspirin EC 325 MG EC tablet Take 1 tablet (325 mg total) by mouth daily. 30 tablet 0  . atorvastatin (LIPITOR) 80 MG tablet Take 1 tablet (80 mg total) by mouth daily. 90 tablet 3  . blood glucose meter kit and supplies KIT Dispense based on patient and insurance preference. Use up to four times daily as directed. (FOR ICD-9 250.00, 250.01). 1 each 0  . carvedilol (COREG) 12.5 MG tablet Take 1 tablet (12.5 mg total) by mouth 2 (two) times daily with a meal. 180 tablet 3  . glucose blood (ACCU-CHEK ACTIVE STRIPS) test strip Use as instructed 100 each 12  . LANCETS MICRO THIN 33G MISC 1 Units by Does not apply route daily. 100 each 12  . lisinopril (PRINIVIL,ZESTRIL) 10 MG tablet Take 1 tablet (10 mg total) by mouth daily. 90 tablet 0   No current facility-administered medications for this visit.   Family History  Problem Relation Age of Onset  . Diabetes Mother     Mother died of MI at age 73  . Heart failure Mother   . Kidney failure Mother   . Heart failure Sister     Died  . Heart failure Brother     Died  . Diabetes Brother     Died   Social History   Social History  . Marital Status: Widowed    Spouse Name: N/A  . Number of Children: N/A  . Years of Education: N/A     Social History Main Topics  . Smoking status: Never Smoker   . Smokeless tobacco: Never Used  . Alcohol Use: No  . Drug Use: No  . Sexual Activity: Not Asked   Other Topics Concern  . None   Social History Narrative   Married x 42 yrs.  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   Never smoked. Doesn't drink or use drugs.   Does Patient Exercise:  yes - sometimes      To get diabetes supplies for [name] RD, CDE, April 20,2011   Review of Systems: CONSTITUTIONAL- No Fever, good appetite SKIN- No Rash, colour changes or itching. HEAD- No Headache or dizziness. RESPIRATORY- No Cough or SOB. CARDIAC- No Palpitations, or chest pain. GI- No abd pain, has mild constipation. URINARY- No Frequency or dysuria.  Objective:  Physical Exam: Filed Vitals:   10/12/14 1602  BP: 156/79  Pulse: 86  Temp: 99 F (37.2 C)  TempSrc: Oral  Height: _0  (1.575 m)  Weight: 159 lb 1.6 oz (72.167 kg)  SpO2: 100%   GENERAL- alert, co-operative, appears as stated age, not in any distress. HEENT- Atraumatic, normocephalic, PERRL,  oral mucosa appears moist CARDIAC- RRR, no murmurs, rubs or gallops. RESP- Moving equal volumes of air, and clear to auscultation bilaterally, no wheezes or crackles. ABDOMEN- Soft, nontender, bowel sounds present. NEURO- No obvious Cr N abnormality,  Gait- Normal. EXTREMITIES- pulse 2+, symmetric, has +1 pitting pedal edema to mid chins. SKIN- Warm, dry, No rash or lesion. PSYCH- Normal mood and affect, appropriate thought content and speech.  Assessment & Plan:   The patient's case and plan of care was discussed with attending physician, Dr. Lynnae January.  Please see problem based charting for assessment and plan.

## 2014-10-12 NOTE — Assessment & Plan Note (Signed)
Anemia panel next visit.

## 2014-10-12 NOTE — Assessment & Plan Note (Signed)
BP Readings from Last 3 Encounters:  10/12/14 156/79  08/24/14 135/68  06/29/14 128/80    Lab Results  Component Value Date   NA 139 08/24/2014   K 4.7 08/24/2014   CREATININE 0.85 08/24/2014    Assessment: Blood pressure control:  Elevated.  Plan: Medications:  Increase lisinopril to 20mg  daily

## 2014-10-13 LAB — MICROALBUMIN / CREATININE URINE RATIO
Creatinine, Urine: 128.1 mg/dL
MICROALB/CREAT RATIO: 1980.2 mg/g creat — ABNORMAL HIGH (ref 0.0–30.0)
MICROALBUM., U, RANDOM: 2536.6 ug/mL

## 2014-10-13 NOTE — Progress Notes (Signed)
Internal Medicine Clinic Attending  Case discussed with Dr. Emokpae soon after the resident saw the patient.  We reviewed the resident's history and exam and pertinent patient test results.  I agree with the assessment, diagnosis, and plan of care documented in the resident's note. 

## 2014-11-09 ENCOUNTER — Ambulatory Visit (INDEPENDENT_AMBULATORY_CARE_PROVIDER_SITE_OTHER): Payer: BC Managed Care – PPO | Admitting: Internal Medicine

## 2014-11-09 ENCOUNTER — Encounter: Payer: Self-pay | Admitting: Internal Medicine

## 2014-11-09 VITALS — BP 133/69 | HR 96 | Temp 98.5°F | Ht 62.0 in | Wt 156.4 lb

## 2014-11-09 DIAGNOSIS — E119 Type 2 diabetes mellitus without complications: Secondary | ICD-10-CM | POA: Diagnosis not present

## 2014-11-09 DIAGNOSIS — I255 Ischemic cardiomyopathy: Secondary | ICD-10-CM | POA: Diagnosis not present

## 2014-11-09 DIAGNOSIS — D649 Anemia, unspecified: Secondary | ICD-10-CM

## 2014-11-09 DIAGNOSIS — Z0000 Encounter for general adult medical examination without abnormal findings: Secondary | ICD-10-CM

## 2014-11-09 DIAGNOSIS — I10 Essential (primary) hypertension: Secondary | ICD-10-CM

## 2014-11-09 MED ORDER — LISINOPRIL 20 MG PO TABS: 20.0000 mg | ORAL_TABLET | Freq: Every day | ORAL | Status: DC

## 2014-11-09 MED ORDER — CARVEDILOL 25 MG PO TABS: 25.0000 mg | ORAL_TABLET | Freq: Two times a day (BID) | ORAL | Status: DC

## 2014-11-09 NOTE — Assessment & Plan Note (Signed)
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 Suggested by Anemia panel in January 2016- ferritin- 225, Serum Iron- 63, TIBC- 206- not suggestive of iron deficiency.  - Cont to monitor

## 2014-11-09 NOTE — Progress Notes (Signed)
Patient ID: [name], female   DOB: 06-15-1950, 64 y.o.   MRN: [id number]   Subjective:   Patient ID: [name] female   DOB: 19-Apr-1950 64 y.o.   MRN: [id number]  HPI: [name] is a 64 y.o. with PMH listed below. Presented today for follow up on her chronic medical conditions. Please see assessment and plan for status on chronic medical conditions.   Past Medical History  Diagnosis Date  . Diabetes mellitus 1998    Dx in 1998. Microalbuminuria, never on insulin.  Marland Kitchen GERD (gastroesophageal reflux disease)   . Hyperlipidemia   . Hypertension   . History of blurry vision 06/09    Hospitalized for this  . Personal history of colonic adenomas 10/07/2007  . Pneumonia 01/2014   Current Outpatient Prescriptions  Medication Sig Dispense Refill  . aspirin EC 325 MG EC tablet Take 1 tablet (325 mg total) by mouth daily. 30 tablet 0  . atorvastatin (LIPITOR) 80 MG tablet Take 1 tablet (80 mg total) by mouth daily. 90 tablet 3  . blood glucose meter kit and supplies KIT Dispense based on patient and insurance preference. Use up to four times daily as directed. (FOR ICD-9 250.00, 250.01). 1 each 0  . carvedilol (COREG) 12.5 MG tablet Take 1 tablet (12.5 mg total) by mouth 2 (two) times daily with a meal. 180 tablet 3  . furosemide (LASIX) 20 MG tablet Take 1 tablet (20 mg total) by mouth daily. 30 tablet 1  . glucose blood (ACCU-CHEK ACTIVE STRIPS) test strip Use as instructed 100 each 12  . LANCETS MICRO THIN 33G MISC 1 Units by Does not apply route daily. 100 each 12  . lisinopril (PRINIVIL,ZESTRIL) 10 MG tablet Take 2 tablets (20 mg total) by mouth daily. 90 tablet 0   No current facility-administered medications for this visit.   Family History  Problem Relation Age of Onset  . Diabetes Mother     Mother died of MI at age 60  . Heart failure Mother   . Kidney failure Mother   . Heart failure Sister     Died  . Heart failure Brother     Died  . Diabetes Brother       Died   Social History   Social History  . Marital Status: Widowed    Spouse Name: N/A  . Number of Children: N/A  . Years of Education: N/A   Social History Main Topics  . Smoking status: Never Smoker   . Smokeless tobacco: Never Used  . Alcohol Use: No  . Drug Use: No  . Sexual Activity: Not Asked   Other Topics Concern  . None   Social History Narrative   Married x 42 yrs.  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   Never smoked. Doesn't drink or use drugs.   Does Patient Exercise:  yes - sometimes      To get diabetes supplies for [name] RD, CDE, April 20,2011   Review of Systems: CONSTITUTIONAL- No Fever, weightloss, night sweat or change in appetite. SKIN- No Rash, colour changes or itching. HEAD- No Headache or dizziness. Mouth/throat- No Sorethroat, dentures, or bleeding gums. RESPIRATORY- No Cough or SOB. CARDIAC- No Palpitations, DOE, PND or chest pain. GI- No diarrhoea, constipation, abd pain. URINARY- No Frequency, or dysuria. NEUROLOGIC- No Numbness or burning. [facility]- Denies depression or anxiety.  Objective:  Physical Exam: Filed Vitals:   11/09/14 1328  BP: 160/90  Pulse: 102  Temp: 98.5 F (36.9 C)  TempSrc: Oral  Height: _0  (1.575 m)  Weight: 156 lb 6.4 oz (70.943 kg)  SpO2: 100%   GENERAL- alert, co-operative, appears as stated age, not in any distress. HEENT- Atraumatic, normocephalic, PERRL, EOMI, oral mucosa appears moist, neck supple. CARDIAC- RRR, no murmurs, rubs or gallops. RESP- Moving equal volumes of air, and clear to auscultation bilaterally, no wheezes or crackles. ABDOMEN- Soft, nontender, bowel sounds present. NEURO- No obvious Cr N abnormality, strenght upper and lower extremities- 5/5,Gait- Normal. EXTREMITIES- pulse 2+, symmetric, trace pitting pedal edema. SKIN- Warm, dry, No rash or lesion. PSYCH- Normal mood and affect, appropriate thought content  and speech.  Assessment & Plan:   The patient's case and plan of care was discussed with attending physician, Dr. Dareen Piano.  Please see problem based charting for assessment and plan.

## 2014-11-09 NOTE — Assessment & Plan Note (Signed)
Bp- 133/69. Will increase coreg to 25mg  BID. Cont Lisinopril 20mg  daily.

## 2014-11-09 NOTE — Patient Instructions (Addendum)
We will be increasing the dose of one of your medication- Coreg or carvedilol, to 25mg  two times a day. So take 2 tablets of the one you presently have till you run out, then take one tablet of your next one.   Also continue taking the water pill.  We will call you about the colonoscopy and Mammogram.

## 2014-11-09 NOTE — Assessment & Plan Note (Signed)
Pt doing well today. Bp- 133/69. Pulse of 96. Pedal edema has improved. She is says she can comfortably sleep using one pillow. She has no SOB. She has not yet followed up with her cardiologist. She might need ICD placement if her EF fails to improve. Last ECHO- 07/29/2014 still with EF- 25-30%.   Plan- Will increase Coreg to 25mg  BID. - will bring Bp log next visit, pt cautioned about dizziness - Cont Lasix 20mg  daily, has helped with edema - Cont lisinopril 20mg  daily, consider increasing at next visit as tolerated. - Bmet next visit. - See in 4-6 weeks.

## 2014-11-09 NOTE — Assessment & Plan Note (Signed)
Mammogram and Colonoscopy ordered.

## 2014-11-09 NOTE — Assessment & Plan Note (Signed)
Hgba1c - 07/2014- 5.7. Not on meds. No need to check CBGs. Cont to follow.

## 2014-11-13 NOTE — Progress Notes (Signed)
Internal Medicine Clinic Attending  Case discussed with Dr. Emokpae soon after the resident saw the patient.  We reviewed the resident's history and exam and pertinent patient test results.  I agree with the assessment, diagnosis, and plan of care documented in the resident's note. 

## 2014-11-16 ENCOUNTER — Encounter: Payer: BC Managed Care – PPO | Admitting: Internal Medicine

## 2014-12-15 ENCOUNTER — Other Ambulatory Visit: Payer: Self-pay

## 2014-12-15 DIAGNOSIS — I255 Ischemic cardiomyopathy: Secondary | ICD-10-CM

## 2014-12-16 MED ORDER — FUROSEMIDE 20 MG PO TABS: 20.0000 mg | ORAL_TABLET | Freq: Every day | ORAL | Status: DC

## 2014-12-21 ENCOUNTER — Ambulatory Visit
Admission: RE | Admit: 2014-12-21 | Discharge: 2014-12-21 | Disposition: A | Discharge status: Home / Self Care | Payer: BC Managed Care – PPO | Source: Ambulatory Visit | Attending: Internal Medicine | Admitting: Internal Medicine

## 2014-12-21 ENCOUNTER — Encounter: Payer: Self-pay | Admitting: Internal Medicine

## 2014-12-21 ENCOUNTER — Encounter: Payer: Self-pay | Admitting: Student

## 2014-12-21 ENCOUNTER — Ambulatory Visit (INDEPENDENT_AMBULATORY_CARE_PROVIDER_SITE_OTHER): Payer: BC Managed Care – PPO | Admitting: Internal Medicine

## 2014-12-21 VITALS — BP 141/80 | HR 80 | Temp 98.1°F | Ht 62.0 in | Wt 156.6 lb

## 2014-12-21 DIAGNOSIS — I10 Essential (primary) hypertension: Secondary | ICD-10-CM

## 2014-12-21 DIAGNOSIS — E1169 Type 2 diabetes mellitus with other specified complication: Secondary | ICD-10-CM

## 2014-12-21 DIAGNOSIS — R809 Proteinuria, unspecified: Secondary | ICD-10-CM

## 2014-12-21 DIAGNOSIS — I255 Ischemic cardiomyopathy: Secondary | ICD-10-CM

## 2014-12-21 DIAGNOSIS — Z0000 Encounter for general adult medical examination without abnormal findings: Secondary | ICD-10-CM

## 2014-12-21 DIAGNOSIS — E119 Type 2 diabetes mellitus without complications: Secondary | ICD-10-CM

## 2014-12-21 LAB — GLUCOSE, CAPILLARY: GLUCOSE-CAPILLARY: 87 mg/dL (ref 65–99)

## 2014-12-21 LAB — POCT GLYCOSYLATED HEMOGLOBIN (HGB A1C): HEMOGLOBIN A1C: 7

## 2014-12-21 MED ORDER — LISINOPRIL 40 MG PO TABS: 40.0000 mg | ORAL_TABLET | Freq: Every day | ORAL | Status: DC

## 2014-12-21 NOTE — Progress Notes (Signed)
Patient ID: [name], female   DOB: 09/13/1950, 64 y.o.   MRN: [id number]   Subjective:   Patient ID: [name] female   DOB: Apr 07, 1950 64 y.o.   MRN: [id number]  HPI: [name] is a 64 y.o. female with a past medical history listed below here today for follow up of her Cardiomyopathy and HTN.  For details of today's visit and the status of her chronic medical issues please refer to the assessment and plan.  Past Medical History  Diagnosis Date  . Diabetes mellitus 1998    Dx in 1998. Microalbuminuria, never on insulin.  Marland Kitchen GERD (gastroesophageal reflux disease)   . Hyperlipidemia   . Hypertension   . History of blurry vision 06/09    Hospitalized for this  . Personal history of colonic adenomas 10/07/2007  . Pneumonia 01/2014   Current Outpatient Prescriptions  Medication Sig Dispense Refill  . aspirin EC 325 MG EC tablet Take 1 tablet (325 mg total) by mouth daily. 30 tablet 0  . atorvastatin (LIPITOR) 80 MG tablet Take 1 tablet (80 mg total) by mouth daily. 90 tablet 3  . blood glucose meter kit and supplies KIT Dispense based on patient and insurance preference. Use up to four times daily as directed. (FOR ICD-9 250.00, 250.01). 1 each 0  . carvedilol (COREG) 25 MG tablet Take 1 tablet (25 mg total) by mouth 2 (two) times daily with a meal. 90 tablet 2  . furosemide (LASIX) 20 MG tablet Take 1 tablet (20 mg total) by mouth daily. 30 tablet 2  . glucose blood (ACCU-CHEK ACTIVE STRIPS) test strip Use as instructed 100 each 12  . LANCETS MICRO THIN 33G MISC 1 Units by Does not apply route daily. 100 each 12  . lisinopril (PRINIVIL,ZESTRIL) 40 MG tablet Take 1 tablet (40 mg total) by mouth daily. 30 tablet 1   No current facility-administered medications for this visit.   Family History  Problem Relation Age of Onset  . Diabetes Mother     Mother died of MI at age 40  . Heart failure Mother   . Kidney failure Mother   . Heart failure Sister     Died  .  Heart failure Brother     Died  . Diabetes Brother     Died   Social History   Social History  . Marital Status: Widowed    Spouse Name: N/A  . Number of Children: N/A  . Years of Education: N/A   Social History Main Topics  . Smoking status: Never Smoker   . Smokeless tobacco: Never Used  . Alcohol Use: No  . Drug Use: No  . Sexual Activity: Not Asked   Other Topics Concern  . None   Social History Narrative   Married x 42 yrs.  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   Never smoked. Doesn't drink or use drugs.   Does Patient Exercise:  yes - sometimes      To get diabetes supplies for [name] RD, CDE, April 20,2011   Review of Systems: Review of Systems  Constitutional: Negative for fever and chills.  Respiratory: Negative for cough and shortness of breath.   Cardiovascular: Negative for chest pain, palpitations, orthopnea, leg swelling and PND.  Genitourinary: Negative for dysuria and frequency.  Neurological: Negative for dizziness and headaches.  Endo/Heme/Allergies: Negative for polydipsia.   Objective:  Physical Exam: Filed Vitals:   12/21/14  1415 12/21/14 1443  BP: 145/87 141/80  Pulse: 81 80  Temp: 98.1 F (36.7 C)   TempSrc: Oral   Height: 5' 2"  (1.575 m)   Weight: 156 lb 9.6 oz (71.033 kg)   SpO2: 100%    PHYSICAL EXAM GENERAL- alert, co-operative, appears as stated age, not in any distress. HEENT- Atraumatic, normocephalic, PERRL, EOMI, oral mucosa appears moist, neck supple. CARDIAC- RRR, no murmurs, rubs or gallops. RESP- Moving equal volumes of air, and clear to auscultation bilaterally, no wheezes or crackles. ABDOMEN- Soft, nontender, bowel sounds present. NEURO- No obvious Cr N abnormality, strenght upper and lower extremities- 5/5,Gait- Normal. EXTREMITIES- pulse 2+, symmetric, no pedal edema SKIN- Warm, dry, No rash or lesion. PSYCH- Normal mood and affect, appropriate  thought content and speech.  Assessment & Plan:   Case discussed with Dr. Daryll Drown. Please refer to Problem based charting for further details of today's visit.

## 2014-12-21 NOTE — Progress Notes (Signed)
HPI: FU CAD; previously admitted with CHF; echo 1/16 showed EF 25-30, mild to moderate MR, mild LAE/RAE/RVE; moderately reduced RV function; small pericardial effusion. Carotid dopplers 1/16 showed 1-39 bilateral stenosis. Cath revealed 3 vessel CAD and EF 30-35. Had CABG with LIMA to LAD, seq svg to first and second diagonal, svg to OM, svg to PDA. Echocardiogram repeated June 2016 and showed ejection fraction 25-30%. Grade 1 diastolic dysfunction, mild mitral regurgitation. There is a density at the IVC and chest CT recommended. CT showed no mass. Patient referred to electrophysiology for consideration of ICD but patient declined. Since last seen, There is no dyspnea, chest pain, palpitations or syncope.  Current Outpatient Prescriptions  Medication Sig Dispense Refill  . aspirin EC 325 MG EC tablet Take 1 tablet (325 mg total) by mouth daily. 30 tablet 0  . atorvastatin (LIPITOR) 80 MG tablet Take 1 tablet (80 mg total) by mouth daily. 90 tablet 3  . blood glucose meter kit and supplies KIT Dispense based on patient and insurance preference. Use up to four times daily as directed. (FOR ICD-9 250.00, 250.01). 1 each 0  . carvedilol (COREG) 25 MG tablet Take 1 tablet (25 mg total) by mouth 2 (two) times daily with a meal. 90 tablet 2  . furosemide (LASIX) 20 MG tablet Take 1 tablet (20 mg total) by mouth daily. 30 tablet 2  . glucose blood (ACCU-CHEK ACTIVE STRIPS) test strip Use as instructed 100 each 12  . LANCETS MICRO THIN 33G MISC 1 Units by Does not apply route daily. 100 each 12  . lisinopril (PRINIVIL,ZESTRIL) 40 MG tablet Take 1 tablet (40 mg total) by mouth daily. 30 tablet 1   No current facility-administered medications for this visit.     Past Medical History  Diagnosis Date  . Diabetes mellitus 1998    Dx in 1998. Microalbuminuria, never on insulin.  Marland Kitchen GERD (gastroesophageal reflux disease)   . Hyperlipidemia   . Hypertension   . History of blurry vision 06/09   Hospitalized for this  . Personal history of colonic adenomas 10/07/2007  . Pneumonia 01/2014    Past Surgical History  Procedure Laterality Date  . Total abdominal hysterectomy w/ bilateral salpingoophorectomy  1996  . Left and right heart catheterization with coronary angiogram N/A 02/26/2014    Procedure: LEFT AND RIGHT HEART CATHETERIZATION WITH CORONARY ANGIOGRAM;  Surgeon: Sinclair Grooms, MD;  Location: [facility] CATH LAB;  Service: Cardiovascular;  Laterality: N/A;  . Coronary artery bypass graft N/A 03/01/2014    Procedure: CORONARY ARTERY BYPASS GRAFTING (CABG) x five, using left internal mammary artery and right leg greater saphenous vein harvested endoscopically;  Surgeon: Grace Isaac, MD;  Location: [facility];  Service: Open Heart Surgery;  Laterality: N/A;  . Intraoperative transesophageal echocardiogram N/A 03/01/2014    Procedure: INTRAOPERATIVE TRANSESOPHAGEAL ECHOCARDIOGRAM;  Surgeon: Grace Isaac, MD;  Location: [facility];  Service: Open Heart Surgery;  Laterality: N/A;    Social History   Social History  . Marital Status: Widowed    Spouse Name: N/A  . Number of Children: N/A  . Years of Education: N/A   Occupational History  . Not on file.   Social History Main Topics  . Smoking status: Never Smoker   . Smokeless tobacco: Never Used  . Alcohol Use: No  . Drug Use: No  . Sexual Activity: Not on file   Other Topics Concern  . Not on file   Social History  Narrative   Married x 42 yrs.  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   Never smoked. Doesn't drink or use drugs.   Does Patient Exercise:  yes - sometimes      To get diabetes supplies for [name] RD, CDE, April 20,2011    ROS: no fevers or chills, productive cough, hemoptysis, dysphasia, odynophagia, melena, hematochezia, dysuria, hematuria, rash, seizure activity, orthopnea, PND, pedal edema, claudication. Remaining systems are  negative.  Physical Exam: Well-developed well-nourished in no acute distress.  Skin is warm and dry.  HEENT is normal.  Neck is supple.  Chest is clear to auscultation with normal expansion.  Cardiovascular exam is regular rate and rhythm.  Abdominal exam nontender or distended. No masses palpated. Extremities show no edema. neuro grossly intact  ECG Sinus rhythm at a rate of 79. Prolonged QT interval.

## 2014-12-21 NOTE — Assessment & Plan Note (Addendum)
A1c trending up at 7.0 today. Currently on no medications.  Significant microalbinuria.  Continue to monitor closely.

## 2014-12-21 NOTE — Patient Instructions (Signed)
Thank you for coming in today  -I am increasing your blood pressure medication today, the Lisinopril from 20 mg to 40 mg. You can take two pills of the 20 mg bottle you have right now every day until it runs out and I have sent a new prescription in for the 40 mg pills that will be 1 pill every day.  -Please follow up with Dr. Stanford Breed on 12/1 -I would like to see you back in clinic in 3 months for a recheck

## 2014-12-22 LAB — BMP8+ANION GAP
ANION GAP: 13 mmol/L (ref 10.0–18.0)
BUN/Creatinine Ratio: 21 (ref 11–26)
BUN: 20 mg/dL (ref 8–27)
CO2: 25 mmol/L (ref 18–29)
CREATININE: 0.96 mg/dL (ref 0.57–1.00)
Calcium: 9.3 mg/dL (ref 8.7–10.3)
Chloride: 103 mmol/L (ref 97–106)
GFR calc Af Amer: 72 mL/min/{1.73_m2} (ref >59)
GFR, EST NON AFRICAN AMERICAN: 63 mL/min/{1.73_m2} (ref >59)
Glucose: 89 mg/dL (ref 65–99)
Potassium: 4.4 mmol/L (ref 3.5–5.2)
SODIUM: 141 mmol/L (ref 136–144)

## 2014-12-22 LAB — MICROALBUMIN / CREATININE URINE RATIO
Creatinine, Urine: 99.9 mg/dL
MICROALB/CREAT RATIO: 1322 mg/g{creat} — AB (ref 0.0–30.0)
MICROALBUM., U, RANDOM: 1320.7 ug/mL

## 2014-12-22 NOTE — Assessment & Plan Note (Signed)
BP Readings from Last 3 Encounters:  12/21/14 141/80  11/09/14 133/69  10/12/14 156/79    Lab Results  Component Value Date   NA 141 12/21/2014   K 4.4 12/21/2014   CREATININE 0.96 12/21/2014    Assessment: BP mildly elevated today at 145/87. Currently on Coreg 25 mg bid and Lisinopril 20 mg daily. She has a large amount of microalbuminuria. Denies any symptoms today.  Plan: Given her large amount of proteinuria and mildly elevated BP today, will increase her lisinopril to 40 mg daily. Rechecking microalbuminuria today.

## 2014-12-22 NOTE — Progress Notes (Signed)
Internal Medicine Clinic Attending  I saw and evaluated the patient.  I personally confirmed the key portions of the history and exam documented by Dr. Boswell and I reviewed pertinent patient test results.  The assessment, diagnosis, and plan were formulated together and I agree with the documentation in the resident's note. 

## 2014-12-22 NOTE — Assessment & Plan Note (Signed)
Reports no complaints today. She denies any edema, orthopnea, PND, SOB. She has an appointment with Dr. Stanford Breed on 12/1. Weight today is stable. BP mildly elevated today.  Will increase Lisinopril to 40 mg daily. BMP today unremarkable Follow up with Dr. Stanford Breed

## 2014-12-30 ENCOUNTER — Ambulatory Visit (INDEPENDENT_AMBULATORY_CARE_PROVIDER_SITE_OTHER): Payer: BC Managed Care – PPO | Admitting: Cardiology

## 2014-12-30 ENCOUNTER — Encounter: Payer: Self-pay | Admitting: Cardiology

## 2014-12-30 VITALS — BP 124/72 | HR 79 | Ht 62.0 in | Wt 159.0 lb

## 2014-12-30 DIAGNOSIS — I10 Essential (primary) hypertension: Secondary | ICD-10-CM

## 2014-12-30 DIAGNOSIS — E785 Hyperlipidemia, unspecified: Secondary | ICD-10-CM | POA: Diagnosis not present

## 2014-12-30 DIAGNOSIS — Z79899 Other long term (current) drug therapy: Secondary | ICD-10-CM

## 2014-12-30 NOTE — Patient Instructions (Signed)
Your physician recommends that you return for lab work at your earliest [place] (nothing to eat after midnight, morning meds with 1 cup of water or black coffee - no cream, no sugar).  Dr Stanford Breed recommends that you schedule a follow-up appointment in 6 months. You will receive a reminder letter in the mail two months in advance. If you don't receive a letter, please call our office to schedule the follow-up appointment.  If you need a refill on your cardiac medications before your next appointment, please call your pharmacy.

## 2014-12-30 NOTE — Assessment & Plan Note (Signed)
Continue aspirin and statin. 

## 2014-12-30 NOTE — Assessment & Plan Note (Signed)
Continue ACE inhibitor and beta blocker. I again reviewed ICD with patient. I explained the risk of sudden cardiac death. She has declined.

## 2014-12-30 NOTE — Assessment & Plan Note (Signed)
Continue statin. Check lipids and liver. 

## 2014-12-30 NOTE — Assessment & Plan Note (Signed)
Blood pressure controlled. Continue present medications. 

## 2014-12-31 LAB — HEPATIC FUNCTION PANEL
ALBUMIN: 3.5 g/dL — AB (ref 3.6–5.1)
ALT: 35 U/L — ABNORMAL HIGH (ref 6–29)
AST: 51 U/L — ABNORMAL HIGH (ref 10–35)
Alkaline Phosphatase: 127 U/L (ref 33–130)
BILIRUBIN TOTAL: 0.2 mg/dL (ref 0.2–1.2)
Bilirubin, Direct: <0.1 mg/dL (ref <=0.2)
Total Protein: 7.2 g/dL (ref 6.1–8.1)

## 2014-12-31 LAB — LIPID PANEL
CHOL/HDL RATIO: 2.6 ratio (ref <=5.0)
CHOLESTEROL: 182 mg/dL (ref 125–200)
HDL: 70 mg/dL (ref >=46)
LDL Cholesterol: 94 mg/dL (ref <130)
Triglycerides: 92 mg/dL (ref <150)
VLDL: 18 mg/dL (ref <30)

## 2015-01-03 ENCOUNTER — Telehealth: Payer: Self-pay | Admitting: *Deleted

## 2015-01-03 DIAGNOSIS — R7989 Other specified abnormal findings of blood chemistry: Secondary | ICD-10-CM

## 2015-01-03 DIAGNOSIS — R945 Abnormal results of liver function studies: Principal | ICD-10-CM

## 2015-01-03 NOTE — Telephone Encounter (Signed)
Spoke with pt dtr, aware of labs and need for repeat blood work in 12 weeks. Orders placed and mailed to pts home address.

## 2015-01-03 NOTE — Telephone Encounter (Signed)
-----   Message from Lelon Perla, MD sent at 01/01/2015  6:18 AM EST ----- Fu lfts 12 weeks Kirk [facility]

## 2015-01-19 ENCOUNTER — Encounter: Payer: Self-pay | Admitting: *Deleted

## 2015-03-04 ENCOUNTER — Telehealth: Payer: Self-pay | Admitting: Internal Medicine

## 2015-03-04 NOTE — Telephone Encounter (Signed)
APPT REMINDER CALL/LMTCB IF SHE NEEDS TO CANCEL °

## 2015-03-07 ENCOUNTER — Ambulatory Visit (INDEPENDENT_AMBULATORY_CARE_PROVIDER_SITE_OTHER): Payer: BC Managed Care – PPO | Admitting: Internal Medicine

## 2015-03-07 ENCOUNTER — Encounter: Payer: Self-pay | Admitting: Internal Medicine

## 2015-03-07 VITALS — BP 148/74 | HR 77 | Temp 97.9°F | Wt 166.9 lb

## 2015-03-07 DIAGNOSIS — I10 Essential (primary) hypertension: Secondary | ICD-10-CM

## 2015-03-07 DIAGNOSIS — I255 Ischemic cardiomyopathy: Secondary | ICD-10-CM | POA: Diagnosis not present

## 2015-03-07 DIAGNOSIS — E119 Type 2 diabetes mellitus without complications: Secondary | ICD-10-CM | POA: Diagnosis not present

## 2015-03-07 MED ORDER — FUROSEMIDE 20 MG PO TABS: 20.0000 mg | ORAL_TABLET | Freq: Every day | ORAL | Status: DC

## 2015-03-07 NOTE — Assessment & Plan Note (Signed)
BP Readings from Last 3 Encounters:  03/07/15 148/74  12/30/14 124/72  12/21/14 141/80    Lab Results  Component Value Date   NA 141 12/21/2014   K 4.4 12/21/2014   CREATININE 0.96 12/21/2014    Assessment: Blood pressure control:  Fair control Other plans: Bmet today, she will start taking her lasix everyday, she has been taking it every other day.

## 2015-03-07 NOTE — Patient Instructions (Addendum)
It was nice seeing you to day.   Please take your water pill- Lasix or furosemide every day.   Also please continue to watch your diet. This is important as I do not want you to get back on medications for diabetes.  This is some information to help you with your diet.     Basic Carbohydrate Counting for Diabetes Mellitus Carbohydrate counting is a method for keeping track of the amount of carbohydrates you eat. Eating carbohydrates naturally increases the level of sugar (glucose) in your blood, so it is important for you to know the amount that is okay for you to have in every meal. Carbohydrate counting helps keep the level of glucose in your blood within normal limits. The amount of carbohydrates allowed is different for every person. A dietitian can help you calculate the amount that is right for you. Once you know the amount of carbohydrates you can have, you can count the carbohydrates in the foods you want to eat. Carbohydrates are found in the following foods:  Grains, such as breads and cereals.  Dried beans and soy products.  Starchy vegetables, such as potatoes, peas, and corn.  Fruit and fruit juices.  Milk and yogurt.  Sweets and snack foods, such as cake, cookies, candy, chips, soft drinks, and fruit drinks. CARBOHYDRATE COUNTING There are two ways to count the carbohydrates in your food. You can use either of the methods or a combination of both. Reading the "Nutrition Facts" on [place] The "Nutrition Facts" is an area that is included on the labels of almost all packaged food and beverages in the [place]. It includes the serving size of that food or beverage and information about the nutrients in each serving of the food, including the grams (g) of carbohydrate per serving.  Decide the number of servings of this food or beverage that you will be able to eat or drink. Multiply that number of servings by the number of grams of carbohydrate that is listed on the  label for that serving. The total will be the amount of carbohydrates you will be having when you eat or drink this food or beverage. Learning Standard Serving Sizes of Food When you eat food that is not packaged or does not include "Nutrition Facts" on the label, you need to measure the servings in order to count the amount of carbohydrates.A serving of most carbohydrate-rich foods contains about 15 g of carbohydrates. The following list includes serving sizes of carbohydrate-rich foods that provide 15 g ofcarbohydrate per serving:   1 slice of bread (1 oz) or 1 six-inch tortilla.    of a hamburger bun or English muffin.  4-6 crackers.   cup unsweetened dry cereal.    cup hot cereal.   cup rice or pasta.    cup mashed potatoes or  of a large baked potato.  1 cup fresh fruit or one small piece of fruit.    cup canned or frozen fruit or fruit juice.  1 cup milk.   cup plain fat-free yogurt or yogurt sweetened with artificial sweeteners.   cup cooked dried beans or starchy vegetable, such as peas, corn, or potatoes.  Decide the number of standard-size servings that you will eat. Multiply that number of servings by 15 (the grams of carbohydrates in that serving). For example, if you eat 2 cups of strawberries, you will have eaten 2 servings and 30 g of carbohydrates (2 servings x 15 g = 30 g). For foods  such as soups and casseroles, in which more than one food is mixed in, you will need to count the carbohydrates in each food that is included. EXAMPLE OF CARBOHYDRATE COUNTING Sample Dinner  3 oz chicken breast.   cup of brown rice.   cup of corn.  1 cup milk.   1 cup strawberries with sugar-free whipped topping.  Carbohydrate Calculation Step 1: Identify the foods that contain carbohydrates:   Rice.   Corn.   Milk.   Strawberries. Step 2:Calculate the number of servings eaten of each:   2 servings of rice.   1 serving of corn.   1 serving  of milk.   1 serving of strawberries. Step 3: Multiply each of those number of servings by 15 g:   2 servings of rice x 15 g = 30 g.   1 serving of corn x 15 g = 15 g.   1 serving of milk x 15 g = 15 g.   1 serving of strawberries x 15 g = 15 g. Step 4: Add together all of the amounts to find the total grams of carbohydrates eaten: 30 g + 15 g + 15 g + 15 g = 75 g.   This information is not intended to replace advice given to you by your health care provider. Make sure you discuss any questions you have with your health care provider.   Document Released: 01/15/2005 Document Revised: 02/05/2014 Document Reviewed: 12/12/2012 Elsevier Interactive Patient Education [employer].

## 2015-03-07 NOTE — Progress Notes (Signed)
Internal Medicine Clinic Attending  Case discussed with Dr. Emokpae soon after the resident saw the patient.  We reviewed the resident's history and exam and pertinent patient test results.  I agree with the assessment, diagnosis, and plan of care documented in the resident's note. 

## 2015-03-07 NOTE — Progress Notes (Signed)
Patient ID: [name], female   DOB: [birth year], 65 y.o.   MRN: [id number]   Subjective:   Patient ID: [name] female   DOB: 06-27-1950 65 y.o.   MRN: [id number]  HPI: [name] is a 65 y.o. with PMH listed below presented for routine follow up of her DM, HTN and heart failure. Please see problem based charting for details.  Past Medical History  Diagnosis Date  . Diabetes mellitus 1998    Dx in 1998. Microalbuminuria, never on insulin.  Marland Kitchen GERD (gastroesophageal reflux disease)   . Hyperlipidemia   . Hypertension   . History of blurry vision 06/09    Hospitalized for this  . Personal history of colonic adenomas 10/07/2007  . Pneumonia 01/2014   Review of Systems: CONSTITUTIONAL- No Fever, weightloss, night sweat or change in appetite. SKIN- No Rash, colour changes or itching. HEAD- No Headache or dizziness. RESPIRATORY- No Cough or SOB. CARDIAC- No Palpitations, or chest pain. GI- No vomiting, diarrhoea, abd pain. URINARY- No Frequency, or dysuria. NEUROLOGIC- No Numbness, or burning. [facility]- Denies depression or anxiety.  Objective:  Physical Exam: Filed Vitals:   03/07/15 0847  BP: 148/74  Pulse: 77  Temp: 97.9 F (36.6 C)  TempSrc: Oral  Weight: 166 lb 14.4 oz (75.705 kg)  SpO2: 100%   GENERAL- alert, co-operative, appears as stated age, not in any distress. HEENT- Atraumatic, normocephalic, PERRL, neck supple. CARDIAC- RRR, no murmurs, rubs or gallops. RESP- Moving equal volumes of air, and no wheezes or crackles. ABDOMEN- Soft, nontender,bowel sounds present. NEURO- No obvious Cr N abnormality, Gait- Normal. EXTREMITIES- pulse 2+, symmetric, +1 pitting pedal edema. SKIN- Warm, dry, No rash or lesion. PSYCH- Normal mood and affect, appropriate thought content and speech.  Assessment & Plan:   The patient's case and plan of care was discussed with attending physician, Dr. Dareen Piano.  Please see problem based charting for assessment and  plan.

## 2015-03-07 NOTE — Assessment & Plan Note (Signed)
Hgba1c today. Pt left. She will be called to come back and get labs done. Her weight has increased slightly while off metformin which caused her significant diarrhea. She might need to be on oral hypoglycemics if Hgba1c increases.

## 2015-03-07 NOTE — Assessment & Plan Note (Addendum)
Has +1 pitting pedal edema to mid knee. Lungs clear. Uses 1 pillow to sleep. Heart failure appears well compensated, she says she does not have SOB when asked number of stairs she can climb or distance she can work. Weight has gone up after d/c metformin that was causing diarrhea, 159- 12/2014 to 166 today. Talked to pt about her decision to not have an ICD she still declines.  Plan- Bp 140s today, can likely tolerate Hydra/imdur or adding spironolactone, will hold for now considering hx of low blood pressure and her heart failure appears well compensated. - Pt has been taking lasix Q48h despite daily prescription, told her she should take it everyday- lasix 20mg  daily.

## 2015-03-21 ENCOUNTER — Other Ambulatory Visit: Payer: Self-pay | Admitting: Internal Medicine

## 2015-04-18 ENCOUNTER — Other Ambulatory Visit: Payer: Self-pay | Admitting: Internal Medicine

## 2015-04-21 ENCOUNTER — Other Ambulatory Visit: Payer: Self-pay | Admitting: Internal Medicine

## 2015-04-21 NOTE — Telephone Encounter (Signed)
Pls call patient back her refills.

## 2015-04-22 NOTE — Telephone Encounter (Signed)
Pt uses [facility]t at [place], called [facility] and had them transferred

## 2015-04-25 LAB — HEPATIC FUNCTION PANEL
ALT: 35 U/L — AB (ref 6–29)
AST: 50 U/L — AB (ref 10–35)
Albumin: 3.4 g/dL — ABNORMAL LOW (ref 3.6–5.1)
Alkaline Phosphatase: 114 U/L (ref 33–130)
TOTAL PROTEIN: 6.9 g/dL (ref 6.1–8.1)
Total Bilirubin: 0.3 mg/dL (ref 0.2–1.2)

## 2015-05-31 NOTE — Addendum Note (Signed)
Addended by: Bethena Roys on: 05/31/2015 01:08 PM   Modules accepted: Orders

## 2015-06-07 ENCOUNTER — Ambulatory Visit (INDEPENDENT_AMBULATORY_CARE_PROVIDER_SITE_OTHER): Payer: Medicare Other | Admitting: Internal Medicine

## 2015-06-07 ENCOUNTER — Encounter: Payer: Self-pay | Admitting: Internal Medicine

## 2015-06-07 VITALS — BP 163/81 | HR 73 | Temp 98.2°F | Ht 62.0 in | Wt 170.4 lb

## 2015-06-07 DIAGNOSIS — I10 Essential (primary) hypertension: Secondary | ICD-10-CM

## 2015-06-07 DIAGNOSIS — E119 Type 2 diabetes mellitus without complications: Secondary | ICD-10-CM

## 2015-06-07 DIAGNOSIS — E785 Hyperlipidemia, unspecified: Secondary | ICD-10-CM

## 2015-06-07 LAB — GLUCOSE, CAPILLARY: GLUCOSE-CAPILLARY: 127 mg/dL — AB (ref 65–99)

## 2015-06-07 LAB — POCT GLYCOSYLATED HEMOGLOBIN (HGB A1C): Hemoglobin A1C: 7.8

## 2015-06-07 MED ORDER — HYDRALAZINE HCL 10 MG PO TABS: 10.0000 mg | ORAL_TABLET | Freq: Three times a day (TID) | ORAL | Status: DC

## 2015-06-07 MED ORDER — ATORVASTATIN CALCIUM 80 MG PO TABS: 80.0000 mg | ORAL_TABLET | Freq: Every day | ORAL | Status: DC

## 2015-06-07 NOTE — Assessment & Plan Note (Signed)
Lab Results  Component Value Date   HGBA1C 7.8 06/07/2015   HGBA1C 7.0 12/21/2014   HGBA1C 5.7 08/24/2014     Assessment: Diabetes control:   Weight increasing, also Hgba1c, [name] has not been complaint with her diabetic diet. [name] has been drinking lots of sugary drinks and eating cookies, and potato chips.  - [name] brought her glucometer- readings fasting- 84- 146. Other plans: Will restart meds at this time, emphasized watching diet - Would not tolerate metformin,  [name] would be at risk for hypoglycemia, as some of her fasting CBGs are in the 80s. So might benefit from meds unlikely to cause hypoglycemia.

## 2015-06-07 NOTE — Progress Notes (Signed)
Patient ID: CHARNAE MUI, female   DOB: 06/17/1950, 65 y.o.   MRN: [id number]   Subjective:   Patient ID: CHANEQUA KASPRZYK female   DOB: [birth year] 65 y.o.   MRN: [id number]  HPI: Ms.Pamalee L Brecker is a 65 y.o. with PMH listed below. Presented to follow up on her DM, HTN and CHF.  Past Medical History  Diagnosis Date  . Diabetes mellitus 1998    Dx in 1998. Microalbuminuria, never on insulin.  Marland Kitchen GERD (gastroesophageal reflux disease)   . Hyperlipidemia   . Hypertension   . History of blurry vision 06/09    Hospitalized for this  . Personal history of colonic adenomas 10/07/2007  . Pneumonia 01/2014   Review of Systems: CONSTITUTIONAL- No Fever, gaining weight HEAD- No Headache or dizziness. RESPIRATORY- No Cough or SOB. CARDIAC- No Palpitations, or chest pain. GI- No vomiting, diarrhoea, abd pain. URINARY- No Frequency, or dysuria. NEUROLOGIC- No Numbness  Objective:  Physical Exam: Filed Vitals:   06/07/15 1350  BP: 163/81  Pulse: 73  Temp: 98.2 F (36.8 C)  TempSrc: Oral  Height: 5\' 2"  (1.575 m)  Weight: 170 lb 6.4 oz (77.293 kg)  SpO2: 100%   GENERAL- alert, co-operative, appears as stated age, not in any distress. HEENT- Atraumatic, normocephalic CARDIAC- RRR, no murmurs, rubs or gallops. RESP- Moving equal volumes of air, and clear to auscultation bilaterally, no wheezes or crackles. ABDOMEN- Soft, nontender, no guarding or rebound,  EXTREMITIES- pulse 2+, symmetric, trace to +1 pitting edema, bilat. SKIN- Warm, dry, No rash or lesion. PSYCH- Normal mood and affect, appropriate thought content and speech.  Assessment & Plan:  The patient's case and plan of care was discussed with attending physician, Dr. Dareen Piano.  Please see problem based charting for assessment and plan.

## 2015-06-07 NOTE — Assessment & Plan Note (Signed)
DEXA scan and Prevnar-13, later 2nd Pneumococcal vaccine- after patient gets [employer] established.

## 2015-06-07 NOTE — Assessment & Plan Note (Signed)
BP Readings from Last 3 Encounters:  06/07/15 163/81  03/07/15 148/74  12/30/14 124/72    Lab Results  Component Value Date   NA 141 12/21/2014   K 4.4 12/21/2014   CREATININE 0.96 12/21/2014    Assessment: Blood pressure control:  Uncontrolled Comments: Complaint with Coreg- 25, Lisinopril- 40, and on Lasix- 20mg  daily. She has minimal pedal edema, but no crackles, no SOB, she has gradually increased her activity.   Plan: Medications:  Will start hydralazine- 10mg  TID, as she has significant heart failue- EF- 25- 305 2016. If tolerates well consider adding Imdur . If not copmpliant with hydralazine consider increasing dose of lasix to 40mg  daily, but will need to check labs first. Other plans: Bmet next visit, she has no insurance and is about to get Medicare, so will not oder now.

## 2015-06-07 NOTE — Patient Instructions (Signed)
I have started a new medication called Hydralazine- Take 1 tablet three times a day.  Please watch your diet, as your Hgba1c is increasing and we do not want you to start you on Diabetic medications again. It is very important you cut out sugary drinks and snacks.   We will see you in 1 month, to check your Blood pressure and do some blood work.

## 2015-06-08 NOTE — Progress Notes (Signed)
Internal Medicine Clinic Attending  Case discussed with Dr. Emokpae at the time of the visit.  We reviewed the resident's history and exam and pertinent patient test results.  I agree with the assessment, diagnosis, and plan of care documented in the resident's note.  

## 2015-06-23 ENCOUNTER — Other Ambulatory Visit: Payer: Self-pay | Admitting: Internal Medicine

## 2015-07-04 ENCOUNTER — Other Ambulatory Visit: Payer: Self-pay | Admitting: *Deleted

## 2015-07-04 DIAGNOSIS — I255 Ischemic cardiomyopathy: Secondary | ICD-10-CM

## 2015-07-05 MED ORDER — FUROSEMIDE 20 MG PO TABS: 20.0000 mg | ORAL_TABLET | Freq: Every day | ORAL | Status: DC

## 2015-07-15 ENCOUNTER — Encounter: Payer: Self-pay | Admitting: *Deleted

## 2015-08-03 NOTE — Progress Notes (Signed)
HPI: FU CAD; previously admitted with CHF; echo 1/16 showed EF 25-30, mild to moderate MR, mild LAE/RAE/RVE; moderately reduced RV function; small pericardial effusion. Carotid dopplers 1/16 showed 1-39 bilateral stenosis. Cath revealed 3 vessel CAD and EF 30-35. Had CABG with LIMA to LAD, seq svg to first and second diagonal, svg to OM, svg to PDA. 65 Echocardiogram repeated June 2016 and showed ejection fraction 25-30%. Grade 1 diastolic dysfunction, mild mitral regurgitation. There is a density at the IVC and chest CT recommended. CT showed no mass. Patient referred to electrophysiology for consideration of ICD but patient declined. Since last seen, She ran out of her Lasix for 65 weeks. She has been back on it for 2 weeks now. She has dyspnea with moderate activities. No orthopnea or PND. She has developed pedal edema. She denies chest pain or syncope.  Current Outpatient Prescriptions  Medication Sig Dispense Refill  . aspirin EC 325 MG EC tablet Take 1 tablet (325 mg total) by mouth daily. 30 tablet 0  . atorvastatin (LIPITOR) 80 MG tablet Take 1 tablet (80 mg total) by mouth daily. 90 tablet 3  . blood glucose meter kit and supplies KIT Dispense based on patient and insurance preference. Use up to four times daily as directed. (FOR ICD-9 250.00, 250.01). 1 each 0  . carvedilol (COREG) 25 MG tablet TAKE 1 TABLET (25 MG TOTAL) BY MOUTH 2 (TWO) TIMES DAILY WITH A MEAL. 180 tablet 1  . furosemide (LASIX) 20 MG tablet Take 1 tablet (20 mg total) by mouth daily. 30 tablet 2  . glucose blood (ACCU-CHEK ACTIVE STRIPS) test strip Use as instructed 100 each 12  . hydrALAZINE (APRESOLINE) 10 MG tablet Take 1 tablet (10 mg total) by mouth 3 (three) times daily. 90 tablet 0  . LANCETS MICRO THIN 33G MISC 1 Units by Does not apply route daily. 100 each 12  . lisinopril (PRINIVIL,ZESTRIL) 40 MG tablet TAKE 1 TABLET (40 MG TOTAL) BY MOUTH DAILY. 30 tablet 5   No current facility-administered medications  for this visit.     Past Medical History  Diagnosis Date  . Diabetes mellitus 1998    Dx in 1998. Microalbuminuria, never on insulin.  Marland Kitchen GERD (gastroesophageal reflux disease)   . Hyperlipidemia   . Hypertension   . History of blurry vision 06/09    Hospitalized for this  . Personal history of colonic adenomas 10/07/2007  . Pneumonia 01/2014    Past Surgical History  Procedure Laterality Date  . Total abdominal hysterectomy w/ bilateral salpingoophorectomy  1996  . Left and right heart catheterization with coronary angiogram N/A 02/26/2014    Procedure: LEFT AND RIGHT HEART CATHETERIZATION WITH CORONARY ANGIOGRAM;  Surgeon: Sinclair Grooms, MD;  Location: [facility] CATH LAB;  Service: Cardiovascular;  Laterality: N/A;  . Coronary artery bypass graft N/A 03/01/2014    Procedure: CORONARY ARTERY BYPASS GRAFTING (CABG) x five, using left internal mammary artery and right leg greater saphenous vein harvested endoscopically;  Surgeon: Grace Isaac, MD;  Location: [facility];  Service: Open Heart Surgery;  Laterality: N/A;  . Intraoperative transesophageal echocardiogram N/A 03/01/2014    Procedure: INTRAOPERATIVE TRANSESOPHAGEAL ECHOCARDIOGRAM;  Surgeon: Grace Isaac, MD;  Location: [place];  Service: Open Heart Surgery;  Laterality: N/A;    Social History   Social History  . Marital Status: Widowed    Spouse Name: N/A  . Number of Children: N/A  . Years of Education: N/A   Occupational History  .  Not on file.   Social History Main Topics  . Smoking status: Never Smoker   . Smokeless tobacco: Never Used  . Alcohol Use: No  . Drug Use: No  . Sexual Activity: Not on file   Other Topics Concern  . Not on file   Social History Narrative   Married x 42 yrs.  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   Never smoked. Doesn't drink or use drugs.   Does Patient Exercise:  yes - sometimes      To get diabetes supplies for [name] RD, CDE, April 20,2011    Family History  Problem Relation Age of Onset  . Diabetes Mother     Mother died of MI at age 62  . Heart failure Mother   . Kidney failure Mother   . Heart failure Sister     Died  . Heart failure Brother     Died  . Diabetes Brother     Died    ROS: no fevers or chills, productive cough, hemoptysis, dysphasia, odynophagia, melena, hematochezia, dysuria, hematuria, rash, seizure activity, orthopnea, PND, claudication. Remaining systems are negative.  Physical Exam: Well-developed well-nourished in no acute distress.  Skin is warm and dry.  HEENT is normal.  Neck is supple.  Chest is clear to auscultation with normal expansion.  Cardiovascular exam is regular rate and rhythm.  Abdominal exam nontender or distended. No masses palpated. Extremities show 1+ edema. neuro grossly intact  ECG Sinus rhythm at a rate of 85. Nonspecific ST changes.  A/P  1 Acute on chronic systolic congestive heart failure-patient is mildly volume overloaded on examination after missing her Lasix previously. We discussed low-sodium diet. Increase Lasix to 40 mg daily for 3 days and then resume 20 mg daily. Add spironolactone 25 mg daily. Check potassium and renal function in 1 week. We will consider changing lisinopril to entresto in the future vs adding nitrates to hydralazine. 2 ischemic cardiomyopathy-continue ACE inhibitor and beta blocker. We again discussed the increased risk of sudden death with her cardiomyopathy. I recommended evaluation for ICD but she declined and understands the risk of sudden death. 3 hyperlipidemia-continue statin. Check lipids and liver. 4 hypertension-blood pressure controlled. Continue present medications. 5 coronary artery disease-continue aspirin and statin.  Kirk Ruths, MD

## 2015-08-08 ENCOUNTER — Ambulatory Visit (INDEPENDENT_AMBULATORY_CARE_PROVIDER_SITE_OTHER): Payer: Medicaid Other | Admitting: Cardiology

## 2015-08-08 ENCOUNTER — Encounter: Payer: Self-pay | Admitting: Cardiology

## 2015-08-08 VITALS — BP 125/76 | HR 85 | Ht 62.0 in | Wt 173.0 lb

## 2015-08-08 DIAGNOSIS — E785 Hyperlipidemia, unspecified: Secondary | ICD-10-CM

## 2015-08-08 DIAGNOSIS — I10 Essential (primary) hypertension: Secondary | ICD-10-CM | POA: Diagnosis not present

## 2015-08-08 DIAGNOSIS — I2510 Atherosclerotic heart disease of native coronary artery without angina pectoris: Secondary | ICD-10-CM | POA: Diagnosis not present

## 2015-08-08 DIAGNOSIS — I5022 Chronic systolic (congestive) heart failure: Secondary | ICD-10-CM

## 2015-08-08 MED ORDER — SPIRONOLACTONE 25 MG PO TABS: 25.0000 mg | ORAL_TABLET | Freq: Every day | ORAL | Status: DC

## 2015-08-08 NOTE — Patient Instructions (Signed)
Medications  START Spironolactone 25mg  once daily.  Increase Furosemide to 40mg  for 3 days; then back to 20mg  daily.    Lab work  Return for [employer] work in 1 week.    Follow-up with Dr. Stanford Breed in 8 weeks.

## 2015-08-19 ENCOUNTER — Telehealth: Payer: Self-pay | Admitting: *Deleted

## 2015-08-19 DIAGNOSIS — N289 Disorder of kidney and ureter, unspecified: Secondary | ICD-10-CM

## 2015-08-19 LAB — LIPID PANEL
CHOL/HDL RATIO: 2.7 ratio (ref <=5.0)
Cholesterol: 169 mg/dL (ref 125–200)
HDL: 63 mg/dL (ref >=46)
LDL CALC: 85 mg/dL (ref <130)
Triglycerides: 107 mg/dL (ref <150)
VLDL: 21 mg/dL (ref <30)

## 2015-08-19 LAB — BASIC METABOLIC PANEL WITH GFR
BUN: 38 mg/dL — AB (ref 7–25)
CALCIUM: 8.8 mg/dL (ref 8.6–10.4)
CO2: 24 mmol/L (ref 20–31)
CREATININE: 1.53 mg/dL — AB (ref 0.50–0.99)
Chloride: 104 mmol/L (ref 98–110)
GFR, Est African American: 41 mL/min — ABNORMAL LOW (ref >=60)
GFR, Est Non African American: 35 mL/min — ABNORMAL LOW (ref >=60)
GLUCOSE: 104 mg/dL — AB (ref 65–99)
Potassium: 4.7 mmol/L (ref 3.5–5.3)
SODIUM: 137 mmol/L (ref 135–146)

## 2015-08-19 LAB — HEPATIC FUNCTION PANEL
ALBUMIN: 3.3 g/dL — AB (ref 3.6–5.1)
ALT: 14 U/L (ref 6–29)
AST: 24 U/L (ref 10–35)
Alkaline Phosphatase: 82 U/L (ref 33–130)
BILIRUBIN DIRECT: 0.1 mg/dL (ref <=0.2)
BILIRUBIN TOTAL: 0.2 mg/dL (ref 0.2–1.2)
Indirect Bilirubin: 0.1 mg/dL — ABNORMAL LOW (ref 0.2–1.2)
Total Protein: 6.7 g/dL (ref 6.1–8.1)

## 2015-08-19 NOTE — Telephone Encounter (Signed)
Spoke with pt, she voiced understanding to stop furosemide, she is not taking potassium. She will come to the lab next week. She will call with swelling.

## 2015-08-19 NOTE — Telephone Encounter (Signed)
-----   Message from Lelon Perla, MD sent at 08/19/2015  5:07 AM EDT ----- Dc lasix and continue spironolactone, call if increased edema, dc kcl, bmet 1 week Kirk Ruths

## 2015-08-23 ENCOUNTER — Encounter: Payer: Medicaid Other | Admitting: Internal Medicine

## 2015-08-29 ENCOUNTER — Encounter: Payer: Self-pay | Admitting: Internal Medicine

## 2015-09-01 ENCOUNTER — Encounter: Payer: Self-pay | Admitting: Internal Medicine

## 2015-09-01 ENCOUNTER — Ambulatory Visit (INDEPENDENT_AMBULATORY_CARE_PROVIDER_SITE_OTHER): Payer: Medicare Other | Admitting: Internal Medicine

## 2015-09-01 VITALS — BP 146/72 | HR 85 | Temp 98.7°F | Ht 62.0 in | Wt 172.3 lb

## 2015-09-01 DIAGNOSIS — I10 Essential (primary) hypertension: Secondary | ICD-10-CM

## 2015-09-01 DIAGNOSIS — E119 Type 2 diabetes mellitus without complications: Secondary | ICD-10-CM

## 2015-09-01 LAB — GLUCOSE, CAPILLARY: GLUCOSE-CAPILLARY: 117 mg/dL — AB (ref 65–99)

## 2015-09-01 LAB — POCT GLYCOSYLATED HEMOGLOBIN (HGB A1C): Hemoglobin A1C: 7.2

## 2015-09-01 MED ORDER — LISINOPRIL 40 MG PO TABS: ORAL_TABLET | ORAL | 5 refills | Status: DC

## 2015-09-01 NOTE — Assessment & Plan Note (Signed)
Patient with essential hypertension. Currently she is out of lisinopril. We'll refill this. -- Coreg 25 mg twice a day -- Hydralazine 10 mg 3 times a day -- Lisinopril 40 mg once daily -- Discontinue recently added spironolactone and Lasix per cardiologist in the setting of a rising creatinine to 1.53 -- Return to clinic in 3 months for assessment of blood pressure and medications

## 2015-09-01 NOTE — Assessment & Plan Note (Signed)
Patient's diabetes improving secondary to diet and exercise. Currently, she is not taking any diabetic medications. She did not tolerate metformin and this was discontinued. Her most recent hemoglobin A1c was 7.2 which is improved from 7.8. She does not wish to take any medications for her blood sugars at this time. I think this is a reasonable approach given that she was able to reduce her hemoglobin A1c without medication and states that she will continue to work on lifestyle modifications. -- Lifestyle modification -- Return to clinic in 3 months for further evaluation

## 2015-09-01 NOTE — Patient Instructions (Signed)
It was a pleasure seeing you today. Thank you for choosing [facility] for your healthcare needs.  -- Keep up the great work -- Start Lisinopril -- Stop lasix and spironolactone -- Return to clinic in 3 months for BP and DM management

## 2015-09-01 NOTE — Progress Notes (Signed)
   CC: Medication refill HPI: [name] is a 65 y.o. female with a h/o of essential hypertension, coronary artery disease, cardiomyopathy, diabetes mellitus, hyperlipidemia and dyslipidemia who presents for medication refill and normal maintenance. She denies headache, changes in vision or night sweats. She denies shortness of breath, chest pain, nausea, vomiting or abdominal pain. She has no additional acute complaints or concerns at today's visit.  Please see problem-based charting for status of medical issues pertinent to this visit.      Review of Systems: A complete ROS was negative except as per HPI.  Physical Exam: Vitals:   09/01/15 1523  BP: (!) 146/72  Pulse: 85  Temp: 98.7 F (37.1 C)  TempSrc: Oral  SpO2: 100%  Weight: 172 lb 4.8 oz (78.2 kg)  Height: 5\' 2"  (1.575 m)   BP (!) 146/72 (BP Location: Right Arm, Patient Position: Sitting, Cuff Size: Normal)   Pulse 85   Temp 98.7 F (37.1 C) (Oral)   Ht 5\' 2"  (1.575 m)   Wt 172 lb 4.8 oz (78.2 kg)   SpO2 100% Comment: room air  BMI 31.51 kg/m  General appearance: alert and cooperative Head: Normocephalic, without obvious abnormality, atraumatic Lungs: clear to auscultation bilaterally Heart: regular rate and rhythm, S1, S2 normal, no murmur, click, rub or gallop Abdomen: soft, non-tender; bowel sounds normal; no masses,  no organomegaly and No abdominal bruits auscultated Extremities: extremities normal, atraumatic, no cyanosis or edema  Assessment & Plan:  See encounters tab for problem based medical decision making. Patient seen with Dr. Eppie Gibson  Signed: Ophelia Shoulder, MD 09/01/2015, 5:46 PM  Pager: [phone]

## 2015-09-02 LAB — BMP8+ANION GAP
ANION GAP: 14 mmol/L (ref 10.0–18.0)
BUN / CREAT RATIO: 16 (ref 12–28)
BUN: 16 mg/dL (ref 8–27)
CO2: 26 mmol/L (ref 18–29)
CREATININE: 1.03 mg/dL — AB (ref 0.57–1.00)
Calcium: 9.1 mg/dL (ref 8.7–10.3)
Chloride: 105 mmol/L (ref 96–106)
GFR, EST AFRICAN AMERICAN: 66 mL/min/{1.73_m2} (ref >59)
GFR, EST NON AFRICAN AMERICAN: 57 mL/min/{1.73_m2} — AB (ref >59)
Glucose: 90 mg/dL (ref 65–99)
POTASSIUM: 4.8 mmol/L (ref 3.5–5.2)
SODIUM: 145 mmol/L — AB (ref 134–144)

## 2015-09-02 NOTE — Progress Notes (Signed)
I saw and evaluated the patient.  I personally confirmed the key portions of Dr. Tanna Furry history and exam and reviewed pertinent patient test results.  The assessment, diagnosis, and plan were formulated together and I agree with the documentation in the resident's note.

## 2015-10-13 NOTE — Progress Notes (Signed)
HPI: FU CAD; previously admitted with CHF; echo 1/16 showed EF 25-30, mild to moderate MR, mild LAE/RAE/RVE; moderately reduced RV function; small pericardial effusion. Carotid dopplers 1/16 showed 1-39 bilateral stenosis. Cath revealed 3 vessel CAD and EF 30-35. Had CABG with LIMA to LAD, seq svg to first and second diagonal, svg to OM, svg to PDA. Echocardiogram repeated June 2016 and showed ejection fraction 25-30%. Grade 1 diastolic dysfunction, mild mitral regurgitation. There is a density at the IVC and chest CT recommended. CT showed no mass. Patient referred to electrophysiology for consideration of ICD but patient declined. Since last seen, she has not taken any of her medications in the past 2 months by her report. She denies dyspnea, chest pain, palpitations or syncope. Mild pedal edema.  Current Outpatient Prescriptions  Medication Sig Dispense Refill  . aspirin EC 325 MG EC tablet Take 1 tablet (325 mg total) by mouth daily. 30 tablet 0  . atorvastatin (LIPITOR) 80 MG tablet Take 1 tablet (80 mg total) by mouth daily. 90 tablet 3  . blood glucose meter kit and supplies KIT Dispense based on patient and insurance preference. Use up to four times daily as directed. (FOR ICD-9 250.00, 250.01). 1 each 0  . carvedilol (COREG) 25 MG tablet TAKE 1 TABLET (25 MG TOTAL) BY MOUTH 2 (TWO) TIMES DAILY WITH A MEAL. 180 tablet 1  . glucose blood (ACCU-CHEK ACTIVE STRIPS) test strip Use as instructed 100 each 12  . hydrALAZINE (APRESOLINE) 10 MG tablet Take 1 tablet (10 mg total) by mouth 3 (three) times daily. 90 tablet 0  . LANCETS MICRO THIN 33G MISC 1 Units by Does not apply route daily. 100 each 12  . lisinopril (PRINIVIL,ZESTRIL) 40 MG tablet TAKE 1 TABLET (40 MG TOTAL) BY MOUTH DAILY. 30 tablet 5   No current facility-administered medications for this visit.      Past Medical History:  Diagnosis Date  . Bilateral pleural effusion 02/22/2014  . Diabetes mellitus 1998   Dx in 1998.  Microalbuminuria, never on insulin.  Marland Kitchen GERD 12/05/2005   Qualifier: Diagnosis of  By: Prudencio Burly MD, Phillip Heal    . GERD (gastroesophageal reflux disease)   . History of blurry vision 06/09   Hospitalized for this  . Hyperlipidemia   . Hypertension   . Personal history of colonic adenomas 10/07/2007  . Pneumonia 01/2014    Past Surgical History:  Procedure Laterality Date  . CORONARY ARTERY BYPASS GRAFT N/A 03/01/2014   Procedure: CORONARY ARTERY BYPASS GRAFTING (CABG) x five, using left internal mammary artery and right leg greater saphenous vein harvested endoscopically;  Surgeon: Grace Isaac, MD;  Location: [facility];  Service: Open Heart Surgery;  Laterality: N/A;  . INTRAOPERATIVE TRANSESOPHAGEAL ECHOCARDIOGRAM N/A 03/01/2014   Procedure: INTRAOPERATIVE TRANSESOPHAGEAL ECHOCARDIOGRAM;  Surgeon: Grace Isaac, MD;  Location: [facility];  Service: Open Heart Surgery;  Laterality: N/A;  . LEFT AND RIGHT HEART CATHETERIZATION WITH CORONARY ANGIOGRAM N/A 02/26/2014   Procedure: LEFT AND RIGHT HEART CATHETERIZATION WITH CORONARY ANGIOGRAM;  Surgeon: Sinclair Grooms, MD;  Location: [facility] CATH LAB;  Service: Cardiovascular;  Laterality: N/A;  . TOTAL ABDOMINAL HYSTERECTOMY W/ BILATERAL SALPINGOOPHORECTOMY  1996    Social History   Social History  . Marital status: Widowed    Spouse name: N/A  . Number of children: N/A  . Years of education: N/A   Occupational History  . Not on file.   Social History Main Topics  . Smoking  status: Never Smoker  . Smokeless tobacco: Never Used  . Alcohol use No  . Drug use: No  . Sexual activity: Not on file   Other Topics Concern  . Not on file   Social History Narrative   Married x 42 yrs.  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   Never smoked. Doesn't drink or use drugs.   Does Patient Exercise:  yes - sometimes      To get diabetes supplies for [name] RD, CDE, April 20,2011    Family  History  Problem Relation Age of Onset  . Diabetes Mother     Mother died of MI at age 33  . Heart failure Mother   . Kidney failure Mother   . Heart failure Sister     Died  . Heart failure Brother     Died  . Diabetes Brother     Died    ROS: no fevers or chills, productive cough, hemoptysis, dysphasia, odynophagia, melena, hematochezia, dysuria, hematuria, rash, seizure activity, orthopnea, PND, claudication. Remaining systems are negative.  Physical Exam: Well-developed well-nourished in no acute distress.  Skin is warm and dry.  HEENT is normal.  Neck is supple.  Chest is clear to auscultation with normal expansion.  Cardiovascular exam is regular rate and rhythm.  Abdominal exam nontender or distended. No masses palpated. Extremities show trace edema. neuro grossly intact  1 coronary artery disease-patient has discontinued all of her medications on her own. I explained the importance of compliance. We will resume aspirin 81 mg daily and Lipitor 80 mg daily.  2 ischemic cardiomyopathy-resume carvedilol at 25 mg twice a day and lisinopril 40 mg daily. Resume hydralazine 10 mg 3 times a day. Check potassium and renal function in 1 week. I considered entresto but did not initiate because of recent noncompliance. I will have her return in 8 weeks to see one of our assistants for medication titration. She declines ICD and understands the risk of sudden death.   3 chronic systolic congestive heart failure-mildly volume overloaded. However not having dyspnea. We need to control her blood pressure which would likely improve her CHF symptoms long-term. We discussed the importance of low sodium diet and fluid restriction.  4 hyperlipidemia-resume Lipitor 80 mg daily.  5 hypertension-blood pressure is elevated. Resume all previous medications.  6 noncompliance-patient educated on the importance of complying.  Kirk Ruths, MD

## 2015-10-14 ENCOUNTER — Encounter: Payer: Self-pay | Admitting: Cardiology

## 2015-10-17 ENCOUNTER — Ambulatory Visit (INDEPENDENT_AMBULATORY_CARE_PROVIDER_SITE_OTHER): Payer: Medicaid Other | Admitting: Cardiology

## 2015-10-17 ENCOUNTER — Encounter: Payer: Self-pay | Admitting: Cardiology

## 2015-10-17 VITALS — BP 166/98 | HR 89 | Ht 62.0 in | Wt 166.0 lb

## 2015-10-17 DIAGNOSIS — I255 Ischemic cardiomyopathy: Secondary | ICD-10-CM

## 2015-10-17 DIAGNOSIS — I10 Essential (primary) hypertension: Secondary | ICD-10-CM | POA: Diagnosis not present

## 2015-10-17 DIAGNOSIS — I2510 Atherosclerotic heart disease of native coronary artery without angina pectoris: Secondary | ICD-10-CM | POA: Diagnosis not present

## 2015-10-17 DIAGNOSIS — E785 Hyperlipidemia, unspecified: Secondary | ICD-10-CM

## 2015-10-17 MED ORDER — ASPIRIN EC 81 MG PO TBEC: 81.0000 mg | DELAYED_RELEASE_TABLET | Freq: Every day | ORAL | Status: DC

## 2015-10-17 NOTE — Patient Instructions (Addendum)
Medication Instructions:   RESTART LISINOPRIL 40 MG ONCE DAILY  RESTART ATORVASTATIN 80 MG ONCE DAILY  RESTART CARVEDILOL 25 MG ONE TABLET TWICE DAILY  START ASPIRIN 81 MG ONCE DAILY  Labwork:  Your physician recommends that you return for lab work in:ONE WEEK  Follow-Up:  Your physician recommends that you schedule a follow-up appointment in: Ignacio wants you to follow-up in: Bar[place] will receive a reminder letter in the mail two months in advance. If you don't receive a letter, please call our office to schedule the follow-up appointment.

## 2015-10-17 NOTE — Addendum Note (Signed)
Addended by: Cristopher Estimable on: 10/17/2015 03:29 PM   Modules accepted: Orders

## 2015-12-12 ENCOUNTER — Encounter: Payer: Medicare Other | Admitting: Physician Assistant

## 2015-12-12 NOTE — Progress Notes (Deleted)
Cardiology Office Note    Date:  12/12/2015   ID:  [name], DOB 11/19/1950, MRN [id number]  PCP:  Ophelia Shoulder, MD  Cardiologist:  Dr. Stanford Breed  Chief Complaint  Patient presents with  . Follow-up    seen for Dr. Stanford Breed    History of Present Illness:  [name] is a 65 y.o. female with PMH of chronic systolic heart failure, mild-to-moderate MR, ischemic cardio myopathy, CAD status post CABG with LIMA to LAD, sequential SVG to D1 and D2, SVG to OM, SVG to PDA. Echocardiogram obtained in January 2016 revealed EF 25-30%, moderate moderate MR, moderately reduced RV EF, small pericardial effusion. Carotid Doppler obtained in January 2016 showed mild carotid artery disease. Cardiac catheterization showed three-vessel CAD with EF 30-35%. She subsequently underwent CABG. Echocardiogram repeated in June 2016 showed EF 45-03%, grade 1 diastolic dysfunction, mild MR. There is a density at the IVC and subsequent chest CT showed no mass. She was referred to EP for consideration of ICD, however she declined. She was last seen on 10/17/2015, at that time, she has not taken her medication for over 2 months after self discontinuing all the medication. She was resumed on aspirin, Lipitor 80 mg daily, carvedilol 25 mg twice a day, and lisinopril 40 mg daily. She was also resumed on hydralazine 10 mg 3 times a day. There was some consideration of contrast oh, however did not initiate because of noncompliance. She presents today for medication titration.  No EKG if no chest pain   Past Medical History:  Diagnosis Date  . Bilateral pleural effusion 02/22/2014  . Diabetes mellitus 1998   Dx in 1998. Microalbuminuria, never on insulin.  Marland Kitchen GERD 12/05/2005   Qualifier: Diagnosis of  By: Prudencio Burly MD, Phillip Heal    . GERD (gastroesophageal reflux disease)   . History of blurry vision 06/09   Hospitalized for this  . Hyperlipidemia   . Hypertension   . Personal history of colonic adenomas 10/07/2007    . Pneumonia 01/2014    Past Surgical History:  Procedure Laterality Date  . CORONARY ARTERY BYPASS GRAFT N/A 03/01/2014   Procedure: CORONARY ARTERY BYPASS GRAFTING (CABG) x five, using left internal mammary artery and right leg greater saphenous vein harvested endoscopically;  Surgeon: Grace Isaac, MD;  Location: [place];  Service: Open Heart Surgery;  Laterality: N/A;  . INTRAOPERATIVE TRANSESOPHAGEAL ECHOCARDIOGRAM N/A 03/01/2014   Procedure: INTRAOPERATIVE TRANSESOPHAGEAL ECHOCARDIOGRAM;  Surgeon: Grace Isaac, MD;  Location: [facility];  Service: Open Heart Surgery;  Laterality: N/A;  . LEFT AND RIGHT HEART CATHETERIZATION WITH CORONARY ANGIOGRAM N/A 02/26/2014   Procedure: LEFT AND RIGHT HEART CATHETERIZATION WITH CORONARY ANGIOGRAM;  Surgeon: Sinclair Grooms, MD;  Location[facility] C[facility] LAB;  Service: Cardiovascular;  Laterality: N/A;  . TOTAL ABDOMINAL HYSTERECTOMY W/ BILATERAL SALPINGOOPHORECTOMY  1996    Current Medications: Outpatient Medications Prior to Visit  Medication Sig Dispense Refill  . aspirin 81 MG tablet Take 1 tablet (81 mg total) by mouth daily.    Marland Kitchen atorvastatin (LIPITOR) 80 MG tablet Take 1 tablet (80 mg total) by mouth daily. 90 tablet 3  . blood glucose meter kit and supplies KIT Dispense based on patient and insurance preference. Use up to four times daily as directed. (FOR ICD-9 250.00, 250.01). 1 each 0  . carvedilol (COREG) 25 MG tablet TAKE 1 TABLET (25 MG TOTAL) BY MOUTH 2 (TWO) TIMES DAILY WITH A MEAL. 180 tablet 1  . glucose blood (ACCU-CHEK  ACTIVE STRIPS) test strip Use as instructed 100 each 12  . LANCETS MICRO THIN 33G MISC 1 Units by Does not apply route daily. 100 each 12  . lisinopril (PRINIVIL,ZESTRIL) 40 MG tablet TAKE 1 TABLET (40 MG TOTAL) BY MOUTH DAILY. 30 tablet 5   No facility-administered medications prior to visit.      Allergies:   Patient has no known allergies.   Social History   Social History  . Marital status: Widowed    Spouse  name: N/A  . Number of children: N/A  . Years of education: N/A   Social History Main Topics  . Smoking status: Never Smoker  . Smokeless tobacco: Never Used  . Alcohol use No  . Drug use: No  . Sexual activity: Not on file   Other Topics Concern  . Not on file   Social History Narrative   Married x 42 yrs.  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   Never smoked. Doesn't drink or use drugs.   Does Patient Exercise:  yes - sometimes      To get diabetes supplies for [name] RD, CDE, April 20,2011     Family History:  The patient's ***family history includes Diabetes in her brother and mother; Heart failure in her brother, mother, and sister; Kidney failure in her mother.   ROS:   Please see the history of present illness.    ROS All other systems reviewed and are negative.   PHYSICAL EXAM:   VS:  There were no vitals taken for this visit.   GEN: Well nourished, well developed, in no acute distress  HEENT: normal  Neck: no JVD, carotid bruits, or masses Cardiac: ***RRR; no murmurs, rubs, or gallops,no edema  Respiratory:  clear to auscultation bilaterally, normal work of breathing GI: soft, nontender, nondistended, + BS MS: no deformity or atrophy  Skin: warm and dry, no rash Neuro:  Alert and Oriented x 3, Strength and sensation are intact Psych: euthymic mood, full affect  Wt Readings from Last 3 Encounters:  10/17/15 166 lb (75.3 kg)  09/01/15 172 lb 4.8 oz (78.2 kg)  08/08/15 173 lb (78.5 kg)      Studies/Labs Reviewed:   EKG:  EKG is*** ordered today.  The ekg ordered today demonstrates ***  Recent Labs: 08/18/2015: ALT 14 09/01/2015: BUN 16; Creatinine, Ser 1.03; Potassium 4.8; Sodium 145   Lipid Panel    Component Value Date/Time   CHOL 169 08/18/2015 1135   TRIG 107 08/18/2015 1135   HDL 63 08/18/2015 1135   CHOLHDL 2.7 08/18/2015 1135   VLDL 21 08/18/2015 1135   LDLCALC 85 08/18/2015  1135    Additional studies/ records that were reviewed today include:   Echo 02/22/2014 LV EF: 25% -  30%  Study Conclusions  - Left ventricle: The cavity size was normal. Wall thickness was increased in a pattern of mild LVH. Systolic function was severely reduced. The estimated ejection fraction was in the range of 25% to 30%. Diffuse hypokinesis. - Mitral valve: There was mild to moderate regurgitation. - Left atrium: The atrium was mildly dilated. - Right ventricle: The cavity size was mildly dilated. Systolic function was moderately reduced. - Right atrium: The atrium was mildly dilated. - Tricuspid valve: There was mild-moderate regurgitation. - Pericardium, extracardiac: A small pericardial effusion was identified posterior to the heart. There was a left pleural effusion.    Cath 02/26/2014 LEFT VENTRICULOGRAM:  Left ventricular  angiogram was done in the 30 RAO projection and revealed a dilated left ventricular cavity with global hypokinesis and an estimated ejection fraction of 30-35%.   IMPRESSIONS:  1. Severe three-vessel coronary artery disease with total occlusion of the proximal to mid circumflex, total occlusion of the mid RCA, and high grade multifocal obstruction in the proximal and mid LAD. The LAD supplies collaterals to the circumflex and right coronary territories. The second diagonal contains high-grade proximal disease. 2. Ischemic cardiomyopathy with ejection fraction of 30-35%.     CABG by Dr. Servando Snare 03/04/2014 POSTOPERATIVE DIAGNOSIS:  Severe 3-vessel coronary artery disease with severe LV dysfunction.  Ejection fraction 25-30%.  SURGICAL PROCEDURE:  Coronary artery bypass grafting x5 with left internal mammary to the left anterior descending coronary artery, sequential reverse saphenous vein graft to the first diagonal and second diagonal, reverse saphenous vein graft to the obtuse marginal, reverse saphenous vein graft to the  posterior descending with right thigh and calf endo vein harvesting.     Echo 07/29/2014 LV EF: 25% -   30%  - Left ventricle: The cavity size was normal. Systolic function was   severely reduced. The estimated ejection fraction was in the   range of 25% to 30%. Diffuse hypokinesis. There is akinesis of   the basal-midinferoseptal myocardium. There is akinesis of the   basal-midinferior myocardium. There is akinesis of the   basal-midanteroseptal myocardium. There was an increased relative   contribution of atrial contraction to ventricular filling.   Doppler parameters are consistent with abnormal left ventricular   relaxation (grade 1 diastolic dysfunction). - Aortic valve: Trileaflet; mildly thickened, mildly calcified   leaflets. - Mitral valve: There was mild regurgitation. - Systemic veins: There is a moderate tp large sized density in the   IVC at the junction of unknown significance. Recommend Chest CT   to rule out thrombus vs. limited repeat echo to assess this area   further. - Recommendations: Repeat limited echo or chest CT angio to   evaluate possible mass/thrombus in IVC at junction of RA.  ASSESSMENT:    No diagnosis found.   PLAN:  In order of problems listed above:  1. ***    Medication Adjustments/Labs and Tests Ordered: Current medicines are reviewed at length with the patient today.  Concerns regarding medicines are outlined above.  Medication changes, Labs and Tests ordered today are listed in the Patient Instructions below. There are no Patient Instructions on file for this visit.   Hilbert Corrigan, [place]  12/12/2015 1:10 PM    [facility] [address], [place], [place]  [address] Phone: [phone]; Fax: [phone]

## 2015-12-12 NOTE — Progress Notes (Signed)
This encounter was created in error - please disregard.

## 2015-12-12 NOTE — Patient Instructions (Signed)
Medication Instructions:  Your physician recommends that you continue on your current medications as directed. Please refer to the Current Medication list given to you today.  Labwork: None   Testing/Procedures: None   Follow-Up: Your physician recommends that you schedule a follow-up appointment in: FOLLOW UP Wednesday OR Friday WITH HAO MENG, PA  Any Other Special Instructions Will Be Listed Below (If Applicable).     If you need a refill on your cardiac medications before your next appointment, please call your pharmacy.

## 2015-12-13 ENCOUNTER — Other Ambulatory Visit: Payer: Self-pay

## 2015-12-13 MED ORDER — CARVEDILOL 25 MG PO TABS: 25.0000 mg | ORAL_TABLET | Freq: Two times a day (BID) | ORAL | 1 refills | Status: DC

## 2015-12-13 NOTE — Telephone Encounter (Signed)
REFILL 

## 2015-12-16 ENCOUNTER — Encounter: Payer: Self-pay | Admitting: Physician Assistant

## 2015-12-16 ENCOUNTER — Ambulatory Visit (INDEPENDENT_AMBULATORY_CARE_PROVIDER_SITE_OTHER): Payer: Medicare Other | Admitting: Physician Assistant

## 2015-12-16 VITALS — BP 156/88 | HR 85 | Ht 62.0 in | Wt 168.2 lb

## 2015-12-16 DIAGNOSIS — Z79899 Other long term (current) drug therapy: Secondary | ICD-10-CM

## 2015-12-16 DIAGNOSIS — I255 Ischemic cardiomyopathy: Secondary | ICD-10-CM

## 2015-12-16 DIAGNOSIS — I5023 Acute on chronic systolic (congestive) heart failure: Secondary | ICD-10-CM

## 2015-12-16 DIAGNOSIS — I25810 Atherosclerosis of coronary artery bypass graft(s) without angina pectoris: Secondary | ICD-10-CM

## 2015-12-16 DIAGNOSIS — I340 Nonrheumatic mitral (valve) insufficiency: Secondary | ICD-10-CM

## 2015-12-16 DIAGNOSIS — I10 Essential (primary) hypertension: Secondary | ICD-10-CM

## 2015-12-16 MED ORDER — HYDROCHLOROTHIAZIDE 25 MG PO TABS: 25.0000 mg | ORAL_TABLET | Freq: Every day | ORAL | 3 refills | Status: DC

## 2015-12-16 NOTE — Patient Instructions (Addendum)
Your physician has recommended you make the following change in your medication:  HYDROCHLOROTHIAZIDE  25 MG  [place]    Your physician recommends that you return for lab work in: [place] recommends that you schedule a follow-up appointment in: 3 -[place]

## 2015-12-16 NOTE — Progress Notes (Signed)
Cardiology Office Note    Date:  12/16/2015   ID:  [name], DOB 12-Aug-1950, MRN [id number]  PCP:  Julia Shoulder, MD  Cardiologist:  Dr. Stanford Silva   Chief Complaint  Patient presents with  . Follow-up    seen for Dr. Stanford Silva, BP medication titration.     History of Present Illness:  [name] is a 65 y.o. female with PMH of chronic systolic heart failure, mild-to-moderate MR, ischemic cardiomyopathy, CAD s/p CABG with LIMA to LAD, sequential SVG to D1 and D2, SVG to OM, SVG to PDA. Echocardiogram obtained in January 2016 revealed EF 25-30%, moderate moderate MR, moderately reduced RV EF, small pericardial effusion. Carotid Doppler obtained in January 2016 showed mild carotid artery disease. Cardiac catheterization showed three-vessel CAD with EF 30-35%. She subsequently underwent CABG. Echocardiogram repeated in June 2016 showed EF 41-28%, grade 1 diastolic dysfunction, mild MR. There is a density at the IVC and subsequent chest CT showed no mass. She was referred to EP for consideration of ICD, however she declined. She was last seen on 10/17/2015, at that time, she has not taken her medication for over 2 months after self discontinuing all the medication. She was resumed on aspirin, Lipitor 80 mg daily, carvedilol 25 mg twice a day, and lisinopril 40 mg daily. She was also resumed on hydralazine 10 mg 3 times a day. There was some consideration of contrast oh, however did not initiate because of noncompliance. She presents today for medication titration.  Patient presented for cardiology office visit on 12/14/2015, however on that day she did not take any of her blood pressure medication, therefore on arrival her blood pressure was in the 160s. I was unable to titrate her medication, therefore we have canceled that office visit and then rescheduled for today. She did take her blood pressure medication at 8 AM this morning, on arrival, her blood pressure still elevated at 156/88.  I have personally rechecked her blood pressure manually, and her systolic blood pressure remaining in the 150s. She also has at least 1-2+ pitting edema in bilateral lower extremities. Her lung is clear and she denies any shortness of breath, orthopnea or paroxysmal nocturnal dyspnea. I will start her on hydrochlorothiazide 25 mg daily for blood pressure control and for mild diuresis. I will see her back in 3-4 weeks to further check her blood pressure in the titrate her medication.    Past Medical History:  Diagnosis Date  . Bilateral pleural effusion 02/22/2014  . Diabetes mellitus 1998   Dx in 1998. Microalbuminuria, never on insulin.  Marland Kitchen GERD 12/05/2005   Qualifier: Diagnosis of  By: Prudencio Burly MD, Phillip Heal    . GERD (gastroesophageal reflux disease)   . History of blurry vision 06/09   Hospitalized for this  . Hyperlipidemia   . Hypertension   . Personal history of colonic adenomas 10/07/2007  . Pneumonia 01/2014    Past Surgical History:  Procedure Laterality Date  . CORONARY ARTERY BYPASS GRAFT N/A 03/01/2014   Procedure: CORONARY ARTERY BYPASS GRAFTING (CABG) x five, using left internal mammary artery and right leg greater saphenous vein harvested endoscopically;  Surgeon: Grace Isaac, MD;  Location: [facility];  Service: Open Heart Surgery;  Laterality: N/A;  . INTRAOPERATIVE TRANSESOPHAGEAL ECHOCARDIOGRAM N/A 03/01/2014   Procedure: INTRAOPERATIVE TRANSESOPHAGEAL ECHOCARDIOGRAM;  Surgeon: Grace Isaac, MD;  Location: [facility];  Service: Open Heart Surgery;  Laterality: N/A;  . LEFT AND RIGHT HEART CATHETERIZATION WITH CORONARY ANGIOGRAM N/A 02/26/2014  Procedure: LEFT AND RIGHT HEART CATHETERIZATION WITH CORONARY ANGIOGRAM;  Surgeon: Sinclair Grooms, MD;  Location: [facility] CATH LAB;  Service: Cardiovascular;  Laterality: N/A;  . TOTAL ABDOMINAL HYSTERECTOMY W/ BILATERAL SALPINGOOPHORECTOMY  1996    Current Medications: Outpatient Medications Prior to Visit  Medication Sig Dispense Refill    . aspirin 81 MG tablet Take 1 tablet (81 mg total) by mouth daily.    Marland Kitchen atorvastatin (LIPITOR) 80 MG tablet Take 1 tablet (80 mg total) by mouth daily. 90 tablet 3  . blood glucose meter kit and supplies KIT Dispense based on patient and insurance preference. Use up to four times daily as directed. (FOR ICD-9 250.00, 250.01). 1 each 0  . carvedilol (COREG) 25 MG tablet Take 1 tablet (25 mg total) by mouth 2 (two) times daily with a meal. 180 tablet 1  . glucose blood (ACCU-CHEK ACTIVE STRIPS) test strip Use as instructed 100 each 12  . LANCETS MICRO THIN 33G MISC 1 Units by Does not apply route daily. 100 each 12  . lisinopril (PRINIVIL,ZESTRIL) 40 MG tablet TAKE 1 TABLET (40 MG TOTAL) BY MOUTH DAILY. 30 tablet 5   No facility-administered medications prior to visit.      Allergies:   Patient has no known allergies.   Social History   Social History  . Marital status: Widowed    Spouse name: N/A  . Number of children: N/A  . Years of education: N/A   Social History Main Topics  . Smoking status: Never Smoker  . Smokeless tobacco: Never Used  . Alcohol use No  . Drug use: No  . Sexual activity: Not Asked   Other Topics Concern  . None   Social History Narrative   Married x 42 yrs.  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   Never smoked. Doesn't drink or use drugs.   Does Patient Exercise:  yes - sometimes      To get diabetes supplies for [name] RD, CDE, April 20,2011     Family History:  The patient's family history includes Diabetes in her brother and mother; Heart failure in her brother, mother, and sister; Kidney failure in her mother.   ROS:   Please see the history of present illness.    ROS All other systems reviewed and are negative.   PHYSICAL EXAM:   VS:  BP (!) 156/88   Pulse 85   Ht 5' 2"  (1.575 m)   Wt 168 lb 3.2 oz (76.3 kg)   SpO2 99%   BMI 30.76 kg/m    GEN: Well nourished, well  developed, in no acute distress  HEENT: normal  Neck: no JVD, carotid bruits, or masses Cardiac: RRR; no murmurs, rubs, or gallops. 1-2+ pitting edema  Respiratory:  clear to auscultation bilaterally, normal work of breathing GI: soft, nontender, nondistended, + BS MS: no deformity or atrophy  Skin: warm and dry, no rash Neuro:  Alert and Oriented x 3, Strength and sensation are intact Psych: euthymic mood, full affect  Wt Readings from Last 3 Encounters:  12/16/15 168 lb 3.2 oz (76.3 kg)  12/12/15 167 lb 4 oz (75.9 kg)  10/17/15 166 lb (75.3 kg)      Studies/Labs Reviewed:   EKG:  EKG is not ordered today.   Recent Labs: 08/18/2015: ALT 14 09/01/2015: BUN 16; Creatinine, Ser 1.03; Potassium 4.8; Sodium 145   Lipid Panel    Component Value Date/Time  CHOL 169 08/18/2015 1135   TRIG 107 08/18/2015 1135   HDL 63 08/18/2015 1135   CHOLHDL 2.7 08/18/2015 1135   VLDL 21 08/18/2015 1135   LDLCALC 85 08/18/2015 1135    Additional studies/ records that were reviewed today include:   Echo 02/22/2014 LV EF: 25% -   30%  Study Conclusions  - Left ventricle: The cavity size was normal. Wall thickness was   increased in a pattern of mild LVH. Systolic function was   severely reduced. The estimated ejection fraction was in the   range of 25% to 30%. Diffuse hypokinesis. - Mitral valve: There was mild to moderate regurgitation. - Left atrium: The atrium was mildly dilated. - Right ventricle: The cavity size was mildly dilated. Systolic   function was moderately reduced. - Right atrium: The atrium was mildly dilated. - Tricuspid valve: There was mild-moderate regurgitation. - Pericardium, extracardiac: A small pericardial effusion was   identified posterior to the heart. There was a left pleural   effusion.    Cath 02/26/2014 LEFT VENTRICULOGRAM:  Left ventricular angiogram was done in the 30 RAO projection and revealed a dilated left ventricular cavity with global  hypokinesis and an estimated ejection fraction of 30-35%.     IMPRESSIONS:  1. Severe three-vessel coronary artery disease with total occlusion of the proximal to mid circumflex, total occlusion of the mid RCA, and high grade multifocal obstruction in the proximal and mid LAD. The LAD supplies collaterals to the circumflex and right coronary territories. The second diagonal contains high-grade proximal disease. 2. Ischemic cardiomyopathy with ejection fraction of 30-35%.      CABG by Dr. Servando Snare 03/04/2014 POSTOPERATIVE DIAGNOSIS:  Severe 3-vessel coronary artery disease with severe LV dysfunction.  Ejection fraction 25-30%.   SURGICAL PROCEDURE:  Coronary artery bypass grafting x5 with left internal mammary to the left anterior descending coronary artery, sequential reverse saphenous vein graft to the first diagonal and second diagonal, reverse saphenous vein graft to the obtuse marginal, reverse saphenous vein graft to the posterior descending with right thigh and calf endo vein harvesting.    Echo 07/29/2014 LV EF: 25% -   30%   - Left ventricle: The cavity size was normal. Systolic function was   severely reduced. The estimated ejection fraction was in the   range of 25% to 30%. Diffuse hypokinesis. There is akinesis of   the basal-midinferoseptal myocardium. There is akinesis of the   basal-midinferior myocardium. There is akinesis of the   basal-midanteroseptal myocardium. There was an increased relative   contribution of atrial contraction to ventricular filling.   Doppler parameters are consistent with abnormal left ventricular   relaxation (grade 1 diastolic dysfunction). - Aortic valve: Trileaflet; mildly thickened, mildly calcified   leaflets. - Mitral valve: There was mild regurgitation. - Systemic veins: There is a moderate tp large sized density in the   IVC at the junction of unknown significance. Recommend Chest CT   to rule out thrombus vs. limited repeat echo to  assess this area   further. - Recommendations: Repeat limited echo or chest CT angio to   evaluate possible mass/thrombus in IVC at junction of RA.    ASSESSMENT:    1. Acute on chronic systolic heart failure ([place])   2. Encounter for long-term (current) use of high-risk medication   3. Coronary artery disease involving coronary bypass graft of native heart without angina pectoris   4. Mitral valve insufficiency, unspecified etiology   5. Cardiomyopathy, ischemic   6.  Essential hypertension      PLAN:  In order of problems listed above:  1. Acute on chronic systolic heart failure/ICM  - Previously declined ICD placement. Persistently low EF based on previous echocardiogram. She has at least one to 2+ pitting edema on physical exam today, she is in mild heart failure, this episode and ongoing for the past week. She also have uncontrolled high blood pressure, I will add 25 mg daily of HCTZ, she will obtain one week base metabolic panel and follow-up with me in 3-4 weeks for further blood pressure medication titration.  2. HTN, uncontrolled: Blood pressure is still uncontrolled today, given the presence of lower extremity edema, instead of her previous hydralazine, I will add 25 mg daily of HCTZ  3. CAD s/p CABG  with LIMA to LAD, sequential SVG to D1 and D2, SVG to OM, SVG to PDA: No angina  4. Mild to moderate MR: Seen on previous echocardiogram, however repeat echo in June 2016 showed mild MR instead. Will continue observation for now. No significant cardiac murmur on today's exam.    Medication Adjustments/Labs and Tests Ordered: Current medicines are reviewed at length with the patient today.  Concerns regarding medicines are outlined above.  Medication changes, Labs and Tests ordered today are listed in the Patient Instructions below. Patient Instructions  Your physician has recommended you make the following change in your medication:  HYDROCHLOROTHIAZIDE  25 MG  EVERY DAY     Your physician recommends that you return for lab work in: [place] recommends that you schedule a follow-up appointment in: 3 -[place]     Weston Brass Almyra Deforest, [place]  12/16/2015 3:58 PM    [address], [place], [place]  [address] Phone: [phone]; Fax: [phone]

## 2016-01-06 ENCOUNTER — Ambulatory Visit: Payer: Medicare Other | Admitting: Physician Assistant

## 2016-01-11 ENCOUNTER — Ambulatory Visit (INDEPENDENT_AMBULATORY_CARE_PROVIDER_SITE_OTHER): Payer: Medicare Other | Admitting: Physician Assistant

## 2016-01-11 ENCOUNTER — Encounter: Payer: Self-pay | Admitting: Physician Assistant

## 2016-01-11 VITALS — BP 169/90 | HR 88 | Resp 99 | Ht 62.0 in | Wt 168.4 lb

## 2016-01-11 DIAGNOSIS — I340 Nonrheumatic mitral (valve) insufficiency: Secondary | ICD-10-CM

## 2016-01-11 DIAGNOSIS — I5022 Chronic systolic (congestive) heart failure: Secondary | ICD-10-CM | POA: Diagnosis not present

## 2016-01-11 DIAGNOSIS — I10 Essential (primary) hypertension: Secondary | ICD-10-CM | POA: Diagnosis not present

## 2016-01-11 DIAGNOSIS — Z79899 Other long term (current) drug therapy: Secondary | ICD-10-CM | POA: Diagnosis not present

## 2016-01-11 DIAGNOSIS — I25810 Atherosclerosis of coronary artery bypass graft(s) without angina pectoris: Secondary | ICD-10-CM

## 2016-01-11 DIAGNOSIS — I255 Ischemic cardiomyopathy: Secondary | ICD-10-CM

## 2016-01-11 MED ORDER — ISOSORBIDE MONONITRATE ER 30 MG PO TB24: 30.0000 mg | ORAL_TABLET | Freq: Every day | ORAL | 3 refills | Status: DC

## 2016-01-11 MED ORDER — HYDRALAZINE HCL 10 MG PO TABS: 10.0000 mg | ORAL_TABLET | Freq: Three times a day (TID) | ORAL | 3 refills | Status: DC

## 2016-01-11 NOTE — Patient Instructions (Signed)
Medication Instructions:  START Imdur 30 mg Take 1 tablet by mouth daily  START Hydralazine 10mg  Take 1 tablet by mouth three times a day  Labwork: Your physician recommends that you return for lab work in: TODAY- [facility]  Testing/Procedures: NONE   Follow-Up: Your physician recommends that you schedule a follow-up appointment in: 2 [place], [place]  Your physician recommends that you schedule a follow-up appointment in:  [place]  Any Other Special Instructions Will Be Listed Below (If Applicable).     If you need a refill on your cardiac medications before your next appointment, please call your pharmacy.

## 2016-01-11 NOTE — Progress Notes (Signed)
Cardiology Office Note    Date:  01/11/2016   ID:  [name], DOB [birth year], MRN [id number]  PCP:  Ophelia Shoulder, MD  Cardiologist:  Dr. Stanford Breed   Chief Complaint  Patient presents with  . Follow-up    medication adjustment, seen for Dr. Stanford Breed    History of Present Illness:  [name] is a 64 y.o. female with PMH of chronic systolic heart failure, mild-to-moderate MR, ischemic cardiomyopathy, CAD s/p CABG with LIMA to LAD, sequential SVG to D1 and D2, SVG to OM, SVG to PDA. Echocardiogram obtained in January 2016 revealed EF 25-30%, moderate moderate MR, moderately reduced RV EF, small pericardial effusion. Carotid Doppler obtained in January 2016 showed mild carotid artery disease. Cardiac catheterization showed three-vessel CAD with EF 30-35%. She subsequently underwent CABG. Echocardiogram repeated in June 2016 showed EF 22-02%, grade 1 diastolic dysfunction, mild MR. There is a density at the IVC and subsequent chest CT showed no mass. She was referred to EP for consideration of ICD, however she declined. She was last seen on 10/17/2015, at that time, she has not taken her medication for over 2 months after self discontinuing all the medication. She was resumed on aspirin, Lipitor 80 mg daily, carvedilol 25 mg twice a day, and lisinopril 40 mg daily. She was also resumed on hydralazine 10 mg 3 times a day. There was some consideration of Entresto, however did not initiate because of noncompliance. She presents today for medication titration.  I last saw the patient on 12/16/2015 for blood pressure management. Her blood pressure at the time was still elevated at 156/88. I personally manually checked her blood pressure which remained 150s. She also had at least 1-2+ pitting edema in bilateral lower extremity. I placed her on hydrochlorothiazide 25 mg daily. I followed up with her today regarding her blood pressure, despite the fact that she took her blood pressure medication  this morning, her blood pressure continued to be elevated in the 160s range. She still has 1-2+ pitting edema on physical exam, however according to the patient, the degree of edema is not as bad as before and hydrochlorothiazide seems to be helping. Otherwise she denies any symptom of shortness of breath, chest discomfort, orthopnea or paroxysmal nocturnal dyspnea. I rechecked her blood pressure myself after a few minutes, her blood pressure did come back down to 140/80. At first, I wanted to add BiDil given her history of cardiomyopathy, however she likely would not be able to afford it. I eventually added Imdur 30 mg daily and hydralazine 10 mg 3 times a day. I will see her back in about 2 months and arrange follow-up with Dr. Stanford Breed in 4 months.   Past Medical History:  Diagnosis Date  . Bilateral pleural effusion 02/22/2014  . Diabetes mellitus 1998   Dx in 1998. Microalbuminuria, never on insulin.  Marland Kitchen GERD 12/05/2005   Qualifier: Diagnosis of  By: Prudencio Burly MD, Phillip Heal    . GERD (gastroesophageal reflux disease)   . History of blurry vision 06/09   Hospitalized for this  . Hyperlipidemia   . Hypertension   . Personal history of colonic adenomas 10/07/2007  . Pneumonia 01/2014    Past Surgical History:  Procedure Laterality Date  . CORONARY ARTERY BYPASS GRAFT N/A 03/01/2014   Procedure: CORONARY ARTERY BYPASS GRAFTING (CABG) x five, using left internal mammary artery and right leg greater saphenous vein harvested endoscopically;  Surgeon: Grace Isaac, MD;  Location: [facility];  Service: Open Heart  Surgery;  Laterality: N/A;  . INTRAOPERATIVE TRANSESOPHAGEAL ECHOCARDIOGRAM N/A 03/01/2014   Procedure: INTRAOPERATIVE TRANSESOPHAGEAL ECHOCARDIOGRAM;  Surgeon: Grace Isaac, MD;  Location: [facility];  Service: Open Heart Surgery;  Laterality: N/A;  . LEFT AND RIGHT HEART CATHETERIZATION WITH CORONARY ANGIOGRAM N/A 02/26/2014   Procedure: LEFT AND RIGHT HEART CATHETERIZATION WITH CORONARY ANGIOGRAM;   Surgeon: Sinclair Grooms, MD;  Location: [facility] C[facility] LAB;  Service: Cardiovascular;  Laterality: N/A;  . TOTAL ABDOMINAL HYSTERECTOMY W/ BILATERAL SALPINGOOPHORECTOMY  1996    Current Medications: Outpatient Medications Prior to Visit  Medication Sig Dispense Refill  . aspirin 81 MG tablet Take 1 tablet (81 mg total) by mouth daily.    Marland Kitchen atorvastatin (LIPITOR) 80 MG tablet Take 1 tablet (80 mg total) by mouth daily. 90 tablet 3  . blood glucose meter kit and supplies KIT Dispense based on patient and insurance preference. Use up to four times daily as directed. (FOR ICD-9 250.00, 250.01). 1 each 0  . carvedilol (COREG) 25 MG tablet Take 1 tablet (25 mg total) by mouth 2 (two) times daily with a meal. 180 tablet 1  . glucose blood (ACCU-CHEK ACTIVE STRIPS) test strip Use as instructed 100 each 12  . hydrochlorothiazide (HYDRODIURIL) 25 MG tablet Take 1 tablet (25 mg total) by mouth daily. 90 tablet 3  . LANCETS MICRO THIN 33G MISC 1 Units by Does not apply route daily. 100 each 12  . lisinopril (PRINIVIL,ZESTRIL) 40 MG tablet TAKE 1 TABLET (40 MG TOTAL) BY MOUTH DAILY. 30 tablet 5   No facility-administered medications prior to visit.      Allergies:   Patient has no known allergies.   Social History   Social History  . Marital status: Widowed    Spouse name: N/A  . Number of children: N/A  . Years of education: N/A   Social History Main Topics  . Smoking status: Never Smoker  . Smokeless tobacco: Never Used  . Alcohol use No  . Drug use: No  . Sexual activity: Not Asked   Other Topics Concern  . None   Social History Narrative   Married x 42 yrs.  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   Never smoked. Doesn't drink or use drugs.   Does Patient Exercise:  yes - sometimes      To get diabetes supplies for [name] RD, CDE, April 20,2011     Family History:  The patient's family history includes Diabetes in her  brother and mother; Heart failure in her brother, mother, and sister; Kidney failure in her mother.   ROS:   Please see the history of present illness.    ROS All other systems reviewed and are negative.   PHYSICAL EXAM:   VS:  BP (!) 169/90   Pulse 88   Resp (!) 99   Ht 5' 2"  (1.575 m)   Wt 168 lb 6 oz (76.4 kg)   BMI 30.80 kg/m    GEN: Well nourished, well developed, in no acute distress  HEENT: normal  Neck: no JVD, carotid bruits, or masses Cardiac: RRR; no murmurs, rubs, or gallops  1-2+ pitting edema Respiratory:  clear to auscultation bilaterally, normal work of breathing GI: soft, nontender, nondistended, + BS MS: no deformity or atrophy  Skin: warm and dry, no rash Neuro:  Alert and Oriented x 3, Strength and sensation are intact Psych: euthymic mood, full affect  Wt Readings from Last 3  Encounters:  01/11/16 168 lb 6 oz (76.4 kg)  12/16/15 168 lb 3.2 oz (76.3 kg)  12/12/15 167 lb 4 oz (75.9 kg)      Studies/Labs Reviewed:   EKG:  EKG is not ordered today.   Recent Labs: 08/18/2015: ALT 14 09/01/2015: BUN 16; Creatinine, Ser 1.03; Potassium 4.8; Sodium 145   Lipid Panel    Component Value Date/Time   CHOL 169 08/18/2015 1135   TRIG 107 08/18/2015 1135   HDL 63 08/18/2015 1135   CHOLHDL 2.7 08/18/2015 1135   VLDL 21 08/18/2015 1135   LDLCALC 85 08/18/2015 1135    Additional studies/ records that were reviewed today include:   Echo 02/22/2014 LV EF: 25% -  30%  Study Conclusions  - Left ventricle: The cavity size was normal. Wall thickness was increased in a pattern of mild LVH. Systolic function was severely reduced. The estimated ejection fraction was in the range of 25% to 30%. Diffuse hypokinesis. - Mitral valve: There was mild to moderate regurgitation. - Left atrium: The atrium was mildly dilated. - Right ventricle: The cavity size was mildly dilated. Systolic function was moderately reduced. - Right atrium: The atrium was mildly  dilated. - Tricuspid valve: There was mild-moderate regurgitation. - Pericardium, extracardiac: A small pericardial effusion was identified posterior to the heart. There was a left pleural effusion.    Cath 02/26/2014 LEFT VENTRICULOGRAM:Left ventricular angiogram was done in the 30 RAO projection and revealed a dilated left ventricular cavity with global hypokinesis and an estimated ejection fraction of 30-35%.   IMPRESSIONS:1. Severe three-vessel coronary artery disease with total occlusion of the proximal to mid circumflex, total occlusion of the mid RCA, and high grade multifocal obstruction in the proximal and mid LAD. The LAD supplies collaterals to the circumflex and right coronary territories. The second diagonal contains high-grade proximal disease. 2. Ischemic cardiomyopathy with ejection fraction of 30-35%.     CABG by Dr. Servando Snare 03/04/2014 POSTOPERATIVE DIAGNOSIS: Severe 3-vessel coronary artery disease with severe LV dysfunction. Ejection fraction 25-30%.  SURGICAL PROCEDURE: Coronary artery bypass grafting x5 with left internal mammary to the left anterior descending coronary artery, sequential reverse saphenous vein graft to the first diagonal and second diagonal, reverse saphenous vein graft to the obtuse marginal, reverse saphenous vein graft to the posterior descending with right thigh and calf endo vein harvesting.    Echo 07/29/2014 LV EF: 25% - 30%  - Left ventricle: The cavity size was normal. Systolic function was severely reduced. The estimated ejection fraction was in the range of 25% to 30%. Diffuse hypokinesis. There is akinesis of the basal-midinferoseptal myocardium. There is akinesis of the basal-midinferior myocardium. There is akinesis of the basal-midanteroseptal myocardium. There was an increased relative contribution of atrial contraction to ventricular filling. Doppler parameters are consistent with  abnormal left ventricular relaxation (grade 1 diastolic dysfunction). - Aortic valve: Trileaflet; mildly thickened, mildly calcified leaflets. - Mitral valve: There was mild regurgitation. - Systemic veins: There is a moderate tp large sized density in the IVC at the junction of unknown significance. Recommend Chest CT to rule out thrombus vs. limited repeat echo to assess this area further. - Recommendations: Repeat limited echo or chest CT angio to evaluate possible mass/thrombus in IVC at junction of RA.    ASSESSMENT:    1. Uncontrolled hypertension   2. Medication management   3. Chronic systolic heart failure (HCC)   4. Cardiomyopathy, ischemic   5. Coronary artery disease involving coronary bypass graft of native heart  without angina pectoris   6. Mitral valve insufficiency, unspecified etiology      PLAN:  In order of problems listed above:  1. Uncontrolled hypertension: Blood pressure remain high, initially blood pressure was in the 160s, on repeat systolic blood pressure 448. Given her cardiomyopathy, I have added Imdur and hydralazine to her medication.  2. Chronic systolic heart failure: I added hydrochlorothiazide to the previous office visit, it just seems to help with her lower extremity swelling. However she still has 1-2+ pitting edema. She has been elevating her legs recently. I will do another test reassessment of the next visit, if it does get worse, I will start her on Lasix.  3. CAD s/p CABG with LIMA to LAD, sequential SVG to D1 and D2, SVG to OM, SVG to PDA: No obvious angina recently.  4. Mild to moderate MR: Seen on previous echocardiogram, however repeat echo in June 2016 showed mild MR instead. Continue observation for now. No significant heart murmur on physical exam.    Medication Adjustments/Labs and Tests Ordered: Current medicines are reviewed at length with the patient today.  Concerns regarding medicines are outlined above.   Medication changes, Labs and Tests ordered today are listed in the Patient Instructions below. Patient Instructions  Medication Instructions:  START Imdur 30 mg Take 1 tablet by mouth daily  START Hydralazine 89m Take 1 tablet by mouth three times a day  Labwork: Your physician recommends that you return for lab work in: TODAY- BMET  Testing/Procedures: NONE   Follow-Up: Your physician recommends that you schedule a follow-up appointment in: [place]  Your physician recommends that you schedule a follow-up appointment in:  [place] Any Other Special Instructions Will Be Listed Below (If Applicable).     If you need a refill on your cardiac medications before your next appointment, please call your pharmacy.     SHilbert Corrigan [place] 01/11/2016 [facility] [address] [place]  [id number]: [phone] Fax: [phone]

## 2016-03-16 ENCOUNTER — Ambulatory Visit: Payer: Medicare Other | Admitting: Physician Assistant

## 2016-03-28 ENCOUNTER — Telehealth: Payer: Self-pay | Admitting: Physician Assistant

## 2016-03-28 ENCOUNTER — Encounter: Payer: Medicare Other | Admitting: Physician Assistant

## 2016-03-28 ENCOUNTER — Encounter: Payer: Self-pay | Admitting: Physician Assistant

## 2016-03-28 NOTE — Progress Notes (Deleted)
Cardiology Office Note    Date:  03/28/2016   ID:  [name], DOB Jul 04, 1950, MRN [id number]  PCP:  Ophelia Shoulder, MD  Cardiologist:  Dr. Stanford Breed   No chief complaint on file.   History of Present Illness:  [name] is a 66 y.o. female with PMH of chronic systolic heart failure, mild-to-moderate MR, ischemic cardiomyopathy, CAD s/p CABG with LIMA to LAD, sequential SVG to D1 and D2, SVG to OM, SVG to PDA. Echocardiogram obtained in January 2016 revealed EF 25-30%, moderate moderate MR, moderately reduced RV EF, small pericardial effusion. Carotid Doppler obtained in January 2016 showed mild carotid artery disease. Cardiac catheterization showed three-vessel CAD with EF 30-35%. She subsequently underwent CABG. Echocardiogram repeated in June 2016 showed EF 32-44%, grade 1 diastolic dysfunction, mild MR. There is a density at the IVC and subsequent chest CT showed no mass. She was referred to EP for consideration of ICD, however she declined. She was last seen on 10/17/2015, at that time, she has not taken her medication for over 2 months after self discontinuing all the medication. She was resumed on aspirin, Lipitor 80 mg daily, carvedilol 25 mg twice a day, and lisinopril 40 mg daily. She was also resumed on hydralazine 10 mg 3 times a day. There was some consideration of Entresto, however did not initiate because of noncompliance. She presents today for medication titration.  I saw her both in November and December or low pressure management. I placed her on hydrochlorothiazide 25 mg daily for blood pressure control and also because she was having 1-2+ pitting edema in the bilateral lower extremity. Given her LV dysfunction, I also added Imdur and hydralazine.  No EKG  Past Medical History:  Diagnosis Date  . Bilateral pleural effusion 02/22/2014  . Diabetes mellitus 1998   Dx in 1998. Microalbuminuria, never on insulin.  Marland Kitchen GERD 12/05/2005   Qualifier: Diagnosis of  By:  Prudencio Burly MD, Phillip Heal    . GERD (gastroesophageal reflux disease)   . History of blurry vision 06/09   Hospitalized for this  . Hyperlipidemia   . Hypertension   . Personal history of colonic adenomas 10/07/2007  . Pneumonia 01/2014    Past Surgical History:  Procedure Laterality Date  . CORONARY ARTERY BYPASS GRAFT N/A 03/01/2014   Procedure: CORONARY ARTERY BYPASS GRAFTING (CABG) x five, using left internal mammary artery and right leg greater saphenous vein harvested endoscopically;  Surgeon: Grace Isaac, MD;  Location: [facility];  Service: Open Heart Surgery;  Laterality: N/A;  . INTRAOPERATIVE TRANSESOPHAGEAL ECHOCARDIOGRAM N/A 03/01/2014   Procedure: INTRAOPERATIVE TRANSESOPHAGEAL ECHOCARDIOGRAM;  Surgeon: Grace Isaac, MD;  Location: [facility];  Service: Open Heart Surgery;  Laterality: N/A;  . LEFT AND RIGHT HEART CATHETERIZATION WITH CORONARY ANGIOGRAM N/A 02/26/2014   Procedure: LEFT AND RIGHT HEART CATHETERIZATION WITH CORONARY ANGIOGRAM;  Surgeon: Sinclair Grooms, MD;  Location: [facility] CATH LAB;  Service: Cardiovascular;  Laterality: N/A;  . TOTAL ABDOMINAL HYSTERECTOMY W/ BILATERAL SALPINGOOPHORECTOMY  1996    Current Medications: Outpatient Medications Prior to Visit  Medication Sig Dispense Refill  . aspirin 81 MG tablet Take 1 tablet (81 mg total) by mouth daily.    Marland Kitchen atorvastatin (LIPITOR) 80 MG tablet Take 1 tablet (80 mg total) by mouth daily. 90 tablet 3  . blood glucose meter kit and supplies KIT Dispense based on patient and insurance preference. Use up to four times daily as directed. (FOR ICD-9 250.00, 250.01). 1 each 0  .  carvedilol (COREG) 25 MG tablet Take 1 tablet (25 mg total) by mouth 2 (two) times daily with a meal. 180 tablet 1  . glucose blood (ACCU-CHEK ACTIVE STRIPS) test strip Use as instructed 100 each 12  . hydrALAZINE (APRESOLINE) 10 MG tablet Take 1 tablet (10 mg total) by mouth 3 (three) times daily. 90 tablet 3  . hydrochlorothiazide (HYDRODIURIL) 25 MG  tablet Take 1 tablet (25 mg total) by mouth daily. 90 tablet 3  . isosorbide mononitrate (IMDUR) 30 MG 24 hr tablet Take 1 tablet (30 mg total) by mouth daily. 30 tablet 3  . LANCETS MICRO THIN 33G MISC 1 Units by Does not apply route daily. 100 each 12  . lisinopril (PRINIVIL,ZESTRIL) 40 MG tablet TAKE 1 TABLET (40 MG TOTAL) BY MOUTH DAILY. 30 tablet 5   No facility-administered medications prior to visit.      Allergies:   Patient has no known allergies.   Social History   Social History  . Marital status: Widowed    Spouse name: N/A  . Number of children: N/A  . Years of education: N/A   Social History Main Topics  . Smoking status: Never Smoker  . Smokeless tobacco: Never Used  . Alcohol use No  . Drug use: No  . Sexual activity: Not on file   Other Topics Concern  . Not on file   Social History Narrative   Married x 42 yrs.  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   Never smoked. Doesn't drink or use drugs.   Does Patient Exercise:  yes - sometimes      To get diabetes supplies for [name] RD, CDE, April 20,2011     Family History:  The patient's ***family history includes Diabetes in her brother and mother; Heart failure in her brother, mother, and sister; Kidney failure in her mother.   ROS:   Please see the history of present illness.    ROS All other systems reviewed and are negative.   PHYSICAL EXAM:   VS:  There were no vitals taken for this visit.   GEN: Well nourished, well developed, in no acute distress  HEENT: normal  Neck: no JVD, carotid bruits, or masses Cardiac: ***RRR; no murmurs, rubs, or gallops,no edema  Respiratory:  clear to auscultation bilaterally, normal work of breathing GI: soft, nontender, nondistended, + BS MS: no deformity or atrophy  Skin: warm and dry, no rash Neuro:  Alert and Oriented x 3, Strength and sensation are intact Psych: euthymic mood, full affect  Wt  Readings from Last 3 Encounters:  01/11/16 168 lb 6 oz (76.4 kg)  12/16/15 168 lb 3.2 oz (76.3 kg)  12/12/15 167 lb 4 oz (75.9 kg)      Studies/Labs Reviewed:   EKG:  EKG is*** ordered today.  The ekg ordered today demonstrates ***  Recent Labs: 08/18/2015: ALT 14 09/01/2015: BUN 16; Creatinine, Ser 1.03; Potassium 4.8; Sodium 145   Lipid Panel    Component Value Date/Time   CHOL 169 08/18/2015 1135   TRIG 107 08/18/2015 1135   HDL 63 08/18/2015 1135   CHOLHDL 2.7 08/18/2015 1135   VLDL 21 08/18/2015 1135   LDLCALC 85 08/18/2015 1135    Additional studies/ records that were reviewed today include:  ***    ASSESSMENT:    No diagnosis found.   PLAN:  In order of problems listed above:  1. ***    Medication Adjustments/Labs and  Tests Ordered: Current medicines are reviewed at length with the patient today.  Concerns regarding medicines are outlined above.  Medication changes, Labs and Tests ordered today are listed in the Patient Instructions below. There are no Patient Instructions on file for this visit.   Hilbert Corrigan, [place]  03/28/2016 8:29 AM    [address], [place], [place]  [address] Phone: [phone]; Fax: [phone]

## 2016-03-28 NOTE — Progress Notes (Signed)
This encounter was created in error - please disregard.

## 2016-03-28 NOTE — Patient Instructions (Signed)
NO CHANGE KEEP UP COMING APPOINTMENT WITH DR Stanford Breed

## 2016-03-28 NOTE — Telephone Encounter (Signed)
[name] is a pleasant AA female with PMH of CAD s/p CABG and ischemic cardiomyopathy with EF 25-30%. She has a history of noncompliance. She was seen in September 2017, at which time, she has not taken her medication for over 2 months. Since then, she has been restarted on her previous medications. In effort to control her blood pressure, I have adjusted her medication in November and also December as well. She was finally on appropriate heart failure medications.  She presents today for 2 month follow-up, however, she mentions she has not taken any of her medication for the past 2 month. When I asked her the reason for discontinuing her medication, she says she felt better without taking the medication. At first I thought she was having side effect, however she also says she did not have any side effect while on the medications, she just prefer not to take them. I asked her if she would be more compliant if I reduce all of her medication to once a day which she can take in the morning, she says she still would not take them. She is a very pleasant about all this. I further discussed with her that the reason she is taking all these medications is not just for her blood pressure but also because of her weakened heart. She understands that her heart can become weak again or remain weak without the medications however reemphasized she has chosen not to take them. I have discussed with Dr. Gwenlyn Found, DOD, since today's visit is a blood pressure check and titration visit, there is nothing more we can do for her at this point. She says she has been feeling very well for the past 2 months on no medications. I will inform her primary cardiologist Dr. Stanford Breed with whom she is scheduled to see in April.  Note, despite noncompliance with medications, she has always been on time for her follow-up. However she understands that I cannot do much medication titration if she is not taking the medications. Today's visit has  been cancelled, and she was not charged.   Hilbert Corrigan PA Pager: [phone]

## 2016-05-16 NOTE — Progress Notes (Signed)
HPI: FU CAD; previously admitted with CHF; echo 1/16 showed EF 25-30, mild to moderate MR, mild LAE/RAE/RVE; moderately reduced RV function; small pericardial effusion. Carotid dopplers 1/16 showed 1-39 bilateral stenosis. Cath revealed 3 vessel CAD and EF 30-35. Had CABG with LIMA to LAD, seq svg to first and second diagonal, svg to OM, svg to PDA. Echocardiogram repeated June 2016 and showed ejection fraction 25-30%. Grade 1 diastolic dysfunction, mild mitral regurgitation. There is a density at the IVC and chest CT recommended. CT showed no mass. Patient referred to electrophysiology for consideration of ICD but patient declined. Since last seen, she denies dyspnea, chest pain, palpitations or syncope. No increased pedal edema. She is not taking any of her medications as she states she feels better off of them.  Current Outpatient Prescriptions  Medication Sig Dispense Refill  . aspirin 81 MG tablet Take 1 tablet (81 mg total) by mouth daily.    Marland Kitchen atorvastatin (LIPITOR) 80 MG tablet Take 1 tablet (80 mg total) by mouth daily. 90 tablet 3  . blood glucose meter kit and supplies KIT Dispense based on patient and insurance preference. Use up to four times daily as directed. (FOR ICD-9 250.00, 250.01). 1 each 0  . carvedilol (COREG) 25 MG tablet Take 1 tablet (25 mg total) by mouth 2 (two) times daily with a meal. 180 tablet 1  . glucose blood (ACCU-CHEK ACTIVE STRIPS) test strip Use as instructed 100 each 12  . hydrALAZINE (APRESOLINE) 10 MG tablet Take 1 tablet (10 mg total) by mouth 3 (three) times daily. 90 tablet 3  . LANCETS MICRO THIN 33G MISC 1 Units by Does not apply route daily. 100 each 12  . lisinopril (PRINIVIL,ZESTRIL) 40 MG tablet TAKE 1 TABLET (40 MG TOTAL) BY MOUTH DAILY. 30 tablet 5  . hydrochlorothiazide (HYDRODIURIL) 25 MG tablet Take 1 tablet (25 mg total) by mouth daily. 90 tablet 3  . isosorbide mononitrate (IMDUR) 30 MG 24 hr tablet Take 1 tablet (30 mg total) by mouth  daily. (Patient not taking: Reported on 03/28/2016) 30 tablet 3   No current facility-administered medications for this visit.      Past Medical History:  Diagnosis Date  . Bilateral pleural effusion 02/22/2014  . Diabetes mellitus 1998   Dx in 1998. Microalbuminuria, never on insulin.  Marland Kitchen GERD 12/05/2005   Qualifier: Diagnosis of  By: Prudencio Burly MD, Phillip Heal    . GERD (gastroesophageal reflux disease)   . History of blurry vision 06/09   Hospitalized for this  . Hyperlipidemia   . Hypertension   . Personal history of colonic adenomas 10/07/2007  . Pneumonia 01/2014    Past Surgical History:  Procedure Laterality Date  . CORONARY ARTERY BYPASS GRAFT N/A 03/01/2014   Procedure: CORONARY ARTERY BYPASS GRAFTING (CABG) x five, using left internal mammary artery and right leg greater saphenous vein harvested endoscopically;  Surgeon: Grace Isaac, MD;  Location: [facility];  Service: Open Heart Surgery;  Laterality: N/A;  . INTRAOPERATIVE TRANSESOPHAGEAL ECHOCARDIOGRAM N/A 03/01/2014   Procedure: INTRAOPERATIVE TRANSESOPHAGEAL ECHOCARDIOGRAM;  Surgeon: Grace Isaac, MD;  Location: [facility];  Service: Open Heart Surgery;  Laterality: N/A;  . LEFT AND RIGHT HEART CATHETERIZATION WITH CORONARY ANGIOGRAM N/A 02/26/2014   Procedure: LEFT AND RIGHT HEART CATHETERIZATION WITH CORONARY ANGIOGRAM;  Surgeon: Sinclair Grooms, MD;  Location: [facility] CATH LAB;  Service: Cardiovascular;  Laterality: N/A;  . TOTAL ABDOMINAL HYSTERECTOMY W/ BILATERAL SALPINGOOPHORECTOMY  1996    Social  History   Social History  . Marital status: Widowed    Spouse name: N/A  . Number of children: N/A  . Years of education: N/A   Occupational History  . Not on file.   Social History Main Topics  . Smoking status: Never Smoker  . Smokeless tobacco: Never Used  . Alcohol use No  . Drug use: No  . Sexual activity: Not on file   Other Topics Concern  . Not on file   Social History Narrative   Married x 42 yrs.  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   Never smoked. Doesn't drink or use drugs.   Does Patient Exercise:  yes - sometimes      To get diabetes supplies for [name] RD, CDE, April 20,2011    Family History  Problem Relation Age of Onset  . Diabetes Mother     Mother died of MI at age 54  . Heart failure Mother   . Kidney failure Mother   . Heart failure Sister     Died  . Heart failure Brother     Died  . Diabetes Brother     Died    ROS: no fevers or chills, productive cough, hemoptysis, dysphasia, odynophagia, melena, hematochezia, dysuria, hematuria, rash, seizure activity, orthopnea, PND, pedal edema, claudication. Remaining systems are negative.  Physical Exam: Well-developed well-nourished in no acute distress.  Skin is warm and dry.  HEENT is normal.  Neck is supple.  Chest is clear to auscultation with normal expansion.  Cardiovascular exam is regular rate and rhythm.  Abdominal exam nontender or distended. No masses palpated. Extremities show trace edema. neuro grossly intact  ECG- sinus rhythm, nonspecific ST changes. personally reviewed  A/P  1 Coronary artery disease-Resume aspirin and statin.  2 ischemic patient again not taking her medications. I'll ask her to resume carvedilol 25 mg twice a day and lisinopril 40 mg daily. I stressed compliance. Potassium and renal function in 4 weeks. She continues to decline (and would not be a candidate unless she demonstrates compliance) ICD and understands the risk of sudden death.  3 chronic systolic congestive heart failure-patient is not volume overloaded on examination. Continue low sodium diet and fluid restriction.  4 hyperlipidemia-resume lipitor 80 mg daily; lipids and liver 4 weeks.  5 hypertension-blood pressure elevated but she has discontinued her medications. Resume carvedilol and lisinopril and follow. I would like to simplify regimen as much as  possible.  6 noncompliance-we again discussed importance of compliance.  Kirk Ruths, MD

## 2016-05-21 ENCOUNTER — Telehealth: Payer: Self-pay | Admitting: Internal Medicine

## 2016-05-21 NOTE — Telephone Encounter (Signed)
rtc lm for rtc 

## 2016-05-21 NOTE — Telephone Encounter (Signed)
NP with [facility] calling please call  back

## 2016-05-21 NOTE — Telephone Encounter (Signed)
Urine dipstick Friday 4/20, [facility] NP went visit pt in home and did a routine urine dip, it showed 2+ protein, Neg glucose, she just wanted to make you aware of this.  She also states pt is noncompliant in taking her meds, pt stated to NP that she does not think she needs to take all the medication

## 2016-05-21 NOTE — Telephone Encounter (Signed)
She need to make an appointment to be seen. I have not seen her since August.

## 2016-05-24 ENCOUNTER — Encounter: Payer: Self-pay | Admitting: Cardiology

## 2016-05-24 ENCOUNTER — Ambulatory Visit (INDEPENDENT_AMBULATORY_CARE_PROVIDER_SITE_OTHER): Payer: Medicare Other | Admitting: Cardiology

## 2016-05-24 VITALS — BP 155/97 | HR 91 | Ht 62.0 in | Wt 164.4 lb

## 2016-05-24 DIAGNOSIS — I2510 Atherosclerotic heart disease of native coronary artery without angina pectoris: Secondary | ICD-10-CM | POA: Diagnosis not present

## 2016-05-24 DIAGNOSIS — E785 Hyperlipidemia, unspecified: Secondary | ICD-10-CM | POA: Diagnosis not present

## 2016-05-24 DIAGNOSIS — I255 Ischemic cardiomyopathy: Secondary | ICD-10-CM | POA: Diagnosis not present

## 2016-05-24 DIAGNOSIS — I10 Essential (primary) hypertension: Secondary | ICD-10-CM

## 2016-05-24 MED ORDER — ATORVASTATIN CALCIUM 80 MG PO TABS
80.0000 mg | ORAL_TABLET | Freq: Every day | ORAL | 3 refills | Status: DC
Start: 2016-05-24 — End: 2017-06-19

## 2016-05-24 MED ORDER — CARVEDILOL 25 MG PO TABS: 25.0000 mg | ORAL_TABLET | Freq: Two times a day (BID) | ORAL | 3 refills | Status: DC

## 2016-05-24 MED ORDER — LISINOPRIL 40 MG PO TABS: ORAL_TABLET | ORAL | 3 refills | Status: DC

## 2016-05-24 NOTE — Patient Instructions (Signed)
Medication Instructions:   RESTART ASPIRIN 81 MG ONCE DAILY  START ATORVASTATIN 80 MG ONCE DAILY  START LISINOPRIL 40 MG ONCE DAILY  START  CARVEDILOL 25 MG TWICE DAILY  Labwork:  Your physician recommends that you return for lab work in: 4 WEEKS= [facility]= DO NOT EAT PRIOR TO LAB WORK  Follow-Up:  Your physician recommends that you schedule a follow-up appointment in: [place]   If you need a refill on your cardiac medications before your next appointment, please call your pharmacy.

## 2016-05-31 ENCOUNTER — Telehealth: Payer: Self-pay | Admitting: Internal Medicine

## 2016-05-31 NOTE — Telephone Encounter (Signed)
APT. REMINDER CALL, PHONE NOT IN SERVICE °

## 2016-06-01 ENCOUNTER — Ambulatory Visit (INDEPENDENT_AMBULATORY_CARE_PROVIDER_SITE_OTHER): Payer: Medicare Other | Admitting: Internal Medicine

## 2016-06-01 VITALS — BP 128/61 | HR 70 | Temp 98.1°F | Wt 172.9 lb

## 2016-06-01 DIAGNOSIS — Z79899 Other long term (current) drug therapy: Secondary | ICD-10-CM

## 2016-06-01 DIAGNOSIS — Z951 Presence of aortocoronary bypass graft: Secondary | ICD-10-CM

## 2016-06-01 DIAGNOSIS — E119 Type 2 diabetes mellitus without complications: Secondary | ICD-10-CM | POA: Diagnosis not present

## 2016-06-01 DIAGNOSIS — Z7982 Long term (current) use of aspirin: Secondary | ICD-10-CM

## 2016-06-01 DIAGNOSIS — I10 Essential (primary) hypertension: Secondary | ICD-10-CM | POA: Diagnosis not present

## 2016-06-01 DIAGNOSIS — I255 Ischemic cardiomyopathy: Secondary | ICD-10-CM

## 2016-06-01 LAB — POCT GLYCOSYLATED HEMOGLOBIN (HGB A1C): HEMOGLOBIN A1C: 6.2

## 2016-06-01 LAB — GLUCOSE, CAPILLARY: GLUCOSE-CAPILLARY: 151 mg/dL — AB (ref 65–99)

## 2016-06-01 NOTE — Assessment & Plan Note (Addendum)
Last hgba1c 7.3 which improved from before. Had diarrhea with metformin and she did not want to be on meds previously so currently just doing diet control.   Today hgba1c 6.2. Congratulated her on the good job with diet and lifestyle changes she made.  f/up in 3 months.

## 2016-06-01 NOTE — Assessment & Plan Note (Signed)
Vitals:   06/01/16 1315 06/01/16 1332  BP: (!) 141/75 128/61  Pulse: 77 70  Temp: 98.1 F (36.7 C)    BP remains well controlled today on lisinopril 40mg  daily and coreg 25mg  bid. Has BMET planned to be done with cardiology office in 3 weeks, so will not get it today.  f/up with PCP in 3 months.

## 2016-06-01 NOTE — Progress Notes (Signed)
   CC: f/up for DM II and HTN  HPI:  [name] is a 66 y.o. with PMh as listed below is here for f/up of DM II and HTN   stopped taking all of her meds but restarted them after seeing the cardiologistst.  On asa 81, lipitor, lisinopril 40mg  daily, coreg 25 mg bid,  Follows with Dr. Stanford Breed  EF last 25-30% on repeat ECHO 2/2 to ischemic cardiomyopathy s/p CABG. Has been refusing ICD.   Past Medical History:  Diagnosis Date  . Bilateral pleural effusion 02/22/2014  . Diabetes mellitus 1998   Dx in 1998. Microalbuminuria, never on insulin.  Marland Kitchen GERD 12/05/2005   Qualifier: Diagnosis of  By: Prudencio Burly MD, Phillip Heal    . GERD (gastroesophageal reflux disease)   . History of blurry vision 06/09   Hospitalized for this  . Hyperlipidemia   . Hypertension   . Personal history of colonic adenomas 10/07/2007  . Pneumonia 01/2014   DM II - last hgab1c 7.2, diet controlled, not on meds. Due for hgba1c today.6.2 today.   HTN - doing well with meds, no complaints  Review of Systems:    Review of Systems  Constitutional: Negative for chills and fever.  Cardiovascular: Negative for chest pain and palpitations.  Neurological: Negative for dizziness and headaches.    Physical Exam:  Vitals:   06/01/16 1315  BP: (!) 141/75  Pulse: 77  Temp: 98.1 F (36.7 C)  TempSrc: Oral  SpO2: 100%  Weight: 172 lb 14.4 oz (78.4 kg)   Physical Exam  Constitutional: She is oriented to person, place, and time. She appears well-developed and well-nourished.  HENT:  Head: Normocephalic and atraumatic.  Cardiovascular: Normal rate and regular rhythm.  Exam reveals no gallop and no friction rub.   No murmur heard. Respiratory: Effort normal and breath sounds normal.  Surgical scar on chest.   Musculoskeletal:  1+ edema b/l on lower exts  Neurological: She is alert and oriented to person, place, and time.     Assessment & Plan:   See Encounters Tab for problem based charting.  Patient  discussed with Dr. Daryll Drown

## 2016-06-01 NOTE — Patient Instructions (Signed)
Take your current medications.  You are doing a great job!  Follow up with your primary doctor in 3 months.  Go to lab visit with heart doctor.

## 2016-06-03 NOTE — Progress Notes (Signed)
Internal Medicine Clinic Attending  Case discussed with Dr. Ahmed soon after the resident saw the patient.  We reviewed the resident's history and exam and pertinent patient test results.  I agree with the assessment, diagnosis, and plan of care documented in the resident's note. 

## 2016-06-26 ENCOUNTER — Other Ambulatory Visit: Payer: Self-pay | Admitting: Cardiology

## 2016-06-26 LAB — LIPID PANEL
CHOL/HDL RATIO: 2.3 ratio (ref <5.0)
Cholesterol: 178 mg/dL (ref <200)
HDL: 79 mg/dL (ref >50)
LDL CALC: 86 mg/dL (ref <100)
TRIGLYCERIDES: 67 mg/dL (ref <150)
VLDL: 13 mg/dL (ref <30)

## 2016-06-26 LAB — BASIC METABOLIC PANEL
BUN: 23 mg/dL (ref 7–25)
CHLORIDE: 108 mmol/L (ref 98–110)
CO2: 24 mmol/L (ref 20–31)
Calcium: 8.9 mg/dL (ref 8.6–10.4)
Creat: 1.14 mg/dL — ABNORMAL HIGH (ref 0.50–0.99)
Glucose, Bld: 114 mg/dL — ABNORMAL HIGH (ref 65–99)
Potassium: 4.9 mmol/L (ref 3.5–5.3)
SODIUM: 141 mmol/L (ref 135–146)

## 2016-06-26 LAB — HEPATIC FUNCTION PANEL
ALBUMIN: 3.4 g/dL — AB (ref 3.6–5.1)
ALT: 12 U/L (ref 6–29)
AST: 20 U/L (ref 10–35)
Alkaline Phosphatase: 99 U/L (ref 33–130)
BILIRUBIN TOTAL: 0.2 mg/dL (ref 0.2–1.2)
Bilirubin, Direct: 0 mg/dL (ref <=0.2)
Indirect Bilirubin: 0.2 mg/dL (ref 0.2–1.2)
TOTAL PROTEIN: 6.7 g/dL (ref 6.1–8.1)

## 2016-06-27 ENCOUNTER — Encounter: Payer: Self-pay | Admitting: *Deleted

## 2016-07-09 ENCOUNTER — Encounter: Payer: Self-pay | Admitting: *Deleted

## 2016-08-04 ENCOUNTER — Inpatient Hospital Stay (HOSPITAL_COMMUNITY)
Admission: EM | Admit: 2016-08-04 | Discharge: 2016-08-08 | DRG: 418 | Disposition: A | Discharge status: Home / Self Care | Payer: Medicare Other | Attending: Internal Medicine | Admitting: Internal Medicine

## 2016-08-04 ENCOUNTER — Encounter (HOSPITAL_COMMUNITY): Payer: Self-pay | Admitting: Emergency Medicine

## 2016-08-04 ENCOUNTER — Emergency Department (HOSPITAL_COMMUNITY): Payer: Medicare Other

## 2016-08-04 DIAGNOSIS — Z951 Presence of aortocoronary bypass graft: Secondary | ICD-10-CM | POA: Diagnosis not present

## 2016-08-04 DIAGNOSIS — K219 Gastro-esophageal reflux disease without esophagitis: Secondary | ICD-10-CM | POA: Diagnosis present

## 2016-08-04 DIAGNOSIS — E1122 Type 2 diabetes mellitus with diabetic chronic kidney disease: Secondary | ICD-10-CM

## 2016-08-04 DIAGNOSIS — Z833 Family history of diabetes mellitus: Secondary | ICD-10-CM

## 2016-08-04 DIAGNOSIS — Z841 Family history of disorders of kidney and ureter: Secondary | ICD-10-CM

## 2016-08-04 DIAGNOSIS — E669 Obesity, unspecified: Secondary | ICD-10-CM | POA: Diagnosis present

## 2016-08-04 DIAGNOSIS — N179 Acute kidney failure, unspecified: Secondary | ICD-10-CM | POA: Diagnosis not present

## 2016-08-04 DIAGNOSIS — E119 Type 2 diabetes mellitus without complications: Secondary | ICD-10-CM | POA: Diagnosis present

## 2016-08-04 DIAGNOSIS — Z90710 Acquired absence of both cervix and uterus: Secondary | ICD-10-CM | POA: Diagnosis not present

## 2016-08-04 DIAGNOSIS — I10 Essential (primary) hypertension: Secondary | ICD-10-CM | POA: Diagnosis present

## 2016-08-04 DIAGNOSIS — I2510 Atherosclerotic heart disease of native coronary artery without angina pectoris: Secondary | ICD-10-CM | POA: Diagnosis present

## 2016-08-04 DIAGNOSIS — Z8249 Family history of ischemic heart disease and other diseases of the circulatory system: Secondary | ICD-10-CM

## 2016-08-04 DIAGNOSIS — I5022 Chronic systolic (congestive) heart failure: Secondary | ICD-10-CM | POA: Diagnosis present

## 2016-08-04 DIAGNOSIS — Z7982 Long term (current) use of aspirin: Secondary | ICD-10-CM

## 2016-08-04 DIAGNOSIS — Z23 Encounter for immunization: Secondary | ICD-10-CM | POA: Diagnosis present

## 2016-08-04 DIAGNOSIS — Z90722 Acquired absence of ovaries, bilateral: Secondary | ICD-10-CM | POA: Diagnosis not present

## 2016-08-04 DIAGNOSIS — I110 Hypertensive heart disease with heart failure: Secondary | ICD-10-CM | POA: Diagnosis present

## 2016-08-04 DIAGNOSIS — Z6831 Body mass index (BMI) 31.0-31.9, adult: Secondary | ICD-10-CM

## 2016-08-04 DIAGNOSIS — E860 Dehydration: Secondary | ICD-10-CM | POA: Diagnosis not present

## 2016-08-04 DIAGNOSIS — Z419 Encounter for procedure for purposes other than remedying health state, unspecified: Secondary | ICD-10-CM

## 2016-08-04 DIAGNOSIS — E785 Hyperlipidemia, unspecified: Secondary | ICD-10-CM | POA: Diagnosis present

## 2016-08-04 DIAGNOSIS — I255 Ischemic cardiomyopathy: Secondary | ICD-10-CM | POA: Diagnosis present

## 2016-08-04 DIAGNOSIS — K5900 Constipation, unspecified: Secondary | ICD-10-CM | POA: Diagnosis present

## 2016-08-04 DIAGNOSIS — K851 Biliary acute pancreatitis without necrosis or infection: Secondary | ICD-10-CM | POA: Diagnosis present

## 2016-08-04 DIAGNOSIS — K8012 Calculus of gallbladder with acute and chronic cholecystitis without obstruction: Secondary | ICD-10-CM | POA: Diagnosis present

## 2016-08-04 DIAGNOSIS — K859 Acute pancreatitis without necrosis or infection, unspecified: Secondary | ICD-10-CM | POA: Diagnosis present

## 2016-08-04 LAB — COMPREHENSIVE METABOLIC PANEL
ALBUMIN: 3.1 g/dL — AB (ref 3.5–5.0)
ALT: 149 U/L — ABNORMAL HIGH (ref 14–54)
ANION GAP: 7 (ref 5–15)
AST: 285 U/L — ABNORMAL HIGH (ref 15–41)
Alkaline Phosphatase: 121 U/L (ref 38–126)
BUN: 25 mg/dL — ABNORMAL HIGH (ref 6–20)
CO2: 27 mmol/L (ref 22–32)
Calcium: 9 mg/dL (ref 8.9–10.3)
Chloride: 104 mmol/L (ref 101–111)
Creatinine, Ser: 1.05 mg/dL — ABNORMAL HIGH (ref 0.44–1.00)
GFR calc Af Amer: >60 mL/min (ref >60)
GFR calc non Af Amer: 54 mL/min — ABNORMAL LOW (ref >60)
GLUCOSE: 223 mg/dL — AB (ref 65–99)
POTASSIUM: 4.6 mmol/L (ref 3.5–5.1)
SODIUM: 138 mmol/L (ref 135–145)
Total Bilirubin: 1.2 mg/dL (ref 0.3–1.2)
Total Protein: 7.1 g/dL (ref 6.5–8.1)

## 2016-08-04 LAB — URINALYSIS, ROUTINE W REFLEX MICROSCOPIC
BACTERIA UA: NONE SEEN
Bilirubin Urine: NEGATIVE
Glucose, UA: 50 mg/dL — AB
Ketones, ur: NEGATIVE mg/dL
Leukocytes, UA: NEGATIVE
NITRITE: NEGATIVE
PH: 6 (ref 5.0–8.0)
Protein, ur: 100 mg/dL — AB
Specific Gravity, Urine: 1.014 (ref 1.005–1.030)

## 2016-08-04 LAB — CBC WITH DIFFERENTIAL/PLATELET
BASOS PCT: 0 %
Basophils Absolute: 0 10*3/uL (ref 0.0–0.1)
Eosinophils Absolute: 0.2 10*3/uL (ref 0.0–0.7)
Eosinophils Relative: 3 %
HCT: 29.8 % — ABNORMAL LOW (ref 36.0–46.0)
Hemoglobin: 9.7 g/dL — ABNORMAL LOW (ref 12.0–15.0)
LYMPHS ABS: 1.3 10*3/uL (ref 0.7–4.0)
Lymphocytes Relative: 17 %
MCH: 27.6 pg (ref 26.0–34.0)
MCHC: 32.6 g/dL (ref 30.0–36.0)
MCV: 84.9 fL (ref 78.0–100.0)
MONOS PCT: 1 %
Monocytes Absolute: 0.1 10*3/uL (ref 0.1–1.0)
NEUTROS ABS: 6.1 10*3/uL (ref 1.7–7.7)
NEUTROS PCT: 79 %
Platelets: 139 10*3/uL — ABNORMAL LOW (ref 150–400)
RBC: 3.51 MIL/uL — AB (ref 3.87–5.11)
RDW: 14.1 % (ref 11.5–15.5)
WBC: 7.7 10*3/uL (ref 4.0–10.5)

## 2016-08-04 LAB — CBG MONITORING, ED: GLUCOSE-CAPILLARY: 151 mg/dL — AB (ref 65–99)

## 2016-08-04 LAB — LIPASE, BLOOD: Lipase: 1297 U/L — ABNORMAL HIGH (ref 11–51)

## 2016-08-04 MED ORDER — SODIUM CHLORIDE 0.9 % IV SOLN: INTRAVENOUS | Status: DC

## 2016-08-04 MED ORDER — ENOXAPARIN SODIUM 40 MG/0.4ML ~~LOC~~ SOLN
40.0000 mg | SUBCUTANEOUS | Status: DC
  Administered 2016-08-04: 40 mg via SUBCUTANEOUS
  Filled 2016-08-04: qty 0.4

## 2016-08-04 MED ORDER — LACTATED RINGERS IV SOLN
INTRAVENOUS | Status: DC
  Administered 2016-08-04 – 2016-08-05 (×2): via INTRAVENOUS

## 2016-08-04 MED ORDER — PNEUMOCOCCAL VAC POLYVALENT 25 MCG/0.5ML IJ INJ: 0.5000 mL | INJECTION | INTRAMUSCULAR | Status: DC | PRN

## 2016-08-04 MED ORDER — PROMETHAZINE HCL 25 MG PO TABS
12.5000 mg | ORAL_TABLET | Freq: Four times a day (QID) | ORAL | Status: DC | PRN
  Administered 2016-08-04: 12.5 mg via ORAL
  Filled 2016-08-04: qty 1

## 2016-08-04 MED ORDER — HYDROMORPHONE HCL 1 MG/ML IJ SOLN
0.5000 mg | INTRAMUSCULAR | Status: DC | PRN
  Administered 2016-08-04: 0.5 mg via INTRAVENOUS
  Filled 2016-08-04: qty 0.5

## 2016-08-04 MED ORDER — ONDANSETRON HCL 4 MG/2ML IJ SOLN
4.0000 mg | Freq: Once | INTRAMUSCULAR | Status: AC
  Administered 2016-08-04: 4 mg via INTRAVENOUS
  Filled 2016-08-04: qty 2

## 2016-08-04 MED ORDER — SODIUM CHLORIDE 0.9 % IV BOLUS (SEPSIS)
1000.0000 mL | Freq: Once | INTRAVENOUS | Status: AC
  Administered 2016-08-04: 1000 mL via INTRAVENOUS

## 2016-08-04 MED ORDER — MORPHINE SULFATE (PF) 4 MG/ML IV SOLN
4.0000 mg | INTRAVENOUS | Status: DC | PRN
  Administered 2016-08-04 – 2016-08-06 (×2): 4 mg via INTRAVENOUS
  Filled 2016-08-04 (×2): qty 1

## 2016-08-04 NOTE — Progress Notes (Signed)
Pt arrived onto unit. Has no c/o pain and rates her pain 0/10. Pt A&O x 4. Pt instructed to call for help when needed and verbalizes understanding.     4 eyes skin assessment done by Carolynne Edouard, RN and Jocelyn Lamer, RN.

## 2016-08-04 NOTE — H&P (Signed)
Date: 08/04/2016               Patient Name:  [name] MRN: [id number]  DOB: 02/06/1950 Age / Sex: 66 y.o., female   PCP: Neva Seat, MD         Medical Service: Internal Medicine Teaching Service         Attending Physician: Dr. Oval Linsey, MD    First Contact: Dr. Maricela Bo Pager: [phone]  Second Contact: Dr. Benjamine Mola Pager: [phone]       After Hours (After 5p/  First Contact Pager: [phone]  weekends / holidays): Second Contact Pager: [phone]   Chief Complaint: emesis and abdominal pain   History of Present Illness: [name] is a 66 yo f with pmh of dm, htn, and gerd who presented to [facility] with abdominal pain that started 7/4 following a cookout that she went to on 7/4. The abd pain is non-localized, 10/10 intensity, constant, aggravated when vomiting, and alleviated with pain mediation. She did not do anything to alleviate the pain at home. She has not had this type of pain in the past. She also states that she has emesis that started 7/5 white yellow in color and non-bloody and has been having loose stools on 7/6. She denies chest pain, sob, fevers, and chills.  ED course- ordered an US abdomen, got cbg, 0.9 nacl 1085ml started, dilaudid 0.5mg  given, zofran given  Meds:  Current Meds  Medication Sig  . aspirin 81 MG tablet Take 1 tablet (81 mg total) by mouth daily.  Marland Kitchen atorvastatin (LIPITOR) 80 MG tablet Take 1 tablet (80 mg total) by mouth daily.  . carvedilol (COREG) 25 MG tablet Take 1 tablet (25 mg total) by mouth 2 (two) times daily with a meal.  . lisinopril (PRINIVIL,ZESTRIL) 40 MG tablet TAKE 1 TABLET (40 MG TOTAL) BY MOUTH DAILY. (Patient taking differently: Take 40 mg by mouth daily. )     Allergies: Allergies as of 08/04/2016  . (No Known Allergies)   Past Medical History:  Diagnosis Date  . Bilateral pleural effusion 02/22/2014  . Diabetes mellitus 1998   Dx in 1998. Microalbuminuria, never on insulin.  Marland Kitchen GERD 12/05/2005   Qualifier:  Diagnosis of  By: Prudencio Burly MD, Phillip Heal    . GERD (gastroesophageal reflux disease)   . History of blurry vision 06/09   Hospitalized for this  . Hyperlipidemia   . Hypertension   . Personal history of colonic adenomas 10/07/2007  . Pneumonia 01/2014    Family History: Mother-diabetes, heart failure, kidney failure;died of mi at age 66 sisted-died of heart failure Brother-heart failure, diabetes; died   Social History:  Never smoker, no etoh use, no illicit drug use   Review of Systems: A complete ROS was negative except as per HPI.   Physical Exam: Blood pressure 134/64, pulse (!) 103, temperature 98.4 F (36.9 C), temperature source Oral, resp. rate 13, height 5\' 2"  (1.575 m), weight 172 lb (78 kg), SpO2 97 %.  Physical Exam  Constitutional: She is well-developed, well-nourished, and in no distress.  HENT:  Head: Normocephalic and atraumatic.  Cardiovascular: Normal rate, regular rhythm, normal heart sounds and intact distal pulses.   Pulmonary/Chest: Breath sounds normal.  Abdominal: Soft. Normal appearance and bowel sounds are normal. There is tenderness in the epigastric area.    EKG: personally reviewed my interpretation is sinus rhythm, prolonged pr interval, borderline t wave abnormalities, prolonged qt interval which decreased since last tracing  [place] abd: Ultrasound  is equivocal for acute cholecystitis. Findings which support the diagnosis include circumferential thickened gallbladder wall with internal striations, presence of cholelithiasis, and apparent early intrahepatic biliary ductal dilation. Sonographic Percell Miller sign is negative, however, the patient was administered pain medication. HIDA study may be useful to evaluate for acute cholecystitis, however, this may not characterize the hepatic ductal dilation, for which contrast-enhanced CT or MR would be more Appropriate.  Dg abd acute: no acute findings in the chest or abdomen  Assessment & Plan by  Problem:  Acute pancreatitis: Pt's lipase was 3x normal limit, she had epigastric pain, and elevated liver enzymes. Due to her risk factors of being female, age of 31, hx of diabetes, hyperlipidemia, obesity and reduced physical activity we had a high suspicion of cholelithiasis as the cause for her acute pancreatitis. Our suspicion was confirmed with ultrasound of the abdomen that showed circumferential thickening of the gallbladder wall, presence of cholelithiasis, and intrahepatic biliary ductal dilation. GI was consulted and they recommended lap chole prior to discharge.  -lactate ringer at 170mL/hr -zofran for nausea -NPO diet -Consider CT scan was ordered to r/o other obstructive processes   Hyperlipidemia: Well managed hyperlipidemia Lipid panel on 06/26/16 shows total choles 178, ldl=86, hdl=79, triglycerides=67 Did not order po home medication of statin.   CAD s/p CABG: No active chest pain or shortness of breath currently EKG revealed rolonged pr interval, borderline t wave abnormalities, prolonged qt interval which decreased since last tracing. Will ensure cleared from cardiology standpoint prior to possible lap chole.   Dispo: Admit patient to Inpatient with expected length of stay greater than 2 midnights.  SignedLars Mage, MD 08/04/2016, 1:53 PM  Pager: Pager: [phone]

## 2016-08-04 NOTE — ED Notes (Signed)
Patient transported to Ultrasound 

## 2016-08-04 NOTE — ED Notes (Signed)
Returned from ultrasound.

## 2016-08-04 NOTE — Consult Note (Signed)
Reason for Consult: pancreatitis Referring Physician: Maranda Silva is an 66 y.o. female.  HPI: 66 yo female presents with 2 days of abdominal pain. Pain began after having cookout food. She had profuse nausea and vomiting. She has never had pain like this before. The pain has gotten a little better over the last 48h but is still. No fevers or chills.  Past Medical History:  Diagnosis Date  . Bilateral pleural effusion 02/22/2014  . Diabetes mellitus 1998   Dx in 1998. Microalbuminuria, never on insulin.  Marland Kitchen GERD 12/05/2005   Qualifier: Diagnosis of  By: Prudencio Burly MD, Phillip Heal    . GERD (gastroesophageal reflux disease)   . History of blurry vision 06/09   Hospitalized for this  . Hyperlipidemia   . Hypertension   . Personal history of colonic adenomas 10/07/2007  . Pneumonia 01/2014    Past Surgical History:  Procedure Laterality Date  . CORONARY ARTERY BYPASS GRAFT N/A 03/01/2014   Procedure: CORONARY ARTERY BYPASS GRAFTING (CABG) x five, using left internal mammary artery and right leg greater saphenous vein harvested endoscopically;  Surgeon: Grace Isaac, MD;  Location: [place];  Service: Open Heart Surgery;  Laterality: N/A;  . INTRAOPERATIVE TRANSESOPHAGEAL ECHOCARDIOGRAM N/A 03/01/2014   Procedure: INTRAOPERATIVE TRANSESOPHAGEAL ECHOCARDIOGRAM;  Surgeon: Grace Isaac, MD;  Location: [place];  Service: Open Heart Surgery;  Laterality: N/A;  . LEFT AND RIGHT HEART CATHETERIZATION WITH CORONARY ANGIOGRAM N/A 02/26/2014   Procedure: LEFT AND RIGHT HEART CATHETERIZATION WITH CORONARY ANGIOGRAM;  Surgeon: Sinclair Grooms, MD;  Location: [facility] CATH LAB;  Service: Cardiovascular;  Laterality: N/A;  . TOTAL ABDOMINAL HYSTERECTOMY W/ BILATERAL SALPINGOOPHORECTOMY  1996    Family History  Problem Relation Age of Onset  . Diabetes Mother        Mother died of MI at age 79  . Heart failure Mother   . Kidney failure Mother   . Heart failure Sister        Died  . Heart failure  Brother        Died  . Diabetes Brother        Died    Social History:  reports that she has never smoked. She has never used smokeless tobacco. She reports that she does not drink alcohol or use drugs.  Allergies: No Known Allergies  Medications: I have reviewed the patient's current medications.  Results for orders placed or performed during the hospital encounter of 08/04/16 (from the past 48 hour(s))  Comprehensive metabolic panel     Status: Abnormal   Collection Time: 08/04/16  7:31 AM  Result Value Ref Range   Sodium 138 135 - 145 mmol/L   Potassium 4.6 3.5 - 5.1 mmol/L   Chloride 104 101 - 111 mmol/L   CO2 27 22 - 32 mmol/L   Glucose, Bld 223 (H) 65 - 99 mg/dL   BUN 25 (H) 6 - 20 mg/dL   Creatinine, Ser 1.05 (H) 0.44 - 1.00 mg/dL   Calcium 9.0 8.9 - 10.3 mg/dL   Total Protein 7.1 6.5 - 8.1 g/dL   Albumin 3.1 (L) 3.5 - 5.0 g/dL   AST 285 (H) 15 - 41 U/L   ALT 149 (H) 14 - 54 U/L   Alkaline Phosphatase 121 38 - 126 U/L   Total Bilirubin 1.2 0.3 - 1.2 mg/dL   GFR calc non Af Amer 54 (L) >60 mL/min   GFR calc Af Amer >60 >60 mL/min  Comment: (NOTE) The eGFR has been calculated using the CKD EPI equation. This calculation has not been validated in all clinical situations. eGFR's persistently <60 mL/min signify possible Chronic Kidney Disease.    Anion gap 7 5 - 15  Lipase, blood     Status: Abnormal   Collection Time: 08/04/16  7:31 AM  Result Value Ref Range   Lipase 1,297 (H) 11 - 51 U/L    Comment: RESULTS CONFIRMED BY MANUAL DILUTION  CBC WITH DIFFERENTIAL     Status: Abnormal   Collection Time: 08/04/16  7:31 AM  Result Value Ref Range   WBC 7.7 4.0 - 10.5 K/uL   RBC 3.51 (L) 3.87 - 5.11 MIL/uL   Hemoglobin 9.7 (L) 12.0 - 15.0 g/dL   HCT 29.8 (L) 36.0 - 46.0 %   MCV 84.9 78.0 - 100.0 fL   MCH 27.6 26.0 - 34.0 pg   MCHC 32.6 30.0 - 36.0 g/dL   RDW 14.1 11.5 - 15.5 %   Platelets 139 (L) 150 - 400 K/uL   Neutrophils Relative % 79 %   Neutro Abs 6.1 1.7  - 7.7 K/uL   Lymphocytes Relative 17 %   Lymphs Abs 1.3 0.7 - 4.0 K/uL   Monocytes Relative 1 %   Monocytes Absolute 0.1 0.1 - 1.0 K/uL   Eosinophils Relative 3 %   Eosinophils Absolute 0.2 0.0 - 0.7 K/uL   Basophils Relative 0 %   Basophils Absolute 0.0 0.0 - 0.1 K/uL  CBG monitoring, ED     Status: Abnormal   Collection Time: 08/04/16  8:26 AM  Result Value Ref Range   Glucose-Capillary 151 (H) 65 - 99 mg/dL  Urinalysis, Routine w reflex microscopic     Status: Abnormal   Collection Time: 08/04/16  9:28 AM  Result Value Ref Range   Color, Urine YELLOW YELLOW   APPearance CLEAR CLEAR   Specific Gravity, Urine 1.014 1.005 - 1.030   pH 6.0 5.0 - 8.0   Glucose, UA 50 (A) NEGATIVE mg/dL   Hgb urine dipstick SMALL (A) NEGATIVE   Bilirubin Urine NEGATIVE NEGATIVE   Ketones, ur NEGATIVE NEGATIVE mg/dL   Protein, ur 100 (A) NEGATIVE mg/dL   Nitrite NEGATIVE NEGATIVE   Leukocytes, UA NEGATIVE NEGATIVE   RBC / HPF 6-30 0 - 5 RBC/hpf   WBC, UA 0-5 0 - 5 WBC/hpf   Bacteria, UA NONE SEEN NONE SEEN   Squamous Epithelial / LPF 0-5 (A) NONE SEEN   Mucous PRESENT    Hyaline Casts, UA PRESENT     US Abdomen Complete  Result Date: 08/04/2016 CLINICAL DATA:  66 year old female with a history of abdominal pain EXAM: ABDOMEN ULTRASOUND COMPLETE COMPARISON:  No prior ultrasound, chest CT 08/25/2014 FINDINGS: Gallbladder: Multiple echogenic foci within the gallbladder lumen compatible with stones/ sludge. Circumferential gallbladder wall thickening of 9 mm -10 mm with internal striations. Equivocal sonographic Murphy's sign, with pain medication administered previous to the scan. Common bile duct: Diameter: 6 mm Liver: Unremarkable appearance of liver parenchyma. Evidence of early biliary ductal dilatation, which was not present on the comparison CT of 08/25/2014 IVC: No abnormality visualized. Pancreas: Visualized portion unremarkable. Spleen: Size and appearance within normal limits. Right Kidney:  Length: 10.4 cm. Symmetric echogenicity to the left, and similar to the adjacent liver parenchyma. No hydronephrosis. Anechoic cystic structure with through transmission measures 1 cm x 1 cm x 9 mm. Left Kidney: Length: 11.0 cm. Symmetric echogenicity to the right. No hydronephrosis. Anechoic cystic structure  with through transmission on the lateral cortex measures 12 mm x 12 minute by 14 mm. Additional small superior cortical lesion with through transmission measures 7 mm x 5 mm x 7 mm. Abdominal aorta: No aneurysm visualized. Other findings: None. IMPRESSION: Ultrasound is equivocal for acute cholecystitis. Findings which support the diagnosis include circumferential thickened gallbladder wall with internal striations, presence of cholelithiasis, and apparent early intrahepatic biliary ductal dilation. Sonographic Percell Miller sign is negative, however, the patient was administered pain medication. HIDA study may be useful to evaluate for acute cholecystitis, however, this may not characterize the hepatic ductal dilation, for which contrast-enhanced CT or MR would be more appropriate. Bilateral Bosniak 1 renal cysts. These results were called by telephone at the time of interpretation on 08/04/2016 at 12:22 pm to Dr. Dorie Rank , who verbally acknowledged these results. Electronically Signed   By: Corrie Mckusick D.O.   On: 08/04/2016 12:24   Dg Abd Acute W/chest  Result Date: 08/04/2016 CLINICAL DATA:  Nausea, vomiting and abdominal pain. EXAM: DG ABDOMEN ACUTE W/ 1V CHEST COMPARISON:  Chest x-ray on 04/08/2014 FINDINGS: Status post prior CABG. The heart size is normal. There is stable tortuosity of the thoracic aorta. There is no evidence of pulmonary edema, consolidation, pneumothorax, nodule or pleural fluid. Abdominal films show no evidence of acute bowel obstruction or significant ileus. No evidence of free intraperitoneal air. No abnormal calcifications identified. Bony structures are unremarkable. IMPRESSION: No  acute findings in the chest or abdomen. Electronically Signed   By: Aletta Edouard M.D.   On: 08/04/2016 08:17    Review of Systems  Constitutional: Negative for chills and fever.  HENT: Negative for hearing loss.   Eyes: Negative for blurred vision and double vision.  Respiratory: Negative for cough and hemoptysis.   Cardiovascular: Negative for chest pain and palpitations.  Gastrointestinal: Positive for abdominal pain, nausea and vomiting.  Genitourinary: Negative for dysuria and urgency.  Musculoskeletal: Negative for myalgias and neck pain.  Skin: Negative for itching and rash.  Neurological: Negative for dizziness, tingling and headaches.  Endo/Heme/Allergies: Does not bruise/bleed easily.  Psychiatric/Behavioral: Negative for depression and suicidal ideas.   Blood pressure (!) 112/55, pulse (!) 101, temperature 98.4 F (36.9 C), temperature source Oral, resp. rate 20, height _0  (1.575 m), weight 78 kg (172 lb), SpO2 96 %. Physical Exam  Vitals reviewed. Constitutional: She is oriented to person, place, and time. She appears well-developed and well-nourished.  HENT:  Head: Normocephalic and atraumatic.  Eyes: Conjunctivae and EOM are normal. Pupils are equal, round, and reactive to light.  Neck: Normal range of motion. Neck supple.  Cardiovascular: Normal rate and regular rhythm.   Respiratory: Effort normal and breath sounds normal.  GI: Soft. Bowel sounds are normal. She exhibits no distension. There is tenderness in the right upper quadrant. There is no guarding.  Musculoskeletal: Normal range of motion.  Neurological: She is alert and oriented to person, place, and time.  Skin: Skin is warm and dry.  Psychiatric: She has a normal mood and affect. Her behavior is normal.      Assessment/Plan: 66 yo female with pancreatitis and concern for gallbladder disease consistent with gallstone pancreatitis -surgery will follow along and patient would likely benefit from lap  chole prior to discharge -bowel rest -recheck cmp and lipase in am  [name] 08/04/2016, 3:17 PM

## 2016-08-04 NOTE — ED Triage Notes (Signed)
To ED via [facility] from home (lives with daughter) -- with c/o nausea and vomiting started Thursday after being at a cookout on Wednesday -- started having loose stools yesterday and worsening abd pain and unable to keep anything down. Having dry heaves on arrival-- received Zofran ODT enroute but vomited it .

## 2016-08-04 NOTE — ED Provider Notes (Signed)
[place] DEPT Provider Note   CSN: [id number] Arrival date & time: 08/04/16  0718     History   Chief Complaint Chief Complaint  Patient presents with  . Emesis  . Abdominal Pain    HPI [name] is a 66 y.o. female.  HPI Patient presents to the emergency room with complaints of nausea, vomiting, and abdominal pain. Patient states his symptoms started on Thursday. They have been progressively getting worse. This morning they were severe and she needed to come to the emergency room. Patient states she has had multiple episodes of vomiting. The emesis has been nonbloody. She lost track of how many times. The pain is in her bilateral upper abdomen. It does not radiate. She denies any diarrhea or constipation. She denies any dysuria. She denies any difficulty with chest pain or shortness of breath. No fevers or chills. No recent travel. No recent antibiotics. No known ill contacts. No recent suspicious foods Past Medical History:  Diagnosis Date  . Bilateral pleural effusion 02/22/2014  . Diabetes mellitus 1998   Dx in 1998. Microalbuminuria, never on insulin.  Marland Kitchen GERD 12/05/2005   Qualifier: Diagnosis of  By: Prudencio Burly MD, Phillip Heal    . GERD (gastroesophageal reflux disease)   . History of blurry vision 06/09   Hospitalized for this  . Hyperlipidemia   . Hypertension   . Personal history of colonic adenomas 10/07/2007  . Pneumonia 01/2014    Patient Active Problem List   Diagnosis Date Noted  . Acute pancreatitis 08/04/2016  . Anemia 08/24/2014  . Dyslipidemia 04/29/2014  . Preventative health care 04/29/2014  . Cardiomyopathy, ischemic, EF 25-30% 03/23/2014  . 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 Coronary artery disease involving native coronary artery of native heart without angina pectoris   . Personal history of colonic adenomas 10/07/2007  . MICROALBUMINURIA 07/02/2007  . Diabetes mellitus type 2, controlled, without complications ([place]) 85/46/2703  . Hyperlipidemia  12/05/2005  . Essential hypertension 12/05/2005    Past Surgical History:  Procedure Laterality Date  . CORONARY ARTERY BYPASS GRAFT N/A 03/01/2014   Procedure: CORONARY ARTERY BYPASS GRAFTING (CABG) x five, using left internal mammary artery and right leg greater saphenous vein harvested endoscopically;  Surgeon: Grace Isaac, MD;  Location: [place];  Service: Open Heart Surgery;  Laterality: N/A;  . INTRAOPERATIVE TRANSESOPHAGEAL ECHOCARDIOGRAM N/A 03/01/2014   Procedure: INTRAOPERATIVE TRANSESOPHAGEAL ECHOCARDIOGRAM;  Surgeon: Grace Isaac, MD;  Location: [place];  Service: Open Heart Surgery;  Laterality: N/A;  . LEFT AND RIGHT HEART CATHETERIZATION WITH CORONARY ANGIOGRAM N/A 02/26/2014   Procedure: LEFT AND RIGHT HEART CATHETERIZATION WITH CORONARY ANGIOGRAM;  Surgeon: Sinclair Grooms, MD;  Location[facility] C[facility] LAB;  Service: Cardiovascular;  Laterality: N/A;  . TOTAL ABDOMINAL HYSTERECTOMY W/ BILATERAL SALPINGOOPHORECTOMY  1996    OB History    No data available       Home Medications    Prior to Admission medications   Medication Sig Start Date End Date Taking? Authorizing Provider  aspirin 81 MG tablet Take 1 tablet (81 mg total) by mouth daily. 10/17/15  Yes Lelon Perla, MD  atorvastatin (LIPITOR) 80 MG tablet Take 1 tablet (80 mg total) by mouth daily. 05/24/16  Yes Lelon Perla, MD  carvedilol (COREG) 25 MG tablet Take 1 tablet (25 mg total) by mouth 2 (two) times daily with a meal. 05/24/16  Yes Crenshaw, Denice Bors, MD  lisinopril (PRINIVIL,ZESTRIL) 40 MG tablet TAKE 1 TABLET (40 MG TOTAL)  BY MOUTH DAILY. Patient taking differently: Take 40 mg by mouth daily.  05/24/16  Yes Lelon Perla, MD  glucose blood (ACCU-CHEK ACTIVE STRIPS) test strip Use as instructed Patient not taking: Reported on 08/04/2016 04/29/14   Bethena Roys, MD  LANCETS MICRO THIN 33G MISC 1 Units by Does not apply route daily. Patient not taking: Reported on 08/04/2016 04/29/14   Bethena Roys, MD    Family History Family History  Problem Relation Age of Onset  . Diabetes Mother        Mother died of MI at age 17  . Heart failure Mother   . Kidney failure Mother   . Heart failure Sister        Died  . Heart failure Brother        Died  . Diabetes Brother        Died    Social History Social History  Substance Use Topics  . Smoking status: Never Smoker  . Smokeless tobacco: Never Used  . Alcohol use No     Allergies   Patient has no known allergies.   Review of Systems Review of Systems  All other systems reviewed and are negative.    Physical Exam Updated Vital Signs BP 127/69 (BP Location: Right Arm)   Pulse 94   Temp 100 F (37.8 C)   Resp 18   Ht 1.575 m (5\' 2" )   Wt 77.5 kg (170 lb 14.4 oz)   SpO2 97%   BMI 31.26 kg/m   Physical Exam  Constitutional: She appears distressed.  Dry heaving and spitting up in an emesis bag  HENT:  Head: Normocephalic and atraumatic.  Right Ear: External ear normal.  Left Ear: External ear normal.  Eyes: Conjunctivae are normal. Right eye exhibits no discharge. Left eye exhibits no discharge. No scleral icterus.  Neck: Neck supple. No tracheal deviation present.  Cardiovascular: Normal rate, regular rhythm and intact distal pulses.   Well-healed midline sternotomy scar  Pulmonary/Chest: Effort normal and breath sounds normal. No stridor. No respiratory distress. She has no wheezes. She has no rales.  Abdominal: Soft. She exhibits no distension, no abdominal bruit, no pulsatile midline mass and no mass. Bowel sounds are decreased. There is tenderness in the right upper quadrant, epigastric area and left upper quadrant. There is no rigidity, no rebound and no guarding. No hernia.  Musculoskeletal: She exhibits no edema or tenderness.  Neurological: She is alert. She has normal strength. No cranial nerve deficit (no facial droop, extraocular movements intact, no slurred speech) or sensory deficit.  She exhibits normal muscle tone. She displays no seizure activity. Coordination normal.  Skin: Skin is warm and dry. No rash noted.  Psychiatric: She has a normal mood and affect.  Nursing note and vitals reviewed.    ED Treatments / Results  Labs (all labs ordered are listed, but only abnormal results are displayed) Labs Reviewed  COMPREHENSIVE METABOLIC PANEL - Abnormal; Notable for the following:       Result Value   Glucose, Bld 223 (*)    BUN 25 (*)    Creatinine, Ser 1.05 (*)    Albumin 3.1 (*)    AST 285 (*)    ALT 149 (*)    GFR calc non Af Amer 54 (*)    All other components within normal limits  LIPASE, BLOOD - Abnormal; Notable for the following:    Lipase 1,297 (*)    All other components within normal  limits  CBC WITH DIFFERENTIAL/PLATELET - Abnormal; Notable for the following:    RBC 3.51 (*)    Hemoglobin 9.7 (*)    HCT 29.8 (*)    Platelets 139 (*)    All other components within normal limits  URINALYSIS, ROUTINE W REFLEX MICROSCOPIC - Abnormal; Notable for the following:    Glucose, UA 50 (*)    Hgb urine dipstick SMALL (*)    Protein, ur 100 (*)    Squamous Epithelial / LPF 0-5 (*)    All other components within normal limits  CBC - Abnormal; Notable for the following:    RBC 3.04 (*)    Hemoglobin 8.4 (*)    HCT 26.1 (*)    All other components within normal limits  CBG MONITORING, ED - Abnormal; Notable for the following:    Glucose-Capillary 151 (*)    All other components within normal limits  COMPREHENSIVE METABOLIC PANEL  LIPASE, BLOOD    EKG  EKG Interpretation  Date/Time:  Saturday August 04 2016 07:25:52 EDT Ventricular Rate:  88 PR Interval:    QRS Duration: 98 QT Interval:  422 QTC Calculation: 511 R Axis:   15 Text Interpretation:  Sinus rhythm Prolonged PR interval Borderline T wave abnormalities Prolonged QT interval, decreased since last tracing Confirmed by Dorie Rank [phone]) on 08/04/2016 7:32:10 AM       Radiology US  Abdomen Complete  Result Date: 08/04/2016 CLINICAL DATA:  66 year old female with a history of abdominal pain EXAM: ABDOMEN ULTRASOUND COMPLETE COMPARISON:  No prior ultrasound, chest CT 08/25/2014 FINDINGS: Gallbladder: Multiple echogenic foci within the gallbladder lumen compatible with stones/ sludge. Circumferential gallbladder wall thickening of 9 mm -10 mm with internal striations. Equivocal sonographic Murphy's sign, with pain medication administered previous to the scan. Common bile duct: Diameter: 6 mm Liver: Unremarkable appearance of liver parenchyma. Evidence of early biliary ductal dilatation, which was not present on the comparison CT of 08/25/2014 IVC: No abnormality visualized. Pancreas: Visualized portion unremarkable. Spleen: Size and appearance within normal limits. Right Kidney: Length: 10.4 cm. Symmetric echogenicity to the left, and similar to the adjacent liver parenchyma. No hydronephrosis. Anechoic cystic structure with through transmission measures 1 cm x 1 cm x 9 mm. Left Kidney: Length: 11.0 cm. Symmetric echogenicity to the right. No hydronephrosis. Anechoic cystic structure with through transmission on the lateral cortex measures 12 mm x 12 minute by 14 mm. Additional small superior cortical lesion with through transmission measures 7 mm x 5 mm x 7 mm. Abdominal aorta: No aneurysm visualized. Other findings: None. IMPRESSION: Ultrasound is equivocal for acute cholecystitis. Findings which support the diagnosis include circumferential thickened gallbladder wall with internal striations, presence of cholelithiasis, and apparent early intrahepatic biliary ductal dilation. Sonographic Percell Miller sign is negative, however, the patient was administered pain medication. HIDA study may be useful to evaluate for acute cholecystitis, however, this may not characterize the hepatic ductal dilation, for which contrast-enhanced CT or MR would be more appropriate. Bilateral Bosniak 1 renal cysts. These  results were called by telephone at the time of interpretation on 08/04/2016 at 12:22 pm to Dr. Dorie Rank , who verbally acknowledged these results. Electronically Signed   By: Corrie Mckusick D.O.   On: 08/04/2016 12:24   Dg Abd Acute W/chest  Result Date: 08/04/2016 CLINICAL DATA:  Nausea, vomiting and abdominal pain. EXAM: DG ABDOMEN ACUTE W/ 1V CHEST COMPARISON:  Chest x-ray on 04/08/2014 FINDINGS: Status post prior CABG. The heart size is normal. There is stable  tortuosity of the thoracic aorta. There is no evidence of pulmonary edema, consolidation, pneumothorax, nodule or pleural fluid. Abdominal films show no evidence of acute bowel obstruction or significant ileus. No evidence of free intraperitoneal air. No abnormal calcifications identified. Bony structures are unremarkable. IMPRESSION: No acute findings in the chest or abdomen. Electronically Signed   By: Aletta Edouard M.D.   On: 08/04/2016 08:17    Procedures Procedures (including critical care time)  Medications Ordered in ED Medications  HYDROmorphone (DILAUDID) injection 0.5 mg (0.5 mg Intravenous Given 08/04/16 0744)  pneumococcal 23 valent vaccine (PNU-IMMUNE) injection 0.5 mL (not administered)  enoxaparin (LOVENOX) injection 40 mg (40 mg Subcutaneous Given 08/04/16 2203)  promethazine (PHENERGAN) tablet 12.5 mg (12.5 mg Oral Given 08/04/16 2034)  morphine 4 MG/ML injection 4 mg (4 mg Intravenous Given 08/04/16 1759)  lactated ringers infusion ( Intravenous New Bag/Given 08/05/16 0445)  sodium chloride 0.9 % bolus 1,000 mL (0 mLs Intravenous Stopped 08/04/16 0911)  ondansetron (ZOFRAN) injection 4 mg (4 mg Intravenous Given 08/04/16 0745)     Initial Impression / Assessment and Plan / ED Course  I have reviewed the triage vital signs and the nursing notes.  Pertinent labs & imaging results that were available during my care of the patient were reviewed by me and considered in my medical decision making (see chart for details).  Clinical  Course as of Aug 05 721  Sat Aug 04, 2016  3354 LFTs are elevated.  Hgb is low but this is similar to previous results.   Will US abdomen to evaluate for possible acute cholecystitis  [JK]  1310 Ultrasound shows possibility of acute cholecystitis. Patient's lipase levels also very elevated at 1297. She may have a common bile duct stone. I'll consult with general surgery. I will consult with gastroenterology and arrange for medical admission  [JK]    Clinical Course User Index [JK] Dorie Rank, MD     Final Clinical Impressions(s) / ED Diagnoses   Final diagnoses:  Acute pancreatitis, unspecified complication status, unspecified pancreatitis type      Dorie Rank, MD 08/05/16 [phone]

## 2016-08-05 DIAGNOSIS — Z841 Family history of disorders of kidney and ureter: Secondary | ICD-10-CM

## 2016-08-05 DIAGNOSIS — Z951 Presence of aortocoronary bypass graft: Secondary | ICD-10-CM

## 2016-08-05 DIAGNOSIS — Z8249 Family history of ischemic heart disease and other diseases of the circulatory system: Secondary | ICD-10-CM

## 2016-08-05 DIAGNOSIS — I5022 Chronic systolic (congestive) heart failure: Secondary | ICD-10-CM | POA: Diagnosis present

## 2016-08-05 DIAGNOSIS — I10 Essential (primary) hypertension: Secondary | ICD-10-CM

## 2016-08-05 DIAGNOSIS — I255 Ischemic cardiomyopathy: Secondary | ICD-10-CM

## 2016-08-05 DIAGNOSIS — E119 Type 2 diabetes mellitus without complications: Secondary | ICD-10-CM

## 2016-08-05 DIAGNOSIS — E785 Hyperlipidemia, unspecified: Secondary | ICD-10-CM

## 2016-08-05 DIAGNOSIS — E669 Obesity, unspecified: Secondary | ICD-10-CM

## 2016-08-05 DIAGNOSIS — I2510 Atherosclerotic heart disease of native coronary artery without angina pectoris: Secondary | ICD-10-CM

## 2016-08-05 DIAGNOSIS — Z833 Family history of diabetes mellitus: Secondary | ICD-10-CM

## 2016-08-05 DIAGNOSIS — K851 Biliary acute pancreatitis without necrosis or infection: Principal | ICD-10-CM

## 2016-08-05 LAB — COMPREHENSIVE METABOLIC PANEL
ALBUMIN: 2.4 g/dL — AB (ref 3.5–5.0)
ALT: 687 U/L — ABNORMAL HIGH (ref 14–54)
ANION GAP: 6 (ref 5–15)
AST: 889 U/L — ABNORMAL HIGH (ref 15–41)
Alkaline Phosphatase: 164 U/L — ABNORMAL HIGH (ref 38–126)
BUN: 18 mg/dL (ref 6–20)
CO2: 23 mmol/L (ref 22–32)
Calcium: 8.3 mg/dL — ABNORMAL LOW (ref 8.9–10.3)
Chloride: 108 mmol/L (ref 101–111)
Creatinine, Ser: 1.15 mg/dL — ABNORMAL HIGH (ref 0.44–1.00)
GFR calc non Af Amer: 48 mL/min — ABNORMAL LOW (ref >60)
GFR, EST AFRICAN AMERICAN: 56 mL/min — AB (ref >60)
GLUCOSE: 69 mg/dL (ref 65–99)
POTASSIUM: 3.7 mmol/L (ref 3.5–5.1)
SODIUM: 137 mmol/L (ref 135–145)
TOTAL PROTEIN: 5.7 g/dL — AB (ref 6.5–8.1)
Total Bilirubin: 2.2 mg/dL — ABNORMAL HIGH (ref 0.3–1.2)

## 2016-08-05 LAB — CBC
HCT: 26.1 % — ABNORMAL LOW (ref 36.0–46.0)
HEMOGLOBIN: 8.4 g/dL — AB (ref 12.0–15.0)
MCH: 27.6 pg (ref 26.0–34.0)
MCHC: 32.2 g/dL (ref 30.0–36.0)
MCV: 85.9 fL (ref 78.0–100.0)
PLATELETS: 109 10*3/uL — AB (ref 150–400)
RBC: 3.04 MIL/uL — AB (ref 3.87–5.11)
RDW: 14.6 % (ref 11.5–15.5)
WBC: 7.6 10*3/uL (ref 4.0–10.5)

## 2016-08-05 LAB — LIPASE, BLOOD: Lipase: 123 U/L — ABNORMAL HIGH (ref 11–51)

## 2016-08-05 NOTE — Progress Notes (Signed)
   Subjective: [name] was seen lying in her bed and doing well. [name] denied abdominal pain, sob, chest pain, nausea and vomiting.   Objective:  Vital signs in last 24 hours: Vitals:   08/04/16 1627 08/04/16 2136 08/05/16 0545 08/05/16 1631  BP: 132/69 (!) 130/51 127/69 (!) 145/68  Pulse: (!) 103 95 94 88  Resp: 16 18 18 15   Temp: (!) 100.6 F (38.1 C) (!) 100.5 F (38.1 C) 100 F (37.8 C) 100.2 F (37.9 C)  TempSrc: Oral     SpO2: 100% 97% 97% 98%  Weight: 170 lb 14.4 oz (77.5 kg)     Height: 5\' 2"  (1.575 m)      Physical Exam  Constitutional: [name] appears well-developed and well-nourished. No distress.  HENT:  Head: Normocephalic and atraumatic.  Cardiovascular: Normal rate and regular rhythm.   Pulmonary/Chest: Effort normal and breath sounds normal. No respiratory distress. [name] has no wheezes.  Abdominal: Soft. Bowel sounds are normal. [name] exhibits no distension. There is no tenderness.    Assessment/Plan: Acute gallstone pancreatitis -Imaging revealed circumferential gallbladder thickening, intrahepatic biliary ductal dilation, presence of cholelithiasis -Managed on 08/04/16 with npo order, lactate ringer 110ml/hr, and zofran.   -lipase decreased from 1300 to 123 on 08/05/16  -AST=889, ALT=687, ALP=164, total bili=2.2 are indicative of an obstructive cause of pancreatitis  likely caused by cholelithiasis   -surgery to do laprascopic cholecystectomy on 08/06/16  -cardiology consult for preop clearance: due to pt not having any chest pain currently and no  evidence of hf or volume overload cardiology cleared for surgery. Recommended keeping I=O and  restarting coreg, atorvastatin, aspirin, and lisinopril after her surgery.  -Dilaudid 0.5mg  iv and morphine 4 prn for pain   Diabetes mellitus type 2, controlled, without complications (HCC) -Pt on fluid diet till 0:00 08/06/16 then NPO -Blood glucose in normal range,continue Glucose checks  Coronary artery disease involving  native coronary artery of native heart without angina pectoris S/P CABG x 5 -No active chest pain or shortness of breath -No further management necessary at this time  Dispo: Anticipated discharge in approximately 2 day(s).   Lars Mage, MD Internal Medicine PGY1 Pager:[phone] 08/05/2016, 5:43 PM

## 2016-08-05 NOTE — Progress Notes (Signed)
Internal Medicine Attending  Date: 08/05/2016  Patient name: [name] Medical record number: [id number] Date of birth: Jun 11, 1950 Age: 66 y.o. Gender: female  I saw and evaluated the patient. I reviewed the resident's note by Dr. Maricela Bo and I agree with the resident's findings and plans as documented in her progress note.  Please see my H&P dated 08/05/2016 for the specifics of my evaluation, assessment, and plan from earlier in the day.

## 2016-08-05 NOTE — Progress Notes (Signed)
[facility] Progress Note    Subjective: Patient is pain free and has not eaten anything today.  No distress.  Has not been seen by Cardiology.  Objective: Vital signs in last 24 hours: Temp:  [100 F (37.8 C)-100.6 F (38.1 C)] 100 F (37.8 C) (07/08 0545) Pulse Rate:  [94-103] 94 (07/08 0545) Resp:  [13-20] 18 (07/08 0545) BP: (100-134)/(51-69) 127/69 (07/08 0545) SpO2:  [95 %-100 %] 97 % (07/08 0545) Weight:  [77.5 kg (170 lb 14.4 oz)] 77.5 kg (170 lb 14.4 oz) (07/07 1627) Last BM Date: 08/03/16  Intake/Output from previous day: 07/07 0701 - 07/08 0700 In: 2210 [P.O.:10; I.V.:1200; IV Piggyback:1000] Out: 550 [Urine:550] Intake/Output this shift: No intake/output data recorded.  General: No distress  Lungs: Clear  Abd: Soft, not tender at all.  Extremities: No clinical signs or symptoms of DVT.  Neuro: Intact  Lab Results:  @LABLAST2 (wbc:2,hgb:2,hct:2,plt:2) BMET ) Recent Labs  08/04/16 0731 08/05/16 0526  NA 138 137  K 4.6 3.7  CL 104 108  CO2 27 23  GLUCOSE 223* 69  BUN 25* 18  CREATININE 1.05* 1.15*  CALCIUM 9.0 8.3*   PT/INR No results for input(s): LABPROT, INR in the last 72 hours. ABG No results for input(s): PHART, HCO3 in the last 72 hours.  Invalid input(s): PCO2, PO2  Studies/Results: US Abdomen Complete  Result Date: 08/04/2016 CLINICAL DATA:  66 year old female with a history of abdominal pain EXAM: ABDOMEN ULTRASOUND COMPLETE COMPARISON:  No prior ultrasound, chest CT 08/25/2014 FINDINGS: Gallbladder: Multiple echogenic foci within the gallbladder lumen compatible with stones/ sludge. Circumferential gallbladder wall thickening of 9 mm -10 mm with internal striations. Equivocal sonographic Murphy's sign, with pain medication administered previous to the scan. Common bile duct: Diameter: 6 mm Liver: Unremarkable appearance of liver parenchyma. Evidence of early biliary ductal dilatation, which was not present on the comparison CT of  08/25/2014 IVC: No abnormality visualized. Pancreas: Visualized portion unremarkable. Spleen: Size and appearance within normal limits. Right Kidney: Length: 10.4 cm. Symmetric echogenicity to the left, and similar to the adjacent liver parenchyma. No hydronephrosis. Anechoic cystic structure with through transmission measures 1 cm x 1 cm x 9 mm. Left Kidney: Length: 11.0 cm. Symmetric echogenicity to the right. No hydronephrosis. Anechoic cystic structure with through transmission on the lateral cortex measures 12 mm x 12 minute by 14 mm. Additional small superior cortical lesion with through transmission measures 7 mm x 5 mm x 7 mm. Abdominal aorta: No aneurysm visualized. Other findings: None. IMPRESSION: Ultrasound is equivocal for acute cholecystitis. Findings which support the diagnosis include circumferential thickened gallbladder wall with internal striations, presence of cholelithiasis, and apparent early intrahepatic biliary ductal dilation. Sonographic Percell Miller sign is negative, however, the patient was administered pain medication. HIDA study may be useful to evaluate for acute cholecystitis, however, this may not characterize the hepatic ductal dilation, for which contrast-enhanced CT or MR would be more appropriate. Bilateral Bosniak 1 renal cysts. These results were called by telephone at the time of interpretation on 08/04/2016 at 12:22 pm to Dr. Dorie Rank , who verbally acknowledged these results. Electronically Signed   By: Corrie Mckusick D.O.   On: 08/04/2016 12:24   Dg Abd Acute W/chest  Result Date: 08/04/2016 CLINICAL DATA:  Nausea, vomiting and abdominal pain. EXAM: DG ABDOMEN ACUTE W/ 1V CHEST COMPARISON:  Chest x-ray on 04/08/2014 FINDINGS: Status post prior CABG. The heart size is normal. There is stable tortuosity of the thoracic aorta. There is no evidence of pulmonary  edema, consolidation, pneumothorax, nodule or pleural fluid. Abdominal films show no evidence of acute bowel obstruction or  significant ileus. No evidence of free intraperitoneal air. No abnormal calcifications identified. Bony structures are unremarkable. IMPRESSION: No acute findings in the chest or abdomen. Electronically Signed   By: Aletta Edouard M.D.   On: 08/04/2016 08:17    Anti-infectives: Anti-infectives    None      Assessment/Plan: s/p  Needs Cardiology clearance prior to Lap chole which will be scheduled for tomorrow.  Lipase still slightly elevated at 123, but patient asymptomatic.   LOS: 1 day   Kathryne Eriksson. Dahlia Bailiff, MD, FACS [phone] [facility] Surgery 08/05/2016

## 2016-08-05 NOTE — Consult Note (Signed)
Cardiology Consultation:   Patient ID: [name]; [id number]; 03-13-1950   Admit date: 08/04/2016 Date of Consult: 08/05/2016  Primary Care Provider: Neva Seat, MD Primary Cardiologist: Stanford Breed   Patient Profile:   [name] is a 66 y.o. female with a hx of ischemic cardiomyopathy who is being seen today for preoperative evaluation at the request of [name].  History of Present Illness:   [name] 66 year old lady with a history of ischemic cardiomyopathy status post 5 vessel CABG. She presented to [facility] on 74 abdominal pain 10 out of 10 in intensity. She was found to have acute pancreatitis and has plans for laparoscopic cholecystectomy. In June 2016, she had a CABG with LIMA to the LAD, vein graft to the first and second diagonal, vein graft to the OM and vein graft to the PDA. She has an ejection fraction of 25-30% based on echo June 2016. She has refused ICD implantation.   She has no chest pain or shortness of breath. She can walk on flat ground without issue. She sleeps flat and has no PND or orthopnea. She has been compliant with her HF medications.  Past Medical History:  Diagnosis Date  . Bilateral pleural effusion 02/22/2014  . Diabetes mellitus 1998   Dx in 1998. Microalbuminuria, never on insulin.  Marland Kitchen GERD 12/05/2005   Qualifier: Diagnosis of  By: Prudencio Burly MD, Phillip Heal    . GERD (gastroesophageal reflux disease)   . History of blurry vision 06/09   Hospitalized for this  . Hyperlipidemia   . Hypertension   . Personal history of colonic adenomas 10/07/2007  . Pneumonia 01/2014    Past Surgical History:  Procedure Laterality Date  . CORONARY ARTERY BYPASS GRAFT N/A 03/01/2014   Procedure: CORONARY ARTERY BYPASS GRAFTING (CABG) x five, using left internal mammary artery and right leg greater saphenous vein harvested endoscopically;  Surgeon: Grace Isaac, MD;  Location: [place];  Service: Open Heart Surgery;  Laterality: N/A;  .  INTRAOPERATIVE TRANSESOPHAGEAL ECHOCARDIOGRAM N/A 03/01/2014   Procedure: INTRAOPERATIVE TRANSESOPHAGEAL ECHOCARDIOGRAM;  Surgeon: Grace Isaac, MD;  Location: [place];  Service: Open Heart Surgery;  Laterality: N/A;  . LEFT AND RIGHT HEART CATHETERIZATION WITH CORONARY ANGIOGRAM N/A 02/26/2014   Procedure: LEFT AND RIGHT HEART CATHETERIZATION WITH CORONARY ANGIOGRAM;  Surgeon: Sinclair Grooms, MD;  Location: [facility] CATH LAB;  Service: Cardiovascular;  Laterality: N/A;  . TOTAL ABDOMINAL HYSTERECTOMY W/ BILATERAL SALPINGOOPHORECTOMY  1996     Inpatient Medications: Scheduled Meds: . enoxaparin (LOVENOX) injection  40 mg Subcutaneous Q24H   Continuous Infusions:  PRN Meds: HYDROmorphone (DILAUDID) injection, morphine injection, pneumococcal 23 valent vaccine, promethazine  Allergies:   No Known Allergies  Social History:   Social History   Social History  . Marital status: Widowed    Spouse name: N/A  . Number of children: N/A  . Years of education: N/A   Occupational History  . Not on file.   Social History Main Topics  . Smoking status: Never Smoker  . Smokeless tobacco: Never Used  . Alcohol use No  . Drug use: No  . Sexual activity: Not on file   Other Topics Concern  . Not on file   Social History Narrative   Married x 42 yrs.  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   Never smoked. Doesn't drink or use drugs.   Does Patient Exercise:  yes - sometimes  To get diabetes supplies for [name] RD, CDE, April 20,2011    Family History:   The patient's family history includes Diabetes in her brother and mother; Heart failure in her brother, mother, and sister; Kidney failure in her mother.  ROS:  Please see the history of present illness.  ROS  All other ROS reviewed and negative.     Physical Exam/Data:   Vitals:   08/04/16 1500 08/04/16 1627 08/04/16 2136 08/05/16 0545  BP: (!) 112/55 132/69  (!) 130/51 127/69  Pulse:  (!) 103 95 94  Resp: 20 16 18 18   Temp:  (!) 100.6 F (38.1 C) (!) 100.5 F (38.1 C) 100 F (37.8 C)  TempSrc:  Oral    SpO2:  100% 97% 97%  Weight:  170 lb 14.4 oz (77.5 kg)    Height:  5\' 2"  (1.575 m)      Intake/Output Summary (Last 24 hours) at 08/05/16 1332 Last data filed at 08/05/16 0643  Gross per 24 hour  Intake             1210 ml  Output              300 ml  Net              910 ml   Filed Weights   08/04/16 0735 08/04/16 1627  Weight: 172 lb (78 kg) 170 lb 14.4 oz (77.5 kg)   Body mass index is 31.26 kg/m.  General:  Well nourished, well developed, in no acute distress HEENT: normal Lymph: no adenopathy Neck: no JVD Endocrine:  No thryomegaly Vascular: No carotid bruits; FA pulses 2+ bilaterally without bruits  Cardiac:  normal S1, S2; RRR; no murmur  Lungs:  clear to auscultation bilaterally, no wheezing, rhonchi or rales  Abd: soft, nontender, no hepatomegaly  Ext: no edema Musculoskeletal:  No deformities, BUE and BLE strength normal and equal Skin: warm and dry  Neuro:  CNs 2-12 intact, no focal abnormalities noted Psych:  Normal affect   EKG:  The EKG was personally reviewed and demonstrates:  Sinus rhythm, 1 degree AV block, prolonged QTc  Relevant CV Studies: TTE 2016 - Left ventricle: The cavity size was normal. Systolic function was   severely reduced. The estimated ejection fraction was in the   range of 25% to 30%. Diffuse hypokinesis. There is akinesis of   the basal-midinferoseptal myocardium. There is akinesis of the   basal-midinferior myocardium. There is akinesis of the   basal-midanteroseptal myocardium. There was an increased relative   contribution of atrial contraction to ventricular filling.   Doppler parameters are consistent with abnormal left ventricular   relaxation (grade 1 diastolic dysfunction). - Aortic valve: Trileaflet; mildly thickened, mildly calcified   leaflets. - Mitral valve: There was  mild regurgitation. - Systemic veins: There is a moderate tp large sized density in the   IVC at the junction of unknown significance. Recommend Chest CT   to rule out thrombus vs. limited repeat echo to assess this area   further.  Laboratory Data:  Chemistry Recent Labs Lab 08/04/16 0731 08/05/16 0526  NA 138 137  K 4.6 3.7  CL 104 108  CO2 27 23  GLUCOSE 223* 69  BUN 25* 18  CREATININE 1.05* 1.15*  CALCIUM 9.0 8.3*  GFRNONAA 54* 48*  GFRAA >60 56*  ANIONGAP 7 6     Recent Labs Lab 08/04/16 0731 08/05/16 0526  PROT 7.1 5.7*  ALBUMIN 3.1* 2.4*  AST  285* 889*  ALT 149* 687*  ALKPHOS 121 164*  BILITOT 1.2 2.2*   Hematology Recent Labs Lab 08/04/16 0731 08/05/16 0526  WBC 7.7 7.6  RBC 3.51* 3.04*  HGB 9.7* 8.4*  HCT 29.8* 26.1*  MCV 84.9 85.9  MCH 27.6 27.6  MCHC 32.6 32.2  RDW 14.1 14.6  PLT 139* 109*   Cardiac EnzymesNo results for input(s): TROPONINI in the last 168 hours. No results for input(s): TROPIPOC in the last 168 hours.  BNPNo results for input(s): BNP, PROBNP in the last 168 hours.  DDimer No results for input(s): DDIMER in the last 168 hours.  Radiology/Studies:  US Abdomen Complete  Result Date: 08/04/2016 CLINICAL DATA:  66 year old female with a history of abdominal pain EXAM: ABDOMEN ULTRASOUND COMPLETE COMPARISON:  No prior ultrasound, chest CT 08/25/2014 FINDINGS: Gallbladder: Multiple echogenic foci within the gallbladder lumen compatible with stones/ sludge. Circumferential gallbladder wall thickening of 9 mm -10 mm with internal striations. Equivocal sonographic Murphy's sign, with pain medication administered previous to the scan. Common bile duct: Diameter: 6 mm Liver: Unremarkable appearance of liver parenchyma. Evidence of early biliary ductal dilatation, which was not present on the comparison CT of 08/25/2014 IVC: No abnormality visualized. Pancreas: Visualized portion unremarkable. Spleen: Size and appearance within normal limits.  Right Kidney: Length: 10.4 cm. Symmetric echogenicity to the left, and similar to the adjacent liver parenchyma. No hydronephrosis. Anechoic cystic structure with through transmission measures 1 cm x 1 cm x 9 mm. Left Kidney: Length: 11.0 cm. Symmetric echogenicity to the right. No hydronephrosis. Anechoic cystic structure with through transmission on the lateral cortex measures 12 mm x 12 minute by 14 mm. Additional small superior cortical lesion with through transmission measures 7 mm x 5 mm x 7 mm. Abdominal aorta: No aneurysm visualized. Other findings: None. IMPRESSION: Ultrasound is equivocal for acute cholecystitis. Findings which support the diagnosis include circumferential thickened gallbladder wall with internal striations, presence of cholelithiasis, and apparent early intrahepatic biliary ductal dilation. Sonographic Percell Miller sign is negative, however, the patient was administered pain medication. HIDA study may be useful to evaluate for acute cholecystitis, however, this may not characterize the hepatic ductal dilation, for which contrast-enhanced CT or MR would be more appropriate. Bilateral Bosniak 1 renal cysts. These results were called by telephone at the time of interpretation on 08/04/2016 at 12:22 pm to Dr. Dorie Rank , who verbally acknowledged these results. Electronically Signed   By: Corrie Mckusick D.O.   On: 08/04/2016 12:24   Dg Abd Acute W/chest  Result Date: 08/04/2016 CLINICAL DATA:  Nausea, vomiting and abdominal pain. EXAM: DG ABDOMEN ACUTE W/ 1V CHEST COMPARISON:  Chest x-ray on 04/08/2014 FINDINGS: Status post prior CABG. The heart size is normal. There is stable tortuosity of the thoracic aorta. There is no evidence of pulmonary edema, consolidation, pneumothorax, nodule or pleural fluid. Abdominal films show no evidence of acute bowel obstruction or significant ileus. No evidence of free intraperitoneal air. No abnormal calcifications identified. Bony structures are unremarkable.  IMPRESSION: No acute findings in the chest or abdomen. Electronically Signed   By: Aletta Edouard M.D.   On: 08/04/2016 08:17    Assessment and Plan:   1. Preoperative cardiac evaluation: Currently, she has no evidence of HF and has no chest pain symptoms. Due to that, she would be an intermediate risk for an intermediate risk procedure. When she is able to take PO medications, would plan to restart her coreg, atorvastatin, aspirin, and lisinopril. 2. Ischemic cardiomyopathy: no  signs of volume overload. Plan to keep I=O for this admission 3. Coronary artery disease status post CABG: no current chest pain, start aspirin when possible post op. 4. Hyperlipidemia: continue atorvastatin.   Signed, Will Meredith Leeds, MD  08/05/2016 1:32 PM

## 2016-08-05 NOTE — Progress Notes (Signed)
Subjective: Patient reports feeling well this morning. Denies abdominal pain, SOB, chest pain, nausea, vomiting. She did have an episode of nausea yesterday night that was resolved with a dose of promethazine.   Objective: Vital signs in last 24 hours: Vitals:   08/04/16 1500 08/04/16 1627 08/04/16 2136 08/05/16 0545  BP: (!) 112/55 132/69 (!) 130/51 127/69  Pulse:  (!) 103 95 94  Resp: 20 16 18 18   Temp:  (!) 100.6 F (38.1 C) (!) 100.5 F (38.1 C) 100 F (37.8 C)  TempSrc:  Oral    SpO2:  100% 97% 97%  Weight:  77.5 kg (170 lb 14.4 oz)    Height:  5' 2"  (1.575 m)     Weight change:   Intake/Output Summary (Last 24 hours) at 08/05/16 1208 Last data filed at 08/05/16 8110  Gross per 24 hour  Intake             1210 ml  Output              300 ml  Net              910 ml   BP 127/69 (BP Location: Right Arm)   Pulse 94   Temp 100 F (37.8 C)   Resp 18   Ht 5' 2"  (1.575 m)   Wt 77.5 kg (170 lb 14.4 oz)   SpO2 97%   BMI 31.26 kg/m   General Appearance:    Alert, cooperative, no distress, appears stated age  Eyes:    PERRL, conjunctiva/corneas clear, no scleral icterus  Throat:   Lips, mucosa, and tongue normal; teeth and gums normal  Lungs:     Clear to auscultation bilaterally, respirations unlabored  Chest Wall:    No tenderness or deformity   Heart:    Regular rate and rhythm, S1 and S2 normal, no murmur, rub   or gallop  Abdomen:     Soft, non-tender, bowel sounds active all four quadrants,    no masses, no organomegaly  Extremities:   Extremities normal, atraumatic, no cyanosis or edema  Pulses:   2+ and symmetric all extremities  Skin:   Skin color, texture, turgor normal, no rashes or lesions  Neurologic:   Grossly intact   Lab Results: Hb/Hct  9.7/29.8 PLT 139 Creatinine 1.05 AST/ALT 285/149 Lipase 1297  Micro Results: No results found for this or any previous visit (from the past 240 hour(s)). Studies/Results: US Abdomen Complete  Result Date:  08/04/2016 CLINICAL DATA:  66 year old female with a history of abdominal pain EXAM: ABDOMEN ULTRASOUND COMPLETE COMPARISON:  No prior ultrasound, chest CT 08/25/2014 FINDINGS: Gallbladder: Multiple echogenic foci within the gallbladder lumen compatible with stones/ sludge. Circumferential gallbladder wall thickening of 9 mm -10 mm with internal striations. Equivocal sonographic Murphy's sign, with pain medication administered previous to the scan. Common bile duct: Diameter: 6 mm Liver: Unremarkable appearance of liver parenchyma. Evidence of early biliary ductal dilatation, which was not present on the comparison CT of 08/25/2014 IVC: No abnormality visualized. Pancreas: Visualized portion unremarkable. Spleen: Size and appearance within normal limits. Right Kidney: Length: 10.4 cm. Symmetric echogenicity to the left, and similar to the adjacent liver parenchyma. No hydronephrosis. Anechoic cystic structure with through transmission measures 1 cm x 1 cm x 9 mm. Left Kidney: Length: 11.0 cm. Symmetric echogenicity to the right. No hydronephrosis. Anechoic cystic structure with through transmission on the lateral cortex measures 12 mm x 12 minute by 14 mm. Additional small superior cortical lesion  with through transmission measures 7 mm x 5 mm x 7 mm. Abdominal aorta: No aneurysm visualized. Other findings: None. IMPRESSION: Ultrasound is equivocal for acute cholecystitis. Findings which support the diagnosis include circumferential thickened gallbladder wall with internal striations, presence of cholelithiasis, and apparent early intrahepatic biliary ductal dilation. Sonographic Percell Miller sign is negative, however, the patient was administered pain medication. HIDA study may be useful to evaluate for acute cholecystitis, however, this may not characterize the hepatic ductal dilation, for which contrast-enhanced CT or MR would be more appropriate. Bilateral Bosniak 1 renal cysts. These results were called by telephone  at the time of interpretation on 08/04/2016 at 12:22 pm to Dr. Dorie Rank , who verbally acknowledged these results. Electronically Signed   By: Corrie Mckusick D.O.   On: 08/04/2016 12:24   Dg Abd Acute W/chest  Result Date: 08/04/2016 CLINICAL DATA:  Nausea, vomiting and abdominal pain. EXAM: DG ABDOMEN ACUTE W/ 1V CHEST COMPARISON:  Chest x-ray on 04/08/2014 FINDINGS: Status post prior CABG. The heart size is normal. There is stable tortuosity of the thoracic aorta. There is no evidence of pulmonary edema, consolidation, pneumothorax, nodule or pleural fluid. Abdominal films show no evidence of acute bowel obstruction or significant ileus. No evidence of free intraperitoneal air. No abnormal calcifications identified. Bony structures are unremarkable. IMPRESSION: No acute findings in the chest or abdomen. Electronically Signed   By: Aletta Edouard M.D.   On: 08/04/2016 08:17   Medications: I have reviewed the patient's current medications. Scheduled Meds: . enoxaparin (LOVENOX) injection  40 mg Subcutaneous Q24H   Continuous Infusions: PRN Meds:.HYDROmorphone (DILAUDID) injection, morphine injection, pneumococcal 23 valent vaccine, promethazine Assessment/Plan: Principal Problem:   Acute pancreatitis Active Problems:   Diabetes mellitus type 2, controlled, without complications (HCC)   Hyperlipidemia   Essential hypertension   Coronary artery disease involving native coronary artery of native heart without angina pectoris   S/P CABG x 5   Cardiomyopathy, ischemic, EF 25-30%  Ms. Wurtz is a 66yo F with pancreatitis and concern for cholelithiasis. Today alk phos is elevated and AST/ALT are significantly elevated since yesterday, reinforcing our suspicion of an obstructive process. Lipase has decreased from 1300 to 123 overnight. Patient appears well and is asymptomatic.  Acute pancreatitis - Surgery has scheduled lap chole for tomorrow 7/9 - Bowel rest, NPO - Promethazine for nausea  DM  Type 2, controlled, wo complications DM is controlled with diet. A1C was 6.2 two months ago. No medications indicated at this time.  Hyperlipidemia Well managed. Home statin not indicated at this time.  CAD s/p CABG - No active chest pain or SOB. Need to clear from cardiology standpoint prior to lap chole tomorrow.  Dispo: Expected length of stay greater than 2 midnights.  This is a Careers information officer Note.  The care of the patient was discussed with Dr. Eppie Gibson and the assessment and plan formulated with their assistance.  Please see their attached note for official documentation of the daily encounter.   LOS: 1 day   Murlean Caller, Medical Student  08/05/2016, 12:08 PM  Pager: [phone]

## 2016-08-05 NOTE — Plan of Care (Signed)
Problem: Safety: Goal: Ability to remain free from injury will improve Outcome: Progressing Pt will be free from falls and injuries during this hospitalization.  Problem: Education: Goal: Knowledge of Pancreatitis treatment and prevention will improve Outcome: Progressing Pt will be free from abdominal pain prior to discharge.

## 2016-08-05 NOTE — Progress Notes (Signed)
Orders reviewed

## 2016-08-05 NOTE — H&P (Signed)
Internal Medicine Attending Admission Note Date: 08/05/2016  Patient name: [name] Medical record number: [id number] Date of birth: 05-Oct-1950 Age: 66 y.o. Gender: female  I saw and evaluated the patient. I reviewed the resident's note and I agree with the resident's findings and plan as documented in the resident's note.  Chief Complaint(s): Nausea, vomiting, and abdominal pain 3 days.  History - key components related to admission:  [name] is a 66 year old woman with a history of diabetes, coronary artery disease status post coronary artery bypass grafting 5, obesity, hypertension, and hyperlipidemia who presents with a three-day history of nausea, vomiting, and abdominal pain. She was in her usual state of health on July 4 when she went to a cookout. The following morning she developed severe abdominal pain with nausea and vomiting. These symptoms persisted and she therefore presented to the emergency department for further evaluation. The pain was nonlocalized, nonradiating, continuous, and worsened by retching. She denied any recent antibiotics, as well as any diarrhea or hematemesis. She also denied fevers, shakes, and chills. She has never had similar pain. Evaluation in the emergency department revealed cholelithiasis and pancreatitis. She was therefore admitted to the internal medicine teaching service for further evaluation and care. She was also seen in the emergency department by general surgery who is planning a laparoscopic cholecystectomy prior to discharge.  When seen on rounds the morning following admission she denied any abdominal pain, nausea, or vomiting. She felt quite well overall.  Physical Exam - key components related to admission:  Vitals:   08/04/16 1500 08/04/16 1627 08/04/16 2136 08/05/16 0545  BP: (!) 112/55 132/69 (!) 130/51 127/69  Pulse:  (!) 103 95 94  Resp: 20 16 18 18   Temp:  (!) 100.6 F (38.1 C) (!) 100.5 F (38.1 C) 100 F (37.8 C)   TempSrc:  Oral    SpO2:  100% 97% 97%  Weight:  170 lb 14.4 oz (77.5 kg)    Height:  5\' 2"  (1.575 m)     Gen.: Well-developed, well-nourished, woman lying comfortably in bed in no acute distress. Abdomen: Soft, nontender, without rebound.  Lab results:  Basic Metabolic Panel:  Recent Labs  08/04/16 0731 08/05/16 0526  NA 138 137  K 4.6 3.7  CL 104 108  CO2 27 23  GLUCOSE 223* 69  BUN 25* 18  CREATININE 1.05* 1.15*  CALCIUM 9.0 8.3*   Liver Function Tests:  Recent Labs  08/04/16 0731 08/05/16 0526  AST 285* 889*  ALT 149* 687*  ALKPHOS 121 164*  BILITOT 1.2 2.2*  PROT 7.1 5.7*  ALBUMIN 3.1* 2.4*    Recent Labs  08/04/16 0731 08/05/16 0526  LIPASE 1,297* 123*   CBC:  Recent Labs  08/04/16 0731 08/05/16 0526  WBC 7.7 7.6  NEUTROABS 6.1  --   HGB 9.7* 8.4*  HCT 29.8* 26.1*  MCV 84.9 85.9  PLT 139* 109*   CBG:  Recent Labs  08/04/16 0826  GLUCAP 151*   Imaging results:   Ultrasound: Not personally reviewed although I did review the radiologist's report.  Abdominal x-ray: Personally reviewed. Nonspecific bowel gas pattern, no free air.  PA chest x-ray: Personally reviewed. No effusions, infiltrates, or masses.  Other results:  EKG: Personally reviewed. Normal sinus rhythm at 88 bpm, normal axis, prolonged QTC at 511 ms, no significant Q waves, no LVH by voltage, good R wave progression, no ST segment changes, lateral T wave flattening. There are no significant changes from the previous ECG  on 05/24/2016.  Assessment & Plan by Problem:  [name] is a 66 year old woman with a history of diabetes, coronary artery disease status post coronary artery bypass grafting 5, obesity, hypertension, and hyperlipidemia who presents with a three-day history of nausea, vomiting, and abdominal pain. She was found to have cholelithiasis and acute pancreatitis. Therefore she likely has gallstone pancreatitis. Her liver function tests are consistent with  the diagnosis. Fortunately, she is currently asymptomatic.  1) Gallstone pancreatitis: She is currently asymptomatic. She is scheduled for a laparoscopic cholecystectomy tomorrow. We will continue current supportive care perioperatively.  2) Disposition: Pending her recovery from the laproscopic cholecystectomy.

## 2016-08-06 ENCOUNTER — Encounter (HOSPITAL_COMMUNITY): Payer: Self-pay | Admitting: Certified Registered Nurse Anesthetist

## 2016-08-06 ENCOUNTER — Inpatient Hospital Stay (HOSPITAL_COMMUNITY): Payer: Medicare Other

## 2016-08-06 ENCOUNTER — Inpatient Hospital Stay (HOSPITAL_COMMUNITY): Payer: Medicare Other | Admitting: Certified Registered Nurse Anesthetist

## 2016-08-06 ENCOUNTER — Encounter (HOSPITAL_COMMUNITY)
Admission: EM | Disposition: A | Discharge status: Home / Self Care | Payer: Self-pay | Source: Home / Self Care | Attending: Internal Medicine

## 2016-08-06 HISTORY — PX: CHOLECYSTECTOMY: SHX55

## 2016-08-06 LAB — COMPREHENSIVE METABOLIC PANEL
ALK PHOS: 170 U/L — AB (ref 38–126)
ALT: 469 U/L — AB (ref 14–54)
AST: 462 U/L — AB (ref 15–41)
Albumin: 2.4 g/dL — ABNORMAL LOW (ref 3.5–5.0)
Anion gap: 4 — ABNORMAL LOW (ref 5–15)
BILIRUBIN TOTAL: 1.6 mg/dL — AB (ref 0.3–1.2)
BUN: 12 mg/dL (ref 6–20)
CALCIUM: 8.7 mg/dL — AB (ref 8.9–10.3)
CHLORIDE: 109 mmol/L (ref 101–111)
CO2: 27 mmol/L (ref 22–32)
CREATININE: 1.12 mg/dL — AB (ref 0.44–1.00)
GFR, EST AFRICAN AMERICAN: 58 mL/min — AB (ref >60)
GFR, EST NON AFRICAN AMERICAN: 50 mL/min — AB (ref >60)
Glucose, Bld: 89 mg/dL (ref 65–99)
Potassium: 4.2 mmol/L (ref 3.5–5.1)
Sodium: 140 mmol/L (ref 135–145)
TOTAL PROTEIN: 6.2 g/dL — AB (ref 6.5–8.1)

## 2016-08-06 LAB — CBC WITH DIFFERENTIAL/PLATELET
BASOS ABS: 0 10*3/uL (ref 0.0–0.1)
Basophils Relative: 0 %
EOS PCT: 3 %
Eosinophils Absolute: 0.2 10*3/uL (ref 0.0–0.7)
HEMATOCRIT: 27.6 % — AB (ref 36.0–46.0)
HEMOGLOBIN: 8.8 g/dL — AB (ref 12.0–15.0)
LYMPHS ABS: 1.8 10*3/uL (ref 0.7–4.0)
LYMPHS PCT: 30 %
MCH: 27.8 pg (ref 26.0–34.0)
MCHC: 31.9 g/dL (ref 30.0–36.0)
MCV: 87.1 fL (ref 78.0–100.0)
Monocytes Absolute: 0.7 10*3/uL (ref 0.1–1.0)
Monocytes Relative: 11 %
NEUTROS ABS: 3.4 10*3/uL (ref 1.7–7.7)
Neutrophils Relative %: 56 %
PLATELETS: 114 10*3/uL — AB (ref 150–400)
RBC: 3.17 MIL/uL — AB (ref 3.87–5.11)
RDW: 14.8 % (ref 11.5–15.5)
WBC: 6 10*3/uL (ref 4.0–10.5)

## 2016-08-06 LAB — GLUCOSE, CAPILLARY
Glucose-Capillary: 70 mg/dL (ref 65–99)
Glucose-Capillary: 106 mg/dL — ABNORMAL HIGH (ref 65–99)

## 2016-08-06 LAB — SURGICAL PCR SCREEN
MRSA, PCR: NEGATIVE
Staphylococcus aureus: NEGATIVE

## 2016-08-06 LAB — LIPASE, BLOOD: LIPASE: 28 U/L (ref 11–51)

## 2016-08-06 SURGERY — LAPAROSCOPIC CHOLECYSTECTOMY WITH INTRAOPERATIVE CHOLANGIOGRAM
Anesthesia: General | Site: Abdomen

## 2016-08-06 MED ORDER — HYDROMORPHONE HCL 1 MG/ML IJ SOLN
0.2500 mg | INTRAMUSCULAR | Status: DC | PRN
  Administered 2016-08-06: 0.5 mg via INTRAVENOUS

## 2016-08-06 MED ORDER — CEFAZOLIN SODIUM-DEXTROSE 2-4 GM/100ML-% IV SOLN
2.0000 g | INTRAVENOUS | Status: AC
  Administered 2016-08-06: 2 g via INTRAVENOUS

## 2016-08-06 MED ORDER — MIDAZOLAM HCL 5 MG/5ML IJ SOLN
INTRAMUSCULAR | Status: DC | PRN
  Administered 2016-08-06: 1 mg via INTRAVENOUS

## 2016-08-06 MED ORDER — ONDANSETRON HCL 4 MG/2ML IJ SOLN
INTRAMUSCULAR | Status: AC
  Filled 2016-08-06: qty 2

## 2016-08-06 MED ORDER — PROPOFOL 10 MG/ML IV BOLUS
INTRAVENOUS | Status: DC | PRN
  Administered 2016-08-06: 30 mg via INTRAVENOUS
  Administered 2016-08-06: 100 mg via INTRAVENOUS

## 2016-08-06 MED ORDER — PROPOFOL 10 MG/ML IV BOLUS
INTRAVENOUS | Status: AC
  Filled 2016-08-06: qty 20

## 2016-08-06 MED ORDER — HEMOSTATIC AGENTS (NO CHARGE) OPTIME
TOPICAL | Status: DC | PRN
  Administered 2016-08-06: 1 via TOPICAL

## 2016-08-06 MED ORDER — LISINOPRIL 40 MG PO TABS
40.0000 mg | ORAL_TABLET | Freq: Every day | ORAL | Status: DC
  Administered 2016-08-06 – 2016-08-07 (×2): 40 mg via ORAL
  Filled 2016-08-06 (×2): qty 1

## 2016-08-06 MED ORDER — SUGAMMADEX SODIUM 200 MG/2ML IV SOLN
INTRAVENOUS | Status: DC | PRN
  Administered 2016-08-06: 150 mg via INTRAVENOUS

## 2016-08-06 MED ORDER — MEPERIDINE HCL 25 MG/ML IJ SOLN: 6.2500 mg | INTRAMUSCULAR | Status: DC | PRN

## 2016-08-06 MED ORDER — MIDAZOLAM HCL 2 MG/2ML IJ SOLN
INTRAMUSCULAR | Status: AC
  Filled 2016-08-06: qty 2

## 2016-08-06 MED ORDER — STERILE WATER FOR IRRIGATION IR SOLN
Status: DC | PRN
  Administered 2016-08-06: 1000 mL

## 2016-08-06 MED ORDER — PHENYLEPHRINE 40 MCG/ML (10ML) SYRINGE FOR IV PUSH (FOR BLOOD PRESSURE SUPPORT)
PREFILLED_SYRINGE | INTRAVENOUS | Status: AC
  Filled 2016-08-06: qty 20

## 2016-08-06 MED ORDER — SODIUM CHLORIDE 0.9 % IR SOLN
Status: DC | PRN
  Administered 2016-08-06 (×2): 1000 mL

## 2016-08-06 MED ORDER — MORPHINE SULFATE (PF) 2 MG/ML IV SOLN
2.0000 mg | INTRAVENOUS | Status: DC | PRN
  Administered 2016-08-06 (×3): 2 mg via INTRAVENOUS
  Filled 2016-08-06 (×3): qty 1

## 2016-08-06 MED ORDER — ONDANSETRON HCL 4 MG/2ML IJ SOLN
INTRAMUSCULAR | Status: DC | PRN
  Administered 2016-08-06: 4 mg via INTRAVENOUS

## 2016-08-06 MED ORDER — 0.9 % SODIUM CHLORIDE (POUR BTL) OPTIME
TOPICAL | Status: DC | PRN
  Administered 2016-08-06: 1000 mL

## 2016-08-06 MED ORDER — CEFAZOLIN SODIUM-DEXTROSE 2-4 GM/100ML-% IV SOLN
INTRAVENOUS | Status: AC
  Filled 2016-08-06: qty 100

## 2016-08-06 MED ORDER — IOPAMIDOL (ISOVUE-300) INJECTION 61%
INTRAVENOUS | Status: AC
  Filled 2016-08-06: qty 50

## 2016-08-06 MED ORDER — BUPIVACAINE-EPINEPHRINE 0.25% -1:200000 IJ SOLN
INTRAMUSCULAR | Status: DC | PRN
  Administered 2016-08-06: 16 mL

## 2016-08-06 MED ORDER — IOPAMIDOL (ISOVUE-300) INJECTION 61%
INTRAVENOUS | Status: DC | PRN
  Administered 2016-08-06: 6 mL

## 2016-08-06 MED ORDER — HYDROMORPHONE HCL 1 MG/ML IJ SOLN
INTRAMUSCULAR | Status: AC
  Filled 2016-08-06: qty 0.5

## 2016-08-06 MED ORDER — SUGAMMADEX SODIUM 200 MG/2ML IV SOLN
INTRAVENOUS | Status: AC
  Filled 2016-08-06: qty 2

## 2016-08-06 MED ORDER — LIDOCAINE HCL (CARDIAC) 20 MG/ML IV SOLN
INTRAVENOUS | Status: DC | PRN
  Administered 2016-08-06: 40 mg via INTRAVENOUS

## 2016-08-06 MED ORDER — DEXAMETHASONE SODIUM PHOSPHATE 10 MG/ML IJ SOLN
INTRAMUSCULAR | Status: DC | PRN
  Administered 2016-08-06: 10 mg via INTRAVENOUS

## 2016-08-06 MED ORDER — FENTANYL CITRATE (PF) 250 MCG/5ML IJ SOLN
INTRAMUSCULAR | Status: AC
  Filled 2016-08-06: qty 5

## 2016-08-06 MED ORDER — FENTANYL CITRATE (PF) 100 MCG/2ML IJ SOLN
INTRAMUSCULAR | Status: DC | PRN
  Administered 2016-08-06: 100 ug via INTRAVENOUS

## 2016-08-06 MED ORDER — PHENYLEPHRINE 40 MCG/ML (10ML) SYRINGE FOR IV PUSH (FOR BLOOD PRESSURE SUPPORT)
PREFILLED_SYRINGE | INTRAVENOUS | Status: DC | PRN
  Administered 2016-08-06: 80 ug via INTRAVENOUS

## 2016-08-06 MED ORDER — OXYCODONE HCL 5 MG PO TABS
5.0000 mg | ORAL_TABLET | ORAL | Status: DC | PRN
  Administered 2016-08-07 (×2): 10 mg via ORAL
  Filled 2016-08-06 (×2): qty 2

## 2016-08-06 MED ORDER — ONDANSETRON HCL 4 MG/2ML IJ SOLN
4.0000 mg | Freq: Once | INTRAMUSCULAR | Status: AC | PRN
  Administered 2016-08-06: 4 mg via INTRAVENOUS

## 2016-08-06 MED ORDER — ROCURONIUM BROMIDE 100 MG/10ML IV SOLN
INTRAVENOUS | Status: DC | PRN
  Administered 2016-08-06: 50 mg via INTRAVENOUS

## 2016-08-06 MED ORDER — LACTATED RINGERS IV SOLN
INTRAVENOUS | Status: DC
  Administered 2016-08-06: 13:00:00 via INTRAVENOUS

## 2016-08-06 MED ORDER — BUPIVACAINE-EPINEPHRINE (PF) 0.25% -1:200000 IJ SOLN
INTRAMUSCULAR | Status: AC
  Filled 2016-08-06: qty 30

## 2016-08-06 SURGICAL SUPPLY — 45 items
ADH SKN CLS APL DERMABOND .7 (GAUZE/BANDAGES/DRESSINGS) ×1
APPLIER CLIP 5 13 M/L LIGAMAX5 (MISCELLANEOUS) ×2
APR CLP MED LRG 5 ANG JAW (MISCELLANEOUS) ×1
BAG SPEC RTRVL 10 TROC 200 (ENDOMECHANICALS) ×1
CANISTER SUCT 3000ML PPV (MISCELLANEOUS) ×2 IMPLANT
CHLORAPREP W/TINT 26ML (MISCELLANEOUS) ×2 IMPLANT
CLIP APPLIE 5 13 M/L LIGAMAX5 (MISCELLANEOUS) ×1 IMPLANT
COVER MAYO STAND STRL (DRAPES) ×2 IMPLANT
COVER SURGICAL LIGHT HANDLE (MISCELLANEOUS) ×2 IMPLANT
DERMABOND ADVANCED (GAUZE/BANDAGES/DRESSINGS) ×1
DERMABOND ADVANCED .7 DNX12 (GAUZE/BANDAGES/DRESSINGS) ×1 IMPLANT
DRAPE C-ARM 42X72 X-RAY (DRAPES) ×2 IMPLANT
ELECT REM PT RETURN 9FT ADLT (ELECTROSURGICAL) ×2
ELECTRODE REM PT RTRN 9FT ADLT (ELECTROSURGICAL) ×1 IMPLANT
FILTER SMOKE EVAC LAPAROSHD (FILTER) ×1 IMPLANT
GLOVE BIO SURGEON STRL SZ8 (GLOVE) ×2 IMPLANT
GLOVE BIOGEL PI IND STRL 8 (GLOVE) ×1 IMPLANT
GLOVE BIOGEL PI INDICATOR 8 (GLOVE) ×1
GOWN STRL REUS W/ TWL LRG LVL3 (GOWN DISPOSABLE) ×2 IMPLANT
GOWN STRL REUS W/ TWL XL LVL3 (GOWN DISPOSABLE) ×1 IMPLANT
GOWN STRL REUS W/TWL LRG LVL3 (GOWN DISPOSABLE) ×4
GOWN STRL REUS W/TWL XL LVL3 (GOWN DISPOSABLE) ×2
HEMOSTAT SNOW SURGICEL 2X4 (HEMOSTASIS) ×1 IMPLANT
KIT BASIN OR (CUSTOM PROCEDURE TRAY) ×2 IMPLANT
KIT ROOM TURNOVER OR (KITS) ×2 IMPLANT
L-HOOK LAP DISP 36CM (ELECTROSURGICAL) ×2
LHOOK LAP DISP 36CM (ELECTROSURGICAL) ×1 IMPLANT
NEEDLE 22X1 1/2 (OR ONLY) (NEEDLE) ×2 IMPLANT
NS IRRIG 1000ML POUR BTL (IV SOLUTION) ×2 IMPLANT
PAD ARMBOARD 7.5X6 YLW CONV (MISCELLANEOUS) ×2 IMPLANT
PENCIL BUTTON HOLSTER BLD 10FT (ELECTRODE) ×2 IMPLANT
POUCH RETRIEVAL ECOSAC 10 (ENDOMECHANICALS) ×1 IMPLANT
POUCH RETRIEVAL ECOSAC 10MM (ENDOMECHANICALS) ×1
SCISSORS LAP 5X35 DISP (ENDOMECHANICALS) ×2 IMPLANT
SET CHOLANGIOGRAPH 5 50 .035 (SET/KITS/TRAYS/PACK) ×2 IMPLANT
SET IRRIG TUBING LAPAROSCOPIC (IRRIGATION / IRRIGATOR) ×2 IMPLANT
SLEEVE ENDOPATH XCEL 5M (ENDOMECHANICALS) ×4 IMPLANT
SPECIMEN JAR SMALL (MISCELLANEOUS) ×2 IMPLANT
SUT VIC AB 4-0 PS2 27 (SUTURE) ×2 IMPLANT
TOWEL OR 17X24 6PK STRL BLUE (TOWEL DISPOSABLE) ×2 IMPLANT
TOWEL OR 17X26 10 PK STRL BLUE (TOWEL DISPOSABLE) ×2 IMPLANT
TRAY LAPAROSCOPIC MC (CUSTOM PROCEDURE TRAY) ×2 IMPLANT
TROCAR XCEL BLUNT TIP 100MML (ENDOMECHANICALS) ×2 IMPLANT
TROCAR XCEL NON-BLD 5MMX100MML (ENDOMECHANICALS) ×2 IMPLANT
TUBING INSUFFLATION (TUBING) ×2 IMPLANT

## 2016-08-06 NOTE — Progress Notes (Signed)
  Date: 08/06/2016  Patient name: [name]  Medical record number: [id number]  Date of birth: [birth year]   I have seen and evaluated this patient and I have discussed the plan of care with the house staff. Please see their note for complete details. I concur with their findings.  Bartholomew Crews, MD 08/06/2016, 3:53 PM

## 2016-08-06 NOTE — Progress Notes (Addendum)
Patient going to OR for Lap Chole. Alert and oriented x 4; no complaints of pain or other discomfort. Consent was signed; daughter at bedside. Short stay was called and report was given to the nurse.

## 2016-08-06 NOTE — Anesthesia Preprocedure Evaluation (Signed)
Anesthesia Evaluation  Patient identified by MRN, date of birth, ID band Patient awake    Reviewed: Allergy & Precautions, NPO status , Patient's Chart, lab work & pertinent test results  Airway Mallampati: I  TM Distance: >3 FB Neck ROM: Full    Dental   Pulmonary    Pulmonary exam normal        Cardiovascular hypertension, Pt. on medications + CAD and + CABG  Normal cardiovascular exam     Neuro/Psych    GI/Hepatic GERD  Medicated and Controlled,  Endo/Other  diabetes, Type 2  Renal/GU      Musculoskeletal   Abdominal   Peds  Hematology   Anesthesia Other Findings   Reproductive/Obstetrics                             Anesthesia Physical Anesthesia Plan  ASA: III  Anesthesia Plan: General   Post-op Pain Management:    Induction: Intravenous  PONV Risk Score and Plan: 3 and Ondansetron, Dexamethasone, Propofol and Midazolam  Airway Management Planned: Oral ETT  Additional Equipment:   Intra-op Plan:   Post-operative Plan: Extubation in OR  Informed Consent: I have reviewed the patients History and Physical, chart, labs and discussed the procedure including the risks, benefits and alternatives for the proposed anesthesia with the patient or authorized representative who has indicated his/her understanding and acceptance.     Plan Discussed with: CRNA and Surgeon  Anesthesia Plan Comments:         Anesthesia Quick Evaluation

## 2016-08-06 NOTE — Anesthesia Procedure Notes (Addendum)
Procedure Name: Intubation Date/Time: 08/06/2016 1:30 PM Performed by: Willeen Cass P Pre-anesthesia Checklist: Patient identified, Emergency Drugs available, Suction available and Patient being monitored Patient Re-evaluated:Patient Re-evaluated prior to inductionOxygen Delivery Method: Circle System Utilized Preoxygenation: Pre-oxygenation with 100% oxygen Intubation Type: IV induction Ventilation: Mask ventilation without difficulty and Oral airway inserted - appropriate to patient size Laryngoscope Size: Mac and 3 Grade View: Grade I Tube type: Oral Tube size: 7.0 mm Number of attempts: 1 Airway Equipment and Method: Stylet and Oral airway Placement Confirmation: ETT inserted through vocal cords under direct vision,  positive ETCO2 and breath sounds checked- equal and bilateral Secured at: 21 cm Tube secured with: Tape Dental Injury: Teeth and Oropharynx as per pre-operative assessment  Comments: Airway performed by Noe Gens. IV induction by Dr Linna Caprice.

## 2016-08-06 NOTE — Progress Notes (Signed)
Patient back from OR. Alert and oriented; denied pain at this time. BP elevated - MD notified. Patient has 4 Ports Abdomen - liquid skin adhesive. Will continue to monitor.

## 2016-08-06 NOTE — Discharge Instructions (Signed)

## 2016-08-06 NOTE — Progress Notes (Signed)
Subjective: [name] was seen in her room with her family at her bedside. She reports feeling "fine" this morning. She denies any pain, discomfort, SOB, chest pain, nausea, vomiting.   Objective: Vital signs in last 24 hours: Vitals:   08/05/16 0545 08/05/16 1631 08/05/16 2235 08/06/16 0535  BP: 127/69 (!) 145/68 (!) 165/74 (!) 141/72  Pulse: 94 88 92 86  Resp: 18 15 18 18   Temp: 100 F (37.8 C) 100.2 F (37.9 C) (!) 100.5 F (38.1 C) 98.9 F (37.2 C)  TempSrc:    Oral  SpO2: 97% 98% 96% 98%  Weight:      Height:        Intake/Output Summary (Last 24 hours) at 08/06/16 0816 Last data filed at 08/05/16 1500  Gross per 24 hour  Intake              150 ml  Output                0 ml  Net              150 ml   BP (!) 141/72 (BP Location: Right Arm)   Pulse 86   Temp 98.9 F (37.2 C) (Oral)   Resp 18   Ht 5' 2"  (1.575 m)   Wt 77.5 kg (170 lb 14.4 oz)   SpO2 98%   BMI 31.26 kg/m  General appearance: alert, cooperative and appears stated age Eyes: negative findings: conjunctivae and sclerae normal and pupils equal, round, reactive to light and accomodation Lungs: clear to auscultation bilaterally Heart: regular rate and rhythm, S1, S2 normal, no murmur, click, rub or gallop Abdomen: soft, non-tender; bowel sounds normal; no masses,  no organomegaly Extremities: extremities normal, atraumatic, no cyanosis or edema Skin: Skin color, texture, turgor normal. No rashes or lesions Lab Results: Creatinine 1.12, stable Alk phos 170, stable Lipase 28 normalized AST/ALT 462/269, down from 889/687 yesterday Bilirubin 1.6, down from 2.2 yesterday PLT 114, stable Micro Results: Recent Results (from the past 240 hour(s))  Surgical pcr screen     Status: None   Collection Time: 08/06/16  2:14 AM  Result Value Ref Range Status   MRSA, PCR NEGATIVE NEGATIVE Final   Staphylococcus aureus NEGATIVE NEGATIVE Final    Comment:        The Xpert SA Assay (FDA approved for NASAL  specimens in patients over 57 years of age), is one component of a comprehensive surveillance program.  Test performance has been validated by [facility] for patients greater than or equal to 58 year old. It is not intended to diagnose infection nor to guide or monitor treatment.    Studies/Results: US Abdomen Complete  Result Date: 08/04/2016 CLINICAL DATA:  66 year old female with a history of abdominal pain EXAM: ABDOMEN ULTRASOUND COMPLETE COMPARISON:  No prior ultrasound, chest CT 08/25/2014 FINDINGS: Gallbladder: Multiple echogenic foci within the gallbladder lumen compatible with stones/ sludge. Circumferential gallbladder wall thickening of 9 mm -10 mm with internal striations. Equivocal sonographic Murphy's sign, with pain medication administered previous to the scan. Common bile duct: Diameter: 6 mm Liver: Unremarkable appearance of liver parenchyma. Evidence of early biliary ductal dilatation, which was not present on the comparison CT of 08/25/2014 IVC: No abnormality visualized. Pancreas: Visualized portion unremarkable. Spleen: Size and appearance within normal limits. Right Kidney: Length: 10.4 cm. Symmetric echogenicity to the left, and similar to the adjacent liver parenchyma. No hydronephrosis. Anechoic cystic structure with through transmission measures 1 cm x 1 cm x  9 mm. Left Kidney: Length: 11.0 cm. Symmetric echogenicity to the right. No hydronephrosis. Anechoic cystic structure with through transmission on the lateral cortex measures 12 mm x 12 minute by 14 mm. Additional small superior cortical lesion with through transmission measures 7 mm x 5 mm x 7 mm. Abdominal aorta: No aneurysm visualized. Other findings: None. IMPRESSION: Ultrasound is equivocal for acute cholecystitis. Findings which support the diagnosis include circumferential thickened gallbladder wall with internal striations, presence of cholelithiasis, and apparent early intrahepatic biliary ductal dilation.  Sonographic Percell Miller sign is negative, however, the patient was administered pain medication. HIDA study may be useful to evaluate for acute cholecystitis, however, this may not characterize the hepatic ductal dilation, for which contrast-enhanced CT or MR would be more appropriate. Bilateral Bosniak 1 renal cysts. These results were called by telephone at the time of interpretation on 08/04/2016 at 12:22 pm to Dr. Dorie Rank , who verbally acknowledged these results. Electronically Signed   By: Corrie Mckusick D.O.   On: 08/04/2016 12:24   Medications: I have reviewed the patient's current medications. Scheduled Meds: Continuous Infusions: PRN Meds:.morphine injection, pneumococcal 23 valent vaccine, promethazine Assessment/Plan: Principal Problem:   Acute pancreatitis Active Problems:   Diabetes mellitus type 2, controlled, without complications ([facility])   Hyperlipidemia   Essential hypertension   Coronary artery disease involving native coronary artery of native heart without angina pectoris   S/P CABG x 5   Cardiomyopathy, ischemic, EF 25-30%   Acute gallstone pancreatitis   Chronic systolic heart failure [facility])  [name] is a 66yo F with pancreatitis and concern for cholelithiasis. Her creatinine and alk phos are stable. Her lipase has returned to normal. AST/ALT has decreased to 462/269 from 889/687 yesterday. Her bilirubin is also downtrending. She is asymptomatic.  Acute pancreatitis - Surgery has scheduled lap chole for today 7/9 @ 1pm - Bowel rest, NPO - Promethazine for nausea  DM Type 2, controlled, wo complications DM is controlled with diet. A1C was 6.2 two months ago. No medications indicated at this time.  Hyperlipidemia Well managed. Home statin not indicated at this time.  CAD s/p CABG No active chest pain or SOB. Cleared from cardiology standpoint for surgery today.  Dispo: Expected length of stay 1-2 day(s).  This is a Careers information officer Note.  The care of the  patient was discussed with Dr. Lynnae January and the assessment and plan formulated with their assistance.  Please see their attached note for official documentation of the daily encounter.   LOS: 2 days   Murlean Caller, Medical Student 08/06/2016, 8:16 AM

## 2016-08-06 NOTE — Transfer of Care (Signed)
Immediate Anesthesia Transfer of Care Note  Patient: [name]  Procedure(s) Performed: Procedure(s): LAPAROSCOPIC CHOLECYSTECTOMY WITH INTRAOPERATIVE CHOLANGIOGRAM (N/A)  Patient Location: PACU  Anesthesia Type:General  Level of Consciousness: awake, oriented and patient cooperative  Airway & Oxygen Therapy: Patient Spontanous Breathing and Patient connected to face mask oxygen  Post-op Assessment: Report given to RN and Post -op Vital signs reviewed and stable  Post vital signs: Reviewed  Last Vitals:  Vitals:   08/06/16 1220 08/06/16 1441  BP: (!) 158/70   Pulse: 78   Resp: 18   Temp: 37.1 C (!) (P) 36.1 C    Last Pain:  Vitals:   08/06/16 1220  TempSrc: Oral  PainSc:       Patients Stated Pain Goal: 2 (57/89/78 4784)  Complications: No apparent anesthesia complications

## 2016-08-06 NOTE — Progress Notes (Signed)
[facility] Progress Note     Subjective: CC: epigastric abdominal pain Patient reports some epigastric pain this morning around 0300 that improved with IV Morphine. She endorses intermittent, mild, upper abdominal pain significantly improved compared to when she came in. Nausea and vomiting resolved. Tolerated PO. NPO after midnight. Denies questions about surgery.  Objective: Vital signs in last 24 hours: Temp:  [98.9 F (37.2 C)-100.5 F (38.1 C)] 98.9 F (37.2 C) (07/09 0535) Pulse Rate:  [86-92] 86 (07/09 0535) Resp:  [15-18] 18 (07/09 0535) BP: (141-165)/(68-74) 141/72 (07/09 0535) SpO2:  [96 %-98 %] 98 % (07/09 0535) Last BM Date: 08/05/16  Intake/Output from previous day: 07/08 0701 - 07/09 0700 In: 150 [P.O.:150] Out: -  Intake/Output this shift: No intake/output data recorded.  PE: Gen:  Alert, NAD, pleasant Card:  Regular rate and rhythm, pedal pulses 2+ BL Pulm:  Normal effort, clear to auscultation bilaterally Abd: Soft, non-tender, mild distention, bowel sounds present Skin: warm and dry, no rashes  Psych: A&Ox3   Lab Results:   Recent Labs  08/05/16 0526 08/06/16 0454  WBC 7.6 6.0  HGB 8.4* 8.8*  HCT 26.1* 27.6*  PLT 109* 114*   BMET  Recent Labs  08/05/16 0526 08/06/16 0454  NA 137 140  K 3.7 4.2  CL 108 109  CO2 23 27  GLUCOSE 69 89  BUN 18 12  CREATININE 1.15* 1.12*  CALCIUM 8.3* 8.7*   PT/INR No results for input(s): LABPROT, INR in the last 72 hours. CMP     Component Value Date/Time   NA 140 08/06/2016 0454   NA 145 (H) 09/01/2015 1631   K 4.2 08/06/2016 0454   CL 109 08/06/2016 0454   CO2 27 08/06/2016 0454   GLUCOSE 89 08/06/2016 0454   BUN 12 08/06/2016 0454   BUN 16 09/01/2015 1631   CREATININE 1.12 (H) 08/06/2016 0454   CREATININE 1.14 (H) 06/26/2016 1122   CALCIUM 8.7 (L) 08/06/2016 0454   PROT 6.2 (L) 08/06/2016 0454   ALBUMIN 2.4 (L) 08/06/2016 0454   AST 462 (H) 08/06/2016 0454   ALT 469 (H)  08/06/2016 0454   ALKPHOS 170 (H) 08/06/2016 0454   BILITOT 1.6 (H) 08/06/2016 0454   GFRNONAA 50 (L) 08/06/2016 0454   GFRNONAA 35 (L) 08/18/2015 1135   GFRAA 58 (L) 08/06/2016 0454   GFRAA 41 (L) 08/18/2015 1135   Lipase     Component Value Date/Time   LIPASE 28 08/06/2016 0454       Studies/Results: US Abdomen Complete  Result Date: 08/04/2016 CLINICAL DATA:  66 year old female with a history of abdominal pain EXAM: ABDOMEN ULTRASOUND COMPLETE COMPARISON:  No prior ultrasound, chest CT 08/25/2014 FINDINGS: Gallbladder: Multiple echogenic foci within the gallbladder lumen compatible with stones/ sludge. Circumferential gallbladder wall thickening of 9 mm -10 mm with internal striations. Equivocal sonographic Murphy's sign, with pain medication administered previous to the scan. Common bile duct: Diameter: 6 mm Liver: Unremarkable appearance of liver parenchyma. Evidence of early biliary ductal dilatation, which was not present on the comparison CT of 08/25/2014 IVC: No abnormality visualized. Pancreas: Visualized portion unremarkable. Spleen: Size and appearance within normal limits. Right Kidney: Length: 10.4 cm. Symmetric echogenicity to the left, and similar to the adjacent liver parenchyma. No hydronephrosis. Anechoic cystic structure with through transmission measures 1 cm x 1 cm x 9 mm. Left Kidney: Length: 11.0 cm. Symmetric echogenicity to the right. No hydronephrosis. Anechoic cystic structure with through transmission on the lateral cortex measures  12 mm x 12 minute by 14 mm. Additional small superior cortical lesion with through transmission measures 7 mm x 5 mm x 7 mm. Abdominal aorta: No aneurysm visualized. Other findings: None. IMPRESSION: Ultrasound is equivocal for acute cholecystitis. Findings which support the diagnosis include circumferential thickened gallbladder wall with internal striations, presence of cholelithiasis, and apparent early intrahepatic biliary ductal  dilation. Sonographic Percell Miller sign is negative, however, the patient was administered pain medication. HIDA study may be useful to evaluate for acute cholecystitis, however, this may not characterize the hepatic ductal dilation, for which contrast-enhanced CT or MR would be more appropriate. Bilateral Bosniak 1 renal cysts. These results were called by telephone at the time of interpretation on 08/04/2016 at 12:22 pm to Dr. Dorie Rank , who verbally acknowledged these results. Electronically Signed   By: Corrie Mckusick D.O.   On: 08/04/2016 12:24   Dg Abd Acute W/chest  Result Date: 08/04/2016 CLINICAL DATA:  Nausea, vomiting and abdominal pain. EXAM: DG ABDOMEN ACUTE W/ 1V CHEST COMPARISON:  Chest x-ray on 04/08/2014 FINDINGS: Status post prior CABG. The heart size is normal. There is stable tortuosity of the thoracic aorta. There is no evidence of pulmonary edema, consolidation, pneumothorax, nodule or pleural fluid. Abdominal films show no evidence of acute bowel obstruction or significant ileus. No evidence of free intraperitoneal air. No abnormal calcifications identified. Bony structures are unremarkable. IMPRESSION: No acute findings in the chest or abdomen. Electronically Signed   By: Aletta Edouard M.D.   On: 08/04/2016 08:17    Anti-infectives: Anti-infectives    None       Assessment/Plan Gallstone pancreatitis  - afebrile, VSS - RUQ U/S 08/04/16 w/ cholelithiasis, wall thickening, +sonigraphic murphy's, and early CBD dilatation (6 mm)  - lipase 28, total bilirubin1.6 from 2.2, and LFT's improving (AST 462, ALT 469, AP 170)   - intermediate risk for surgery per cardiology - appreciate cardiology consult and recommendations. - will proceed to OR today for lap chole with IOC by Dr. 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

## 2016-08-06 NOTE — Anesthesia Postprocedure Evaluation (Signed)
Anesthesia Post Note  Patient: [name]  Procedure(s) Performed: Procedure(s) (LRB): LAPAROSCOPIC CHOLECYSTECTOMY WITH INTRAOPERATIVE CHOLANGIOGRAM (N/A)     Patient location during evaluation: PACU Anesthesia Type: General Level of consciousness: awake, awake and alert and oriented Pain management: pain level controlled Vital Signs Assessment: post-procedure vital signs reviewed and stable Respiratory status: spontaneous breathing, nonlabored ventilation and respiratory function stable Cardiovascular status: blood pressure returned to baseline Anesthetic complications: no    Last Vitals:  Vitals:   08/06/16 1619 08/06/16 1713  BP: (!) 173/80 (!) 154/72  Pulse: 79 80  Resp: 17 18  Temp: 36.5 C     Last Pain:  Vitals:   08/06/16 1852  TempSrc:   PainSc: 7                  Ayisha Pol COKER

## 2016-08-06 NOTE — Op Note (Signed)
08/04/2016 - 08/06/2016  2:34 PM  PATIENT:  [name]  66 y.o. female  PRE-OPERATIVE DIAGNOSIS:  Gallstones/pancreatitis  POST-OPERATIVE DIAGNOSIS:  Gallstones/pancreatitis  PROCEDURE:  Procedure(s): LAPAROSCOPIC CHOLECYSTECTOMY WITH INTRAOPERATIVE CHOLANGIOGRAM  SURGEON:  Surgeon(s): Georganna Skeans, MD  ASSISTANTS: none   ANESTHESIA:   local and general  EBL:  Total I/O In: 450 [I.V.:450] Out: 20 [Blood:20]  BLOOD ADMINISTERED:none  DRAINS: none   SPECIMEN:  Excision  DISPOSITION OF SPECIMEN:  PATHOLOGY  COUNTS:  YES  DICTATION: .Dragon Dictation Findings: Cholangiogram negative  Procedure in detail: Ms. Harkins presents for cholecystectomy. She was identified in the preop holding area. Informed consent was obtained. She received intravenous antibiotics. She was brought to the operating room and general endotracheal anesthesia was administered by the anesthesia staff. Her abdomen was prepped and draped in a sterile fashion. We did time out procedure.The infraumbilical region was infiltrated with local. Infraumbilical incision was made. Subcutaneous tissues were dissected down revealing the anterior fascia. This was divided sharply along the midline. Peritoneal cavity was entered under direct vision without complication. A 0 Vicryl pursestring was placed around the fascial opening. Hassan trocar was inserted into the abdomen. The abdomen was insufflated with carbon dioxide in standard fashion. Under direct vision a 5 mm epigastric and 2 5 mm right ports were placed. Local was used at each port site. The dome of the gallbladder was retracted superior medially. The infundibulum was retracted inferolaterally. There was some inflammation of the gallbladder. Dissection began laterally and progressed medially easily identifying the cystic duct and cystic artery. Dissection continued until a critical view was obtained around the cystic duct. Once this was accomplished, a clip was  placed on the infundibular cystic duct junction and a small nick was made in the cystic duct. Cholangiocatheter was inserted. Intraoperative cholangiogram was obtained and showed some dilatation of the common bile duct but no common bile duct filling defects. Good flow of contrast into the duodenum. Catheter was removed. 3 clips were placed proximally on the cystic duct and it was divided. 2 clips were placed proximally and one was placed distally on the cystic artery and it was divided. The gallbladder was taken off the liver bed using Bovie cautery. The liver bed was cauterized well as it was friable. The gallbladder was placed in a bag and removed from the abdomen. It was sent to pathology. Liver bed was rechecked. A piece of Surgicel snow was placed there and there was good hemostasis at that point. There was irrigated. Clips remain in good position. The bed now appeared dry. Ports were removed under direct vision. Pneumoperitoneum was released. Infraumbilical fascia was closed by tying the pursestring. All 4 wounds were copiously irrigated and the skin of each was closed with running 4-0 Vicryl subcuticular followed by Dermabond. All counts were correct. She tolerated procedure well without apparent complications and was taken recovery in stable condition.  PATIENT DISPOSITION:  PACU - hemodynamically stable.   Delay start of Pharmacological VTE agent (>24hrs) due to surgical blood loss or risk of bleeding:  no  Georganna Skeans, MD, MPH, FACS Pager: [phone]  7/9/20182:34 PM

## 2016-08-06 NOTE — Progress Notes (Addendum)
   Subjective: [name] was seen laying in her bed this morning, doing well, and pleasant. She denied any abdominal pain with pain medication, chest pain, nausea/vomiting, no changes in stool color or consistency, or sob. She said she was ready for her surgery today.  Objective:  Vital signs in last 24 hours: Vitals:   08/05/16 1631 08/05/16 2235 08/06/16 0535 08/06/16 1220  BP: (!) 145/68 (!) 165/74 (!) 141/72 (!) 158/70  Pulse: 88 92 86 78  Resp: 15 18 18 18   Temp: 100.2 F (37.9 C) (!) 100.5 F (38.1 C) 98.9 F (37.2 C) 98.7 F (37.1 C)  TempSrc:   Oral Oral  SpO2: 98% 96% 98% 100%  Weight:      Height:       Physical Exam  Constitutional: She is well-developed, well-nourished, and in no distress. No distress.  HENT:  Head: Normocephalic and atraumatic.  Eyes: Scleral icterus (mild) is present.  Cardiovascular: Normal rate, regular rhythm, normal heart sounds and intact distal pulses.   Pulmonary/Chest: Effort normal and breath sounds normal. No respiratory distress.  Abdominal: Soft. Bowel sounds are normal. She exhibits no distension. There is no tenderness.   Assessment/Plan: Acute gallstone pancreatitis -NPO for laparoscopic cholecystectomy today (08/06/16) -Lipase= 28 from 123, AST=462 from 889, ALT=469 from 687, Tbili=1.6 from 2.2 -laprascopic cholecystectomy on 08/06/16 -Dilaudid 0.5mg  iv and morphine 4 prn for pain              Diabetes mellitus type 2, controlled, without complications ([name]) -Pt on fluid diet till 0:00 08/06/16 then NPO -Blood glucose in normal range, continue Glucose checks  Coronary artery disease involving native coronary artery of native heart without angina pectoris S/P CABG x 5 -No active chest pain or shortness of breath -No further management necessary at this time   Dispo: Anticipated discharge in approximately 1 day(s).   Lars Mage, MD Internal Medicine PGY1 Pager:[phone] 08/06/2016, 2:21 PM

## 2016-08-07 ENCOUNTER — Encounter (HOSPITAL_COMMUNITY): Payer: Self-pay | Admitting: General Surgery

## 2016-08-07 LAB — BASIC METABOLIC PANEL
ANION GAP: 9 (ref 5–15)
Anion gap: 11 (ref 5–15)
BUN: 23 mg/dL — ABNORMAL HIGH (ref 6–20)
BUN: 30 mg/dL — ABNORMAL HIGH (ref 6–20)
CALCIUM: 8.4 mg/dL — AB (ref 8.9–10.3)
CALCIUM: 8.1 mg/dL — AB (ref 8.9–10.3)
CO2: 22 mmol/L (ref 22–32)
CO2: 19 mmol/L — ABNORMAL LOW (ref 22–32)
Chloride: 102 mmol/L (ref 101–111)
Chloride: 103 mmol/L (ref 101–111)
Creatinine, Ser: 1.56 mg/dL — ABNORMAL HIGH (ref 0.44–1.00)
Creatinine, Ser: 1.95 mg/dL — ABNORMAL HIGH (ref 0.44–1.00)
GFR calc Af Amer: 30 mL/min — ABNORMAL LOW (ref >60)
GFR, EST AFRICAN AMERICAN: 39 mL/min — AB (ref >60)
GFR, EST NON AFRICAN AMERICAN: 34 mL/min — AB (ref >60)
GFR, EST NON AFRICAN AMERICAN: 26 mL/min — AB (ref >60)
Glucose, Bld: 220 mg/dL — ABNORMAL HIGH (ref 65–99)
Glucose, Bld: 291 mg/dL — ABNORMAL HIGH (ref 65–99)
POTASSIUM: 4.1 mmol/L (ref 3.5–5.1)
POTASSIUM: 4.6 mmol/L (ref 3.5–5.1)
Sodium: 135 mmol/L (ref 135–145)
Sodium: 131 mmol/L — ABNORMAL LOW (ref 135–145)

## 2016-08-07 LAB — CBC
HEMATOCRIT: 27.5 % — AB (ref 36.0–46.0)
Hemoglobin: 8.9 g/dL — ABNORMAL LOW (ref 12.0–15.0)
MCH: 27.6 pg (ref 26.0–34.0)
MCHC: 32.4 g/dL (ref 30.0–36.0)
MCV: 85.4 fL (ref 78.0–100.0)
Platelets: 122 10*3/uL — ABNORMAL LOW (ref 150–400)
RBC: 3.22 MIL/uL — AB (ref 3.87–5.11)
RDW: 14.5 % (ref 11.5–15.5)
WBC: 8.8 10*3/uL (ref 4.0–10.5)

## 2016-08-07 MED ORDER — ENOXAPARIN SODIUM 30 MG/0.3ML ~~LOC~~ SOLN
30.0000 mg | SUBCUTANEOUS | Status: DC
  Administered 2016-08-08: 30 mg via SUBCUTANEOUS
  Filled 2016-08-07: qty 0.3

## 2016-08-07 MED ORDER — CARVEDILOL 25 MG PO TABS
25.0000 mg | ORAL_TABLET | Freq: Two times a day (BID) | ORAL | Status: DC
  Administered 2016-08-07 – 2016-08-08 (×2): 25 mg via ORAL
  Filled 2016-08-07 (×2): qty 1

## 2016-08-07 MED ORDER — SODIUM CHLORIDE 0.9 % IV BOLUS (SEPSIS)
1000.0000 mL | Freq: Once | INTRAVENOUS | Status: AC
  Administered 2016-08-07: 1000 mL via INTRAVENOUS

## 2016-08-07 MED ORDER — ENOXAPARIN SODIUM 40 MG/0.4ML ~~LOC~~ SOLN
40.0000 mg | SUBCUTANEOUS | Status: DC
  Administered 2016-08-07: 40 mg via SUBCUTANEOUS
  Filled 2016-08-07: qty 0.4

## 2016-08-07 MED ORDER — OXYCODONE HCL 5 MG PO TABS: 5.0000 mg | ORAL_TABLET | Freq: Four times a day (QID) | ORAL | 0 refills | Status: DC | PRN

## 2016-08-07 NOTE — Progress Notes (Signed)
  Date: 08/07/2016  Patient name: [name]  Medical record number: [id number]  Date of birth: 1950/06/22   I have seen and evaluated this patient and I have discussed the plan of care with the house staff. Please see their note for complete details. I concur with their findings.  Bartholomew Crews, MD 08/07/2016, 3:05 PM

## 2016-08-07 NOTE — Progress Notes (Signed)
Post void residual - 100cc. Will continue to monitor.

## 2016-08-07 NOTE — Progress Notes (Signed)
[facility] Progress Note  1 Day Post-Op  Subjective: CC: abdominal soreness Some soreness since surgery but pain is controlled. Tolerated clear liquids at breakfast. Mobilizing to bathroom. Denies fever, chills, nausea, vomiting.   Post-operative care discussed with patient and family member including bathing, physical activity, lifting restrictions, and possible post-operative diarrhea and pt voiced understanding. All questions answered.  Objective: Vital signs in last 24 hours: Temp:  [97 F (36.1 C)-100 F (37.8 C)] 100 F (37.8 C) (07/10 0800) Pulse Rate:  [77-103] 97 (07/10 0800) Resp:  [11-19] 16 (07/10 0800) BP: (123-184)/(65-91) 128/66 (07/10 0800) SpO2:  [95 %-100 %] 100 % (07/10 0800) Last BM Date: 08/05/16  Intake/Output from previous day: 07/09 0701 - 07/10 0700 In: 833.3 [P.O.:30; I.V.:803.3] Out: 20 [Blood:20] Intake/Output this shift: Total I/O In: 60 [P.O.:60] Out: -   PE: Gen:  Alert, NAD, pleasant Card:  Regular rate and rhythm, pedal pulses 2+ BL Pulm:  Normal effort, clear to auscultation bilaterally Abd: Soft, appropriately tender, non-distended, bowel sounds present in all 4 quadrants, trochar site incisions C/D/I Skin: warm and dry, no rashes  Psych: A&Ox3   Lab Results:   Recent Labs  08/06/16 0454 08/07/16 0736  WBC 6.0 8.8  HGB 8.8* 8.9*  HCT 27.6* 27.5*  PLT 114* 122*   BMET  Recent Labs  08/06/16 0454 08/07/16 0736  NA 140 135  K 4.2 4.1  CL 109 102  CO2 27 22  GLUCOSE 89 220*  BUN 12 23*  CREATININE 1.12* 1.56*  CALCIUM 8.7* 8.4*   PT/INR No results for input(s): LABPROT, INR in the last 72 hours. CMP     Component Value Date/Time   NA 135 08/07/2016 0736   NA 145 (H) 09/01/2015 1631   K 4.1 08/07/2016 0736   CL 102 08/07/2016 0736   CO2 22 08/07/2016 0736   GLUCOSE 220 (H) 08/07/2016 0736   BUN 23 (H) 08/07/2016 0736   BUN 16 09/01/2015 1631   CREATININE 1.56 (H) 08/07/2016 0736   CREATININE  1.14 (H) 06/26/2016 1122   CALCIUM 8.4 (L) 08/07/2016 0736   PROT 6.2 (L) 08/06/2016 0454   ALBUMIN 2.4 (L) 08/06/2016 0454   AST 462 (H) 08/06/2016 0454   ALT 469 (H) 08/06/2016 0454   ALKPHOS 170 (H) 08/06/2016 0454   BILITOT 1.6 (H) 08/06/2016 0454   GFRNONAA 34 (L) 08/07/2016 0736   GFRNONAA 35 (L) 08/18/2015 1135   GFRAA 39 (L) 08/07/2016 0736   GFRAA 41 (L) 08/18/2015 1135   Lipase     Component Value Date/Time   LIPASE 28 08/06/2016 0454       Studies/Results: Dg Cholangiogram Operative  Result Date: 08/06/2016 CLINICAL DATA:  Intraoperative cholangiogram during laparoscopic cholecystectomy. EXAM: INTRAOPERATIVE CHOLANGIOGRAM FLUOROSCOPY TIME:  6 seconds COMPARISON:  Abdominal ultrasound - 08/04/2016 FINDINGS: A single spot intraoperative cholangiographic image of the right upper abdominal quadrant during laparoscopic cholecystectomy are provided for review. Surgical clips overlie the expected location of the gallbladder fossa. Contrast injection demonstrates selective cannulation of the central aspect of the cystic duct. There is passage of contrast through the central aspect of the cystic duct with filling of a non dilated common bile duct. There is passage of contrast though the CBD and into the descending portion of the duodenum. There is minimal reflux of injected contrast into the common hepatic duct and central aspect of the non dilated intrahepatic biliary system. There are no discrete filling defects within the opacified portions of the biliary system  to suggest the presence of choledocholithiasis. IMPRESSION: No evidence of choledocholithiasis. Electronically Signed   By: Sandi Mariscal M.D.   On: 08/06/2016 14:22    Anti-infectives: Anti-infectives    Start     Dose/Rate Route Frequency Ordered Stop   08/06/16 1245  ceFAZolin (ANCEF) IVPB 2g/100 mL premix     2 g 200 mL/hr over 30 Minutes Intravenous On call to O.R. 08/06/16 1236 08/06/16 1333   08/06/16 1239   ceFAZolin (ANCEF) 2-4 GM/100ML-% IVPB    Comments:  Debbe Bales, Meredit: cabinet override      08/06/16 1239 08/06/16 1333     Assessment/Plan Gallstone pancreatitis  POD#1 S/P laparoscopic cholecystectomy w/ IOC  -afebrile, VSS  - advance diet to SOFT as tolerated. - no role for PO abx at discharge - mobilize and IS - stable for discharge from a surgical standpoint if tolerates diet at lunch. I have prescribed oxycodone 5 mg tabs to patient at discharge. I will arrange her post-opertive follow up at our office.  DM2 CAD s/p CABGx5  FEN: full liquids; SCr increased to 1.56, hyperglycemia (220) ID: perioperative Ancef 7/9 VTE: Lovenox, SCD's   LOS: 3 days    Jill Alexanders , [facility] 08/07/2016, 10:04 AM Pager: [phone] Consults: [phone] Mon-Fri 7:00 am-4:30 pm Sat-Sun 7:00 am-11:30 am

## 2016-08-07 NOTE — Care Management Note (Signed)
Case Management Note  Patient Details  Name: [name] MRN: [id number] Date of Birth: 09-03-1950  Subjective/Objective:       Admitted with acute gallstone pancreatitis.From home with daughter.         S/p lap cholecystectomy 08/06/2016            Lance Muss (Daughter) Bentlie Withem [facility] [phone]      TRV:[id number] Melvin  Action/Plan: Plan is to d/cto home today.  Expected Discharge Date:  08/07/16               Expected Discharge Plan:  Home/Self Care  In-House Referral:     Discharge planning Services  CM Consult  Post Acute Care Choice:    Choice offered to:     DME Arranged:    DME Agency:     HH Arranged:    HH Agency:     Status of Service:  Completed, signed off  If discussed at [employer] of Stay Meetings, dates discussed:    Additional Comments:  Sharin Mons, RN 08/07/2016, 11:19 AM

## 2016-08-07 NOTE — Progress Notes (Signed)
   Subjective: [name] was seen sitting in her bed. She was pleasant, denied any nausea/vomiting, sob, chest pain, or difficulty urinating. She said that she has been able to ambulate in her room and get to the bathroom.   Objective:  Vital signs in last 24 hours: Vitals:   08/06/16 1713 08/06/16 2118 08/07/16 0508 08/07/16 0800  BP: (!) 154/72 (!) 147/81 123/65 128/66  Pulse: 80 97 (!) 103 97  Resp: 18 18 18 16   Temp:  98.4 F (36.9 C) 99.4 F (37.4 C) 100 F (37.8 C)  TempSrc:  Oral Oral Oral  SpO2: 97% 96% 95% 100%  Weight:      Height:       Physical Exam  Constitutional: She is oriented to person, place, and time and well-developed, well-nourished, and in no distress. No distress.  HENT:  Head: Normocephalic and atraumatic.  Cardiovascular: Normal rate, regular rhythm, normal heart sounds and intact distal pulses.   Pulmonary/Chest: Effort normal and breath sounds normal. No respiratory distress. She has no wheezes.  Abdominal: Soft. Bowel sounds are normal. There is tenderness (nonlocalized tenderness to palpation, particularly prominant in epigastric and rlq).  Neurological: She is alert and oriented to person, place, and time.    Assessment/Plan: Acute gallstone pancreatitis -Lipase= 28 (7/9) from 123, AST (08/06/16)=462 from 889, ALT=469 from 687, Tbili=1.6 from 2.2 -laprascopic cholecystectomy on 08/06/16 -Dilaudid 0.5mg  iv and morphine 4 prn for pain -Currently on full liquid diet with plan to adv diet later today (7/10)  AKI  Pt's cr increased from 1.12 to 1.56 on 08/07/16 Likely due to pre-renal etiology from volume loss  Reassess cr change later today 7/10 prior to discharge  HTN Post-op bp was elevated, restarted home meds. Currently on lisinopril 40mg  qd  Diabetes mellitus type 2, controlled, without complications ([facility]) -Pt on full liquid diet with plan to adv later today 08/07/16 -Blood glucose in normal range, continue Glucose checks  Coronary  artery disease involving native coronary artery of native heart without angina pectoris S/P CABG x 5 -No active chest pain or shortness of breath -No further management necessary at this time   Dispo: Anticipated discharge in approximately today (7/10).   Lars Mage, MD Internal Medicine PGY1 Pager:[phone] 08/07/2016, 10:54 AM

## 2016-08-07 NOTE — Progress Notes (Signed)
Subjective: [name] feels well this morning. She was seen in her room with her daughter at bedside. Yesterday post-op, her BP was in the high ranges (max 184/91) and she was restarted on home lisinopril 40mg  yesterday evening. Today she denies any abdominal pain, nausea, vomiting, SOB, chest pain. She reports urination this morning, no dysuria. She is on a full liquid diet and reports that ice makes her stomach hurt so nurse will no longer put ice in her water.  Objective: Vital signs in last 24 hours: Vitals:   08/06/16 1713 08/06/16 2118 08/07/16 0508 08/07/16 0800  BP: (!) 154/72 (!) 147/81 123/65 128/66  Pulse: 80 97 (!) 103 97  Resp: 18 18 18 16   Temp:  98.4 F (36.9 C) 99.4 F (37.4 C) 100 F (37.8 C)  TempSrc:  Oral Oral Oral  SpO2: 97% 96% 95% 100%  Weight:      Height:        Intake/Output Summary (Last 24 hours) at 08/07/16 1026 Last data filed at 08/07/16 0736  Gross per 24 hour  Intake           893.33 ml  Output               20 ml  Net           873.33 ml   BP 128/66 (BP Location: Left Arm)   Pulse 97   Temp 100 F (37.8 C) (Oral)   Resp 16   Ht 5\' 2"  (1.575 m)   Wt 77.5 kg (170 lb 14.4 oz)   SpO2 100%   BMI 31.26 kg/m  General appearance: alert, cooperative and appears stated age Eyes: negative findings: lids and lashes normal and conjunctivae and sclerae normal Lungs: clear to auscultation bilaterally Heart: regular rate and rhythm, S1, S2 normal, no murmur, click, rub or gallop Abdomen: normal findings: bowel sounds normal, no masses palpable and no organomegaly and abnormal findings:  mild tenderness in the upper abdomen and over lap incisions Extremities: extremities normal, atraumatic, no cyanosis or edema Skin: Skin color, texture, turgor normal. No rashes or lesions Lab Results: Glucose 220 BUN 23 Creatinine 1.56 GFR 39 Hg/HCT 8.9/27.5 PLT 122 Micro Results: Recent Results (from the past 240 hour(s))  Surgical pcr screen     Status:  None   Collection Time: 08/06/16  2:14 AM  Result Value Ref Range Status   MRSA, PCR NEGATIVE NEGATIVE Final   Staphylococcus aureus NEGATIVE NEGATIVE Final    Comment:        The Xpert SA Assay (FDA approved for NASAL specimens in patients over 48 years of age), is one component of a comprehensive surveillance program.  Test performance has been validated by [facility] for patients greater than or equal to 60 year old. It is not intended to diagnose infection nor to guide or monitor treatment.    Studies/Results: Dg Cholangiogram Operative  Result Date: 08/06/2016 CLINICAL DATA:  Intraoperative cholangiogram during laparoscopic cholecystectomy. EXAM: INTRAOPERATIVE CHOLANGIOGRAM FLUOROSCOPY TIME:  6 seconds COMPARISON:  Abdominal ultrasound - 08/04/2016 FINDINGS: A single spot intraoperative cholangiographic image of the right upper abdominal quadrant during laparoscopic cholecystectomy are provided for review. Surgical clips overlie the expected location of the gallbladder fossa. Contrast injection demonstrates selective cannulation of the central aspect of the cystic duct. There is passage of contrast through the central aspect of the cystic duct with filling of a non dilated common bile duct. There is passage of contrast though the CBD and into the  descending portion of the duodenum. There is minimal reflux of injected contrast into the common hepatic duct and central aspect of the non dilated intrahepatic biliary system. There are no discrete filling defects within the opacified portions of the biliary system to suggest the presence of choledocholithiasis. IMPRESSION: No evidence of choledocholithiasis. Electronically Signed   By: Sandi Mariscal M.D.   On: 08/06/2016 14:22   Medications: I have reviewed the patient's current medications. Scheduled Meds: . enoxaparin (LOVENOX) injection  40 mg Subcutaneous Q24H  . lisinopril  40 mg Oral Daily   Continuous Infusions: . lactated ringers  10 mL/hr at 08/06/16 1247  . sodium chloride     PRN Meds:.morphine injection, oxyCODONE, pneumococcal 23 valent vaccine, promethazine Assessment/Plan: Principal Problem:   Acute gallstone pancreatitis Active Problems:   Diabetes mellitus type 2, controlled, without complications ([place])   Hyperlipidemia   Essential hypertension   Coronary artery disease involving native coronary artery of native heart without angina pectoris   S/P CABG x 5   Cardiomyopathy, ischemic, EF 25-30%   Acute pancreatitis   Chronic systolic heart failure [facility])  [name] is a 66yo F with acute gallstone pancreatitis s/p lap cholecystectomy. She appears well on exam today. Denies dysuria, difficulty ambulating, abdominal pain or pain at her lap incision sites. She had some generalized abdominal tenderness to palpation but this is expected post-op. Her BUN and creatinine are increased from baseline. Her GFR is decreased from baseline. This is slightly concerning for urinary retention, though she did report urination this AM. Hg and HCT remain stable. Her PLT is trending upwards.  Acute gallstone pancreatitis s/p lap cholecystectomy - Currently on full liquid diet, change to full diet as tolerated. We encouraged her to start with bland, low fat foods. - Encourage ambulation on floor. - Pain well controlled on morphine - Promethazine for nausea - Pt to follow up with PCP  DM Type 2, controlled, wo complications DM is controlled with diet. A1C was 6.2 two months ago. No medications indicated at this time.  Hyperlipidemia Well managed. Home statin not indicated at this time.  CAD s/p CABG No active chest pain or SOB. Last BP was 128/66. Continue home lisinopril 40.  Dispo: Discharge today on home medications.  This is a Careers information officer Note.  The care of the patient was discussed with Dr. Lynnae January and the assessment and plan formulated with their assistance.  Please see their attached note for official  documentation of the daily encounter.   LOS: 3 days   Murlean Caller, Medical Student 08/07/2016, 10:26 AM

## 2016-08-08 LAB — CBC
HEMATOCRIT: 23.1 % — AB (ref 36.0–46.0)
Hemoglobin: 7.5 g/dL — ABNORMAL LOW (ref 12.0–15.0)
MCH: 27.7 pg (ref 26.0–34.0)
MCHC: 32.5 g/dL (ref 30.0–36.0)
MCV: 85.2 fL (ref 78.0–100.0)
Platelets: 107 10*3/uL — ABNORMAL LOW (ref 150–400)
RBC: 2.71 MIL/uL — ABNORMAL LOW (ref 3.87–5.11)
RDW: 14.6 % (ref 11.5–15.5)
WBC: 7.6 10*3/uL (ref 4.0–10.5)

## 2016-08-08 LAB — COMPREHENSIVE METABOLIC PANEL
ALBUMIN: 2.1 g/dL — AB (ref 3.5–5.0)
ALT: 225 U/L — ABNORMAL HIGH (ref 14–54)
AST: 210 U/L — AB (ref 15–41)
Alkaline Phosphatase: 136 U/L — ABNORMAL HIGH (ref 38–126)
Anion gap: 6 (ref 5–15)
BUN: 30 mg/dL — AB (ref 6–20)
CHLORIDE: 107 mmol/L (ref 101–111)
CO2: 24 mmol/L (ref 22–32)
Calcium: 8.1 mg/dL — ABNORMAL LOW (ref 8.9–10.3)
Creatinine, Ser: 1.54 mg/dL — ABNORMAL HIGH (ref 0.44–1.00)
GFR calc Af Amer: 39 mL/min — ABNORMAL LOW (ref >60)
GFR calc non Af Amer: 34 mL/min — ABNORMAL LOW (ref >60)
GLUCOSE: 133 mg/dL — AB (ref 65–99)
POTASSIUM: 4.1 mmol/L (ref 3.5–5.1)
Sodium: 137 mmol/L (ref 135–145)
Total Bilirubin: 0.8 mg/dL (ref 0.3–1.2)
Total Protein: 6 g/dL — ABNORMAL LOW (ref 6.5–8.1)

## 2016-08-08 MED ORDER — SENNA 8.6 MG PO TABS
1.0000 | ORAL_TABLET | Freq: Every day | ORAL | Status: DC
  Administered 2016-08-08: 8.6 mg via ORAL
  Filled 2016-08-08: qty 1

## 2016-08-08 MED ORDER — PNEUMOCOCCAL VAC POLYVALENT 25 MCG/0.5ML IJ INJ
0.5000 mL | INJECTION | Freq: Once | INTRAMUSCULAR | Status: AC
  Administered 2016-08-08: 0.5 mL via INTRAMUSCULAR
  Filled 2016-08-08: qty 0.5

## 2016-08-08 MED ORDER — SENNA 8.6 MG PO TABS: 2.0000 | ORAL_TABLET | Freq: Every day | ORAL | 0 refills | Status: AC

## 2016-08-08 NOTE — Progress Notes (Signed)
Subjective: [name] reports doing well today. She was sitting up in bed and appeared comfortable. She denied N/V, abdominal pain, sob, chest pain, difficulty urinating. She is tolerating soft foods with no complaints. She is ambulating around the floor without difficulty.  Objective: Vital signs in last 24 hours: Vitals:   08/07/16 1508 08/07/16 2235 08/07/16 2235 08/08/16 0519  BP: 116/63 110/62  (!) 125/59  Pulse: 94 89  81  Resp: 17 18  16   Temp: 98.5 F (36.9 C)  99.4 F (37.4 C) 98.8 F (37.1 C)  TempSrc: Oral     SpO2: 100% 96%  99%  Weight:      Height:        Intake/Output Summary (Last 24 hours) at 08/08/16 1023 Last data filed at 08/08/16 0900  Gross per 24 hour  Intake              420 ml  Output              850 ml  Net             -430 ml   BP (!) 125/59 (BP Location: Right Arm)   Pulse 81   Temp 98.8 F (37.1 C)   Resp 16   Ht 5' 2"  (1.575 m)   Wt 77.5 kg (170 lb 14.4 oz)   SpO2 99%   BMI 31.26 kg/m  General appearance: alert, cooperative and appears stated age Lungs: clear to auscultation bilaterally Heart: regular rate and rhythm, S1, S2 normal, no murmur, click, rub or gallop Abdomen: soft, non-tender; bowel sounds normal; no masses,  no organomegaly Lab Results: BUN 30 Cr 1.54 Alk Phos 136 AST/ALT 210/225 Tbili 0.8 GFR 39 Hg/HCT 7.5/23.1 PLT 107 Micro Results: Recent Results (from the past 240 hour(s))  Surgical pcr screen     Status: None   Collection Time: 08/06/16  2:14 AM  Result Value Ref Range Status   MRSA, PCR NEGATIVE NEGATIVE Final   Staphylococcus aureus NEGATIVE NEGATIVE Final    Comment:        The Xpert SA Assay (FDA approved for NASAL specimens in patients over 65 years of age), is one component of a comprehensive surveillance program.  Test performance has been validated by [facility] for patients greater than or equal to 39 year old. It is not intended to diagnose infection nor to guide or monitor  treatment.    Studies/Results: Dg Cholangiogram Operative  Result Date: 08/06/2016 CLINICAL DATA:  Intraoperative cholangiogram during laparoscopic cholecystectomy. EXAM: INTRAOPERATIVE CHOLANGIOGRAM FLUOROSCOPY TIME:  6 seconds COMPARISON:  Abdominal ultrasound - 08/04/2016 FINDINGS: A single spot intraoperative cholangiographic image of the right upper abdominal quadrant during laparoscopic cholecystectomy are provided for review. Surgical clips overlie the expected location of the gallbladder fossa. Contrast injection demonstrates selective cannulation of the central aspect of the cystic duct. There is passage of contrast through the central aspect of the cystic duct with filling of a non dilated common bile duct. There is passage of contrast though the CBD and into the descending portion of the duodenum. There is minimal reflux of injected contrast into the common hepatic duct and central aspect of the non dilated intrahepatic biliary system. There are no discrete filling defects within the opacified portions of the biliary system to suggest the presence of choledocholithiasis. IMPRESSION: No evidence of choledocholithiasis. Electronically Signed   By: Sandi Mariscal M.D.   On: 08/06/2016 14:22   Medications: I have reviewed the patient's current medications. Scheduled Meds: .  carvedilol  25 mg Oral BID WC  . enoxaparin (LOVENOX) injection  30 mg Subcutaneous Q24H   Continuous Infusions: . lactated ringers 10 mL/hr at 08/06/16 1247   PRN Meds:.oxyCODONE, pneumococcal 23 valent vaccine, promethazine Assessment/Plan: Principal Problem:   Acute gallstone pancreatitis Active Problems:   Diabetes mellitus type 2, controlled, without complications ([place])   Hyperlipidemia   Essential hypertension   Coronary artery disease involving native coronary artery of native heart without angina pectoris   S/P CABG x 5   Cardiomyopathy, ischemic, EF 25-49%   Chronic systolic heart failure [facility])  Ms.  [name] is a 66yo F now POD#2 lap chole for acute gallstone pancreatitis with AKI. She is stable and doing well. Her creatinine decreased from 1.95 yesterday afternoon to 1.54 today. Her alk phos, AST, and ALT continue to downtrend.   Acute gallstone pancreatitis -08/06/16 Lipase 28 from 123 -08/08/16 AST 210 from 462, ALT 225 from 469, Tbili 0.8 from 1.6 -Laprascopic cholecystectomy on 08/06/16 -Dilaudid 0.22m iv and morphine 4 prn for pain -Tolerating soft diet -Discharge with follow up in clinic in 2 weeks  AKI  -Creatinine decreased from 1.95 to 1.54 today -Likely due to pre-renal etiology from volume loss rather than post-renal etiology (post void residual negative)  HTN -Discontinued lisinopril 40 d/t AKI -Restart hypertensive medications per clinic physician recommendation  Diabetes mellitus type 2, controlled, without complications ([place] -Blood glucose in normal range, continue Glucose checks  Coronary artery disease involving native coronary artery of native heart without angina pectoris S/P CABG x 5 -No active chest pain or shortness of breath -No further management necessary at this time  Dispo: Anticipated discharge today (7/11) with follow up in clinic in 2 weeks.  This is a MCareers information officerNote.  The care of the patient was discussed with Dr. BLynnae Januaryand the assessment and plan formulated with their assistance.  Please see their attached note for official documentation of the daily encounter.   LOS: 4 days   NMurlean Caller Medical Student 08/08/2016, 10:23 AM

## 2016-08-08 NOTE — Progress Notes (Signed)
[facility] Progress Note  2 Days Post-Op  Subjective: CC:  Abdominal soreness controlled with PO main medication. Tolerating PO without nausea/vomiting. Reports that she is drinking liquids but not very much. Mobilizing. Urinating without issue. Denies fever, chills, nausea, vomiting.   Kept overnight for post-operative AKI. CMET this AM pending. Objective: Vital signs in last 24 hours: Temp:  [98.5 F (36.9 C)-100 F (37.8 C)] 98.8 F (37.1 C) (07/11 0519) Pulse Rate:  [81-97] 81 (07/11 0519) Resp:  [16-18] 16 (07/11 0519) BP: (107-128)/(59-66) 125/59 (07/11 0519) SpO2:  [96 %-100 %] 99 % (07/11 0519) Last BM Date: 08/05/16  Intake/Output from previous day: 07/10 0701 - 07/11 0700 In: 240 [P.O.:240] Out: 850 [Urine:850] Intake/Output this shift: No intake/output data recorded.  PE: Gen:  Alert, NAD, pleasant Card:  Regular rate and rhythm, pedal pulses 2+ BL Pulm:  Normal effort, clear to auscultation bilaterally Abd: Soft, appropriately tender, non-distended, bowel sounds present in all 4 quadrants, trochar site incisions C/D/I Skin: warm and dry, no rashes  Psych: A&Ox3   Lab Results:   Recent Labs  08/06/16 0454 08/07/16 0736  WBC 6.0 8.8  HGB 8.8* 8.9*  HCT 27.6* 27.5*  PLT 114* 122*   BMET  Recent Labs  08/07/16 0736 08/07/16 1406  NA 135 131*  K 4.1 4.6  CL 102 103  CO2 22 19*  GLUCOSE 220* 291*  BUN 23* 30*  CREATININE 1.56* 1.95*  CALCIUM 8.4* 8.1*   PT/INR No results for input(s): LABPROT, INR in the last 72 hours. CMP     Component Value Date/Time   NA 131 (L) 08/07/2016 1406   NA 145 (H) 09/01/2015 1631   K 4.6 08/07/2016 1406   CL 103 08/07/2016 1406   CO2 19 (L) 08/07/2016 1406   GLUCOSE 291 (H) 08/07/2016 1406   BUN 30 (H) 08/07/2016 1406   BUN 16 09/01/2015 1631   CREATININE 1.95 (H) 08/07/2016 1406   CREATININE 1.14 (H) 06/26/2016 1122   CALCIUM 8.1 (L) 08/07/2016 1406   PROT 6.2 (L) 08/06/2016 0454   ALBUMIN 2.4 (L) 08/06/2016 0454   AST 462 (H) 08/06/2016 0454   ALT 469 (H) 08/06/2016 0454   ALKPHOS 170 (H) 08/06/2016 0454   BILITOT 1.6 (H) 08/06/2016 0454   GFRNONAA 26 (L) 08/07/2016 1406   GFRNONAA 35 (L) 08/18/2015 1135   GFRAA 30 (L) 08/07/2016 1406   GFRAA 41 (L) 08/18/2015 1135   Lipase     Component Value Date/Time   LIPASE 28 08/06/2016 0454       Studies/Results: Dg Cholangiogram Operative  Result Date: 08/06/2016 CLINICAL DATA:  Intraoperative cholangiogram during laparoscopic cholecystectomy. EXAM: INTRAOPERATIVE CHOLANGIOGRAM FLUOROSCOPY TIME:  6 seconds COMPARISON:  Abdominal ultrasound - 08/04/2016 FINDINGS: A single spot intraoperative cholangiographic image of the right upper abdominal quadrant during laparoscopic cholecystectomy are provided for review. Surgical clips overlie the expected location of the gallbladder fossa. Contrast injection demonstrates selective cannulation of the central aspect of the cystic duct. There is passage of contrast through the central aspect of the cystic duct with filling of a non dilated common bile duct. There is passage of contrast though the CBD and into the descending portion of the duodenum. There is minimal reflux of injected contrast into the common hepatic duct and central aspect of the non dilated intrahepatic biliary system. There are no discrete filling defects within the opacified portions of the biliary system to suggest the presence of choledocholithiasis. IMPRESSION: No evidence of choledocholithiasis. Electronically Signed  By: Sandi Mariscal M.D.   On: 08/06/2016 14:22    Anti-infectives: Anti-infectives    Start     Dose/Rate Route Frequency Ordered Stop   08/06/16 1245  ceFAZolin (ANCEF) IVPB 2g/100 mL premix     2 g 200 mL/hr over 30 Minutes Intravenous On call to O.R. 08/06/16 1236 08/06/16 1333   08/06/16 1239  ceFAZolin (ANCEF) 2-4 GM/100ML-% IVPB    Comments:  Debbe Bales, Mered[place]: cabinet override       08/06/16 1239 08/06/16 1333       Assessment/Plan Gallstone pancreatitis  POD#2 S/P laparoscopic cholecystectomy w/ IOC  -afebrile, VSS  - advance diet to SOFT as tolerated. - no role for PO abx at discharge - mobilize and IS - stable for discharge from a surgical standpoint once AKI improves. I have discussed pain control with OTC pain meds and prescribed oxycodone 5 mg tabs to patient at discharge and arranged her post-opertive follow up at our office.  AKI - per medical service. Agree likely 2/2 dehydration/pre-renal losses.     LOS: 4 days    [facility] 08/08/2016, 7:19 AM Pager: [phone] Consults: [phone] Mon-Fri 7:00 am-4:30 pm Sat-Sun 7:00 am-11:30 am

## 2016-08-08 NOTE — Progress Notes (Cosign Needed)
   Subjective: [name] was seen laying in her bed this morning. She denied any sob, chest pain, nausea/vomiting, abdominal pain. She stated that she has not had a bowel movement since her admission.   Objective:  Vital signs in last 24 hours: Vitals:   08/07/16 1508 08/07/16 2235 08/07/16 2235 08/08/16 0519  BP: 116/63 110/62  (!) 125/59  Pulse: 94 89  81  Resp: 17 18  16   Temp: 98.5 F (36.9 C)  99.4 F (37.4 C) 98.8 F (37.1 C)  TempSrc: Oral     SpO2: 100% 96%  99%  Weight:      Height:       Physical Exam  Constitutional: She is oriented to person, place, and time. She appears well-developed and well-nourished. No distress.  HENT:  Head: Normocephalic and atraumatic.  Cardiovascular: Normal rate, regular rhythm and normal heart sounds.   Pulmonary/Chest: Effort normal and breath sounds normal. No respiratory distress. She has no wheezes.  Abdominal: Soft. Bowel sounds are normal. There is tenderness (at surgical sites).  Neurological: She is alert and oriented to person, place, and time.  Psychiatric: She has a normal mood and affect. Her behavior is normal. Thought content normal.    Assessment/Plan: Acute gallstone pancreatitis -Lipase= 28 (7/9) from 123 -Liver function on 7/11 was AST=210, ALT=225, ALP=136, Tbili=0.8   -laprascopic cholecystectomy on 08/06/16 -DG cholangiogram on 08/06/16 showed no evidence of choledocholithiasis -Oxycodone 5-10 q4hr prn -Tolerated soft diet well without any difficulty  AKI  Pt's cr increased from 1.56 to 1.95 on 08/07/16 and GFR decreased from 58 to 39 Likely due to multifactorial etiology from pre-renal etiology due to volume loss and also exacerbated by new addition of lisinopril. Bladder scan on 08/07/16 showed post void residual of 100cc. Held home lisinopril This morning (08/08/16) her cr came back down to 1.54 from 1.95 and GFR went up from 30 to 39.  HTN BP has been 107-128/62-66 over the past 24 hrs. Held home lisinopril  40mg  qd due to aki.  Diabetes mellitus type 2, controlled, without complications ([place]) -Pt on full liquid diet with plan to adv later today 08/07/16 -Blood glucose in normal range, continue Glucose checks  Coronary artery disease involving native coronary artery of native heart without angina pectoris S/P CABG x 5 -No active chest pain or shortness of breath -No further management necessary at this time  Constipation: Pt has not had bowel movement in 4 days Added senna   Dispo: Anticipated discharge today.   Lars Mage, MD Internal Medicine PGY1 [phone] 08/08/2016, 11:01 AM

## 2016-08-08 NOTE — Progress Notes (Signed)
NURSING PROGRESS NOTE  [name] [id number] Discharge Data: 08/08/2016 4:33 PM Attending Provider: Bartholomew Crews, MD [id number], Sheppard Coil, MD     Renea Ee to be D/C'd Home per MD order.  Discussed with the patient the After Visit Summary and all questions fully answered. All IV's discontinued with no bleeding noted. All belongings returned to patient for patient to take home. Pt was sent home with a written prescription for oxycodone.   Last Vital Signs:  Blood pressure (!) 128/58, pulse 84, temperature 98.7 F (37.1 C), temperature source Oral, resp. rate 18, height 5\' 2"  (1.575 m), weight 77.5 kg (170 lb 14.4 oz), SpO2 99 %.  Discharge Medication List Allergies as of 08/08/2016   No Known Allergies     Medication List    STOP taking these medications   glucose blood test strip Commonly known as:  ACCU-CHEK ACTIVE STRIPS   LANCETS MICRO THIN 33G Misc   lisinopril 40 MG tablet Commonly known as:  PRINIVIL,ZESTRIL     TAKE these medications   aspirin EC 81 MG tablet Take 1 tablet (81 mg total) by mouth daily.   atorvastatin 80 MG tablet Commonly known as:  LIPITOR Take 1 tablet (80 mg total) by mouth daily.   carvedilol 25 MG tablet Commonly known as:  COREG Take 1 tablet (25 mg total) by mouth 2 (two) times daily with a meal.   oxyCODONE 5 MG immediate release tablet Commonly known as:  Oxy IR/ROXICODONE Take 1-2 tablets (5-10 mg total) by mouth every 6 (six) hours as needed for moderate pain or severe pain.   senna 8.6 MG Tabs tablet Commonly known as:  SENOKOT Take 2 tablets (17.2 mg total) by mouth daily.

## 2016-08-08 NOTE — Progress Notes (Signed)
  Date: 08/08/2016  Patient name: [name]  Medical record number: [id number]  Date of birth: [birth year]   I have seen and evaluated this patient and I have discussed the plan of care with the house staff. Please see their note for complete details. I concur with their findings with the following additions/corrections: [name] is tolerating her diet without ABD pain. No BM since prior to surgery. Cr down down trending but not yet at baseline. The etiology of her acute renal failure is most likely prerenal. She had GI losses prior to admission, was NPO peri-operatively, and is also on an ACE inhibitor. Creatinine started to normalize after her ACE inhibitor was stopped and she received IV hydration. Post renal was ruled out with a post void residual. ATN is also a possibility as she had transient hypotension with the MAP falling to 70 during her surgery. However, I think her creatinine recovered too quickly for this to be ATN. She is now on a soft diet and is stable to be discharged to home. We will not resume her ACE inhibitor until her hospital follow-up appointment.  Bartholomew Crews, MD 08/08/2016, 1:30 PM

## 2016-08-08 NOTE — Care Management Important Message (Signed)
Important Message  Patient Details  Name: [name] MRN: [id number] Date of Birth: 09-Aug-1950   Medicare Important Message Given:  Yes    [name] 08/08/2016, 1:48 PM

## 2016-08-08 NOTE — Discharge Summary (Addendum)
Name: [name] MRN: [id number] DOB: 1950-07-20 66 y.o. PCP: Neva Seat, MD  Date of Admission: 08/04/2016  7:18 AM Date of Discharge: 08/08/2016 Attending Physician: Bartholomew Crews, MD  Discharge Diagnosis:  Principal Problem:   Acute gallstone pancreatitis  Active Problems:   Diabetes mellitus type 2, controlled, without complications ([place])   Hyperlipidemia   Essential hypertension   Coronary artery disease involving native coronary artery of native heart without angina       pectoris   S/P CABG x 5   Cardiomyopathy, ischemic, EF 49-67%   Chronic systolic heart failure [facility])  Discharge Medications: Allergies as of 08/08/2016   No Known Allergies     Medication List    STOP taking these medications   glucose blood test strip Commonly known as:  ACCU-CHEK ACTIVE STRIPS   LANCETS MICRO THIN 33G Misc   lisinopril 40 MG tablet Commonly known as:  PRINIVIL,ZESTRIL     TAKE these medications   aspirin EC 81 MG tablet Take 1 tablet (81 mg total) by mouth daily.   atorvastatin 80 MG tablet Commonly known as:  LIPITOR Take 1 tablet (80 mg total) by mouth daily.   carvedilol 25 MG tablet Commonly known as:  COREG Take 1 tablet (25 mg total) by mouth 2 (two) times daily with a meal.   oxyCODONE 5 MG immediate release tablet Commonly known as:  Oxy IR/ROXICODONE Take 1-2 tablets (5-10 mg total) by mouth every 6 (six) hours as needed for moderate pain or severe pain.   senna 8.6 MG Tabs tablet Commonly known as:  SENOKOT Take 2 tablets (17.2 mg total) by mouth daily.       Disposition and follow-up:   [name] was discharged from [facility] in Good condition.  At the hospital follow up visit please address:  1.  Educate about low fat diet  2.  Labs / imaging needed at time of follow-up: cmp to follow liver enzymes and creatinine  3.  Pending labs/ test needing follow-up: none  Follow-up  Appointments: Follow-up Information    Surgery, [place]. Go on 08/21/2016.   Specialty:  General Surgery Why:  Your appointment is at 8:45 am. Please arrive 30 min prior to appt time. Bring photo ID and insurance information.  Contact information: [place]  [id number] [phone]        Neva Seat, MD On 08/15/2016.   Specialty:  Internal Medicine Why:  Appointment is on 08/15/16 at 10:45am Contact information: [place] [phone]           Hospital Course by problem list: Acute gallstone pancreatitis The patient presented with epigastric pain that was accompanied with nausea and vomiting following attending a cook out. . Lipase level on admission was 3x the normal level, liver enzymes were elevated, and ultrasound revealed gallstones and intrahepatic biliary duct dilation. We gave the patient bowel rest by keeping her npo and giving her iv normal saline. She also got promethazine for nausea. Pain management was done with dilaudid and morphine.   Surgery did a laparascopic cholecystectomy on 7/9. Follow-up imaging did not show any presence of choledocholelithiais. Acute on chronic cholecystitis and cholelithiasis was found on surgical pathology report following cholecystectomy. The patient did well after surgery and did not complain of any post-operative changes. Her liver enzyme levels and lipase levels trended down as well.   Diabetes mellitus type 2, controlled, without complications ([place])  Transitioned from NPO to fluid diet and subsequently to regular  Blood glucose was in normal range in hospital without any   Essential hypertension The patient's was normotensive until post-operatively she developed an aki. We held her home lisinopril as we thought it would exacerbate the aki.    AKI: Cr increased from 1.12 to 1.56 on 08/07/16 that we attributed to intra-operative blood loss that caused pre-renal aki. We  also stopped lisinopril as it can make the renal failure worse. Po  Coronary artery disease involving native coronary artery of native heart without angina pectoris There was no acute chest pain or sob during hospitalization. Aspirin given post-operatively.   Discharge Vitals:   BP (!) 125/59 (BP Location: Right Arm)   Pulse 81   Temp 98.8 F (37.1 C)   Resp 16   Ht 5\' 2"  (1.575 m)   Wt 170 lb 14.4 oz (77.5 kg)   SpO2 99%   BMI 31.26 kg/m   Pertinent Labs, Studies, and Procedures:  Lipase: 1297 (7/7)--123 (7/8)--28 (7/9)  ALT: 149 (7/7)--687 (7/8)-- 469 (7/9)-- 225 (7/11)  AST: 285 (7/7)-- 889 (7/8)-- 462 (7/9)-- 210 (7/11)  ALP: 121 (7/7)-- 164 (7/8)-- 170 (7/9)-- 136 (7/11)  Tbili: 1.2 (7/7)-- 2.2 (7/8)-- 1.6 (7/9)-- 0.8  Creatinine: 1.05 (7/7)-- 1.15 (7/8)-- 1.12 (7/9)-- 1.56 (7/10)  DG Abd Acute (7/7): no acute findings in the chest or abdomen. No abnormal calcifications identified.   US Abdomen (7/7): circumferential thickened gallbladder wall with internal striations, presence of cholelithiasis, and apparent early intrahepatic biliary ductal dilation.   DG Cholangiogram operative (7/8): no discrete filling defects within the opacified portions of the biliary system to suggest presence of choledocholithiasis  Discharge Instructions: Discharge Instructions    Call MD for:  persistant dizziness or light-headedness    Complete by:  As directed    Call MD for:  persistant nausea and vomiting    Complete by:  As directed    Call MD for:  severe uncontrolled pain    Complete by:  As directed    Call MD for:  temperature >100.4    Complete by:  As directed    Diet - low sodium heart healthy    Complete by:  As directed    Discharge instructions    Complete by:  As directed    Thank you for allowing [place] to be a part of your care. You were admitted to [facility] for abdominal pain that was caused due to acute pancreatitis. During your stay, you underwent a  laparoscopic cholecystectomy. We recommend that you resume your diet slowly. Please contact a medical professional immediately should you experience any nausea, vomiting, abdominal pain, changes in stool consistency or color, etc. Thank you!   Increase activity slowly    Complete by:  As directed       Signed: Lars Mage, MD 08/08/2016, 2:25 PM   Pager: Pager: [phone]

## 2016-08-11 NOTE — Progress Notes (Signed)
Pls send to Dr Maricela Bo

## 2016-08-15 ENCOUNTER — Encounter: Payer: Self-pay | Admitting: Internal Medicine

## 2016-08-15 ENCOUNTER — Ambulatory Visit (INDEPENDENT_AMBULATORY_CARE_PROVIDER_SITE_OTHER): Payer: Medicare Other | Admitting: Internal Medicine

## 2016-08-15 DIAGNOSIS — Z841 Family history of disorders of kidney and ureter: Secondary | ICD-10-CM

## 2016-08-15 DIAGNOSIS — Z8249 Family history of ischemic heart disease and other diseases of the circulatory system: Secondary | ICD-10-CM | POA: Diagnosis not present

## 2016-08-15 DIAGNOSIS — I10 Essential (primary) hypertension: Secondary | ICD-10-CM | POA: Diagnosis not present

## 2016-08-15 DIAGNOSIS — K851 Biliary acute pancreatitis without necrosis or infection: Secondary | ICD-10-CM | POA: Diagnosis not present

## 2016-08-15 DIAGNOSIS — N179 Acute kidney failure, unspecified: Secondary | ICD-10-CM | POA: Insufficient documentation

## 2016-08-15 NOTE — Progress Notes (Signed)
Medicine attending: Medical history, presenting problems, physical findings, and medications, reviewed with resident physician Dr Nina Blum on the day of the patient visit and I concur with her evaluation and management plan. 

## 2016-08-15 NOTE — Progress Notes (Addendum)
   CC: follow up after hospitalization for gallstone pancreatitis   HPI:  [name] is a 66 y.o. with PMH as listed below who presents for follow up after hospitalization for acute gallstone pancreatitis. Please see the assessment and plans for the status of the patient chronic medical problems.    Past Medical History:  Diagnosis Date  . Bilateral pleural effusion 02/22/2014  . Diabetes mellitus 1998   Dx in 1998. Microalbuminuria, never on insulin.  Marland Kitchen GERD 12/05/2005   Qualifier: Diagnosis of  By: Prudencio Burly MD, Phillip Heal    . GERD (gastroesophageal reflux disease)   . History of blurry vision 06/09   Hospitalized for this  . Hyperlipidemia   . Hypertension   . Personal history of colonic adenomas 10/07/2007  . Pneumonia 01/2014   Review of Systems:  Refer to history of present illness and assessment and plans for pertinent review of systems, all others reviewed and negative  Physical Exam:  Vitals:   08/15/16 0929  BP: (!) 152/71  Pulse: 81  Temp: 98.8 F (37.1 C)  TempSrc: Oral  SpO2: 100%  Weight: 175 lb 9.6 oz (79.7 kg)   Physical Exam  Constitutional: She is oriented to person, place, and time and well-developed, well-nourished, and in no distress. No distress.  HENT:  Head: Normocephalic and atraumatic.  Cardiovascular: Normal rate and regular rhythm.   No murmur heard. 1+ lower extremity pitting edema   Pulmonary/Chest: Effort normal. No respiratory distress.  Abdominal: She exhibits no distension. There is tenderness. There is no rebound and no guarding.  Tenderness to palpation over right upper quadrant. 3 1.5 cm surgical scars which are clean and dry without surrounding erythema.   Neurological: She is alert and oriented to person, place, and time.  Skin: Skin is warm and dry. She is not diaphoretic.    Assessment & Plan:   Acute gallstone pancreatitis  Patient was hospitalized 7/7-7/11 for gallstone pancreatitis. At presentation she had elevated lipase  3x upper limit of normal, elevated hepatic transaminase, and ultrasound showed gallstones and intrahepatic biliary duct dilatation. Had laparoscopic cholecystectomy with intraoperative cholangiogram 7/9. Lipase and transaminase improved post procedure but elevation in transaminase did not resolve completely. Today she is here for follow up after surgery. She reports that her abdominal pain and nausea have resolved completely however she continues to have a poor appetite. She has been sticking to a low fat diet. She has follow up scheduled with general surgery on 7/24. Clinically, it appears that her pancreatitis has resolved and she has no complications of the cholecystectomy at this time.  -CMP today to follow up elevated transaminase   ADDENDUM: CMP shows significant improvement in elevated transaminase.   Hypertension  Home med lisinopril was held in the setting of AKI which developed post cholecystectomy when crt increased at 1.56 up from baseline of 1.1. Blood pressure is not controlled today.  - CMP today to follow up crt  - RTC in 1 week if crt is elevated, 2 months if not   ADDENDUM: CMP showed resolution of AKI, I updated the patient with these results and have told her that she can resume taking lisinopril.   See Encounters Tab for problem based charting.  Patient discussed with Dr. Beryle Beams

## 2016-08-15 NOTE — Patient Instructions (Signed)
[name],   [name] glad your abdominal pain is feeling a lot better, please call and let us know if your appetite does not continue to improve.   Continue holding off on taking your lisinopril for now. I will call you with the results of your labwork and let you know if you can continue taking it.  Please schedule a follow up appointment with your primary care doctor Dr. Trilby Drummer in their continuity clinic.   These call the clinic if you need anything [phone]

## 2016-08-16 LAB — CMP14 + ANION GAP
ALBUMIN: 3.1 g/dL — AB (ref 3.6–4.8)
ALK PHOS: 164 IU/L — AB (ref 39–117)
ALT: 60 IU/L — AB (ref 0–32)
ANION GAP: 15 mmol/L (ref 10.0–18.0)
AST: 44 IU/L — ABNORMAL HIGH (ref 0–40)
Albumin/Globulin Ratio: 0.8 — ABNORMAL LOW (ref 1.2–2.2)
BUN / CREAT RATIO: 16 (ref 12–28)
BUN: 16 mg/dL (ref 8–27)
Bilirubin Total: 0.4 mg/dL (ref 0.0–1.2)
CO2: 23 mmol/L (ref 20–29)
Calcium: 8.5 mg/dL — ABNORMAL LOW (ref 8.7–10.3)
Chloride: 102 mmol/L (ref 96–106)
Creatinine, Ser: 1.02 mg/dL — ABNORMAL HIGH (ref 0.57–1.00)
GFR calc Af Amer: 66 mL/min/{1.73_m2} (ref >59)
GFR calc non Af Amer: 57 mL/min/{1.73_m2} — ABNORMAL LOW (ref >59)
Globulin, Total: 3.8 g/dL (ref 1.5–4.5)
Glucose: 191 mg/dL — ABNORMAL HIGH (ref 65–99)
Potassium: 4.8 mmol/L (ref 3.5–5.2)
Sodium: 140 mmol/L (ref 134–144)
Total Protein: 6.9 g/dL (ref 6.0–8.5)

## 2016-08-16 MED ORDER — LISINOPRIL 40 MG PO TABS
40.0000 mg | ORAL_TABLET | Freq: Every day | ORAL | 11 refills | Status: DC
Start: 2016-08-16 — End: 2017-09-17

## 2016-08-16 NOTE — Addendum Note (Signed)
Addended by: Meryl Dare on: 08/16/2016 03:56 PM   Modules accepted: Orders

## 2016-08-30 ENCOUNTER — Encounter: Payer: Self-pay | Admitting: Cardiology

## 2016-08-30 ENCOUNTER — Ambulatory Visit (INDEPENDENT_AMBULATORY_CARE_PROVIDER_SITE_OTHER): Payer: Medicare Other | Admitting: Cardiology

## 2016-08-30 VITALS — BP 110/62 | HR 84 | Ht 62.0 in | Wt 174.0 lb

## 2016-08-30 DIAGNOSIS — I255 Ischemic cardiomyopathy: Secondary | ICD-10-CM | POA: Diagnosis not present

## 2016-08-30 DIAGNOSIS — I5022 Chronic systolic (congestive) heart failure: Secondary | ICD-10-CM | POA: Diagnosis not present

## 2016-08-30 DIAGNOSIS — I2510 Atherosclerotic heart disease of native coronary artery without angina pectoris: Secondary | ICD-10-CM

## 2016-08-30 DIAGNOSIS — E7800 Pure hypercholesterolemia, unspecified: Secondary | ICD-10-CM | POA: Diagnosis not present

## 2016-08-30 DIAGNOSIS — I10 Essential (primary) hypertension: Secondary | ICD-10-CM

## 2016-08-30 DIAGNOSIS — I38 Endocarditis, valve unspecified: Secondary | ICD-10-CM

## 2016-08-30 MED ORDER — SPIRONOLACTONE 25 MG PO TABS: 25.0000 mg | ORAL_TABLET | Freq: Every day | ORAL | 3 refills | Status: DC

## 2016-08-30 NOTE — Patient Instructions (Signed)
Medication Instructions:   START SPIRONOLACTONE 25 MG ONCE DAILY  Labwork:  Your physician recommends that you return for lab work in: [place]:  Your physician wants you to follow-up in: [place] will receive a reminder letter in the mail two months in advance. If you don't receive a letter, please call our office to schedule the follow-up appointment.   If you need a refill on your cardiac medications before your next appointment, please call your pharmacy.

## 2016-08-30 NOTE — Progress Notes (Signed)
HPI: FU CAD; previously admitted with CHF; echo 1/16 showed EF 25-30, mild to moderate MR, mild LAE/RAE/RVE; moderately reduced RV function; small pericardial effusion. Carotid dopplers 1/16 showed 1-39 bilateral stenosis. Cath revealed 3 vessel CAD and EF 30-35. Had CABG with LIMA to LAD, seq svg to first and second diagonal, svg to OM, svg to PDA. Echocardiogram repeated June 2016 and showed ejection fraction 25-30%. Grade 1 diastolic dysfunction, mild mitral regurgitation. There is a density at the IVC and chest CT recommended. CT showed no mass. Patient referred to electrophysiology for consideration of ICD but patient declined. At last office visit patient had discontinued all of her medications and we resumed. Patient had acute gallstone pancreatitis in July 2018. She had laparoscopic cholecystectomy at that time. Since last seen, she denies dyspnea, chest pain, palpitations or syncope. Mild pedal edema.  Current Outpatient Prescriptions  Medication Sig Dispense Refill  . aspirin 81 MG tablet Take 1 tablet (81 mg total) by mouth daily.    Marland Kitchen atorvastatin (LIPITOR) 80 MG tablet Take 1 tablet (80 mg total) by mouth daily. 90 tablet 3  . carvedilol (COREG) 25 MG tablet Take 1 tablet (25 mg total) by mouth 2 (two) times daily with a meal. 180 tablet 3  . lisinopril (PRINIVIL,ZESTRIL) 40 MG tablet Take 1 tablet (40 mg total) by mouth daily. 30 tablet 11   No current facility-administered medications for this visit.      Past Medical History:  Diagnosis Date  . Bilateral pleural effusion 02/22/2014  . Diabetes mellitus 1998   Dx in 1998. Microalbuminuria, never on insulin.  Marland Kitchen GERD 12/05/2005   Qualifier: Diagnosis of  By: Prudencio Burly MD, Phillip Heal    . GERD (gastroesophageal reflux disease)   . History of blurry vision 06/09   Hospitalized for this  . Hyperlipidemia   . Hypertension   . Personal history of colonic adenomas 10/07/2007  . Pneumonia 01/2014    Past Surgical History:  Procedure  Laterality Date  . CHOLECYSTECTOMY N/A 08/06/2016   Procedure: LAPAROSCOPIC CHOLECYSTECTOMY WITH INTRAOPERATIVE CHOLANGIOGRAM;  Surgeon: Georganna Skeans, MD;  Location: [place];  Service: General;  Laterality: N/A;  . CORONARY ARTERY BYPASS GRAFT N/A 03/01/2014   Procedure: CORONARY ARTERY BYPASS GRAFTING (CABG) x five, using left internal mammary artery and right leg greater saphenous vein harvested endoscopically;  Surgeon: Grace Isaac, MD;  Location: [place];  Service: Open Heart Surgery;  Laterality: N/A;  . INTRAOPERATIVE TRANSESOPHAGEAL ECHOCARDIOGRAM N/A 03/01/2014   Procedure: INTRAOPERATIVE TRANSESOPHAGEAL ECHOCARDIOGRAM;  Surgeon: Grace Isaac, MD;  Location: [place];  Service: Open Heart Surgery;  Laterality: N/A;  . LEFT AND RIGHT HEART CATHETERIZATION WITH CORONARY ANGIOGRAM N/A 02/26/2014   Procedure: LEFT AND RIGHT HEART CATHETERIZATION WITH CORONARY ANGIOGRAM;  Surgeon: Sinclair Grooms, MD;  Location: [facility] CATH LAB;  Service: Cardiovascular;  Laterality: N/A;  . TOTAL ABDOMINAL HYSTERECTOMY W/ BILATERAL SALPINGOOPHORECTOMY  1996    Social History   Social History  . Marital status: Widowed    Spouse name: N/A  . Number of children: N/A  . 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 Smoking status: Never Smoker  . Smokeless tobacco: Never Used  . Alcohol use No  . Drug use: No  . Sexual activity: Not on file   Other Topics Concern  . Not on file   Social History Narrative   Married x 42 yrs.  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   Never smoked. Doesn't drink or use drugs.   Does Patient Exercise:  yes - sometimes      To get diabetes supplies for [name] RD, CDE, April 20,2011    Family History  Problem Relation Age of Onset  . Diabetes Mother        Mother died of MI at age 58  . Heart failure Mother   . Kidney failure Mother     . Heart failure Sister        Died  . Heart failure Brother        Died  . Diabetes Brother        Died    ROS: no fevers or chills, productive cough, hemoptysis, dysphasia, odynophagia, melena, hematochezia, dysuria, hematuria, rash, seizure activity, orthopnea, PND, claudication. Remaining systems are negative.  Physical Exam: Well-developed well-nourished in no acute distress.  Skin is warm and dry.  HEENT is normal.  Neck is supple.  Chest is clear to auscultation with normal expansion.  Cardiovascular exam is regular rate and rhythm.  Abdominal exam nontender or distended. No masses palpated. Extremities show 1+ edema. neuro grossly intact   A/P  1 coronary artery disease-continue aspirin and statin.  2 hypertension-blood pressure is controlled. Continue present medications.  3 hyperlipidemia-continue statin.  4 ischemic cardiomyopathy-continue ACE inhibitor and beta blocker. Patient declines ICD and understands the risk of sudden death.  5 chronic systolic congestive heart failure-patient is mildly volume overloaded on examination. I will add spironolactone 25 mg daily. Check potassium and renal function in 1 week.   6 history of noncompliance-again discussed the importance of medication compliance.  Kirk Ruths, MD

## 2016-11-22 ENCOUNTER — Ambulatory Visit (INDEPENDENT_AMBULATORY_CARE_PROVIDER_SITE_OTHER): Payer: Medicare Other | Admitting: Internal Medicine

## 2016-11-22 ENCOUNTER — Encounter (INDEPENDENT_AMBULATORY_CARE_PROVIDER_SITE_OTHER): Payer: Self-pay

## 2016-11-22 VITALS — BP 124/88 | HR 92 | Temp 98.2°F | Wt 172.5 lb

## 2016-11-22 DIAGNOSIS — I10 Essential (primary) hypertension: Secondary | ICD-10-CM | POA: Diagnosis not present

## 2016-11-22 DIAGNOSIS — Z87448 Personal history of other diseases of urinary system: Secondary | ICD-10-CM

## 2016-11-22 DIAGNOSIS — D649 Anemia, unspecified: Secondary | ICD-10-CM

## 2016-11-22 DIAGNOSIS — Z79899 Other long term (current) drug therapy: Secondary | ICD-10-CM | POA: Diagnosis not present

## 2016-11-22 DIAGNOSIS — E119 Type 2 diabetes mellitus without complications: Secondary | ICD-10-CM | POA: Diagnosis not present

## 2016-11-22 DIAGNOSIS — Z23 Encounter for immunization: Secondary | ICD-10-CM | POA: Diagnosis not present

## 2016-11-22 LAB — GLUCOSE, CAPILLARY: Glucose-Capillary: 158 mg/dL — ABNORMAL HIGH (ref 65–99)

## 2016-11-22 LAB — POCT GLYCOSYLATED HEMOGLOBIN (HGB A1C): HEMOGLOBIN A1C: 6.8

## 2016-11-22 NOTE — Progress Notes (Signed)
   CC: Hypertension, Diabetes, Anemia  HPI:  [name] is a 66 y.o. F with PMHx listed below presenting for Hypertension, Diabetes and Anemia. Please see the A&P for the status of the patient's chronic medical problems.  Patient has concerns for anemia. She was noted to be anemic during prior hospitalization (last Hgb 7.5) and she think this could be contributing to her feeling cold most of the time. Will order follow up CBC today to evaluate for resolution or persistence of anemia. Patient denies weakness, lightheadedness, or decreased energy.  Past Medical History:  Diagnosis Date  . Bilateral pleural effusion 02/22/2014  . Diabetes mellitus 1998   Dx in 1998. Microalbuminuria, never on insulin.  Marland Kitchen GERD 12/05/2005   Qualifier: Diagnosis of  By: Prudencio Burly MD, Phillip Heal    . GERD (gastroesophageal reflux disease)   . History of blurry vision 06/09   Hospitalized for this  . Hyperlipidemia   . Hypertension   . Personal history of colonic adenomas 10/07/2007  . Pneumonia 01/2014   Review of Systems:  Performed and negative except as otherwise indicated.  Physical Exam:  Vitals:   11/22/16 1338  BP: 124/88  Pulse: 92  Temp: 98.2 F (36.8 C)  TempSrc: Oral  SpO2: 100%  Weight: 172 lb 8 oz (78.2 kg)   Physical Exam  Constitutional: She is oriented to person, place, and time. She appears well-developed and well-nourished.  HENT:  Head: Normocephalic and atraumatic.  Eyes: EOM are normal. Right eye exhibits no discharge. Left eye exhibits no discharge.  Cardiovascular: Normal rate, regular rhythm and normal heart sounds.   Pulmonary/Chest: Effort normal and breath sounds normal. No respiratory distress.  Abdominal: Soft. Bowel sounds are normal. She exhibits no distension. There is no tenderness.  Musculoskeletal: She exhibits no deformity.  Bilateral LE Edema L>R  Neurological: She is alert and oriented to person, place, and time.  Skin: Skin is warm and dry.  Psychiatric:  She has a normal mood and affect.    Assessment & Plan:   See Encounters Tab for problem based charting.  Patient seen with Dr. Eppie Gibson

## 2016-11-22 NOTE — Patient Instructions (Addendum)
Thank you for allowing [place] to car for you  Your blood pressure remains under control - Continue current medications  Your Hemoglobin A1C (a measure of your blood sugar) was up a little from last time - Continue to work on controlling your sugars with your diet  We are performing a lab test to evaluate you for anemia. - You will be called if results are abnormal and mailed a letter for normal results.  You also received your flu shot today  Please return to clinic in 3 months for follow up

## 2016-11-22 NOTE — Assessment & Plan Note (Signed)
Patient received Flu vaccination today

## 2016-11-22 NOTE — Assessment & Plan Note (Addendum)
BP remains controlled, 124/88 today. On Lisinopril 40mg  daily and Coreg 25mg  BID. Last BMET on 7/18 showed resolution of AKI from recent hospitalization with BUN 16 and Cr 1.02 (on 7/18). - Lisinopril 40mg  daily - Coreg 25mg  BID. - Return to clinic in 3 months for follow up

## 2016-11-22 NOTE — Assessment & Plan Note (Addendum)
Last HgbA1c 6.2 on 06/01/16, which was improved from 7.3. Patient was on diet control only. - A1C Today 6.8, up from last visit, but still <7 - Patient encourage to keep working to control her blood sugar with diet

## 2016-11-22 NOTE — Assessment & Plan Note (Addendum)
Patient was noted to be anemic during prior hospitalization (last Hgb 7.5) and she think this could be contributing to her feeling cold most of the time. Will order follow up CBC today to evaluate for resolution or persistence of anemia. Patient denies weakness, lightheadedness, or decreased energy. - CBC  ADDENDUM: Hgb improved to 9.7, patient notified by letter

## 2016-11-23 LAB — CBC
HEMATOCRIT: 30.5 % — AB (ref 34.0–46.6)
Hemoglobin: 9.7 g/dL — ABNORMAL LOW (ref 11.1–15.9)
MCH: 27.2 pg (ref 26.6–33.0)
MCHC: 31.8 g/dL (ref 31.5–35.7)
MCV: 86 fL (ref 79–97)
PLATELETS: 185 10*3/uL (ref 150–379)
RBC: 3.56 x10E6/uL — ABNORMAL LOW (ref 3.77–5.28)
RDW: 16.7 % — AB (ref 12.3–15.4)
WBC: 7 10*3/uL (ref 3.4–10.8)

## 2016-12-03 NOTE — Progress Notes (Signed)
I saw and evaluated the patient.  I personally confirmed the key portions of Dr. Tally Joe history and exam and reviewed pertinent patient test results.  The assessment, diagnosis, and plan were formulated together and I agree with the documentation in the resident's note.

## 2016-12-06 ENCOUNTER — Encounter: Payer: Self-pay | Admitting: Internal Medicine

## 2016-12-06 NOTE — Progress Notes (Signed)
Patient's hemoglobin, while low at 9.7, is much improved from previous of 7.5 and and close to her baseline. Patient mailed letter with result and explanation.  Pearson Grippe, DO IM PGY-1

## 2017-02-21 ENCOUNTER — Encounter: Payer: Medicare Other | Admitting: Internal Medicine

## 2017-03-04 NOTE — Progress Notes (Signed)
HPI: FU CAD; previously admitted with CHF; echo 1/16 showed EF 25-30, mild to moderate MR, mild LAE/RAE/RVE; moderately reduced RV function; small pericardial effusion. Carotid dopplers 1/16 showed 1-39 bilateral stenosis. Cath revealed 3 vessel CAD and EF 30-35. Had CABG with LIMA to LAD, seq svg to first and second diagonal, svg to OM, svg to PDA. Echocardiogram repeated June 2016 and showed ejection fraction 25-30%. Grade 1 diastolic dysfunction, mild mitral regurgitation. There is a density at the IVC and chest CT recommended. CT showed no mass. Patient referred to electrophysiology for consideration of ICD but patient declined. At last office visit patient had discontinued all of her medications and we resumed. Patient had acute gallstone pancreatitis in July 2018. She had laparoscopic cholecystectomy at that time. Since last seen, she has dyspnea with more vigorous activities but not routine activities.  Mild pedal edema.  No chest pain or syncope.  Current Outpatient Medications  Medication Sig Dispense Refill  . aspirin 81 MG tablet Take 1 tablet (81 mg total) by mouth daily.    Marland Kitchen atorvastatin (LIPITOR) 80 MG tablet Take 1 tablet (80 mg total) by mouth daily. 90 tablet 3  . carvedilol (COREG) 25 MG tablet Take 1 tablet (25 mg total) by mouth 2 (two) times daily with a meal. 180 tablet 3  . lisinopril (PRINIVIL,ZESTRIL) 40 MG tablet Take 1 tablet (40 mg total) by mouth daily. 30 tablet 11  . spironolactone (ALDACTONE) 25 MG tablet Take 1 tablet (25 mg total) by mouth daily. 90 tablet 3   No current facility-administered medications for this visit.      Past Medical History:  Diagnosis Date  . Bilateral pleural effusion 02/22/2014  . Diabetes mellitus 1998   Dx in 1998. Microalbuminuria, never on insulin.  Marland Kitchen GERD 12/05/2005   Qualifier: Diagnosis of  By: Prudencio Burly MD, Phillip Heal    . GERD (gastroesophageal reflux disease)   . History of blurry vision 06/09   Hospitalized for this  .  Hyperlipidemia   . Hypertension   . Personal history of colonic adenomas 10/07/2007  . Pneumonia 01/2014    Past Surgical History:  Procedure Laterality Date  . CHOLECYSTECTOMY N/A 08/06/2016   Procedure: LAPAROSCOPIC CHOLECYSTECTOMY WITH INTRAOPERATIVE CHOLANGIOGRAM;  Surgeon: Georganna Skeans, MD;  Location: [place];  Service: General;  Laterality: N/A;  . CORONARY ARTERY BYPASS GRAFT N/A 03/01/2014   Procedure: CORONARY ARTERY BYPASS GRAFTING (CABG) x five, using left internal mammary artery and right leg greater saphenous vein harvested endoscopically;  Surgeon: Grace Isaac, MD;  Location: [place];  Service: Open Heart Surgery;  Laterality: N/A;  . INTRAOPERATIVE TRANSESOPHAGEAL ECHOCARDIOGRAM N/A 03/01/2014   Procedure: INTRAOPERATIVE TRANSESOPHAGEAL ECHOCARDIOGRAM;  Surgeon: Grace Isaac, MD;  Location: [place];  Service: Open Heart Surgery;  Laterality: N/A;  . LEFT AND RIGHT HEART CATHETERIZATION WITH CORONARY ANGIOGRAM N/A 02/26/2014   Procedure: LEFT AND RIGHT HEART CATHETERIZATION WITH CORONARY ANGIOGRAM;  Surgeon: Sinclair Grooms, MD;  Location: [facility] CATH LAB;  Service: Cardiovascular;  Laterality: N/A;  . TOTAL ABDOMINAL HYSTERECTOMY W/ BILATERAL SALPINGOOPHORECTOMY  1996    Social History   Socioeconomic History  . Marital status: Widowed    Spouse name: Not on file  . Number of children: Not on file  . Years of education: Not on file  . Highest education level: Not on file  Social Needs  . Financial resource strain: Not on file  . Food insecurity - worry: Not on file  . Food  insecurity - inability: Not on file  . Transportation needs - medical: Not on file  . Transportation needs - non-medical: Not on file  Occupational History  . Occupation: Automotive engineer  Tobacco Use  . Smoking status: Never Smoker  . Smokeless tobacco: Never Used  Substance and Sexual Activity  . Alcohol use: No    Alcohol/week: 0.0 oz  . Drug use: No  . Sexual activity: Not on file    Other Topics Concern  . Not on file  Social History Narrative   Married x 42 yrs.  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   Never smoked. Doesn't drink or use drugs.   Does Patient Exercise:  yes - sometimes      To get diabetes supplies for [name] RD, CDE, April 20,2011    Family History  Problem Relation Age of Onset  . Diabetes Mother        Mother died of MI at age 40  . Heart failure Mother   . Kidney failure Mother   . Heart failure Sister        Died  . Heart failure Brother        Died  . Diabetes Brother        Died    ROS: no fevers or chills, productive cough, hemoptysis, dysphasia, odynophagia, melena, hematochezia, dysuria, hematuria, rash, seizure activity, orthopnea, PND, claudication. Remaining systems are negative.  Physical Exam: Well-developed obese in no acute distress.  Skin is warm and dry.  HEENT is normal.  Neck is supple.  Chest is clear to auscultation with normal expansion.  Cardiovascular exam is regular rate and rhythm.  Abdominal exam nontender or distended. No masses palpated. Extremities show trace edema. neuro grossly intact   A/P  1 coronary artery disease-patient doing well with no chest pain.  Continue aspirin and statin.  2 hypertension-blood pressure is controlled.  Continue present medications.  Check potassium and renal function.  3 hyperlipidemia-continue statin.  Check lipids and liver.  4 ischemic cardiomyopathy-continue ACE inhibitor and beta-blocker.  She declines ICD and understands the risk of sudden death.  5 chronic systolic congestive heart failure-continue present dose of diuretic.  Check potassium and renal function.  Patient instructed on low-sodium diet and fluid restriction.  Kirk Ruths, MD

## 2017-03-08 ENCOUNTER — Ambulatory Visit (INDEPENDENT_AMBULATORY_CARE_PROVIDER_SITE_OTHER): Payer: Medicare Other | Admitting: Cardiology

## 2017-03-08 ENCOUNTER — Encounter: Payer: Self-pay | Admitting: Cardiology

## 2017-03-08 VITALS — BP 108/80 | Ht 62.0 in | Wt 180.0 lb

## 2017-03-08 DIAGNOSIS — E7800 Pure hypercholesterolemia, unspecified: Secondary | ICD-10-CM | POA: Diagnosis not present

## 2017-03-08 DIAGNOSIS — I5022 Chronic systolic (congestive) heart failure: Secondary | ICD-10-CM | POA: Diagnosis not present

## 2017-03-08 DIAGNOSIS — I10 Essential (primary) hypertension: Secondary | ICD-10-CM | POA: Diagnosis not present

## 2017-03-08 DIAGNOSIS — I2510 Atherosclerotic heart disease of native coronary artery without angina pectoris: Secondary | ICD-10-CM

## 2017-03-08 DIAGNOSIS — I255 Ischemic cardiomyopathy: Secondary | ICD-10-CM

## 2017-03-08 NOTE — Patient Instructions (Signed)

## 2017-03-09 LAB — COMPREHENSIVE METABOLIC PANEL
ALBUMIN: 3.8 g/dL (ref 3.6–4.8)
ALT: 24 IU/L (ref 0–32)
AST: 30 IU/L (ref 0–40)
Albumin/Globulin Ratio: 1.2 (ref 1.2–2.2)
Alkaline Phosphatase: 104 IU/L (ref 39–117)
BUN / CREAT RATIO: 20 (ref 12–28)
BUN: 25 mg/dL (ref 8–27)
CO2: 22 mmol/L (ref 20–29)
Calcium: 9 mg/dL (ref 8.7–10.3)
Chloride: 103 mmol/L (ref 96–106)
Creatinine, Ser: 1.26 mg/dL — ABNORMAL HIGH (ref 0.57–1.00)
GFR calc Af Amer: 51 mL/min/{1.73_m2} — ABNORMAL LOW (ref >59)
GFR calc non Af Amer: 45 mL/min/{1.73_m2} — ABNORMAL LOW (ref >59)
GLUCOSE: 152 mg/dL — AB (ref 65–99)
Globulin, Total: 3.2 g/dL (ref 1.5–4.5)
POTASSIUM: 4.9 mmol/L (ref 3.5–5.2)
Sodium: 141 mmol/L (ref 134–144)
Total Protein: 7 g/dL (ref 6.0–8.5)

## 2017-03-09 LAB — LIPID PANEL
CHOL/HDL RATIO: 2.6 ratio (ref 0.0–4.4)
Cholesterol, Total: 176 mg/dL (ref 100–199)
HDL: 68 mg/dL (ref >39)
LDL Calculated: 93 mg/dL (ref 0–99)
Triglycerides: 76 mg/dL (ref 0–149)
VLDL Cholesterol Cal: 15 mg/dL (ref 5–40)

## 2017-03-11 ENCOUNTER — Encounter: Payer: Self-pay | Admitting: *Deleted

## 2017-03-21 ENCOUNTER — Ambulatory Visit (INDEPENDENT_AMBULATORY_CARE_PROVIDER_SITE_OTHER): Payer: Medicare Other | Admitting: Internal Medicine

## 2017-03-21 ENCOUNTER — Encounter: Payer: Self-pay | Admitting: Internal Medicine

## 2017-03-21 VITALS — BP 138/68 | HR 84 | Temp 99.9°F | Wt 180.0 lb

## 2017-03-21 DIAGNOSIS — I10 Essential (primary) hypertension: Secondary | ICD-10-CM

## 2017-03-21 DIAGNOSIS — Z79899 Other long term (current) drug therapy: Secondary | ICD-10-CM | POA: Diagnosis not present

## 2017-03-21 DIAGNOSIS — J069 Acute upper respiratory infection, unspecified: Secondary | ICD-10-CM | POA: Diagnosis not present

## 2017-03-21 DIAGNOSIS — R05 Cough: Secondary | ICD-10-CM

## 2017-03-21 DIAGNOSIS — Z0000 Encounter for general adult medical examination without abnormal findings: Secondary | ICD-10-CM

## 2017-03-21 DIAGNOSIS — E119 Type 2 diabetes mellitus without complications: Secondary | ICD-10-CM

## 2017-03-21 DIAGNOSIS — B9789 Other viral agents as the cause of diseases classified elsewhere: Secondary | ICD-10-CM

## 2017-03-21 HISTORY — DX: Acute upper respiratory infection, unspecified: J06.9

## 2017-03-21 LAB — GLUCOSE, CAPILLARY: GLUCOSE-CAPILLARY: 169 mg/dL — AB (ref 65–99)

## 2017-03-21 LAB — POCT GLYCOSYLATED HEMOGLOBIN (HGB A1C): Hemoglobin A1C: 6.7

## 2017-03-21 NOTE — Assessment & Plan Note (Signed)
BP remains controlled, 138/68 on first check today (no recheck today). BP last visit 124/88. She continue to tolerate her medications well.  - Continue Lisinopril 40mg  Daily - Continue Coreg 25mg  BID. - Return to clinic in 3 months for follow up

## 2017-03-21 NOTE — Assessment & Plan Note (Signed)
Hgb A1c 6.7 today. Last HgbA1c 6.8. Patient remains on diet control only and her A1c has been as low as 6.2 using this method. She states she was not as good about her diet over the holidays. She wound like to continue to control her diabetes with diet alone. She was encourage to continue diet control, but we also discussed the posibility of starting an oral agent if she is unable to bring her A1c back down. - Patient encouraged to keep working to control her blood sugar with diet - Will discuss starting low dose metformin if A1c increased at next visit - Foot Exam today - Patient has eye doctor and will schedule a diabetic eye exam

## 2017-03-21 NOTE — Assessment & Plan Note (Signed)
Patient reports 4-5 days of coughing, sneezing, and rhinorrhea. She states that she has had a sick contact of her grandson son who had similar symptoms. She denies any fever, or shortness of breath. She has not had any worsening of symptoms but they have not started to ease off either. She has had her flu shot this year. She has not tried anything for her symptoms thus far. Lungs clear on exam. Patients symptoms consistent with viral URI with cough. - Advised supportive care as needed with low strength OTC cold medicines that she has found help her in the past - Recommended nasal rinse if congestion becomes a problem

## 2017-03-21 NOTE — Assessment & Plan Note (Signed)
Patient referred for screening mammography

## 2017-03-21 NOTE — Progress Notes (Signed)
   CC: Hypertension, Diabetes, Cough  HPI:  [name] is a 67 y.o. F with PMHx listed below presenting for Hypertension, Diabetes, and Cough. Please see the A&P for the status of the patient's chronic medical problems.  Patient reports 4-5 days of coughing, sneezing, and rhinorrhea. She states that she has had a sick contact of her grandson son who had similar symptoms. She denies any fever, or shortness of breath. She has not had any worsening of symptoms but they have not started to ease off either. She has had her flu shot this year. She has not tried anything for her symptoms thus far.  Past Medical History:  Diagnosis Date  . Bilateral pleural effusion 02/22/2014  . Diabetes mellitus 1998   Dx in 1998. Microalbuminuria, never on insulin.  Marland Kitchen GERD 12/05/2005   Qualifier: Diagnosis of  By: Prudencio Burly MD, Phillip Silva    . GERD (gastroesophageal reflux disease)   . History of blurry vision 06/09   Hospitalized for this  . Hyperlipidemia   . Hypertension   . Personal history of colonic adenomas 10/07/2007  . Pneumonia 01/2014   Review of Systems: Performed and all others negative.  Physical Exam:  Vitals:   03/21/17 1353  BP: 138/68  Pulse: 84  Temp: 99.9 F (37.7 C)  TempSrc: Oral  SpO2: 100%  Weight: 180 lb (81.6 kg)   Physical Exam  Constitutional: She appears well-developed and well-nourished.  HENT:  Mouth/Throat: Oropharynx is clear and moist.  Cardiovascular: Normal rate, regular rhythm, normal heart sounds and intact distal pulses.  Pulmonary/Chest: Effort normal and breath sounds normal.  Abdominal: Soft. Bowel sounds are normal. She exhibits no distension. There is no tenderness.  Musculoskeletal:  1+ LLE edema (chronic)  Neurological: She is alert.  Skin: Skin is warm and dry.    Assessment & Plan:   See Encounters Tab for problem based charting.  Patient discussed with Dr. Daryll Silva

## 2017-03-21 NOTE — Patient Instructions (Signed)
Thank you for allowing [place] to care for you  For your Diabetes: - Your A1C was elevated today at 6.7 (up from 6.2 at last visit) - Continue to work on diet control to keep your sugars down - We will discuss adding medications at your next visit if your A1C continues to rise - Schedule an appointment with your eye doctor for a diabetic eye exam.  For your high blood pressure: - Blood pressure remains well controlled today - Continue to take Lisinopril and Coreg  For your cold: - You can take cold medications that you find effective for you - Consider nasal rinses if you are having trouble with congestion  We have ordered a referral for a screening mammogram

## 2017-03-25 NOTE — Progress Notes (Signed)
Internal Medicine Clinic Attending  Case discussed with Dr. Melvin  at the time of the visit.  We reviewed the resident's history and exam and pertinent patient test results.  I agree with the assessment, diagnosis, and plan of care documented in the resident's note.  

## 2017-05-06 ENCOUNTER — Ambulatory Visit
Admission: RE | Admit: 2017-05-06 | Discharge: 2017-05-06 | Disposition: A | Discharge status: Home / Self Care | Payer: Medicare Other | Source: Ambulatory Visit | Attending: Internal Medicine | Admitting: Internal Medicine

## 2017-05-06 DIAGNOSIS — Z0000 Encounter for general adult medical examination without abnormal findings: Secondary | ICD-10-CM

## 2017-06-19 ENCOUNTER — Other Ambulatory Visit: Payer: Self-pay | Admitting: Cardiology

## 2017-06-19 DIAGNOSIS — I10 Essential (primary) hypertension: Secondary | ICD-10-CM

## 2017-06-19 DIAGNOSIS — E785 Hyperlipidemia, unspecified: Secondary | ICD-10-CM

## 2017-06-19 NOTE — Telephone Encounter (Signed)
REFILL 

## 2017-06-20 ENCOUNTER — Encounter: Payer: Self-pay | Admitting: Internal Medicine

## 2017-06-20 ENCOUNTER — Ambulatory Visit (INDEPENDENT_AMBULATORY_CARE_PROVIDER_SITE_OTHER): Payer: Medicare Other | Admitting: Internal Medicine

## 2017-06-20 ENCOUNTER — Other Ambulatory Visit: Payer: Self-pay

## 2017-06-20 VITALS — BP 129/55 | HR 81 | Temp 99.1°F | Wt 186.0 lb

## 2017-06-20 DIAGNOSIS — Z7982 Long term (current) use of aspirin: Secondary | ICD-10-CM | POA: Diagnosis not present

## 2017-06-20 DIAGNOSIS — I5022 Chronic systolic (congestive) heart failure: Secondary | ICD-10-CM

## 2017-06-20 DIAGNOSIS — I110 Hypertensive heart disease with heart failure: Secondary | ICD-10-CM

## 2017-06-20 DIAGNOSIS — Z79899 Other long term (current) drug therapy: Secondary | ICD-10-CM | POA: Diagnosis not present

## 2017-06-20 DIAGNOSIS — Z7984 Long term (current) use of oral hypoglycemic drugs: Secondary | ICD-10-CM

## 2017-06-20 DIAGNOSIS — E119 Type 2 diabetes mellitus without complications: Secondary | ICD-10-CM | POA: Diagnosis not present

## 2017-06-20 DIAGNOSIS — I10 Essential (primary) hypertension: Secondary | ICD-10-CM

## 2017-06-20 LAB — POCT GLYCOSYLATED HEMOGLOBIN (HGB A1C): Hemoglobin A1C: 7 % — AB (ref 4.0–5.6)

## 2017-06-20 LAB — GLUCOSE, CAPILLARY: Glucose-Capillary: 115 mg/dL — ABNORMAL HIGH (ref 65–99)

## 2017-06-20 MED ORDER — METFORMIN HCL 500 MG PO TABS: 500.0000 mg | ORAL_TABLET | Freq: Two times a day (BID) | ORAL | 3 refills | Status: DC

## 2017-06-20 NOTE — Assessment & Plan Note (Signed)
Her cardiologist added spironolactone 25mg  Daily to her regimen for mild volume overload in the setting of CHF. She has also declined AICD, which was offered due to her EF being 20-25%. She understands the risk of sudden death. - Continue  Lisinopril 40mg  Daily, Coreg 25mg BID, Spironolactone 25mg  Daily, and ASA 81mg  Daily

## 2017-06-20 NOTE — Progress Notes (Signed)
   CC: Hypertension, Diabetes, Heart Failure  HPI:  [name] is a 67 y.o. F with PMHx listed below presenting for Hypertension, Diabetes, Heart Failure. Please see the A&P for the status of the patient's chronic medical problems.   Past Medical History:  Diagnosis Date  . Bilateral pleural effusion 02/22/2014  . Diabetes mellitus 1998   Dx in 1998. Microalbuminuria, never on insulin.  Marland Kitchen GERD 12/05/2005   Qualifier: Diagnosis of  By: Prudencio Burly MD, Phillip Heal    . GERD (gastroesophageal reflux disease)   . History of blurry vision 06/09   Hospitalized for this  . Hyperlipidemia   . Hypertension   . Personal history of colonic adenomas 10/07/2007  . Pneumonia 01/2014   Review of Systems: Performed and all others negative.  Physical Exam:  Vitals:   06/20/17 1311  BP: (!) 129/55  Pulse: 81  Temp: 99.1 F (37.3 C)  TempSrc: Oral  SpO2: 100%  Weight: 186 lb (84.4 kg)   Physical Exam  Constitutional: She appears well-developed and well-nourished. No distress.  Cardiovascular: Normal rate, regular rhythm, normal heart sounds and intact distal pulses.  Pulmonary/Chest: Effort normal and breath sounds normal. No respiratory distress.  Abdominal: Soft. Bowel sounds are normal. She exhibits no distension. There is no tenderness.  Musculoskeletal:  Moderate edema to the knee bilaterally, Left greater than Right  Skin: Skin is warm and dry.    Assessment & Plan:   See Encounters Tab for problem based charting.  Patient discussed with Dr. Beryle Beams

## 2017-06-20 NOTE — Assessment & Plan Note (Deleted)
Hgb A1c --- today. LastHgbA1c6.7.Patient remains ondiet control only and her A1c has been as low as 6.2 using this method. Encouraged to keep working to control her blood sugar with diet at last visit and discussed starting medication if this does not work. - ------------------------

## 2017-06-20 NOTE — Assessment & Plan Note (Addendum)
Hgb A1c 7.0 Today. Last A1c was 6.7. She has remained on diet control only. She was encourage to keep up her diet control, but  it is also time to start a medication as her A1c has continued to rise. - Start Metformin 500mg  BID, counseled on side effects to look out for - Continue lifestyle modifications - Follow up in 3 months

## 2017-06-20 NOTE — Patient Instructions (Addendum)
Thank you for allowing [place] to car for you  Your Blood pressur eremains well controlled on current medications. Good Job!  For you Diabetes, You A1c today was 7.0 which is up from last time. We will start medication today. - Take Metformin 500mg  Twice a day  For your heart failure, continue to take your medications as instructed and let us or your cardiologist know if you have leg swelling that does not resolves with elevation or shortness of breath.  Please follow up in 3 months

## 2017-06-20 NOTE — Assessment & Plan Note (Signed)
BP remains controlled, 129/55 today. BP last visit 138/68. She continues to tolerate her medications well.Her cardiologist added spironolactone 25mg  Daily to her regimen for mild volume overload in the setting of CHF. - Continue Lisinopril 40mg  Daily - Continue Coreg 25mg BID - Spironolactone 25mg  Daily - Return to clinic in3 monthsfor follow up

## 2017-06-21 NOTE — Progress Notes (Signed)
Medicine attending: Medical history, presenting problems, physical findings, and medications, reviewed with resident physician Dr Gwenette Greet on the day of the patient visit and I concur with his evaluation and management plan. Minimal increase in HbA1c within standard error of test. We will continue current regimen. Re-eval for dose increas of glucophage at next data point.

## 2017-08-26 ENCOUNTER — Other Ambulatory Visit: Payer: Self-pay | Admitting: Internal Medicine

## 2017-08-26 DIAGNOSIS — E119 Type 2 diabetes mellitus without complications: Secondary | ICD-10-CM

## 2017-08-27 NOTE — Telephone Encounter (Signed)
Refill of Metformin Approved

## 2017-09-12 ENCOUNTER — Emergency Department (HOSPITAL_COMMUNITY): Payer: Medicare Other

## 2017-09-12 ENCOUNTER — Other Ambulatory Visit: Payer: Self-pay

## 2017-09-12 ENCOUNTER — Telehealth: Payer: Self-pay | Admitting: *Deleted

## 2017-09-12 ENCOUNTER — Encounter (HOSPITAL_COMMUNITY): Payer: Self-pay | Admitting: Emergency Medicine

## 2017-09-12 ENCOUNTER — Ambulatory Visit: Payer: Medicare Other

## 2017-09-12 ENCOUNTER — Inpatient Hospital Stay (HOSPITAL_COMMUNITY)
Admission: EM | Admit: 2017-09-12 | Discharge: 2017-09-17 | DRG: 292 | Disposition: A | Discharge status: Home / Self Care | Payer: Medicare Other | Attending: Internal Medicine | Admitting: Internal Medicine

## 2017-09-12 DIAGNOSIS — I5023 Acute on chronic systolic (congestive) heart failure: Secondary | ICD-10-CM | POA: Diagnosis not present

## 2017-09-12 DIAGNOSIS — K761 Chronic passive congestion of liver: Secondary | ICD-10-CM | POA: Diagnosis present

## 2017-09-12 DIAGNOSIS — Z833 Family history of diabetes mellitus: Secondary | ICD-10-CM

## 2017-09-12 DIAGNOSIS — Z8249 Family history of ischemic heart disease and other diseases of the circulatory system: Secondary | ICD-10-CM

## 2017-09-12 DIAGNOSIS — I2510 Atherosclerotic heart disease of native coronary artery without angina pectoris: Secondary | ICD-10-CM | POA: Diagnosis present

## 2017-09-12 DIAGNOSIS — I5043 Acute on chronic combined systolic (congestive) and diastolic (congestive) heart failure: Secondary | ICD-10-CM | POA: Diagnosis not present

## 2017-09-12 DIAGNOSIS — Z841 Family history of disorders of kidney and ureter: Secondary | ICD-10-CM | POA: Diagnosis not present

## 2017-09-12 DIAGNOSIS — E785 Hyperlipidemia, unspecified: Secondary | ICD-10-CM | POA: Diagnosis present

## 2017-09-12 DIAGNOSIS — Z955 Presence of coronary angioplasty implant and graft: Secondary | ICD-10-CM

## 2017-09-12 DIAGNOSIS — I110 Hypertensive heart disease with heart failure: Principal | ICD-10-CM | POA: Diagnosis present

## 2017-09-12 DIAGNOSIS — Z7982 Long term (current) use of aspirin: Secondary | ICD-10-CM | POA: Diagnosis not present

## 2017-09-12 DIAGNOSIS — Z9049 Acquired absence of other specified parts of digestive tract: Secondary | ICD-10-CM | POA: Diagnosis not present

## 2017-09-12 DIAGNOSIS — Z951 Presence of aortocoronary bypass graft: Secondary | ICD-10-CM | POA: Diagnosis not present

## 2017-09-12 DIAGNOSIS — K219 Gastro-esophageal reflux disease without esophagitis: Secondary | ICD-10-CM | POA: Diagnosis present

## 2017-09-12 DIAGNOSIS — Z90710 Acquired absence of both cervix and uterus: Secondary | ICD-10-CM | POA: Diagnosis not present

## 2017-09-12 DIAGNOSIS — Z794 Long term (current) use of insulin: Secondary | ICD-10-CM

## 2017-09-12 DIAGNOSIS — I255 Ischemic cardiomyopathy: Secondary | ICD-10-CM | POA: Diagnosis present

## 2017-09-12 DIAGNOSIS — Z79899 Other long term (current) drug therapy: Secondary | ICD-10-CM | POA: Diagnosis not present

## 2017-09-12 DIAGNOSIS — E873 Alkalosis: Secondary | ICD-10-CM | POA: Diagnosis present

## 2017-09-12 DIAGNOSIS — I4581 Long QT syndrome: Secondary | ICD-10-CM | POA: Diagnosis not present

## 2017-09-12 DIAGNOSIS — R0600 Dyspnea, unspecified: Secondary | ICD-10-CM | POA: Diagnosis present

## 2017-09-12 DIAGNOSIS — I509 Heart failure, unspecified: Secondary | ICD-10-CM

## 2017-09-12 DIAGNOSIS — Z7984 Long term (current) use of oral hypoglycemic drugs: Secondary | ICD-10-CM

## 2017-09-12 DIAGNOSIS — I447 Left bundle-branch block, unspecified: Secondary | ICD-10-CM | POA: Diagnosis present

## 2017-09-12 DIAGNOSIS — I361 Nonrheumatic tricuspid (valve) insufficiency: Secondary | ICD-10-CM | POA: Diagnosis not present

## 2017-09-12 DIAGNOSIS — R740 Nonspecific elevation of levels of transaminase and lactic acid dehydrogenase [LDH]: Secondary | ICD-10-CM | POA: Diagnosis not present

## 2017-09-12 DIAGNOSIS — E119 Type 2 diabetes mellitus without complications: Secondary | ICD-10-CM | POA: Diagnosis present

## 2017-09-12 HISTORY — DX: Biliary acute pancreatitis without necrosis or infection: K85.10

## 2017-09-12 HISTORY — DX: Heart failure, unspecified: I50.9

## 2017-09-12 HISTORY — DX: Ischemic cardiomyopathy: I25.5

## 2017-09-12 HISTORY — DX: Dyspnea, unspecified: R06.00

## 2017-09-12 HISTORY — DX: Essential (primary) hypertension: I10

## 2017-09-12 HISTORY — DX: Presence of aortocoronary bypass graft: Z95.1

## 2017-09-12 HISTORY — DX: Atherosclerotic heart disease of native coronary artery without angina pectoris: I25.10

## 2017-09-12 HISTORY — DX: Type 2 diabetes mellitus without complications: E11.9

## 2017-09-12 HISTORY — DX: Chronic systolic (congestive) heart failure: I50.22

## 2017-09-12 LAB — CBC WITH DIFFERENTIAL/PLATELET
Abs Immature Granulocytes: 0 10*3/uL (ref 0.0–0.1)
Basophils Absolute: 0 10*3/uL (ref 0.0–0.1)
Basophils Relative: 1 %
EOS ABS: 0.1 10*3/uL (ref 0.0–0.7)
EOS PCT: 2 %
HEMATOCRIT: 30.8 % — AB (ref 36.0–46.0)
HEMOGLOBIN: 9.8 g/dL — AB (ref 12.0–15.0)
Immature Granulocytes: 0 %
LYMPHS ABS: 2.5 10*3/uL (ref 0.7–4.0)
LYMPHS PCT: 42 %
MCH: 28.1 pg (ref 26.0–34.0)
MCHC: 31.8 g/dL (ref 30.0–36.0)
MCV: 88.3 fL (ref 78.0–100.0)
MONO ABS: 0.3 10*3/uL (ref 0.1–1.0)
Monocytes Relative: 5 %
Neutro Abs: 3 10*3/uL (ref 1.7–7.7)
Neutrophils Relative %: 50 %
Platelets: 177 10*3/uL (ref 150–400)
RBC: 3.49 MIL/uL — AB (ref 3.87–5.11)
RDW: 15.3 % (ref 11.5–15.5)
WBC: 5.9 10*3/uL (ref 4.0–10.5)

## 2017-09-12 LAB — I-STAT TROPONIN, ED: TROPONIN I, POC: 0.03 ng/mL (ref 0.00–0.08)

## 2017-09-12 LAB — COMPREHENSIVE METABOLIC PANEL
ALBUMIN: 3.3 g/dL — AB (ref 3.5–5.0)
ALK PHOS: 99 U/L (ref 38–126)
ALT: 110 U/L — ABNORMAL HIGH (ref 0–44)
ANION GAP: 7 (ref 5–15)
AST: 88 U/L — ABNORMAL HIGH (ref 15–41)
BILIRUBIN TOTAL: 0.6 mg/dL (ref 0.3–1.2)
BUN: 15 mg/dL (ref 8–23)
CALCIUM: 9 mg/dL (ref 8.9–10.3)
CO2: 22 mmol/L (ref 22–32)
Chloride: 112 mmol/L — ABNORMAL HIGH (ref 98–111)
Creatinine, Ser: 1.12 mg/dL — ABNORMAL HIGH (ref 0.44–1.00)
GFR, EST AFRICAN AMERICAN: 58 mL/min — AB (ref >60)
GFR, EST NON AFRICAN AMERICAN: 50 mL/min — AB (ref >60)
Glucose, Bld: 83 mg/dL (ref 70–99)
POTASSIUM: 4.2 mmol/L (ref 3.5–5.1)
Sodium: 141 mmol/L (ref 135–145)
TOTAL PROTEIN: 6.8 g/dL (ref 6.5–8.1)

## 2017-09-12 LAB — BRAIN NATRIURETIC PEPTIDE: B NATRIURETIC PEPTIDE 5: 2171.3 pg/mL — AB (ref 0.0–100.0)

## 2017-09-12 MED ORDER — CARVEDILOL 25 MG PO TABS
25.0000 mg | ORAL_TABLET | Freq: Two times a day (BID) | ORAL | Status: DC
  Administered 2017-09-13 – 2017-09-17 (×9): 25 mg via ORAL
  Filled 2017-09-12 (×9): qty 1

## 2017-09-12 MED ORDER — FUROSEMIDE 10 MG/ML IJ SOLN
40.0000 mg | Freq: Once | INTRAMUSCULAR | Status: AC
  Administered 2017-09-13: 40 mg via INTRAVENOUS
  Filled 2017-09-12: qty 4

## 2017-09-12 MED ORDER — ATORVASTATIN CALCIUM 80 MG PO TABS
80.0000 mg | ORAL_TABLET | Freq: Every day | ORAL | Status: DC
  Administered 2017-09-12 – 2017-09-17 (×6): 80 mg via ORAL
  Filled 2017-09-12 (×6): qty 1

## 2017-09-12 MED ORDER — FUROSEMIDE 10 MG/ML IJ SOLN
40.0000 mg | Freq: Once | INTRAMUSCULAR | Status: AC
  Administered 2017-09-12: 40 mg via INTRAVENOUS
  Filled 2017-09-12: qty 4

## 2017-09-12 MED ORDER — ASPIRIN EC 81 MG PO TBEC
81.0000 mg | DELAYED_RELEASE_TABLET | Freq: Every day | ORAL | Status: DC
  Administered 2017-09-12 – 2017-09-17 (×6): 81 mg via ORAL
  Filled 2017-09-12 (×6): qty 1

## 2017-09-12 MED ORDER — ENOXAPARIN SODIUM 40 MG/0.4ML ~~LOC~~ SOLN
40.0000 mg | SUBCUTANEOUS | Status: DC
  Administered 2017-09-12 – 2017-09-16 (×5): 40 mg via SUBCUTANEOUS
  Filled 2017-09-12 (×5): qty 0.4

## 2017-09-12 NOTE — ED Notes (Signed)
Attempted report x1. 

## 2017-09-12 NOTE — ED Notes (Signed)
Cardiology at bedside.

## 2017-09-12 NOTE — ED Triage Notes (Signed)
Patient complains of shortness of breath x3 weeks. States she came in today because she felt worse. No dyspnea observed at rest. Patient alert, oriented, and in apparent distress at this time. O2 saturation 98% on room air.

## 2017-09-12 NOTE — ED Notes (Signed)
APP at bedside 

## 2017-09-12 NOTE — ED Provider Notes (Signed)
[facility] EMERGENCY DEPARTMENT Provider Note   CSN: [id number] Arrival date & time: 09/12/17  1219     History   Chief Complaint Chief Complaint  Patient presents with  . Shortness of Breath    HPI [name] is a 67 y.o. female.  HPI [name] is a 67 y.o. female with history of hypertension, diabetes, hyperlipidemia, CHF, coronary artery disease, presents to emergency department with complaint of shortness of breath.  Patient states that she has had intermittent and progressive shortness of breath especially on exertion over the last 3 weeks.  She states that she has to stop multiple times when walking to catch her breath.  She denies any chest pain.  She states she has had some nasal drainage and cough.  Denies any fever.  She does report increased swelling in her legs that is more than normal.  She has been compliant with all of her prescribed medications.  Denies any recent travel or surgeries.  No pleuritic pain.  She states she feels like sometimes she is wheezing.  Past Medical History:  Diagnosis Date  . Bilateral pleural effusion 02/22/2014  . Diabetes mellitus 1998   Dx in 1998. Microalbuminuria, never on insulin.  Marland Kitchen GERD 12/05/2005   Qualifier: Diagnosis of  By: Prudencio Burly MD, Phillip Heal    . GERD (gastroesophageal reflux disease)   . History of blurry vision 06/09   Hospitalized for this  . Hyperlipidemia   . Hypertension   . Personal history of colonic adenomas 10/07/2007  . Pneumonia 01/2014    Patient Active Problem List   Diagnosis Date Noted  . Viral URI with cough 03/21/2017  . Need for immunization against influenza 11/22/2016  . Acute gallstone pancreatitis   . Chronic systolic heart failure ([place])   . Anemia 08/24/2014  . Dyslipidemia 04/29/2014  . Preventative health care 04/29/2014  . Cardiomyopathy, ischemic, EF 25-30% 03/23/2014  . 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 Coronary artery disease involving native coronary artery of  native heart without angina pectoris   . Personal history of colonic adenomas 10/07/2007  . MICROALBUMINURIA 07/02/2007  . Diabetes mellitus type 2, controlled, without complications ([place]) 56/38/7564  . Hyperlipidemia 12/05/2005  . Essential hypertension 12/05/2005    Past Surgical History:  Procedure Laterality Date  . CHOLECYSTECTOMY N/A 08/06/2016   Procedure: LAPAROSCOPIC CHOLECYSTECTOMY WITH INTRAOPERATIVE CHOLANGIOGRAM;  Surgeon: Georganna Skeans, MD;  Location: [facility];  Service: General;  Laterality: N/A;  . CORONARY ARTERY BYPASS GRAFT N/A 03/01/2014   Procedure: CORONARY ARTERY BYPASS GRAFTING (CABG) x five, using left internal mammary artery and right leg greater saphenous vein harvested endoscopically;  Surgeon: Grace Isaac, MD;  Location: [facility];  Service: Open Heart Surgery;  Laterality: N/A;  . INTRAOPERATIVE TRANSESOPHAGEAL ECHOCARDIOGRAM N/A 03/01/2014   Procedure: INTRAOPERATIVE TRANSESOPHAGEAL ECHOCARDIOGRAM;  Surgeon: Grace Isaac, MD;  Location: [facility];  Service: Open Heart Surgery;  Laterality: N/A;  . LEFT AND RIGHT HEART CATHETERIZATION WITH CORONARY ANGIOGRAM N/A 02/26/2014   Procedure: LEFT AND RIGHT HEART CATHETERIZATION WITH CORONARY ANGIOGRAM;  Surgeon: Sinclair Grooms, MD;  Location[facility] C[facility] LAB;  Service: Cardiovascular;  Laterality: N/A;  . TOTAL ABDOMINAL HYSTERECTOMY W/ BILATERAL SALPINGOOPHORECTOMY  1996     OB History   None      Home Medications    Prior to Admission medications   Medication Sig Start Date End Date Taking? Authorizing Provider  aspirin 81 MG tablet Take 1 tablet (81 mg total) by  mouth daily. 10/17/15  Yes Lelon Perla, MD  atorvastatin (LIPITOR) 80 MG tablet TAKE ONE TABLET BY MOUTH DAILY 06/19/17  Yes Lelon Perla, MD  carvedilol (COREG) 25 MG tablet TAKE ONE TABLET BY MOUTH TWICE A DAY WITH A MEAL Patient taking differently: Take 25 mg by mouth 2 (two) times daily with a meal.  06/19/17  Yes Crenshaw, Denice Bors, MD    lisinopril (PRINIVIL,ZESTRIL) 40 MG tablet Take 1 tablet (40 mg total) by mouth daily. 08/16/16 09/12/17 Yes Ledell Noss, MD  metFORMIN (GLUCOPHAGE) 500 MG tablet TAKE ONE TABLET BY MOUTH TWICE A DAY WITHA MEAL Patient taking differently: Take 500 mg by mouth 2 (two) times daily with a meal.  08/27/17  Yes Neva Seat, MD  spironolactone (ALDACTONE) 25 MG tablet Take 1 tablet (25 mg total) by mouth daily. 08/30/16 09/12/17 Yes Lelon Perla, MD  lisinopril (PRINIVIL,ZESTRIL) 40 MG tablet TAKE ONE TABLET BY MOUTH DAILY Patient not taking: Reported on 09/12/2017 06/19/17   Lelon Perla, MD    Family History Family History  Problem Relation Age of Onset  . Diabetes Mother        Mother died of MI at age 55  . Heart failure Mother   . Kidney failure Mother   . Heart failure Sister        Died  . Heart failure Brother        Died  . Diabetes Brother        Died    Social History Social History   Tobacco Use  . Smoking status: Never Smoker  . Smokeless tobacco: Never Used  Substance Use Topics  . Alcohol use: No    Alcohol/week: 0.0 standard drinks  . Drug use: No     Allergies   Patient has no known allergies.   Review of Systems Review of Systems  Constitutional: Negative for chills and fever.  Respiratory: Positive for cough and shortness of breath. Negative for chest tightness.   Cardiovascular: Positive for leg swelling. Negative for chest pain and palpitations.  Gastrointestinal: Negative for abdominal pain, diarrhea, nausea and vomiting.  Genitourinary: Negative for dysuria, flank pain, pelvic pain, vaginal bleeding, vaginal discharge and vaginal pain.  Musculoskeletal: Negative for arthralgias, myalgias, neck pain and neck stiffness.  Skin: Negative for rash.  Neurological: Negative for dizziness, weakness and headaches.  All other systems reviewed and are negative.    Physical Exam Updated Vital Signs BP (!) 143/90 (BP Location: Left Arm)   Pulse 87    Temp 98.8 F (37.1 C) (Oral)   Resp 18   SpO2 100%   Physical Exam  Constitutional: She is oriented to person, place, and time. She appears well-developed and well-nourished. No distress.  HENT:  Head: Normocephalic.  Eyes: Conjunctivae are normal.  Neck: Neck supple.  Cardiovascular: Normal rate, regular rhythm and normal heart sounds.  Pulmonary/Chest: Effort normal and breath sounds normal. No respiratory distress. She has no wheezes. She has no rales. She exhibits no tenderness.  Abdominal: Soft. Bowel sounds are normal. She exhibits no distension. There is no tenderness. There is no rebound.  Musculoskeletal: She exhibits edema.  3+ bilateral pitting LE edema  Neurological: She is alert and oriented to person, place, and time.  Skin: Skin is warm and dry.  Psychiatric: She has a normal mood and affect. Her behavior is normal.  Nursing note and vitals reviewed.    ED Treatments / Results  Labs (all labs ordered are listed, but only  abnormal results are displayed) Labs Reviewed  CBC WITH DIFFERENTIAL/PLATELET - Abnormal; Notable for the following components:      Result Value   RBC 3.49 (*)    Hemoglobin 9.8 (*)    HCT 30.8 (*)    All other components within normal limits  COMPREHENSIVE METABOLIC PANEL - Abnormal; Notable for the following components:   Chloride 112 (*)    Creatinine, Ser 1.12 (*)    Albumin 3.3 (*)    AST 88 (*)    ALT 110 (*)    GFR calc non Af Amer 50 (*)    GFR calc Af Amer 58 (*)    All other components within normal limits  BRAIN NATRIURETIC PEPTIDE - Abnormal; Notable for the following components:   B Natriuretic Peptide 2,171.3 (*)    All other components within normal limits  I-STAT TROPONIN, ED    EKG EKG Interpretation  Date/Time:  Thursday September 12 2017 12:27:47 EDT Ventricular Rate:  87 PR Interval:  168 QRS Duration: 142 QT Interval:  440 QTC Calculation: 529 R Axis:   127 Text Interpretation:  Normal sinus rhythm Right  axis deviation Non-specific intra-ventricular conduction block Cannot rule out Anterior infarct , age undetermined Abnormal ECG conduction delay more prominent compared to July 2018 Confirmed by Sherwood Gambler [phone]) on 09/12/2017 5:55:15 PM   Radiology Dg Chest 2 View  Result Date: 09/12/2017 CLINICAL DATA:  Shortness of breath. EXAM: CHEST - 2 VIEW COMPARISON:  08/04/2016. FINDINGS: Prior CABG. Cardiomegaly. Low lung volumes with mild bilateral lateral atelectatic changes/infiltrates. Small bilateral pleural effusions. No pneumothorax. Surgical clips upper abdomen. No acute bony abnormality. IMPRESSION: 1.  Prior CABG.  Cardiomegaly.  No pulmonary venous congestion. 2. Low lung volumes with mild bilateral atelectasis/infiltrates. Small bilateral pleural effusions. Electronically Signed   By: Marcello Moores  Register   On: 09/12/2017 12:54    Procedures Procedures (including critical care time)  Medications Ordered in ED Medications  enoxaparin (LOVENOX) injection 40 mg (has no administration in time range)  aspirin EC tablet 81 mg (has no administration in time range)  atorvastatin (LIPITOR) tablet 80 mg (has no administration in time range)  carvedilol (COREG) tablet 25 mg (has no administration in time range)  furosemide (LASIX) injection 40 mg (has no administration in time range)  furosemide (LASIX) injection 40 mg (40 mg Intravenous Given 09/12/17 1901)     Initial Impression / Assessment and Plan / ED Course  I have reviewed the triage vital signs and the nursing notes.  Pertinent labs & imaging results that were available during my care of the patient were reviewed by me and considered in my medical decision making (see chart for details).     Patient in emergency department with increased shortness of breath of the last 3 weeks.  She is got bilateral pitting edema that she states is worse than usual.  Her labs obtained in triage show normal troponin.Hemoglobin of 9.8 which is close to  baseline.  Mildly elevated LFTs which is also at her baseline.  And significantly elevated BNP at 2171.  X-ray with no significant pulmonary edema, although there are bilateral pleural effusions and atelectasis versus infiltrate.  At this time, patient is afebrile, normal white blood cell count, doubt this is pneumonia.  Most likely fluid.  I will give her a dose of Lasix.  Patient is tachypnea, breathing 25-30 times a minute on my exam.  She does not hypoxic, at least at rest.  I reviewed her records, her last  echo was in 2016 showing 25 to 30% EF with diffuse hypokinesis.   Discussed with Dr. Regenia Skeeter.  In setting of tachypnea with fluid overload, will admit for diuresis and further treatment.  Spoke with internal med service, will admit pt.   Vitals:   09/12/17 1945 09/12/17 2001 09/12/17 2015 09/12/17 2100  BP: (!) 155/85  (!) 153/93 (!) 117/106  Pulse: 87  90 96  Resp: 13     Temp:      TempSrc:      SpO2: 97%  97% 97%  Weight:  84.1 kg      Final Clinical Impressions(s) / ED Diagnoses   Final diagnoses:  Dyspnea, unspecified type  Congestive heart failure, unspecified HF chronicity, unspecified heart failure type [facility])    ED Discharge Orders    None      Jeannett Senior, PA-C 09/12/17 2211  Sherwood Gambler, MD 09/12/17 2245

## 2017-09-12 NOTE — H&P (Addendum)
Date: 09/13/2017               Patient Name:  [name] MRN: [id number]  DOB: 1950/08/10 Age / Sex: 67 y.o., female   PCP: Neva Seat, MD         Medical Service: Internal Medicine Teaching Service         Attending Physician: Dr. Oval Linsey, MD    First Contact: Dr. Gilberto Better Pager: [phone]  Second Contact: Dr. Einar Gip Pager: [phone]       After Hours (After 5p/  First Contact Pager: [phone]  weekends / holidays): Second Contact Pager: [phone]   Chief Complaint: Shortness of breath  History of Present Illness: 67 year old female with past medical history of CAD (s/p CABG and 5 stents 2016), HFrEF (EF:25-30% on Echo 2016), HTN, DM(on Metformin), GERD (untreated), presented with worsening of shortness of breath x 3 weeks. She has hah dyspnea on exersion for past couple of months.  Per her grandson, when walked for shopping, she had to stopped multiple times to catch her breath. Her SOB progressed in past 3 weeks as she got exhausted when climbing or coming down of the stairs inside her home (flight of 13 stairs at home). Patient also reports some dry cough in last week that mostly occurred at night when she slept flat. Not associated with fever. She states that she developed bilateral lower extremity edema in past few weeks. Reports about her weight increased for 1 lb increase in her weight in past week. On the day of admission, her sob got worse and was not able to lye back and she came to ED. Denies any chest pain, nausea vomiting, dizziness, syncope.   Meds:  Current Meds  Medication Sig  . aspirin 81 MG tablet Take 1 tablet (81 mg total) by mouth daily.  Marland Kitchen atorvastatin (LIPITOR) 80 MG tablet TAKE ONE TABLET BY MOUTH DAILY  . carvedilol (COREG) 25 MG tablet TAKE ONE TABLET BY MOUTH TWICE A DAY WITH A MEAL (Patient taking differently: Take 25 mg by mouth 2 (two) times daily with a meal. )  . lisinopril (PRINIVIL,ZESTRIL) 40 MG tablet Take 1 tablet (40 mg  total) by mouth daily.  . metFORMIN (GLUCOPHAGE) 500 MG tablet TAKE ONE TABLET BY MOUTH TWICE A DAY WITHA MEAL (Patient taking differently: Take 500 mg by mouth 2 (two) times daily with a meal. )  . spironolactone (ALDACTONE) 25 MG tablet Take 1 tablet (25 mg total) by mouth daily.     Allergies: Allergies as of 09/12/2017  . (No Known Allergies)   Past Medical History:  Diagnosis Date  . Bilateral pleural effusion 02/22/2014  . Diabetes mellitus 1998   Dx in 1998. Microalbuminuria, never on insulin.  Marland Kitchen GERD 12/05/2005   Qualifier: Diagnosis of  By: Prudencio Burly MD, Phillip Heal    . GERD (gastroesophageal reflux disease)   . History of blurry vision 06/09   Hospitalized for this  . Hyperlipidemia   . Hypertension   . Personal history of colonic adenomas 10/07/2007  . Pneumonia 01/2014    Family History: ESRD in mother, DM in mother, CAD & CABG in twin brother  Social History: Lives with her daughter family, No Hx of smoking, (Possible passive smoker), no alcohol, no drug use  Review of Systems: A complete ROS was negative except as per HPI.   Physical Exam: Blood pressure (!) 154/87, pulse 91, temperature 98.4 F (36.9 C), temperature source Oral, resp. rate (!) 28, height  5\' 2"  (1.575 m), weight 82.5 kg, SpO2 90 %. Physical Exam  Constitutional: She appears well-developed and well-nourished. No distress.  HENT:  Head: Normocephalic and atraumatic.  Neck: JVD present.  Cardiovascular: Normal rate and regular rhythm. Pronounced S2, diastolic murmur ? Murmur heard. Pulmonary/Chest: sternal scar 2/2 CABG, Tachypnea noted. No respiratory distress.  She has rales in the right middle field, the right lower field, the left middle field and the left lower field.  Abdominal: Soft. Bowel sounds are normal. There is no tenderness. There is no rebound.  Musculoskeletal: Bilateral 3+ edema on lower extremities up to knee Skin: Skin is warm.  CMP Latest Ref Rng & Units 09/13/2017 09/12/2017 03/08/2017    Glucose 70 - 99 mg/dL 80 83 152(H)  BUN 8 - 23 mg/dL 16 15 25   Creatinine 0.44 - 1.00 mg/dL 1.18(H) 1.12(H) 1.26(H)  Sodium 135 - 145 mmol/L 142 141 141  Potassium 3.5 - 5.1 mmol/L 4.2 4.2 4.9  Chloride 98 - 111 mmol/L 111 112(H) 103  CO2 22 - 32 mmol/L 24 22 22   Calcium 8.9 - 10.3 mg/dL 8.7(L) 9.0 9.0  Total Protein 6.5 - 8.1 g/dL - 6.8 7.0  Total Bilirubin 0.3 - 1.2 mg/dL - 0.6 <0.2  Alkaline Phos 38 - 126 U/L - 99 104  AST 15 - 41 U/L - 88(H) 30  ALT 0 - 44 U/L - 110(H) 24   CBC Latest Ref Rng & Units 09/12/2017 11/22/2016 08/08/2016  WBC 4.0 - 10.5 K/uL 5.9 7.0 7.6  Hemoglobin 12.0 - 15.0 g/dL 9.8(L) 9.7(L) 7.5(L)  Hematocrit 36.0 - 46.0 % 30.8(L) 30.5(L) 23.1(L)  Platelets 150 - 400 K/uL 177 185 107(L)    EKG: personally reviewed my interpretation is rt axis deviation with LBBB patern?, boarderline long QT  CXR: personally reviewed my interpretation is pleural effusion, increased hilar vascularity, cardiomegaly   Assessment & Plan by Problem: Active Problems:   Acute exacerbation of CHF (congestive heart failure) ([place])   67 year old female with past medical history of CAD(s/p CABG and 5 stents 2016), CHF (EF:20-25% on Echo 2016), HTN, DM, GERD (untreated), presented with worsening DOE, LE edema, dry cough when lying flat began 3 weeks ago and worsened today.  No chest pain. No palpitation Patient has Hx of HFrEF with EF of 25-30 % on echo 2016.  On Spironolactone, Lisinopril, coreg. (Not on Lasix at home). BNP: 2171 (was 520, 3 y ago). (Weight today: 82.9 kg, weight on 06/20/2017: 84.4 kg)  CHF exacerbation: -IV lasix 40 mg -Strict I/O -Weight daily -BMP daily -Echo -Hold the spironolactone -SCDs  DM: On Metformin at home. Last (05/2017): HbA1c: 7 -Hold Metformin (to prevent nephrotoxicity if plan for stress echo) -Monitor Blood Glucose -Follow up at clinic  CAD s/p CABG and stent x5 on 2016. No ACS symptoms -Continue aspirin 81 mg daily -Continue Lipitor 80 mg  daily  HTN: -Continue Coreg 25 mg twice daily -Hold Spironolactone.   HLD: Last (6 months ago) lipid panel: TG: 76, LDL: 93, HDL:68  -Continue Lipitor 80 mg daily  GERD:  -Recommend starting Tx or follow up outpatient  Transminitis: New finding, no RUQ tenderness. Bil Nl.  -Follow up CMP   Diet: Heart healthy IV fluid: n/a CODE STATUS: Full code  VTE prophylaxis: Enoxaparin   dispo: Admit patient to Inpatient with expected length of stay greater than 2 midnights.  SignedDewayne Hatch, MD 09/13/2017, 6:52 AM  Pager: [phone]

## 2017-09-12 NOTE — Telephone Encounter (Signed)
[facility] telephone nurse reviewed meds.

## 2017-09-12 NOTE — ED Notes (Signed)
Patient called out.  States she has not been able to urinate into the purewick device, and she is now having abdominal pain.  States pain/pressure from needing to urinate.  Assisted patient to bathroom, and patient had large urinary output.  Patient could not wait for this RN to go get hat to measure output.

## 2017-09-13 ENCOUNTER — Inpatient Hospital Stay (HOSPITAL_COMMUNITY): Payer: Medicare Other

## 2017-09-13 ENCOUNTER — Encounter (HOSPITAL_COMMUNITY): Payer: Self-pay | Admitting: General Practice

## 2017-09-13 ENCOUNTER — Other Ambulatory Visit: Payer: Self-pay

## 2017-09-13 DIAGNOSIS — E785 Hyperlipidemia, unspecified: Secondary | ICD-10-CM

## 2017-09-13 DIAGNOSIS — Z951 Presence of aortocoronary bypass graft: Secondary | ICD-10-CM

## 2017-09-13 DIAGNOSIS — I361 Nonrheumatic tricuspid (valve) insufficiency: Secondary | ICD-10-CM

## 2017-09-13 DIAGNOSIS — Z79899 Other long term (current) drug therapy: Secondary | ICD-10-CM

## 2017-09-13 DIAGNOSIS — Z7982 Long term (current) use of aspirin: Secondary | ICD-10-CM

## 2017-09-13 DIAGNOSIS — R740 Nonspecific elevation of levels of transaminase and lactic acid dehydrogenase [LDH]: Secondary | ICD-10-CM

## 2017-09-13 DIAGNOSIS — Z833 Family history of diabetes mellitus: Secondary | ICD-10-CM

## 2017-09-13 DIAGNOSIS — I4581 Long QT syndrome: Secondary | ICD-10-CM

## 2017-09-13 DIAGNOSIS — K219 Gastro-esophageal reflux disease without esophagitis: Secondary | ICD-10-CM

## 2017-09-13 DIAGNOSIS — E119 Type 2 diabetes mellitus without complications: Secondary | ICD-10-CM

## 2017-09-13 DIAGNOSIS — I447 Left bundle-branch block, unspecified: Secondary | ICD-10-CM

## 2017-09-13 DIAGNOSIS — I110 Hypertensive heart disease with heart failure: Principal | ICD-10-CM

## 2017-09-13 DIAGNOSIS — Z8249 Family history of ischemic heart disease and other diseases of the circulatory system: Secondary | ICD-10-CM

## 2017-09-13 DIAGNOSIS — Z955 Presence of coronary angioplasty implant and graft: Secondary | ICD-10-CM

## 2017-09-13 DIAGNOSIS — Z7984 Long term (current) use of oral hypoglycemic drugs: Secondary | ICD-10-CM

## 2017-09-13 DIAGNOSIS — I2510 Atherosclerotic heart disease of native coronary artery without angina pectoris: Secondary | ICD-10-CM

## 2017-09-13 DIAGNOSIS — I5023 Acute on chronic systolic (congestive) heart failure: Secondary | ICD-10-CM

## 2017-09-13 LAB — BASIC METABOLIC PANEL
Anion gap: 7 (ref 5–15)
BUN: 16 mg/dL (ref 8–23)
CALCIUM: 8.7 mg/dL — AB (ref 8.9–10.3)
CO2: 24 mmol/L (ref 22–32)
CREATININE: 1.18 mg/dL — AB (ref 0.44–1.00)
Chloride: 111 mmol/L (ref 98–111)
GFR calc Af Amer: 54 mL/min — ABNORMAL LOW (ref >60)
GFR calc non Af Amer: 47 mL/min — ABNORMAL LOW (ref >60)
GLUCOSE: 80 mg/dL (ref 70–99)
Potassium: 4.2 mmol/L (ref 3.5–5.1)
Sodium: 142 mmol/L (ref 135–145)

## 2017-09-13 LAB — HIV ANTIBODY (ROUTINE TESTING W REFLEX): HIV Screen 4th Generation wRfx: NONREACTIVE

## 2017-09-13 LAB — ECHOCARDIOGRAM COMPLETE
HEIGHTINCHES: 62 in
WEIGHTICAEL: 2910.4 [oz_av]

## 2017-09-13 LAB — GLUCOSE, CAPILLARY: Glucose-Capillary: 200 mg/dL — ABNORMAL HIGH (ref 70–99)

## 2017-09-13 MED ORDER — INSULIN ASPART 100 UNIT/ML ~~LOC~~ SOLN
0.0000 [IU] | Freq: Three times a day (TID) | SUBCUTANEOUS | Status: DC
  Administered 2017-09-14 – 2017-09-16 (×4): 3 [IU] via SUBCUTANEOUS
  Administered 2017-09-17: 5 [IU] via SUBCUTANEOUS

## 2017-09-13 MED ORDER — FUROSEMIDE 10 MG/ML IJ SOLN: 40.0000 mg | Freq: Every day | INTRAMUSCULAR | Status: DC

## 2017-09-13 MED ORDER — FUROSEMIDE 10 MG/ML IJ SOLN
40.0000 mg | Freq: Two times a day (BID) | INTRAMUSCULAR | Status: DC
  Administered 2017-09-13 – 2017-09-15 (×4): 40 mg via INTRAVENOUS
  Filled 2017-09-13 (×4): qty 4

## 2017-09-13 MED ORDER — SPIRONOLACTONE 25 MG PO TABS
25.0000 mg | ORAL_TABLET | Freq: Every day | ORAL | Status: DC
  Administered 2017-09-13 – 2017-09-17 (×5): 25 mg via ORAL
  Filled 2017-09-13 (×5): qty 1

## 2017-09-13 MED ORDER — INSULIN ASPART 100 UNIT/ML ~~LOC~~ SOLN
0.0000 [IU] | Freq: Every day | SUBCUTANEOUS | Status: DC
  Administered 2017-09-15: 2 [IU] via SUBCUTANEOUS

## 2017-09-13 MED ORDER — LISINOPRIL 40 MG PO TABS
40.0000 mg | ORAL_TABLET | Freq: Every day | ORAL | Status: DC
  Administered 2017-09-13 – 2017-09-17 (×5): 40 mg via ORAL
  Filled 2017-09-13 (×5): qty 1

## 2017-09-13 MED ORDER — PERFLUTREN LIPID MICROSPHERE
1.0000 mL | INTRAVENOUS | Status: AC | PRN
  Administered 2017-09-13: 2 mL via INTRAVENOUS
  Filled 2017-09-13: qty 10

## 2017-09-13 NOTE — Progress Notes (Signed)
Internal Medicine Attending  Date: 09/13/2017  Patient name: [name] Medical record number: [id number] Date of birth: [birth year] Age: 67 y.o. Gender: female  I saw and evaluated the patient. I reviewed the resident's note by Dr. Truman Hayward and I agree with the resident's findings and plans as documented in his progress note.  Please see my H&P dated 09/13/2017 and attached to Dr. Darcey Nora H&P dated 09/12/2017 for the specifics of my evaluation, assessment, plan from earlier in the day.

## 2017-09-13 NOTE — Progress Notes (Signed)
  Echocardiogram 2D Echocardiogram has been performed.  Jennette Dubin 09/13/2017, 9:32 AM

## 2017-09-13 NOTE — Progress Notes (Signed)
Pt is alert and oriented with no complaints eating meals with no did stress education hf packed given with understanding

## 2017-09-13 NOTE — Progress Notes (Signed)
Stopped in and introduced myself to this patient.  Patient wanted me to leave the paperwork with her and he would speak with her family and have them help her complete it.  Left with patient as requested.      09/13/17 1222  Clinical Encounter Type  Visited With Patient  Visit Type Initial;Spiritual support

## 2017-09-13 NOTE — Progress Notes (Signed)
Subjective: [name] says she is feeling better this morning and is having an easier time breathing compared to when she came to the hospital. She says she has been urinating frequently after receiving Lasix last night. She feels like her legs are starting to get lighter. She denies any chest pain since coming to the hospital.   Objective: Vital signs in last 24 hours: Vitals:   09/13/17 0025 09/13/17 0339 09/13/17 0353 09/13/17 1109  BP: 140/84 (!) 154/87  (!) 143/78  Pulse: (!) 115 91  88  Resp: 16 (!) 28    Temp:  98.4 F (36.9 C)  98.6 F (37 C)  TempSrc:  Oral  Oral  SpO2: 90%   97%  Weight:   82.5 kg   Height:       Weight change:   Intake/Output Summary (Last 24 hours) at 09/13/2017 1231 Last data filed at 09/13/2017 0300 Gross per 24 hour  Intake -  Output 500 ml  Net -500 ml   BP (!) 143/78 (BP Location: Right Arm)   Pulse 88   Temp 98.6 F (37 C) (Oral)   Resp (!) 28 Comment: RN Notified   Ht 5\' 2"  (1.575 m)   Wt 82.5 kg   SpO2 97%   BMI 33.27 kg/m  General appearance: Alert, lying in bed at ~20 degree angle, watching tv  Lungs: Bibasilar rales; no increased work of breathing on room air at time of exam  Heart: Regular rate and rhythm. Slight ?systolic murmur appreciated. JVD 11 cm.  Extremities: Bilateral 2+ pretibial pitting edema.   Lab Results:  BMP Latest Ref Rng & Units 09/13/2017 09/12/2017 03/08/2017  Glucose 70 - 99 mg/dL 80 83 152(H)  BUN 8 - 23 mg/dL 16 15 25   Creatinine 0.44 - 1.00 mg/dL 1.18(H) 1.12(H) 1.26(H)  BUN/Creat Ratio 12 - 28 - - 20  Sodium 135 - 145 mmol/L 142 141 141  Potassium 3.5 - 5.1 mmol/L 4.2 4.2 4.9  Chloride 98 - 111 mmol/L 111 112(H) 103  CO2 22 - 32 mmol/L 24 22 22   Calcium 8.9 - 10.3 mg/dL 8.7(L) 9.0 9.0   BNP: 2171    Micro Results: No results found for this or any previous visit (from the past 240 hour(s)). Studies/Results: Dg Chest 2 View  Result Date: 09/12/2017 CLINICAL DATA:  Shortness of breath. EXAM:  CHEST - 2 VIEW COMPARISON:  08/04/2016. FINDINGS: Prior CABG. Cardiomegaly. Low lung volumes with mild bilateral lateral atelectatic changes/infiltrates. Small bilateral pleural effusions. No pneumothorax. Surgical clips upper abdomen. No acute bony abnormality. IMPRESSION: 1.  Prior CABG.  Cardiomegaly.  No pulmonary venous congestion. 2. Low lung volumes with mild bilateral atelectasis/infiltrates. Small bilateral pleural effusions. Electronically Signed   By: Marcello Moores  Register   On: 09/12/2017 12:54   Medications: I have reviewed the patient's current medications. Scheduled Meds: . aspirin EC  81 mg Oral Daily  . atorvastatin  80 mg Oral Daily  . carvedilol  25 mg Oral BID WC  . enoxaparin (LOVENOX) injection  40 mg Subcutaneous Q24H  . lisinopril  40 mg Oral Daily  . spironolactone  25 mg Oral Daily   Continuous Infusions: PRN Meds:.  Assessment/Plan: Active Problems:   Acute exacerbation of CHF (congestive heart failure) [facility])  [name] is a 67 yo woman with PMHx CAD s/p CABG + 5 stents in 9024, Systolic and diastolic heart failure, O9BD, and HTN who presented to the ED with a 3 week history of progressive shortness of breath  and is being managed for an acute HF exacerbation.    Acute Exacerbation of systolic heart failure  Symptoms improving with diuresis. JVD and 2+ pitting edema still evident on exam. Previous Echo in 2016 showed EF of 25-30% with grade I diastolic dysfunction. Reassessing progression of heart failure with repeat Echo. - Follow up repeat Echo  - Continue IV Lasix 40 mg  - Increase Lasix 40mg  to BID if continuing to be fluid overloaded   HTN: Hypertensive to [id number] systolic. Spironolactone and lisinopril initially held due to concerns of multiple nephrotoxic medications in the setting of starting IV lasix. However, creatinine is stable this morning after receiving dose of IV lasix yesterday. Given hypertension on just Coreg, will resume other home  antihypertensives for better blood pressure control while hospitalized.  -Continue carvedilol 25 mg twice BID -Restarting home Spironolactone 25 mg and lisinopril 40 mg.   CAD s/p CABG and stent x5 on 2016 No symptoms of chest pain.  -Continue aspirin 81 mg daily -Continue Atorvastatin 80 mg daily  Mild Transminitis: New finding noted on admission, no RUQ tenderness. Bil Nl.  -am CMP  DM  Last A1C in May 2019 of 7.0. On Metformin at home. Glucoses have been in the 80s since admission, so not starting on sliding scale insulin.  -am CMP   Diet: Heart healthy  VTE prophylaxis: Lovenox  Code Status: Full code   This is a Careers information officer Note.  The care of the patient was discussed with Dr. Truman Hayward and the assessment and plan formulated with their assistance.  Please see their attached note for official documentation of the daily encounter.   LOS: 1 day   [place], Annetta Maw, Medical Student 09/13/2017, 12:31 PM    Attestation for Student Documentation:  I personally was present and performed or re-performed the history, physical exam and medical decision-making activities of this service and have verified that the service and findings are accurately documented in the student's note.  Mosetta Anis, MD 09/13/2017, 1:59 PM  Pager: [phone]

## 2017-09-14 LAB — GLUCOSE, CAPILLARY
GLUCOSE-CAPILLARY: 147 mg/dL — AB (ref 70–99)
Glucose-Capillary: 92 mg/dL (ref 70–99)
Glucose-Capillary: 180 mg/dL — ABNORMAL HIGH (ref 70–99)
Glucose-Capillary: 116 mg/dL — ABNORMAL HIGH (ref 70–99)
Glucose-Capillary: 202 mg/dL — ABNORMAL HIGH (ref 70–99)

## 2017-09-14 LAB — COMPREHENSIVE METABOLIC PANEL
ALBUMIN: 2.6 g/dL — AB (ref 3.5–5.0)
ALK PHOS: 79 U/L (ref 38–126)
ALT: 75 U/L — ABNORMAL HIGH (ref 0–44)
ANION GAP: 9 (ref 5–15)
AST: 56 U/L — ABNORMAL HIGH (ref 15–41)
BILIRUBIN TOTAL: 0.5 mg/dL (ref 0.3–1.2)
BUN: 15 mg/dL (ref 8–23)
CALCIUM: 8.7 mg/dL — AB (ref 8.9–10.3)
CO2: 25 mmol/L (ref 22–32)
Chloride: 108 mmol/L (ref 98–111)
Creatinine, Ser: 1.3 mg/dL — ABNORMAL HIGH (ref 0.44–1.00)
GFR calc Af Amer: 48 mL/min — ABNORMAL LOW (ref >60)
GFR, EST NON AFRICAN AMERICAN: 41 mL/min — AB (ref >60)
Glucose, Bld: 95 mg/dL (ref 70–99)
Potassium: 3.7 mmol/L (ref 3.5–5.1)
Sodium: 142 mmol/L (ref 135–145)
TOTAL PROTEIN: 5.5 g/dL — AB (ref 6.5–8.1)

## 2017-09-14 LAB — MAGNESIUM: MAGNESIUM: 1.5 mg/dL — AB (ref 1.7–2.4)

## 2017-09-14 MED ORDER — POTASSIUM CHLORIDE CRYS ER 20 MEQ PO TBCR: 40.0000 meq | EXTENDED_RELEASE_TABLET | Freq: Once | ORAL | Status: DC

## 2017-09-14 MED ORDER — MAGNESIUM SULFATE 4 GM/100ML IV SOLN
4.0000 g | Freq: Once | INTRAVENOUS | Status: AC
  Administered 2017-09-14: 4 g via INTRAVENOUS
  Filled 2017-09-14: qty 100

## 2017-09-14 NOTE — Progress Notes (Signed)
Subjective:  [name] is a 67 yo F w/ PMH of CAD s/p CABG x5, CHF, T2DM, and HTN admit for progressive dyspnea and edema diagnosed with acute on chronic heart failure exacerbation on hospital day 3  [name] reports of no acute overnight events. She states she is continuing to endorse frequent urination on Lasix. She is aware of her cardiac condition and states understanding that she will be at the hospital until fluid status is controlled. Denies any light-headedness, dizziness, chest pain, palpitations, nausea, vomiting.  Objective:  Vital signs in last 24 hours: Vitals:   09/13/17 1530 09/13/17 2050 09/14/17 0612 09/14/17 1159  BP: 120/83 108/72 137/79 97/60  Pulse: 87 81 79 74  Resp:  17 17 20   Temp:  98.4 F (36.9 C) 98.1 F (36.7 C) 97.9 F (36.6 C)  TempSrc:  Oral Oral Oral  SpO2: 98% 99% 98% 98%  Weight:   81.4 kg   Height:       Physical Exam  Constitutional: She is oriented to person, place, and time and well-developed, well-nourished, and in no distress. No distress.  HENT:  Head: Normocephalic and atraumatic.  Mouth/Throat: Oropharynx is clear and moist. No oropharyngeal exudate.  Eyes: Pupils are equal, round, and reactive to light. Conjunctivae and EOM are normal. No scleral icterus.  Neck: Normal range of motion. Neck supple. JVD (+5cm) present. No thyromegaly present.  Cardiovascular: Normal rate, regular rhythm, normal heart sounds and intact distal pulses.  Pulmonary/Chest: Effort normal. No respiratory distress. She has no wheezes. She has rales (rales on bilateral lower lung bases).  Abdominal: Soft. Bowel sounds are normal. She exhibits no distension. There is no tenderness. There is no guarding.  Musculoskeletal: Normal range of motion. She exhibits edema (2+ pitting edema bilateral lower extremities). She exhibits no tenderness or deformity.  Neurological: She is alert and oriented to person, place, and time. No cranial nerve deficit. Gait normal.    Skin: Skin is warm and dry. No rash noted. She is not diaphoretic. No erythema.   Assessment/Plan:  Active Problems:   Acute exacerbation of CHF (congestive heart failure) (HCC)   Acute on chronic systolic heart failure (HCC)   Left bundle branch block   Type II diabetes mellitus, well controlled (HCC)  [name] is a 67 yo F w/ PMH of CAD s/p CABG x5, CHF, T2DM, and HTN admit for progressive dyspnea and edema diagnosed with acute on chronic heart failure exacerbation currently undergoing diuresis but still examining fluid overload. May need to increase Lasix dose as her physical exam and weight change is lower than what would be expected on current Lasix dose but bp is good.  Acute on Chronic HFrEF Lasix increased to BID dosing yesterday but continuing to endorse JVD and pitting edema. Echo yesterday show no significant change from prior with EF of 25%. Plan on continuing diuresis. Potassium 3.7 Creatinine 1.3 Magnesium 1.5 - Daily BMP - Mag 4g IV - C/w Lasix 40mg  IV Bid  Hypertension Home meds spironolactone and lisinopril restarted yesterday. Current blood pressure at goal 120/83. Creatinine slightly elevated. Will continue to monitor - C/w Home meds: Carvedilol 25mg  Bid, Spironolactone 25mg  daily, lisinopril 40mg  daily  CAD Not currently endorsing chest pain. - C/w home meds: ASA 81mg  daily, atorvastatin 80mg  daily  DM - sliding scale insulin  Diet: Cardiac diet fluid <1800cc VTE prophx: Lovenox Code Status: Full  Dispo: Anticipated discharge in approximately 2 day(s).   Mosetta Anis, MD 09/14/2017, 12:28 PM Pager: [phone]

## 2017-09-14 NOTE — Progress Notes (Addendum)
Pt is stable, vitals stable, Unna boots applied by ortho tech, pt has a nice urine output, denies chest pain and SOB, good appetite, will continue to monitor the patient  Palma Holter, Therapist, sports

## 2017-09-14 NOTE — Progress Notes (Signed)
Internal Medicine Attending:   I saw and examined the patient. I reviewed the resident's note and I agree with the resident's findings and plan as documented in the resident's note.  Acute on chronic HFrEF, but doing well with diuresis, weight decreasing nicely. Continues to look very volume up, I think we have several more days of IV diuresis to go before transitioning to oral agents. Electrolytes repleted today. Continuing carvedilol, lisinopril, spironolactone, aspirin, and atorvastatin, no need for changes today.

## 2017-09-14 NOTE — Plan of Care (Signed)
  Problem: Nutrition: Goal: Adequate nutrition will be maintained Outcome: Completed/Met   Problem: Coping: Goal: Level of anxiety will decrease Outcome: Completed/Met   Problem: Pain Managment: Goal: General experience of comfort will improve Outcome: Completed/Met   

## 2017-09-15 LAB — GLUCOSE, CAPILLARY
Glucose-Capillary: 79 mg/dL (ref 70–99)
Glucose-Capillary: 158 mg/dL — ABNORMAL HIGH (ref 70–99)
Glucose-Capillary: 106 mg/dL — ABNORMAL HIGH (ref 70–99)
Glucose-Capillary: 205 mg/dL — ABNORMAL HIGH (ref 70–99)

## 2017-09-15 LAB — BASIC METABOLIC PANEL
ANION GAP: 6 (ref 5–15)
BUN: 17 mg/dL (ref 8–23)
CO2: 27 mmol/L (ref 22–32)
Calcium: 8.7 mg/dL — ABNORMAL LOW (ref 8.9–10.3)
Chloride: 111 mmol/L (ref 98–111)
Creatinine, Ser: 1.4 mg/dL — ABNORMAL HIGH (ref 0.44–1.00)
GFR calc Af Amer: 44 mL/min — ABNORMAL LOW (ref >60)
GFR, EST NON AFRICAN AMERICAN: 38 mL/min — AB (ref >60)
GLUCOSE: 99 mg/dL (ref 70–99)
POTASSIUM: 3.9 mmol/L (ref 3.5–5.1)
Sodium: 144 mmol/L (ref 135–145)

## 2017-09-15 LAB — MAGNESIUM: Magnesium: 2 mg/dL (ref 1.7–2.4)

## 2017-09-15 MED ORDER — FUROSEMIDE 10 MG/ML IJ SOLN
40.0000 mg | Freq: Every day | INTRAMUSCULAR | Status: DC
  Administered 2017-09-16 – 2017-09-17 (×2): 40 mg via INTRAVENOUS
  Filled 2017-09-15 (×2): qty 4

## 2017-09-15 NOTE — Plan of Care (Signed)
  Problem: Activity: Goal: Risk for activity intolerance will decrease Outcome: Completed/Met   Problem: Elimination: Goal: Will not experience complications related to bowel motility Outcome: Completed/Met

## 2017-09-15 NOTE — Progress Notes (Signed)
Pt is stable, vitals stable, Unna boots applied by ortho tech, pt has a nice urine output, denies chest pain and SOB, good appetite, will continue to monitor the patient  Palma Holter, Therapist, sports

## 2017-09-15 NOTE — Progress Notes (Signed)
Subjective: [name] is doing well this morning and continues to feel like her breathing is more comfortable. She feels like her legs are starting to get less swollen as well. She continues to urinate frequently with the IV lasix and feels it is working well to help to get some of her fluid off. She is without new symptoms or complaints and denies any chest pain.   Objective: Vital signs in last 24 hours: Vitals:   09/14/17 0612 09/14/17 1159 09/14/17 2005 09/15/17 0419  BP: 137/79 97/60 114/65 126/89  Pulse: 79 74 79 80  Resp: 17 20 18 18   Temp: 98.1 F (36.7 C) 97.9 F (36.6 C) 98.6 F (37 C) 98.3 F (36.8 C)  TempSrc: Oral Oral Oral Oral  SpO2: 98% 98% 98% 95%  Weight: 81.4 kg   79.3 kg  Height:       Weight change: -2.087 kg  Intake/Output Summary (Last 24 hours) at 09/15/2017 0721 Last data filed at 09/15/2017 0440 Gross per 24 hour  Intake 720 ml  Output 3300 ml  Net -2580 ml   BP 126/89 (BP Location: Right Arm)   Pulse 80   Temp 98.3 F (36.8 C) (Oral)   Resp 18   Ht 5\' 2"  (1.575 m)   Wt 79.3 kg Comment: scale a  SpO2 95%   BMI 31.97 kg/m    General appearance: Alert, lying in bed watching TV, accompanied by two sleeping family members.  Lungs: Bibasilar rales. Comfortable work of breathing on room air.  Heart: Regular rate and rhythm, no murmur appreciated. Distal pulses intact. JVD measured to be +8 cm.  Extremities: Unna boot dressings in place; 1+ pitting edema appreciated on bilateral lower extremities.   Lab Results: BMP Latest Ref Rng & Units 09/15/2017 09/14/2017 09/13/2017  Glucose 70 - 99 mg/dL 99 95 80  BUN 8 - 23 mg/dL 17 15 16   Creatinine 0.44 - 1.00 mg/dL 1.40(H) 1.30(H) 1.18(H)  BUN/Creat Ratio 12 - 28 - - -  Sodium 135 - 145 mmol/L 144 142 142  Potassium 3.5 - 5.1 mmol/L 3.9 3.7 4.2  Chloride 98 - 111 mmol/L 111 108 111  CO2 22 - 32 mmol/L 27 25 24   Calcium 8.9 - 10.3 mg/dL 8.7(L) 8.7(L) 8.7(L)   Micro Results: No results found for  this or any previous visit (from the past 240 hour(s)). Studies/Results: No results found. Medications: I have reviewed the patient's current medications. Scheduled Meds: . aspirin EC  81 mg Oral Daily  . atorvastatin  80 mg Oral Daily  . carvedilol  25 mg Oral BID WC  . enoxaparin (LOVENOX) injection  40 mg Subcutaneous Q24H  . furosemide  40 mg Intravenous BID  . insulin aspart  0-15 Units Subcutaneous TID WC  . insulin aspart  0-5 Units Subcutaneous QHS  . lisinopril  40 mg Oral Daily  . spironolactone  25 mg Oral Daily   Continuous Infusions: PRN Meds:. Assessment/Plan: Active Problems:   Acute exacerbation of CHF (congestive heart failure) (HCC)   Acute on chronic systolic heart failure (HCC)   Left bundle branch block   Type II diabetes mellitus, well controlled [facility])  [name] is a 67 yo woman with hx of CAD s/p CABG with 5 stents placed in 2016, HFrEF, HTN and T2DM who was admitted on 8/15 due progressive dyspnea consistent with an acute heart failure exacerbation. She is responding well to IV diuresis.   Acute Exacerbation of Systolic Heart Failure Dyspnea improved with diuresis.  HFrEF with current EF of 25%. Weight loss of 3.6 kg with IV lasix since admission. JVD and pitting edema still appreciated indicating she has not yet reached euvolemia.  - Continue IV Lasix 40 mg BID   - am BMP to monitor creatinine   HTN Normotensive on home regimen of Coreg 25 mg BID, Lisinopril 40 mg, and spironolactone 25 mg.  - Continue home antihypertensives  CAD s/p CABG and 5 stents in 2016  Denies chest pain.  - Continue Aspirin 81 mg and Atorvastatin 80 mg   T2DM  On Metformin at home with A1C in May of 7.0  - Continue sliding scale insulin   Diet: Heart Healthy, fluid restriction of 1800 mL  DVT Prophylaxis: Lovenox  Code Status: Full   This is a Careers information officer Note.  The care of the patient was discussed with Dr. Tarri Abernethy and the assessment and plan formulated with  their assistance.  Please see their attached note for official documentation of the daily encounter.   LOS: 3 days   Cheyeanne Roadcap, Annetta Maw, Medical Student 09/15/2017, 7:21 AM

## 2017-09-16 ENCOUNTER — Telehealth: Payer: Self-pay | Admitting: Internal Medicine

## 2017-09-16 LAB — MAGNESIUM: Magnesium: 1.8 mg/dL (ref 1.7–2.4)

## 2017-09-16 LAB — GLUCOSE, CAPILLARY
GLUCOSE-CAPILLARY: 77 mg/dL (ref 70–99)
GLUCOSE-CAPILLARY: 187 mg/dL — AB (ref 70–99)
GLUCOSE-CAPILLARY: 185 mg/dL — AB (ref 70–99)
Glucose-Capillary: 170 mg/dL — ABNORMAL HIGH (ref 70–99)

## 2017-09-16 LAB — BASIC METABOLIC PANEL
ANION GAP: 6 (ref 5–15)
BUN: 15 mg/dL (ref 8–23)
CALCIUM: 8.5 mg/dL — AB (ref 8.9–10.3)
CHLORIDE: 107 mmol/L (ref 98–111)
CO2: 29 mmol/L (ref 22–32)
Creatinine, Ser: 1.31 mg/dL — ABNORMAL HIGH (ref 0.44–1.00)
GFR calc non Af Amer: 41 mL/min — ABNORMAL LOW (ref >60)
GFR, EST AFRICAN AMERICAN: 48 mL/min — AB (ref >60)
Glucose, Bld: 77 mg/dL (ref 70–99)
POTASSIUM: 4 mmol/L (ref 3.5–5.1)
Sodium: 142 mmol/L (ref 135–145)

## 2017-09-16 MED ORDER — MAGNESIUM SULFATE 2 GM/50ML IV SOLN
2.0000 g | Freq: Once | INTRAVENOUS | Status: AC
  Administered 2017-09-16: 2 g via INTRAVENOUS
  Filled 2017-09-16: qty 50

## 2017-09-16 MED ORDER — SODIUM CHLORIDE 0.9 % IV SOLN
INTRAVENOUS | Status: DC | PRN
  Administered 2017-09-16: 250 mL via INTRAVENOUS

## 2017-09-16 NOTE — Discharge Summary (Signed)
Name: [name] MRN: [id number] DOB: 27-Oct-1950 67 y.o. PCP: Neva Seat, MD  Date of Admission: 09/12/2017 12:25 PM Date of Discharge: 09/17/2017 4:00PM Attending Physician: Oval Linsey, MD  Discharge Diagnosis: 1. Acute exacerbation of systolic heart failure 2. Hypertension 3. CAD with hx of CABG 4. T2DM   Discharge Medications: Allergies as of 09/17/2017   No Known Allergies     Medication List    STOP taking these medications   metFORMIN 500 MG tablet Commonly known as:  GLUCOPHAGE Replaced by:  metFORMIN 500 MG 24 hr tablet     TAKE these medications   aspirin EC 81 MG tablet Take 1 tablet (81 mg total) by mouth daily.   atorvastatin 80 MG tablet Commonly known as:  LIPITOR TAKE ONE TABLET BY MOUTH DAILY   carvedilol 25 MG tablet Commonly known as:  COREG TAKE ONE TABLET BY MOUTH TWICE A DAY WITH A MEAL What changed:  See the new instructions.   furosemide 80 MG tablet Commonly known as:  LASIX Take 1 tablet (80 mg total) by mouth daily.   lisinopril 40 MG tablet Commonly known as:  PRINIVIL,ZESTRIL TAKE ONE TABLET BY MOUTH DAILY What changed:  Another medication with the same name was removed. Continue taking this medication, and follow the directions you see here.   metFORMIN 500 MG 24 hr tablet Commonly known as:  GLUCOPHAGE-XR Take 1 tablet (500 mg total) by mouth daily with breakfast. Replaces:  metFORMIN 500 MG tablet   spironolactone 25 MG tablet Commonly known as:  ALDACTONE Take 1 tablet (25 mg total) by mouth daily.       Disposition and follow-up:   [name] was discharged from [facility] in Stable condition.  At the hospital follow up visit please address:  1.  Heart Failure: presented with dyspnea due to volume overload that improved with multiple days of IV Lasix. Discharge weight of 79.4 kg. She was discharged on PO lasix 80 mg.   2.  Labs / imaging needed at time of follow-up: BMP    3.  Pending labs/ test needing follow-up: N/A   Follow-up Appointments: Follow-up Information    [place]. Go on 09/26/2017.   Why:  Go to Appointment Thursday, August 29th at 3:45 pm  Contact information: [address] [place] Hospital Course by problem list:  Acute Exacerbation of Systolic Heart Failure [name] is a 67 yo woman with hx of HFrEF, CAD s/p CABG with 5 stents placed in 2016, HTN and T2DM who was admitted on 8/15 due progressive dyspnea and orthopnea consistent with an acute heart failure exacerbation. She has known HFrEF and Echo during hospitalization showed current EF of 25%, which is stable compared to Echo in 2016. She had evidence of hypervolemia on exam including rales, bilateral pitting edema and elevated JVD.  Her volume status improved with multiple days of IV Lasix with total diuresis of -4,900 L during hospitalization with weight at discharge of 79.4 kg. Her dyspnea resolved completely with diuresis. She had pitting edema and bibasilar rales on exam at time of discharge,but much improved from admission. She was discharged with oral lasix 80 mg daily.   Hypertension  On first day of hospitalization, she was hypertensive to [id number] systolic while on IV lasix and Carvedilol 25 mg BID. Home spironolactone and lisinopril were initially held due to concerns of multiple nephrotoxic  medications in the setting of starting IV lasix. However, her creatinine was stable after starting IV lasix and her home spironolactone and lisinopril were resumed. She was normotensive while on IV lasix 40mg  daily as well as home meds of carvedilol 25 mg BID, spironolactone 25 mg and lisinopril 40 mg.   CAD s/p CABG and 5 stents in 2016  She denied chest pain during her admission. Her Aspirin 81 mg and Atorvastatin 80 mg were continued over the duration of her hospitalization.   T2DM  On Metformin at home  with A1C in May of 7.0. She was on sliding scale insulin while hospitalized and only required 3-5 U Novolog daily. Repeat A1C during hospitalization was 6.7. She notes some GI side effects on the Metformin 500 mg BID, so she was discharged on Metformin XR 500 mg with plan to increase to Metformin XR 1000 mg if she does not experience any GI side effects in the next week.   Discharge Vitals:   BP 102/72 (BP Location: Right Arm)   Pulse 73   Temp 98.4 F (36.9 C) (Oral)   Resp 18   Ht 5\' 2"  (1.575 m)   Wt 79.4 kg   SpO2 99%   BMI 32.03 kg/m   Pertinent Labs, Studies, and Procedures:  BMP Latest Ref Rng & Units 09/17/2017 09/16/2017 09/15/2017  Glucose 70 - 99 mg/dL 95 77 99  BUN 8 - 23 mg/dL 17 15 17   Creatinine 0.44 - 1.00 mg/dL 1.34(H) 1.31(H) 1.40(H)  BUN/Creat Ratio 12 - 28 - - -  Sodium 135 - 145 mmol/L 141 142 144  Potassium 3.5 - 5.1 mmol/L 3.9 4.0 3.9  Chloride 98 - 111 mmol/L 108 107 111  CO2 22 - 32 mmol/L 27 29 27   Calcium 8.9 - 10.3 mg/dL 8.3(L) 8.5(L) 8.7(L)   BNP 8/15: 2171   A1C: 6.7   Echo: 8/16  - Left ventricle: The cavity size was normal. Wall thickness was   normal. Akinetic apex and septal wall. Inferior akinesis. Lateral   wall relatively preserved. The estimated ejection fraction was   25%. Doppler parameters are consistent with abnormal left   ventricular relaxation (grade 1 diastolic dysfunction). - Aortic valve: There was no stenosis. - Mitral valve: There was moderate regurgitation. - Left atrium: The atrium was moderately dilated. - Right ventricle: The cavity size was normal. Systolic function   was mildly to moderately reduced. - Tricuspid valve: There was mild-moderate regurgitation. Peak   RV-RA gradient (S): 40 mm Hg. - Pulmonary arteries: PA peak pressure: 48 mm Hg (S). - Systemic veins: IVC measured 2.2 cm with > 50% respirophasic   variation, suggesting RA pressure 8 mmHg. - Pericardium, extracardiac: Pleural effusion noted.   EKG: Normal  sinus rhythm; LBBB noted at baseline   Discharge Instructions: Discharge Instructions    Call MD for:  difficulty breathing, headache or visual disturbances   Complete by:  As directed    Call MD for:  extreme fatigue   Complete by:  As directed    Call MD for:  persistant dizziness or light-headedness   Complete by:  As directed    Call MD for:  persistant nausea and vomiting   Complete by:  As directed    Call MD for:  severe uncontrolled pain   Complete by:  As directed    Diet - low sodium heart healthy   Complete by:  As directed    Increase activity slowly   Complete by:  As directed  Ms. Demitria, Hay were hospitalized at [facility] for symptoms related to heart failure including volume overload that was causing your shortness of breath.   We had you on a medicine to help remove some of this extra fluid and kept you on the rest of your normal home medications.   START taking Lasix 80 mg daily START taking Metformin XR 500 mg daily; if you do not experience any upset stomach or diarrhea in the first week, then START taking Metformin XR 1000 mg daily.   KEEP taking spironolactone 25 mg daily  KEEP taking carvedilol 25 mg twice a day  KEEP taking lisinopril 40 mg daily  KEEP taking atorvastatin 80 mg daily  KEEP taking aspirin 81 mg daily   Please call our clinic if you have any questions or concerns, we may be able to help and keep you from a long and expensive emergency room wait. Our clinic and after hours phone number is [phone], the best time to call is Monday through Friday 9 am to 4 pm but there is always someone available 24/7 if you have an emergency. If you need medication refills please notify your pharmacy one week in advance and they will send [place] a request.   Signed: Gilberto Better, PGY1 Pager: [phone]

## 2017-09-16 NOTE — Progress Notes (Signed)
Internal Medicine Attending  Date: 09/16/2017  Patient name: [name] Medical record number: [id number] Date of birth: 1950-03-08 Age: 67 y.o. Gender: female  I saw and evaluated the patient. I reviewed the medical student's/resident's note by Ms. Hamilton/Dr. Truman Hayward and I agree with the medical student's/resident's findings and plans as documented in their progress note.  When seen on rounds this morning [name] was feeling well and was without complaints. She stated her breathing was markedly improved since admission and the Unna boots were helping with her lower extremity edema. She continues to diurese, although her weight has stabilized over the last 24 hours. On examination she has been basilar rales and I believe still has some volume in which to remove. Fortunately, her creatinine has fallen from 1.4 to 1.3 over the last 24 hours and can tolerate additional IV diuresis. We will therefore continue with her current regimen and reassess her volume status and renal function tomorrow.

## 2017-09-16 NOTE — Care Management Important Message (Signed)
Important Message  Patient Details  Name: [name] MRN: [id number] Date of Birth: 1950-06-17   Medicare Important Message Given:  Yes    [name] 09/16/2017, 1:48 PM

## 2017-09-16 NOTE — Telephone Encounter (Signed)
Hospital f/u per Dr Truman Hayward; pt 08/29 345pm/NW

## 2017-09-16 NOTE — Evaluation (Signed)
Physical Therapy Evaluation Patient Details Name: [name] MRN: [id number] DOB: 17-Mar-1950 Today's Date: 09/16/2017   History of Present Illness  Pt is a 67 year old female with PMH of CAD s/p prior CABG and 5 stents, chronic systolic heart failure with a left ventricular EF of 25-30% in 2016, essential HTN, DM, and GERD. She presented with a three-week history of accelerated DOE and a one-day history of orthopnea.     Clinical Impression  Pt admitted with above diagnosis. Pt currently with functional limitations due to the deficits listed below (see PT Problem List). PTA pt independent and ambulatory community distances. On eval, she required supervision transfers and ambulation 300 feet. No SOB noted. SpO2 95% on RA. Pt will benefit from skilled PT to increase their independence and safety with mobility to allow discharge to the venue listed below.  PT to follow acutely. No follow up services indicated.     Follow Up Recommendations No PT follow up    Equipment Recommendations  None recommended by PT    Recommendations for Other Services       Precautions / Restrictions Precautions Precautions: None      Mobility  Bed Mobility Overal bed mobility: Modified Independent                Transfers Overall transfer level: Needs assistance Equipment used: None Transfers: Sit to/from Stand;Stand Pivot Transfers Sit to Stand: Supervision Stand pivot transfers: Supervision       General transfer comment: supervision for safety, no physical assist  Ambulation/Gait Ambulation/Gait assistance: Supervision Gait Distance (Feet): 300 Feet Assistive device: None Gait Pattern/deviations: Step-through pattern;Decreased stride length Gait velocity: mildly decreased Gait velocity interpretation: 1.31 - 2.62 ft/sec, indicative of limited community ambulator General Gait Details: steady gait. No SOB noted. SpO2 95% during ambulation on RA.   Stairs             Wheelchair Mobility    Modified Rankin (Stroke Patients Only)       Balance Overall balance assessment: Mild deficits observed, not formally tested                                           Pertinent Vitals/Pain Pain Assessment: No/denies pain    Home Living Family/patient expects to be discharged to:: Private residence Living Arrangements: Children(daughter) Available Help at Discharge: Family;Available 24 hours/day Type of Home: Apartment Home Access: Level entry     Home Layout: Bed/bath upstairs;Two level Home Equipment: None      Prior Function Level of Independence: Independent         Comments: able to walk community distances     Hand Dominance        Extremity/Trunk Assessment   Upper Extremity Assessment Upper Extremity Assessment: Overall WFL for tasks assessed    Lower Extremity Assessment Lower Extremity Assessment: Overall WFL for tasks assessed    Cervical / Trunk Assessment Cervical / Trunk Assessment: Kyphotic  Communication   Communication: No difficulties  Cognition Arousal/Alertness: Awake/alert Behavior During Therapy: WFL for tasks assessed/performed Overall Cognitive Status: Within Functional Limits for tasks assessed                                        General Comments      Exercises     Assessment/Plan  PT Assessment Patient needs continued PT services  PT Problem List Decreased mobility;Cardiopulmonary status limiting activity;Decreased activity tolerance       PT Treatment Interventions Therapeutic activities;Gait training;Patient/family education;Therapeutic exercise;Stair training;Functional mobility training;Balance training    PT Goals (Current goals can be found in the Care Plan section)  Acute Rehab PT Goals Patient Stated Goal: home PT Goal Formulation: With patient Time For Goal Achievement: 09/30/17 Potential to Achieve Goals: Good    Frequency Min  3X/week   Barriers to discharge        Co-evaluation               AM-PAC PT "6 Clicks" Daily Activity  Outcome Measure Difficulty turning over in bed (including adjusting bedclothes, sheets and blankets)?: None Difficulty moving from lying on back to sitting on the side of the bed? : None Difficulty sitting down on and standing up from a chair with arms (e.g., wheelchair, bedside commode, etc,.)?: None Help needed moving to and from a bed to chair (including a wheelchair)?: None Help needed walking in hospital room?: None Help needed climbing 3-5 steps with a railing? : A Little 6 Click Score: 23    End of Session   Activity Tolerance: Patient tolerated treatment well Patient left: in chair;with call bell/phone within reach Nurse Communication: Mobility status PT Visit Diagnosis: Difficulty in walking, not elsewhere classified (R26.2)    Time: 6815-9470 PT Time Calculation (min) (ACUTE ONLY): 18 min   Charges:   PT Evaluation $PT Eval Low Complexity: 1 Low          Lorrin Goodell, PT  Office # [phone] Pager [phone]   Lorriane Shire 09/16/2017, 1:48 PM

## 2017-09-16 NOTE — Progress Notes (Addendum)
Subjective: [name] is feeling well this morning. She continues to urinate frequently with the IV lasix. She has not had any shortness of breath at rest or when walking to the bathroom. She feels like her leg swelling is improving with the Unna boots.   Objective: Vital signs in last 24 hours: Vitals:   09/15/17 0419 09/15/17 1204 09/15/17 2056 09/16/17 0433  BP: 126/89 102/65 109/69 127/77  Pulse: 80 73 74 76  Resp: 18 20 18 18   Temp: 98.3 F (36.8 C) 98.2 F (36.8 C) 98.4 F (36.9 C) 99.1 F (37.3 C)  TempSrc: Oral Oral Oral Oral  SpO2: 95% 98% 96% 95%  Weight: 79.3 kg   79.7 kg  Height:       Weight change: 0.363 kg  Intake/Output Summary (Last 24 hours) at 09/16/2017 1022 Last data filed at 09/16/2017 0859 Gross per 24 hour  Intake 960 ml  Output 1600 ml  Net -640 ml   BP 127/77 (BP Location: Left Arm)   Pulse 76   Temp 99.1 F (37.3 C) (Oral)   Resp 18   Ht 5\' 2"  (1.575 m)   Wt 79.7 kg   SpO2 95%   BMI 32.12 kg/m  General appearance: Alert, lying in bed at 30 degree angle eating breakfast  Lungs: Bibasilar rales  Heart: regular rate and rhythm, S1, S2 normal, no murmur, click, rub or gallop Extremities: Unna boots in place; 1+ pitting edema bilaterally   Lab Results: BMP Latest Ref Rng & Units 09/16/2017 09/15/2017 09/14/2017  Glucose 70 - 99 mg/dL 77 99 95  BUN 8 - 23 mg/dL 15 17 15   Creatinine 0.44 - 1.00 mg/dL 1.31(H) 1.40(H) 1.30(H)  BUN/Creat Ratio 12 - 28 - - -  Sodium 135 - 145 mmol/L 142 144 142  Potassium 3.5 - 5.1 mmol/L 4.0 3.9 3.7  Chloride 98 - 111 mmol/L 107 111 108  CO2 22 - 32 mmol/L 29 27 25   Calcium 8.9 - 10.3 mg/dL 8.5(L) 8.7(L) 8.7(L)    Micro Results: No results found for this or any previous visit (from the past 240 hour(s)). Studies/Results: No results found. Medications: I have reviewed the patient's current medications. Scheduled Meds: . aspirin EC  81 mg Oral Daily  . atorvastatin  80 mg Oral Daily  . carvedilol  25 mg  Oral BID WC  . enoxaparin (LOVENOX) injection  40 mg Subcutaneous Q24H  . furosemide  40 mg Intravenous Daily  . insulin aspart  0-15 Units Subcutaneous TID WC  . insulin aspart  0-5 Units Subcutaneous QHS  . lisinopril  40 mg Oral Daily  . spironolactone  25 mg Oral Daily   Assessment/Plan: Principal Problem:   Acute on chronic systolic heart failure (HCC) Active Problems:   Acute exacerbation of CHF (congestive heart failure) (HCC)   Left bundle branch block   Type II diabetes mellitus, well controlled [facility])  [name] is a 67 yo woman with hx of HFrEF, CAD s/p CABG with 5 stents placed in 2016, HTN and T2DM who was admitted on 8/15 due progressive dyspnea consistent with an acute heart failure exacerbation.   Acute Exacerbation of Systolic Heart Failure Denies dyspnea. Responding well to IV Lasix with urine output of 1 L in last 24 hours, but continued signs of hypervolemia including rales and pitting edema on exam. Hx of HFrEF with current EF of 25%, which is stable compared to Echo in 2016. Creatinine had increased slightly to 1.4, but downtrending to 1.3 today.  Magnesium 1.8 - Continue IV Lasix 40 mg daily   - Mag sulfate 2g to keep Mag over 2 in HFrEF <30% - am BMP to monitor creatinine   HTN Normotensive over past 24 hours with addition of IV lasix to home regimen of Coreg 25 mg BID, Lisinopril 40 mg, and spironolactone 25 mg.   - Continue home antihypertensives  CAD s/p CABG and 5 stents in 2016  - Continue Aspirin 81 mg and Atorvastatin 80 mg   T2DM  On Metformin at home with A1C in May of 7.0  - Continue sliding scale insulin   Diet: Heart Healthy, fluid restriction of 1800 mL  DVT Prophylaxis: Lovenox  Code Status: Full   This is a Careers information officer Note.  The care of the patient was discussed with Dr. Truman Hayward and the assessment and plan formulated with their assistance.  Please see their attached note for official documentation of the daily encounter.   LOS: 4  days   Hamiltion, Annetta Maw, Medical Student 09/16/2017, 10:22 AM  Attestation for Student Documentation:  I personally was present and performed or re-performed the history, physical exam and medical decision-making activities of this service and have verified that the service and findings are accurately documented in the student's note.  [name] is a 67 yo F w/ PMH of CAD s/p CABG x5, CHF, T2DM, and HTN admit for progressive dyspnea and edema diagnosed with acute on chronic heart failure exacerbation on hospital day 5. Continuing gentle diuresis for HFrEF exacerbation. Still fluid overloaded. Giving Lasix at lower dose 2/2 rising creatinine. Blood pressure better controlled on home meds  Mosetta Anis, MD 09/16/2017, 2:44 PM

## 2017-09-17 LAB — BASIC METABOLIC PANEL
Anion gap: 6 (ref 5–15)
BUN: 17 mg/dL (ref 8–23)
CALCIUM: 8.3 mg/dL — AB (ref 8.9–10.3)
CO2: 27 mmol/L (ref 22–32)
Chloride: 108 mmol/L (ref 98–111)
Creatinine, Ser: 1.34 mg/dL — ABNORMAL HIGH (ref 0.44–1.00)
GFR calc Af Amer: 46 mL/min — ABNORMAL LOW (ref >60)
GFR, EST NON AFRICAN AMERICAN: 40 mL/min — AB (ref >60)
GLUCOSE: 95 mg/dL (ref 70–99)
Potassium: 3.9 mmol/L (ref 3.5–5.1)
SODIUM: 141 mmol/L (ref 135–145)

## 2017-09-17 LAB — HEMOGLOBIN A1C
Hgb A1c MFr Bld: 6.7 % — ABNORMAL HIGH (ref 4.8–5.6)
MEAN PLASMA GLUCOSE: 145.59 mg/dL

## 2017-09-17 LAB — MAGNESIUM: MAGNESIUM: 2.2 mg/dL (ref 1.7–2.4)

## 2017-09-17 LAB — GLUCOSE, CAPILLARY
GLUCOSE-CAPILLARY: 237 mg/dL — AB (ref 70–99)
Glucose-Capillary: 96 mg/dL (ref 70–99)

## 2017-09-17 MED ORDER — METFORMIN HCL ER 500 MG PO TB24: 500.0000 mg | ORAL_TABLET | Freq: Every day | ORAL | 0 refills | Status: DC

## 2017-09-17 MED ORDER — FUROSEMIDE 80 MG PO TABS: 80.0000 mg | ORAL_TABLET | Freq: Every day | ORAL | 0 refills | Status: DC

## 2017-09-17 NOTE — Discharge Instructions (Signed)
Ms. Julia, Silva were hospitalized at [facility] for symptoms related to heart failure including volume overload that was causing your shortness of breath.   We had you on a medicine to help remove some of this extra fluid and kept you on the rest of your normal home medications.   START taking Lasix 80 mg daily START taking Metformin XR 500 mg daily; if you do not experience any upset stomach or diarrhea in the first week, then START taking Metformin XR 1000 mg daily.   KEEP taking spironolactone 25 mg daily  KEEP taking carvedilol 25 mg twice a day  KEEP taking lisinopril 40 mg daily  KEEP taking atorvastatin 80 mg daily  KEEP taking aspirin 81 mg daily   Please call our clinic if you have any questions or concerns, we may be able to help and keep you from a long and expensive emergency room wait. Our clinic and after hours phone number is [phone], the best time to call is Monday through Friday 9 am to 4 pm but there is always someone available 24/7 if you have an emergency. If you need medication refills please notify your pharmacy one week in advance and they will send [place] a request.

## 2017-09-17 NOTE — Progress Notes (Signed)
Chaplain received a request from the Nursing Unit to complete this patient's Advance Directive be notarized, as she is being discharged. Chaplain informed the the Nursing Unit that Volunteers that are used to witness Directive leave at 3:30, thus it makes it difficult to find others to assist.  Therefore, Chaplain directed the staff to have the patient to make an appointment with the Spiritual Care for an appointment to Advance Directive witnessed, and notarized.  Chaplain Yaakov Guthrie [phone]

## 2017-09-17 NOTE — Progress Notes (Signed)
Internal Medicine Attending  Date: 09/17/2017  Patient name: [name] Medical record number: [id number] Date of birth: Nov 20, 1950 Age: 67 y.o. Gender: female  I saw and evaluated the patient. I reviewed the medical student's/resident's note by Ms. Hamilton/Dr. Truman Hayward and I agree with the medical student's/resident's findings and plans as documented in their progress note.  When seen on rounds this morning [name] was feeling well. She had no complaints of dyspnea and was sleeping without difficulty. She feels much improved from a volume standpoint, especially in her legs. Her creatinine has not bumped despite continued IV diuresis. That said, her urine output was minimal over the last 24 hours when compared to her input.  Overall, she lost 3.5 kg since admission and is felt to be stable for transition to oral diuretics and discharged home. She will require close follow-up in the Internal Medicine Center to assure she is not over diuresed on the Lasix 80 mg by mouth daily on which she is being discharged.

## 2017-09-17 NOTE — Progress Notes (Addendum)
Subjective: [name] is feeling very well this morning. She has not had any shortness of breath at rest or when walking. She says she has still been urinating frequently due to Lasix. She says her legs feel lighter than yesterday.   Objective: Vital signs in last 24 hours: Vitals:   09/15/17 2056 09/16/17 0433 09/16/17 1955 09/17/17 0448  BP: 109/69 127/77 109/77 118/75  Pulse: 74 76 83 75  Resp: 18 18 20 18   Temp: 98.4 F (36.9 C) 99.1 F (37.3 C) 98.5 F (36.9 C) 98.3 F (36.8 C)  TempSrc: Oral Oral Oral Oral  SpO2: 96% 95% 97% 96%  Weight:  79.7 kg  79.4 kg  Height:       Weight change: -0.227 kg  Intake/Output Summary (Last 24 hours) at 09/17/2017 1041 Last data filed at 09/17/2017 1001 Gross per 24 hour  Intake 496.12 ml  Output 1100 ml  Net -603.88 ml   BP 118/75 (BP Location: Right Arm)   Pulse 75   Temp 98.3 F (36.8 C) (Oral)   Resp 18   Ht 5\' 2"  (1.575 m)   Wt 79.4 kg   SpO2 96%   BMI 32.03 kg/m  General appearance: Alert, sitting up on the side of bed eating eating breakfast  Lungs: Bibasilar rales  Heart: regular rate and rhythm, S1, S2 normal, no murmur, click, rub or gallop Extremities: Unna boots in place; 1+ pitting edema bilaterally  Lab Results: A1C: 6.7   BMP Latest Ref Rng & Units 09/17/2017 09/16/2017 09/15/2017  Glucose 70 - 99 mg/dL 95 77 99  BUN 8 - 23 mg/dL 17 15 17   Creatinine 0.44 - 1.00 mg/dL 1.34(H) 1.31(H) 1.40(H)  BUN/Creat Ratio 12 - 28 - - -  Sodium 135 - 145 mmol/L 141 142 144  Potassium 3.5 - 5.1 mmol/L 3.9 4.0 3.9  Chloride 98 - 111 mmol/L 108 107 111  CO2 22 - 32 mmol/L 27 29 27   Calcium 8.9 - 10.3 mg/dL 8.3(L) 8.5(L) 8.7(L)    Micro Results: No results found for this or any previous visit (from the past 240 hour(s)). Studies/Results: No results found. Medications: I have reviewed the patient's current medications. Scheduled Meds: . aspirin EC  81 mg Oral Daily  . atorvastatin  80 mg Oral Daily  . carvedilol  25  mg Oral BID WC  . enoxaparin (LOVENOX) injection  40 mg Subcutaneous Q24H  . furosemide  40 mg Intravenous Daily  . insulin aspart  0-15 Units Subcutaneous TID WC  . insulin aspart  0-5 Units Subcutaneous QHS  . lisinopril  40 mg Oral Daily  . spironolactone  25 mg Oral Daily   Continuous Infusions: . sodium chloride 250 mL (09/16/17 1437)   PRN Meds:.sodium chloride Assessment/Plan: Principal Problem:   Acute on chronic systolic heart failure (HCC) Active Problems:   Acute exacerbation of CHF (congestive heart failure) (HCC)   Left bundle branch block   Type II diabetes mellitus, well controlled [facility])  Ms. Filosa is a 67 yo woman with hx of CAD s/p CABG with 5 stents placed in 2016, HFrEF, HTN and T2DM who was admitted on 8/15 due progressive dyspnea consistent with an acute heart failure exacerbation.   Acute Exacerbation of Systolic Heart Failure Dyspnea resolved with diuresis. HFrEF with current EF of 25% (stable since Echo in 2016).  Weight loss of 3.5 kg since admission with weight today of 79.4 kg. Exam stills shows some evidence of extravascular fluid overload, but is improving. BUN  has increased minimally indicating that we have not yet reached euvolemia, but clinically she is improved with Lasix.  - Continue IV Lasix 40 mg   HTN Continues to be normotensive on home regimen of Coreg 25 mg BID, Lisinopril 40 mg, and spironolactone 25 mg.  - Continue home antihypertensives  CAD s/p CABG and 5 stents in 2016  - Continue Aspirin 81 mg and Atorvastatin 80 mg   T2DM  On Metformin 500 BID at home with A1C in May of 7.0. Repeat A1C of 6.7 during hospitalization.  - Continue sliding scale insulin while hospitalized with plan to switch to Metformin XR at discharge  Diet: Heart Healthy, fluid restriction of 1800 mL  DVT Prophylaxis: Lovenox  Code Status: Full   This is a Careers information officer Note.  The care of the patient was discussed with Dr. Truman Hayward and the assessment and plan  formulated with their assistance.  Please see their attached note for official documentation of the daily encounter.   LOS: 5 days   Hamiltion, Annetta Maw, Medical Student 09/17/2017, 10:41 AM  Attestation for Student Documentation:  I personally was present and performed or re-performed the history, physical exam and medical decision-making activities of this service and have verified that the service and findings are accurately documented in the student's note.  JuliaJulia Silva is a 67 yo F w/ CAD s/p CABG x5, HTN, HLD, DM admit for acute on chronic HFrEF presenting with lower extremity edema and dyspnea. Patient was offered biventricular pacing with ICD and CRT during this admission but patient declined. Today, she still appear hypervolemic on exam but stable creatinine on IV lasix 40mg . Will discharge on PO lasix 80mg  with follow up with outpatient for further assessment.  Mosetta Anis, MD 09/17/2017, 11:40 AM  Pager: [phone]

## 2017-09-17 NOTE — Progress Notes (Signed)
Pt has orders to be discharged. Discharge instructions given and pt has no additional questions at this time. Medication regimen reviewed and pt educated. Pt verbalized understanding and has no additional questions. Telemetry box removed. IV removed and site in good condition. Pt stable and waiting for chaplain consult for advanced directives.

## 2017-09-19 ENCOUNTER — Encounter: Payer: Medicare Other | Admitting: Internal Medicine

## 2017-09-19 ENCOUNTER — Other Ambulatory Visit: Payer: Self-pay | Admitting: Cardiology

## 2017-09-19 NOTE — Telephone Encounter (Signed)
Rx sent to pharmacy   

## 2017-09-25 ENCOUNTER — Encounter: Payer: Self-pay | Admitting: Internal Medicine

## 2017-09-25 ENCOUNTER — Other Ambulatory Visit: Payer: Self-pay | Admitting: Internal Medicine

## 2017-09-25 NOTE — Progress Notes (Signed)
   CC: CHF, Hypertension, Diabetes, Preventative health care  HPI:  [name] is a 67 y.o. F with PMHx listed below presenting for CHF, Hypertension, Diabetes, Preventative health care. Please see the A&P for the status of the patient's chronic medical problems.   Patient was recently admitted from 8/16 - 8/20 for CHF exacerbation. Patient presented with 3 weeks of worsening DOE and edema. She was treated with IV furosemide and had a total diuresis of 4.9L and had a discharge weight of 79.4 kg. She had some residual rales and pitting edema on day of discharge and was discharged on Furosemide 80mg  PO Daily.   Patient states her symptoms have completely resolved and have not recurred since her discharge. She has been taking her daily lasix and tolerating this well. Her weigh remains stable at 79.1 today.  Past Medical History:  Diagnosis Date  . Acute exacerbation of CHF (congestive heart failure) ([place]) 09/12/2017  . Acute gallstone pancreatitis   . Bilateral pleural effusion 02/22/2014  . Cardiomyopathy, ischemic, EF 25-30% 03/23/2014   03/01/2014- Date of procedure- CABG- X5- Severe 3-vessel coronary artery disease with severe LV dysfunction. Ejection fraction 25-30%. SURGICAL PROCEDURE: Coronary artery bypass grafting x 5. SURGEON: Lanelle Bal, MD.    . Chronic systolic heart failure ([place])   . Coronary artery disease involving native coronary artery of native heart without angina pectoris   . Diabetes mellitus 1998   Dx in 1998. Microalbuminuria, never on insulin.  . Diabetes mellitus type 2, controlled, without complications ([place]) 74/0/8144   Last A1C 6.7 (03/21/17)    On Diet Control Only Last Foot Exam: 03/21/17    Last eye exam: to be scheduled early 2019   . Dyspnea   . Essential hypertension 12/05/2005   Lisinopril 40mg  Daily, Coreg 25mg  BID.   Marland Kitchen GERD 12/05/2005   Qualifier: Diagnosis of  By: Prudencio Burly MD, Phillip Heal    . GERD (gastroesophageal reflux disease)   . History  of blurry vision 06/09   Hospitalized for this  . Hyperlipidemia   . Hypertension   . Personal history of colonic adenomas 10/07/2007  . Pneumonia 01/2014  . 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 Viral URI with cough 03/21/2017   Review of Systems:  Performed and all others negative.  Physical Exam:  Vitals:   09/26/17 1524  BP: 121/63  Pulse: 84  Temp: 98.6 F (37 C)  TempSrc: Oral  SpO2: 100%  Weight: 174 lb 6.4 oz (79.1 kg)   Physical Exam  Constitutional: She appears well-developed and well-nourished. No distress.  Cardiovascular: Normal rate, regular rhythm and normal heart sounds.  Pulmonary/Chest: Effort normal and breath sounds normal. No respiratory distress.  Abdominal: Soft. Bowel sounds are normal. She exhibits no distension. There is no tenderness.  Musculoskeletal:  Bilateral LE pitting edema  Skin: Skin is warm and dry.   Assessment & Plan:   See Encounters Tab for problem based charting.  Patient discussed with Dr. Dareen Piano

## 2017-09-26 ENCOUNTER — Ambulatory Visit (INDEPENDENT_AMBULATORY_CARE_PROVIDER_SITE_OTHER): Payer: Medicare Other | Admitting: Internal Medicine

## 2017-09-26 ENCOUNTER — Encounter: Payer: Self-pay | Admitting: Internal Medicine

## 2017-09-26 ENCOUNTER — Other Ambulatory Visit: Payer: Self-pay

## 2017-09-26 VITALS — BP 121/63 | HR 84 | Temp 98.6°F | Wt 174.4 lb

## 2017-09-26 DIAGNOSIS — I5023 Acute on chronic systolic (congestive) heart failure: Secondary | ICD-10-CM | POA: Diagnosis not present

## 2017-09-26 DIAGNOSIS — Z7982 Long term (current) use of aspirin: Secondary | ICD-10-CM | POA: Diagnosis not present

## 2017-09-26 DIAGNOSIS — Z7984 Long term (current) use of oral hypoglycemic drugs: Secondary | ICD-10-CM

## 2017-09-26 DIAGNOSIS — Z79899 Other long term (current) drug therapy: Secondary | ICD-10-CM

## 2017-09-26 DIAGNOSIS — Z23 Encounter for immunization: Secondary | ICD-10-CM | POA: Diagnosis not present

## 2017-09-26 DIAGNOSIS — I5022 Chronic systolic (congestive) heart failure: Secondary | ICD-10-CM

## 2017-09-26 DIAGNOSIS — E119 Type 2 diabetes mellitus without complications: Secondary | ICD-10-CM

## 2017-09-26 DIAGNOSIS — I110 Hypertensive heart disease with heart failure: Secondary | ICD-10-CM

## 2017-09-26 DIAGNOSIS — I10 Essential (primary) hypertension: Secondary | ICD-10-CM

## 2017-09-26 DIAGNOSIS — E2839 Other primary ovarian failure: Secondary | ICD-10-CM

## 2017-09-26 DIAGNOSIS — Z0000 Encounter for general adult medical examination without abnormal findings: Secondary | ICD-10-CM

## 2017-09-26 MED ORDER — METFORMIN HCL ER 500 MG PO TB24: 1000.0000 mg | ORAL_TABLET | Freq: Every day | ORAL | 1 refills | Status: DC

## 2017-09-26 NOTE — Assessment & Plan Note (Addendum)
Recent Hgb A1c 6.7 while admitted. Last A1c was 7. She was on diet control and metformin 500 BID. She expereinced some diarrhea so she was transitioned to Metformin 500mg  XR while inpatient. She states that she has not experienced any diarrhea on this dose. We will increase her dose today and monitor for recurrence of diarrhea.  - Increase Metformin XR to 1000mg  Daily - Continue lifestyle modifications - Patient has eye doctor and will schedule diabetic eye exam - Follow up in 3 months

## 2017-09-26 NOTE — Patient Instructions (Addendum)
Thank you for allowing [place] to care for you  For your Heart failure - You are doing well on your current medications - Follow up with cardiology as scheduled - Please call us if you notice 3-5lb weight gain in 1-3 day period  For your blood pressure: - You blood pressure remains well controlled  For your Diabetes - Your A1c is improving, we have increased your metformin dose - Please have your diabetic eye exam done when you are able  You received your flu vaccine today  A DEXA (bone density) Scan was ordered for you today  Please follow up in about 3 months

## 2017-09-26 NOTE — Telephone Encounter (Signed)
Dose increased. See today's visit note for details.

## 2017-09-26 NOTE — Assessment & Plan Note (Addendum)
Patient was recently admitted from 8/16 - 8/20 for CHF exacerbation. Patient presented with 3 weeks of worsening DOE and edema. She was treated with IV furosemide and had a total diuresis of 4.9L and had a discharge weight of 79.4 kg. She had some residual rales and pitting edema on day of discharge and was discharged on Furosemide 80mg  PO Daily.  Patient states her symptoms have completely resolved and have not recurred since her discharge. She has been taking her daily lasix and tolerating this well. Her weigh remains stable at 79.1 today.  On exam, patients lungs are now clear and her LE edema has returned to baseline. She states her edema resolves when she puts her legs up.  - Lisinopril 40mg  Daily - Coreg 25mg  BID - Spironolactone 25mg  Daily - ASA 81mg  Daily - Continue Lasix 80mg  Daily - BMP

## 2017-09-27 ENCOUNTER — Encounter: Payer: Self-pay | Admitting: Internal Medicine

## 2017-09-27 LAB — BMP8+ANION GAP
Anion Gap: 16 mmol/L (ref 10.0–18.0)
BUN/Creatinine Ratio: 12 (ref 12–28)
BUN: 17 mg/dL (ref 8–27)
CALCIUM: 9.6 mg/dL (ref 8.7–10.3)
CO2: 25 mmol/L (ref 20–29)
CREATININE: 1.46 mg/dL — AB (ref 0.57–1.00)
Chloride: 101 mmol/L (ref 96–106)
GFR, EST AFRICAN AMERICAN: 43 mL/min/{1.73_m2} — AB (ref >59)
GFR, EST NON AFRICAN AMERICAN: 37 mL/min/{1.73_m2} — AB (ref >59)
Glucose: 135 mg/dL — ABNORMAL HIGH (ref 65–99)
Potassium: 4.7 mmol/L (ref 3.5–5.2)
Sodium: 142 mmol/L (ref 134–144)

## 2017-09-27 NOTE — Assessment & Plan Note (Signed)
BP 121/63 today.BP last visit 129/55. She remains well controlled on current regimen. She has been started on Lasix 80mg  Daily following recent admission for heart failure exacerbation. -Lisinopril 40mg Daily -Coreg 25mg BID - Spironolactone 25mg  Daily - Lasix 80mg  Daily

## 2017-09-27 NOTE — Assessment & Plan Note (Signed)
Flu shot given Dexa ordered

## 2017-09-27 NOTE — Assessment & Plan Note (Signed)
Flu shot given

## 2017-09-27 NOTE — Assessment & Plan Note (Addendum)
Patient was recently admitted from 8/16 - 8/20 for CHF exacerbation. Patient presented with 3 weeks of worsening DOE and edema. She was treated with IV furosemide and had a total diuresis of 4.9L and had a discharge weight of 79.4 kg. She had some residual rales and pitting edema on day of discharge and was discharged on Furosemide 80mg  PO Daily.  Patient states her symptoms have completely resolved and have not recurred since her discharge. She has been taking her daily lasix and tolerating this well. Her weigh remains stable at 79.1 today.  On exam, patients lungs are now clear and her LE edema has returned to baseline. She states her edema resolves when she puts her legs up.  - Lisinopril 40mg  Daily - Coreg 25mg  BID - Spironolactone 25mg  Daily - ASA 81mg  Daily - Continue Lasix 80mg  Daily - BMP  Addendum: BMP showed Cr stable from discharge at 1.46 (1.34), but elevated from previous baseline of ~1.2. This may be due to resumption of ACE-I on discharge in the setting of new diuretic use. Patient was called and given these results, She was instructed to follow up in [facility] for repeat BMP in one week.

## 2017-09-30 NOTE — Progress Notes (Signed)
Internal Medicine Clinic Attending  Case discussed with Dr. Melvin  at the time of the visit.  We reviewed the resident's history and exam and pertinent patient test results.  I agree with the assessment, diagnosis, and plan of care documented in the resident's note.  

## 2017-10-02 NOTE — Telephone Encounter (Signed)
Was present for hosp f/u

## 2017-10-03 ENCOUNTER — Encounter: Payer: Self-pay | Admitting: Internal Medicine

## 2017-10-03 ENCOUNTER — Ambulatory Visit (INDEPENDENT_AMBULATORY_CARE_PROVIDER_SITE_OTHER): Payer: Medicare Other | Admitting: Internal Medicine

## 2017-10-03 VITALS — BP 105/59 | HR 87 | Temp 98.2°F | Wt 173.1 lb

## 2017-10-03 DIAGNOSIS — E119 Type 2 diabetes mellitus without complications: Secondary | ICD-10-CM | POA: Diagnosis not present

## 2017-10-03 DIAGNOSIS — Z7982 Long term (current) use of aspirin: Secondary | ICD-10-CM | POA: Diagnosis not present

## 2017-10-03 DIAGNOSIS — I5022 Chronic systolic (congestive) heart failure: Secondary | ICD-10-CM

## 2017-10-03 DIAGNOSIS — Z79899 Other long term (current) drug therapy: Secondary | ICD-10-CM

## 2017-10-03 DIAGNOSIS — Z7984 Long term (current) use of oral hypoglycemic drugs: Secondary | ICD-10-CM

## 2017-10-03 NOTE — Progress Notes (Signed)
   CC: Chronic Systolic Heart Failure, Diabetes   HPI:  [name] is a 67 y.o. F with PMHx listed below presenting for Chronic Systolic Heart Failure, Diabetes. Please see the A&P for the status of the patient's chronic medical problems.    Past Medical History:  Diagnosis Date  . Acute exacerbation of CHF (congestive heart failure) ([place]) 09/12/2017  . Acute gallstone pancreatitis   . Acute on chronic systolic heart failure ([place])   . Bilateral pleural effusion 02/22/2014  . Cardiomyopathy, ischemic, EF 25-30% 03/23/2014   03/01/2014- Date of procedure- CABG- X5- Severe 3-vessel coronary artery disease with severe LV dysfunction. Ejection fraction 25-30%. SURGICAL PROCEDURE: Coronary artery bypass grafting x 5. SURGEON: Lanelle Bal, MD.    . Chronic systolic heart failure ([place])   . Coronary artery disease involving native coronary artery of native heart without angina pectoris   . Diabetes mellitus 1998   Dx in 1998. Microalbuminuria, never on insulin.  . Diabetes mellitus type 2, controlled, without complications ([place]) 58/05/2776   Last A1C 6.7 (03/21/17)    On Diet Control Only Last Foot Exam: 03/21/17    Last eye exam: to be scheduled early 2019   . Dyspnea   . Essential hypertension 12/05/2005   Lisinopril 40mg  Daily, Coreg 25mg  BID.   Marland Kitchen GERD 12/05/2005   Qualifier: Diagnosis of  By: Prudencio Burly MD, Phillip Heal    . GERD (gastroesophageal reflux disease)   . History of blurry vision 06/09   Hospitalized for this  . Hyperlipidemia   . Hypertension   . Personal history of colonic adenomas 10/07/2007  . Pneumonia 01/2014  . 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 Viral URI with cough 03/21/2017   Review of Systems: Performed and all others negative.  Physical Exam:  Vitals:   10/03/17 1337  BP: (!) 105/59  Pulse: 87  Temp: 98.2 F (36.8 C)  TempSrc: Oral  SpO2: 100%  Weight: 173 lb 1.6 oz (78.5 kg)   Physical Exam  Constitutional: She appears well-developed and well-nourished. No  distress.  Cardiovascular: Normal rate, regular rhythm, normal heart sounds and intact distal pulses.  Pulmonary/Chest: Effort normal and breath sounds normal. No respiratory distress.  Abdominal: Soft. Bowel sounds are normal. She exhibits no distension. There is no tenderness.  Musculoskeletal: She exhibits no deformity.  Mild Bilateral LE pitting edema L>R   Skin: Skin is warm and dry.    Assessment & Plan:   See Encounters Tab for problem based charting.  Patient discussed with Dr. Evette Doffing

## 2017-10-03 NOTE — Assessment & Plan Note (Signed)
Discussed patient's new regimen of Metformin XR 1000mg  Daily. She stated she had been taken this as 500mg  BID. She was advised to take both pills at the same time each day. She denies any GI side effects with her increased dose - Continue Metformin XR to 1000mg  Daily - Continue lifestyle modifications - Follow up in 3 months as scheduled

## 2017-10-03 NOTE — Progress Notes (Signed)
HPI: FU CAD; previously admitted with CHF; echo 1/16 showed EF 25-30, mild to moderate MR, mild LAE/RAE/RVE; moderately reduced RV function; small pericardial effusion. Carotid dopplers 1/16 showed 1-39 bilateral stenosis. Cath revealed 3 vessel CAD and EF 30-35. Had CABG with LIMA to LAD, seq svg to first and second diagonal, svg to OM, svg to PDA. Patient previously referred to electrophysiology for consideration of ICD but patient declined.  Admitted August 2019 with congestive heart failure and diuresed. Last echocardiogram repeated August 2019 and showed ejection fraction 25%, moderate mitral regurgitation, moderate left atrial enlargement, mild to moderate RV dysfunction, mild to moderate tricuspid regurgitation.  Since last seen,   Current Outpatient Medications  Medication Sig Dispense Refill  . aspirin 81 MG tablet Take 1 tablet (81 mg total) by mouth daily.    Marland Kitchen atorvastatin (LIPITOR) 80 MG tablet TAKE ONE TABLET BY MOUTH DAILY 90 tablet 2  . carvedilol (COREG) 25 MG tablet TAKE ONE TABLET BY MOUTH TWICE A DAY WITH A MEAL (Patient taking differently: Take 25 mg by mouth 2 (two) times daily with a meal. ) 180 tablet 2  . furosemide (LASIX) 80 MG tablet Take 1 tablet (80 mg total) by mouth daily. 30 tablet 0  . lisinopril (PRINIVIL,ZESTRIL) 40 MG tablet TAKE ONE TABLET BY MOUTH DAILY 90 tablet 2  . metFORMIN (GLUCOPHAGE XR) 500 MG 24 hr tablet Take 2 tablets (1,000 mg total) by mouth daily with breakfast. 180 tablet 1  . spironolactone (ALDACTONE) 25 MG tablet TAKE ONE TABLET BY MOUTH DAILY 90 tablet 1   No current facility-administered medications for this visit.      Past Medical History:  Diagnosis Date  . Acute exacerbation of CHF (congestive heart failure) ([place]) 09/12/2017  . Acute gallstone pancreatitis   . Acute on chronic systolic heart failure ([place])   . Bilateral pleural effusion 02/22/2014  . Cardiomyopathy, ischemic, EF 25-30% 03/23/2014   03/01/2014- Date of  procedure- CABG- X5- Severe 3-vessel coronary artery disease with severe LV dysfunction. Ejection fraction 25-30%. SURGICAL PROCEDURE: Coronary artery bypass grafting x 5. SURGEON: Lanelle Bal, MD.    . Chronic systolic heart failure ([place])   . Coronary artery disease involving native coronary artery of native heart without angina pectoris   . Diabetes mellitus 1998   Dx in 1998. Microalbuminuria, never on insulin.  . Diabetes mellitus type 2, controlled, without complications ([place]) 32/06/7122   Last A1C 6.7 (03/21/17)    On Diet Control Only Last Foot Exam: 03/21/17    Last eye exam: to be scheduled early 2019   . Dyspnea   . Essential hypertension 12/05/2005   Lisinopril 40mg  Daily, Coreg 25mg  BID.   Marland Kitchen GERD 12/05/2005   Qualifier: Diagnosis of  By: Prudencio Burly MD, Phillip Heal    . GERD (gastroesophageal reflux disease)   . History of blurry vision 06/09   Hospitalized for this  . Hyperlipidemia   . Hypertension   . Personal history of colonic adenomas 10/07/2007  . Pneumonia 01/2014  . 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 Viral URI with cough 03/21/2017    Past Surgical History:  Procedure Laterality Date  . CHOLECYSTECTOMY N/A 08/06/2016   Procedure: LAPAROSCOPIC CHOLECYSTECTOMY WITH INTRAOPERATIVE CHOLANGIOGRAM;  Surgeon: Georganna Skeans, MD;  Location: [place];  Service: General;  Laterality: N/A;  . CORONARY ARTERY BYPASS GRAFT N/A 03/01/2014   Procedure: CORONARY ARTERY BYPASS GRAFTING (CABG) x five, using left internal mammary artery and right leg greater saphenous vein harvested endoscopically;  Surgeon: Grace Isaac, MD;  Location: [place];  Service: Open Heart Surgery;  Laterality: N/A;  . INTRAOPERATIVE TRANSESOPHAGEAL ECHOCARDIOGRAM N/A 03/01/2014   Procedure: INTRAOPERATIVE TRANSESOPHAGEAL ECHOCARDIOGRAM;  Surgeon: Grace Isaac, MD;  Location: [facility];  Service: Open Heart Surgery;  Laterality: N/A;  . LEFT AND RIGHT HEART CATHETERIZATION WITH CORONARY ANGIOGRAM N/A 02/26/2014   Procedure:  LEFT AND RIGHT HEART CATHETERIZATION WITH CORONARY ANGIOGRAM;  Surgeon: Sinclair Grooms, MD;  Location: [facility] CATH LAB;  Service: Cardiovascular;  Laterality: N/A;  . TOTAL ABDOMINAL HYSTERECTOMY W/ BILATERAL SALPINGOOPHORECTOMY  1996    Social History   Socioeconomic History  . Marital status: Widowed    Spouse name: Not on file  . Number of children: Not on file  . Years of education: Not on file  . Highest education level: Not on file  Occupational History  . Occupation: Automotive engineer  Social Needs  . Financial resource strain: Not on file  . Food insecurity:    Worry: Not on file    Inability: Not on file  . Transportation needs:    Medical: Not on file    Non-medical: Not on file  Tobacco Use  . Smoking status: Never Smoker  . Smokeless tobacco: Never Used  Substance and Sexual Activity  . Alcohol use: No    Alcohol/week: 0.0 standard drinks  . Drug use: No  . Sexual activity: Not on file  Lifestyle  . Physical activity:    Days per week: Not on file    Minutes per session: Not on file  . Stress: Not on file  Relationships  . Social connections:    Talks on phone: Not on file    Gets together: Not on file    Attends religious service: Not on file    Active member of club or organization: Not on file    Attends meetings of clubs or organizations: Not on file    Relationship status: Not on file  . Intimate partner violence:    Fear of current or ex partner: Not on file    Emotionally abused: Not on file    Physically abused: Not on file    Forced sexual activity: Not on file  Other Topics Concern  . Not on file  Social History Narrative   Married x 42 yrs.  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   Never smoked. Doesn't drink or use drugs.   Does Patient Exercise:  yes - sometimes      To get diabetes supplies for [name] RD, CDE, April 20,2011    Family History  Problem Relation  Age of Onset  . Diabetes Mother        Mother died of MI at age 68  . Heart failure Mother   . Kidney failure Mother   . Heart failure Sister        Died  . Heart failure Brother        Died  . Diabetes Brother        Died    ROS: no fevers or chills, productive cough, hemoptysis, dysphasia, odynophagia, melena, hematochezia, dysuria, hematuria, rash, seizure activity, orthopnea, PND, claudication. Remaining systems are negative.  Physical Exam: Well-developed well-nourished in no acute distress.  Skin is warm and dry.  HEENT is normal.  Neck is supple.  Chest is clear to auscultation with normal expansion.  Cardiovascular exam is regular rate and rhythm.  Abdominal exam  nontender or distended. No masses palpated. Extremities show trace to 1+ edema. neuro grossly intact  A/P  1 chronic systolic congestive heart failure-she has improved since recent admission for congestive heart failure.  Continue present dose of diuretic.  We discussed the importance of fluid restriction and low-sodium diet.  Check potassium and renal function.  Note she will take an additional 40 mg of Lasix for weight gain of 2 to 3 pounds.  2 coronary artery disease-patient denies chest pain.  Continue aspirin and statin.  3 ischemic cardiomyopathy-patient continues to decline ICD and understands the risk of sudden death.  Continue beta-blocker.  Discontinue lisinopril.  In 2 days we will begin Entresto 24/26 twice daily.  Titrate as tolerated by pulse and blood pressure.  4 hypertension-blood pressure is controlled.  Continue present medications.  5 hyperlipidemia-continue statin.  Kirk Ruths, MD

## 2017-10-03 NOTE — Patient Instructions (Addendum)
Thank you for allowing [place] to care for you  We are re-checking your kidney function today given the recent changes in your heart failure medicaitons - You will be contacted with the results  Please take both 500mg  Metformin tablets in the morning going forward  Please follow up in about 3 months as scheduled

## 2017-10-03 NOTE — Assessment & Plan Note (Addendum)
Patient admitted 8/16 - 8/20 for A/C CHF. She was discharge on furosemide, discharge weight of 79.4kg, and Lisinopril restarted. BMP at follow up 1 week ago showed Cr stable from discharge at 1.46 (1.34), but elevated from previous baseline of ~1.2. This may be due to resumption of ACE-I on discharge in the setting of new diuretic use. Patient presented for follow up and repeat BMP. She denies and orthopnea or SOB. She endorses mild edema that resolves with leg elevation. BP softer today at 153 Systolic; patient denies any lightheadedness. Weight remains stable today at 78.5kg.  Will repeat BMP today as planned and notify patient with results. She was advised to let us know if she experiences and lightheadedness in case her lasix needs further adjustment. - Coreg 25mg  BID - Spironolactone 25mg  Daily - ASA 81mg  Daily - Continue Lasix 80mg  Daily - BMP

## 2017-10-04 LAB — BMP8+ANION GAP
Anion Gap: 16 mmol/L (ref 10.0–18.0)
BUN/Creatinine Ratio: 19 (ref 12–28)
BUN: 26 mg/dL (ref 8–27)
CHLORIDE: 98 mmol/L (ref 96–106)
CO2: 26 mmol/L (ref 20–29)
Calcium: 9 mg/dL (ref 8.7–10.3)
Creatinine, Ser: 1.4 mg/dL — ABNORMAL HIGH (ref 0.57–1.00)
GFR calc non Af Amer: 39 mL/min/{1.73_m2} — ABNORMAL LOW (ref >59)
GFR, EST AFRICAN AMERICAN: 45 mL/min/{1.73_m2} — AB (ref >59)
GLUCOSE: 152 mg/dL — AB (ref 65–99)
POTASSIUM: 4.2 mmol/L (ref 3.5–5.2)
Sodium: 140 mmol/L (ref 134–144)

## 2017-10-04 NOTE — Progress Notes (Signed)
Internal Medicine Clinic Attending  Case discussed with Dr. Melvin  at the time of the visit.  We reviewed the resident's history and exam and pertinent patient test results.  I agree with the assessment, diagnosis, and plan of care documented in the resident's note.  

## 2017-10-05 ENCOUNTER — Telehealth: Payer: Self-pay | Admitting: Internal Medicine

## 2017-10-05 NOTE — Telephone Encounter (Signed)
After being unable to reach patient; I left message with patients daughter as described in [place] on file. To inform her of the results of patient's recent lab work. She was informed that her lab work showed that her renal function remains stable and that she can continue to take current medications. It was reiterated to her that if she has significant weight loss or light headedness this is a sign that she is putting out too much fluid and she should schedule an appointment for possible dose adjustment.  Pearson Grippe, DO IM PGY-2

## 2017-10-07 ENCOUNTER — Encounter: Payer: Self-pay | Admitting: Cardiology

## 2017-10-07 ENCOUNTER — Ambulatory Visit (INDEPENDENT_AMBULATORY_CARE_PROVIDER_SITE_OTHER): Payer: Medicare Other | Admitting: Cardiology

## 2017-10-07 VITALS — BP 100/56 | HR 90 | Ht 62.0 in | Wt 177.0 lb

## 2017-10-07 DIAGNOSIS — I2510 Atherosclerotic heart disease of native coronary artery without angina pectoris: Secondary | ICD-10-CM | POA: Diagnosis not present

## 2017-10-07 DIAGNOSIS — I10 Essential (primary) hypertension: Secondary | ICD-10-CM | POA: Diagnosis not present

## 2017-10-07 DIAGNOSIS — I5022 Chronic systolic (congestive) heart failure: Secondary | ICD-10-CM | POA: Diagnosis not present

## 2017-10-07 DIAGNOSIS — Z79899 Other long term (current) drug therapy: Secondary | ICD-10-CM

## 2017-10-07 DIAGNOSIS — E7800 Pure hypercholesterolemia, unspecified: Secondary | ICD-10-CM

## 2017-10-07 MED ORDER — SACUBITRIL-VALSARTAN 24-26 MG PO TABS: 1.0000 | ORAL_TABLET | Freq: Two times a day (BID) | ORAL | 3 refills | Status: DC

## 2017-10-07 NOTE — Patient Instructions (Signed)
Medication Instructions: Your Physician recommend you make the following changes to your medication. Stop: Lisinopril 40 mg Start: Entresto 24-26 mg on Thursday  Take an extra 40 meq of Lasix for a weight gain of 2-3 lbs in a day.   If you need a refill on your cardiac medications before your next appointment, please call your pharmacy.   Labwork: Your physician recommends that you return for lab work in 1 week after starting Entresto.    Procedures/Testing: None  Follow-Up: Your physician wants you to follow-up in 2 weeks with APP and 3 months with Dr.Crenshaw Special Instructions:    Thank you for choosing Heart[facility] at [facility]!!

## 2017-10-13 ENCOUNTER — Other Ambulatory Visit: Payer: Self-pay | Admitting: Internal Medicine

## 2017-10-15 NOTE — Telephone Encounter (Signed)
Refill approved.

## 2017-10-25 ENCOUNTER — Other Ambulatory Visit: Payer: Self-pay | Admitting: Cardiology

## 2017-10-25 LAB — BASIC METABOLIC PANEL WITH GFR
BUN/Creatinine Ratio: 16 (calc) (ref 6–22)
BUN: 27 mg/dL — AB (ref 7–25)
CALCIUM: 8.7 mg/dL (ref 8.6–10.4)
CO2: 25 mmol/L (ref 20–32)
CREATININE: 1.74 mg/dL — AB (ref 0.50–0.99)
Chloride: 102 mmol/L (ref 98–110)
GFR, Est African American: 35 mL/min/{1.73_m2} — ABNORMAL LOW (ref >=60)
GFR, Est Non African American: 30 mL/min/{1.73_m2} — ABNORMAL LOW (ref >=60)
GLUCOSE: 249 mg/dL — AB (ref 65–139)
Potassium: 4.1 mmol/L (ref 3.5–5.3)
Sodium: 137 mmol/L (ref 135–146)

## 2017-10-26 NOTE — Progress Notes (Signed)
Cardiology Office Note   Date:  10/28/2017   ID:  [name], DOB 03/23/1950, MRN [id number]  PCP:  Julia Seat, MD  Cardiologist: Dr. Stanford Breed Chief Complaint  Patient presents with  . Edema    ankles swole  . Follow-up  . Congestive Heart Failure     History of Present Illness: [name] is a 67 y.o. female who presents for ongoing assessment and management of systolic heart failure with most recent echocardiogram revealing EF of 25% to 30% with mild to moderate MR, mild left atrial, right atrial, and right ventricle enlargement, moderately reduced RV function.  He was noted to have a small pericardial effusion.  He has a history of CABG with LIMA to LAD, sequential SVG to first and second diagonal, SVG to OM, SVG to PDA.  He has been noted that the patient was referred to EP for consideration of ICD however the patient declined.  Most recent admission was in August 2019 for decompensated CHF and was diuresed.  Echocardiogram at that time continue to reveal reduced EF of 25%.  Moderate mitral regurg, moderate left atrial enlargement, mild to moderate RV dysfunction, mild to moderate tricuspid regurg.  On last office visit with Dr. Stanford Breed on 10/07/2017 she was found to be euvolemic and continued on her current regimen of diuretic with advisement to take an additional 40 mg as needed for fluid retention.  Lisinopril was discontinued, and Entresto 24 mg / 26 mg twice daily was started.  Patient is here for follow-up to assess response to medication changes.  She has been doing well. She ran out of lasix for about 5 days because she could not afford the Rx. She just had it filled today. She has not had any dizziness or weakness on Entresto.   Past Medical History:  Diagnosis Date  . Acute exacerbation of CHF (congestive heart failure) ([place]) 09/12/2017  . Acute gallstone pancreatitis   . Acute on chronic systolic heart failure ([place])   . Bilateral pleural effusion 02/22/2014   . Cardiomyopathy, ischemic, EF 25-30% 03/23/2014   03/01/2014- Date of procedure- CABG- X5- Severe 3-vessel coronary artery disease with severe LV dysfunction. Ejection fraction 25-30%. SURGICAL PROCEDURE: Coronary artery bypass grafting x 5. SURGEON: Lanelle Bal, MD.    . Chronic systolic heart failure ([place])   . Coronary artery disease involving native coronary artery of native heart without angina pectoris   . Diabetes mellitus 1998   Dx in 1998. Microalbuminuria, never on insulin.  . Diabetes mellitus type 2, controlled, without complications ([place]) 61/04/4313   Last A1C 6.7 (03/21/17)    On Diet Control Only Last Foot Exam: 03/21/17    Last eye exam: to be scheduled early 2019   . Dyspnea   . Essential hypertension 12/05/2005   Lisinopril 40mg  Daily, Coreg 25mg  BID.   Marland Kitchen GERD 12/05/2005   Qualifier: Diagnosis of  By: Prudencio Burly MD, Phillip Heal    . GERD (gastroesophageal reflux disease)   . History of blurry vision 06/09   Hospitalized for this  . Hyperlipidemia   . Hypertension   . Personal history of colonic adenomas 10/07/2007  . Pneumonia 01/2014  . 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 Viral URI with cough 03/21/2017    Past Surgical History:  Procedure Laterality Date  . CHOLECYSTECTOMY N/A 08/06/2016   Procedure: LAPAROSCOPIC CHOLECYSTECTOMY WITH INTRAOPERATIVE CHOLANGIOGRAM;  Surgeon: Georganna Skeans, MD;  Location: [facility];  Service: General;  Laterality: N/A;  . CORONARY ARTERY BYPASS GRAFT N/A 03/01/2014  Procedure: CORONARY ARTERY BYPASS GRAFTING (CABG) x five, using left internal mammary artery and right leg greater saphenous vein harvested endoscopically;  Surgeon: Grace Isaac, MD;  Location: [facility];  Service: Open Heart Surgery;  Laterality: N/A;  . INTRAOPERATIVE TRANSESOPHAGEAL ECHOCARDIOGRAM N/A 03/01/2014   Procedure: INTRAOPERATIVE TRANSESOPHAGEAL ECHOCARDIOGRAM;  Surgeon: Grace Isaac, MD;  Location: [place] [facility];  Service: Open Heart Surgery;  Laterality: N/A;  . LEFT AND RIGHT HEART  CATHETERIZATION WITH CORONARY ANGIOGRAM N/A 02/26/2014   Procedure: LEFT AND RIGHT HEART CATHETERIZATION WITH CORONARY ANGIOGRAM;  Surgeon: Sinclair Grooms, MD;  Location: [facility] CATH LAB;  Service: Cardiovascular;  Laterality: N/A;  . TOTAL ABDOMINAL HYSTERECTOMY W/ BILATERAL SALPINGOOPHORECTOMY  1996     Current Outpatient Medications  Medication Sig Dispense Refill  . aspirin 81 MG tablet Take 1 tablet (81 mg total) by mouth daily.    Marland Kitchen atorvastatin (LIPITOR) 80 MG tablet TAKE ONE TABLET BY MOUTH DAILY 90 tablet 2  . carvedilol (COREG) 25 MG tablet Take 25 mg by mouth 2 (two) times daily with a meal.    . metFORMIN (GLUCOPHAGE XR) 500 MG 24 hr tablet Take 2 tablets (1,000 mg total) by mouth daily with breakfast. 180 tablet 1  . sacubitril-valsartan (ENTRESTO) 24-26 MG Take 1 tablet by mouth 2 (two) times daily. 180 tablet 3  . spironolactone (ALDACTONE) 25 MG tablet TAKE ONE TABLET BY MOUTH DAILY 90 tablet 1  . furosemide (LASIX) 80 MG tablet Take 1 tablet (80 mg total) by mouth daily. 90 tablet 3   No current facility-administered medications for this visit.     Allergies:   Patient has no known allergies.    Social History:  The patient  reports that she has never smoked. She has never used smokeless tobacco. She reports that she does not drink alcohol or use drugs.   Family History:  The patient's family history includes Diabetes in her brother and mother; Heart failure in her brother, mother, and sister; Kidney failure in her mother.    ROS: All other systems are reviewed and negative. Unless otherwise mentioned in H&P    PHYSICAL EXAM: VS:  BP 104/68   Pulse 83   Ht 5\' 2"  (1.575 m)   Wt 178 lb 9.6 oz (81 kg)   BMI 32.67 kg/m  , BMI Body mass index is 32.67 kg/m. GEN: Well nourished, well developed, in no acute distress HEENT: normal Neck: no JVD, carotid bruits, or masses Cardiac: RRR; distant heart sounds, no murmurs, rubs, or gallops, 1+ dependent  edema  Respiratory:   Clear to auscultation bilaterally, normal work of breathing GI: soft, nontender, nondistended, + BS MS: no deformity or atrophy Skin: warm and dry, no rash Neuro:  Strength and sensation are intact Psych: euthymic mood, full affect   EKG:  Not completed this office visit.   Recent Labs: 09/12/2017: B Natriuretic Peptide 2,171.3; Hemoglobin 9.8; Platelets 177 09/14/2017: ALT 75 09/17/2017: Magnesium 2.2 10/25/2017: BUN 27; Creat 1.74; Potassium 4.1; Sodium 137    Lipid Panel    Component Value Date/Time   CHOL 176 03/08/2017 1424   TRIG 76 03/08/2017 1424   HDL 68 03/08/2017 1424   CHOLHDL 2.6 03/08/2017 1424   CHOLHDL 2.3 06/26/2016 1122   VLDL 13 06/26/2016 1122   LDLCALC 93 03/08/2017 1424      Wt Readings from Last 3 Encounters:  10/28/17 178 lb 9.6 oz (81 kg)  10/07/17 177 lb (80.3 kg)  10/03/17 173 lb 1.6 oz (  78.5 kg)      Other studies Reviewed: Echocardiogram Sep 27, 2017  - Left ventricle: The cavity size was normal. Wall thickness was   normal. Akinetic apex and septal wall. Inferior akinesis. Lateral   wall relatively preserved. The estimated ejection fraction was   25%. Doppler parameters are consistent with abnormal left   ventricular relaxation (grade 1 diastolic dysfunction). - Aortic valve: There was no stenosis. - Mitral valve: There was moderate regurgitation. - Left atrium: The atrium was moderately dilated. - Right ventricle: The cavity size was normal. Systolic function   was mildly to moderately reduced. - Tricuspid valve: There was mild-moderate regurgitation. Peak   RV-RA gradient (S): 40 mm Hg. - Pulmonary arteries: PA peak pressure: 48 mm Hg (S). - Systemic veins: IVC measured 2.2 cm with > 50% respirophasic   variation, suggesting RA pressure 8 mmHg. - Pericardium, extracardiac: Pleural effusion noted.  Impressions:  - Normal LV size with EF 25%, wall motion abnormalities as noted   above. Normal RV size with mild-moderate RV systolic  dysfunction.   Moderate mitral regurgitation. Mild pulmonary hypertension.  ASSESSMENT AND PLAN:  1. Chronic Systolic CHF: EF of 80%. Tolerating Entresto. She had run out of lasix for about 5 days. Just got it filled. She has gained a small amount of weight. Mild dependent edema. She will be given 90 day refills on lasix as this is a reduced cost with longer duration to help with her medication compliance. She stated that she could not afford to fill her Rx until now.   I have had her see our nutritionist intern today for evaluation of her needs.   2. Hypertension: BP is well controlled currently. She has not taken the lasix today. I am concerned that her BP will drop too low on diuretic and Entresto. Will have her take her BP at home and report any dizziness or hypotension. She is also on spironolactone. She has appt in December with Dr.Crenshaw. She will keep this.  3. Hypercholesterolemia: Continue statin therapy.   Current medicines are reviewed at length with the patient today.    Labs/ tests ordered today include: None   Phill Myron. [place] Pugh, ANP, AACC   10/28/2017 2:53 PM    [address] [place] [address] Office [phone] Fax [phone]

## 2017-10-28 ENCOUNTER — Encounter: Payer: Self-pay | Admitting: *Deleted

## 2017-10-28 ENCOUNTER — Ambulatory Visit (INDEPENDENT_AMBULATORY_CARE_PROVIDER_SITE_OTHER): Payer: Medicare Other | Admitting: Adult Health

## 2017-10-28 ENCOUNTER — Encounter: Payer: Self-pay | Admitting: Adult Health

## 2017-10-28 VITALS — BP 104/68 | HR 83 | Ht 62.0 in | Wt 178.6 lb

## 2017-10-28 DIAGNOSIS — E7800 Pure hypercholesterolemia, unspecified: Secondary | ICD-10-CM | POA: Diagnosis not present

## 2017-10-28 DIAGNOSIS — I10 Essential (primary) hypertension: Secondary | ICD-10-CM

## 2017-10-28 DIAGNOSIS — I5022 Chronic systolic (congestive) heart failure: Secondary | ICD-10-CM | POA: Diagnosis not present

## 2017-10-28 MED ORDER — FUROSEMIDE 80 MG PO TABS: 80.0000 mg | ORAL_TABLET | Freq: Every day | ORAL | 3 refills | Status: DC

## 2017-10-28 NOTE — Patient Instructions (Signed)
Follow-Up: Your physician wants you to follow-up in: [place]  Thank you for choosing [facility] at [facility]!!

## 2017-12-05 ENCOUNTER — Ambulatory Visit
Admission: RE | Admit: 2017-12-05 | Discharge: 2017-12-05 | Disposition: A | Discharge status: Home / Self Care | Payer: Medicare Other | Source: Ambulatory Visit | Attending: Internal Medicine | Admitting: Internal Medicine

## 2017-12-05 DIAGNOSIS — E2839 Other primary ovarian failure: Secondary | ICD-10-CM

## 2017-12-31 ENCOUNTER — Encounter: Payer: Self-pay | Admitting: Internal Medicine

## 2017-12-31 ENCOUNTER — Ambulatory Visit (INDEPENDENT_AMBULATORY_CARE_PROVIDER_SITE_OTHER): Payer: Medicare Other | Admitting: Internal Medicine

## 2017-12-31 ENCOUNTER — Other Ambulatory Visit: Payer: Self-pay

## 2017-12-31 VITALS — BP 113/63 | HR 88 | Temp 98.2°F | Ht 62.0 in | Wt 172.5 lb

## 2017-12-31 DIAGNOSIS — M85852 Other specified disorders of bone density and structure, left thigh: Secondary | ICD-10-CM

## 2017-12-31 DIAGNOSIS — I5022 Chronic systolic (congestive) heart failure: Secondary | ICD-10-CM | POA: Diagnosis not present

## 2017-12-31 DIAGNOSIS — Z0000 Encounter for general adult medical examination without abnormal findings: Secondary | ICD-10-CM

## 2017-12-31 DIAGNOSIS — I10 Essential (primary) hypertension: Secondary | ICD-10-CM

## 2017-12-31 DIAGNOSIS — E119 Type 2 diabetes mellitus without complications: Secondary | ICD-10-CM | POA: Diagnosis not present

## 2017-12-31 DIAGNOSIS — Z23 Encounter for immunization: Secondary | ICD-10-CM

## 2017-12-31 DIAGNOSIS — R112 Nausea with vomiting, unspecified: Secondary | ICD-10-CM

## 2017-12-31 DIAGNOSIS — R1110 Vomiting, unspecified: Secondary | ICD-10-CM | POA: Insufficient documentation

## 2017-12-31 HISTORY — DX: Vomiting, unspecified: R11.10

## 2017-12-31 LAB — POCT GLYCOSYLATED HEMOGLOBIN (HGB A1C): Hemoglobin A1C: 6.4 % — AB (ref 4.0–5.6)

## 2017-12-31 LAB — GLUCOSE, CAPILLARY: Glucose-Capillary: 151 mg/dL — ABNORMAL HIGH (ref 70–99)

## 2017-12-31 NOTE — Progress Notes (Signed)
   CC: Heart Failure, Hypertension, Diabetes, Nausea, Osteopenia  HPI:  [name] is a 67 y.o. F with PMHx listed below presenting for  Heart Failure, Hypertension, Diabetes. Please see the A&P for the status of the patient's chronic medical problems.    Past Medical History:  Diagnosis Date  . Acute exacerbation of CHF (congestive heart failure) ([place]) 09/12/2017  . Acute gallstone pancreatitis   . Acute on chronic systolic heart failure ([place])   . Bilateral pleural effusion 02/22/2014  . Cardiomyopathy, ischemic, EF 25-30% 03/23/2014   03/01/2014- Date of procedure- CABG- X5- Severe 3-vessel coronary artery disease with severe LV dysfunction. Ejection fraction 25-30%. SURGICAL PROCEDURE: Coronary artery bypass grafting x 5. SURGEON: Lanelle Bal, MD.    . Chronic systolic heart failure ([place])   . Coronary artery disease involving native coronary artery of native heart without angina pectoris   . Diabetes mellitus 1998   Dx in 1998. Microalbuminuria, never on insulin.  . Diabetes mellitus type 2, controlled, without complications ([place]) 80/0/3491   Last A1C 6.7 (03/21/17)    On Diet Control Only Last Foot Exam: 03/21/17    Last eye exam: to be scheduled early 2019   . Dyspnea   . Essential hypertension 12/05/2005   Lisinopril 40mg  Daily, Coreg 25mg  BID.   Marland Kitchen GERD 12/05/2005   Qualifier: Diagnosis of  By: Prudencio Burly MD, Phillip Heal    . GERD (gastroesophageal reflux disease)   . History of blurry vision 06/09   Hospitalized for this  . Hyperlipidemia   . Hypertension   . Personal history of colonic adenomas 10/07/2007  . Pneumonia 01/2014  . 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 Viral URI with cough 03/21/2017   Review of Systems:  Performed and all others negative.  Physical Exam:  Vitals:   12/31/17 1348  BP: 113/63  Pulse: 88  Temp: 98.2 F (36.8 C)  TempSrc: Oral  SpO2: 100%  Weight: 172 lb 8 oz (78.2 kg)  Height: 5\' 2"  (1.575 m)   Physical Exam  Constitutional: She appears  well-developed and well-nourished. No distress.  Cardiovascular: Normal rate, regular rhythm, normal heart sounds and intact distal pulses.  Pulmonary/Chest: Effort normal and breath sounds normal. No respiratory distress.  Abdominal: Soft. Bowel sounds are normal. She exhibits no distension. There is no tenderness.  Musculoskeletal: She exhibits no deformity.  Mild LLE pitting edema  Skin: Skin is warm and dry.    Assessment & Plan:   See Encounters Tab for problem based charting.  Patient discussed with Dr. Angelia Mould

## 2017-12-31 NOTE — Assessment & Plan Note (Signed)
Bone density screening showed osteopenia of L femur neck with T score of -1.4. Results discussed with patient. -  Recommend calcium and vitamin D supplement for osteopenia. As patient has had issues with affording medications in the past, she was advised to prioritize her other prescription medications and add supplements if able.

## 2017-12-31 NOTE — Assessment & Plan Note (Addendum)
Patient seen recently by her Cardiologist. She has been transitioned from lisinopril to [place]. She was noted to have some edmea at recent visit and had stopped her lasix for a few days as she was unable to afford it for that period of time (this was reoreded by them as a 90 day supple to reduce cost). Her blood pressure has remained soft (105 last visit here and is 113/63 today, thoguh she missed her morning medications). Dry weight ~78-79kgs based on recent admission; she weighs 78.2kg today. Mild LLE pitting edema, resolves with elevation. Denies SOB. - Coreg 25mg  BID - Entresto 24/26mg  BID - Spironolactone 25mg  Daily - Lasix 80mg  Daily - ASA 81mg  Daily

## 2017-12-31 NOTE — Assessment & Plan Note (Addendum)
BP 113/63today.BP last visit 105/59. Her blood pressure has been soft due to her multiple BP lower medications indicated for her heart failure. BP stable today, though she states she did not take her morning medications today. Advised patient to take her morning doses, she has follow up with cardiology in the next couple of weeks. -Entresto 24-26mg  BID -Coreg 25mg BID - Spironolactone 25mg  Daily - Lasix 80mg  Daily

## 2017-12-31 NOTE — Assessment & Plan Note (Signed)
Patient reports several several episodes of nausea relieved by vomiting in the past year. She states that these episodes seem to be triggered by exertion. Unclear etiology at this time. Upon chart review this is not a known anginal equivalent as her CAD was discovered following ECHO with reduced EF. - Continue to monitor - Will message cardiologist

## 2017-12-31 NOTE — Progress Notes (Signed)
HPI: FU CAD; previously admitted with CHF; echo 1/16 showed EF 25-30, mild to moderate MR, mild LAE/RAE/RVE; moderately reduced RV function; small pericardial effusion. Carotid dopplers 1/16 showed 1-39 bilateral stenosis. Cath revealed 3 vessel CAD and EF 30-35. Had CABG with LIMA to LAD, seq svg to first and second diagonal, svg to OM, svg to PDA. Patient previously referred to electrophysiology for consideration of ICD but patient declined.  Admitted August 2019 with congestive heart failure and diuresed. Last echocardiogram repeated August 2019 and showed ejection fraction 25%, moderate mitral regurgitation, moderate left atrial enlargement, mild to moderate RV dysfunction, mild to moderate tricuspid regurgitation.  Since last seen,she denies dyspnea, chest pain, palpitations or syncope.  She has intermittent mild pedal edema.  Current Outpatient Medications  Medication Sig Dispense Refill  . aspirin 81 MG tablet Take 1 tablet (81 mg total) by mouth daily.    Marland Kitchen atorvastatin (LIPITOR) 80 MG tablet TAKE ONE TABLET BY MOUTH DAILY 90 tablet 1  . carvedilol (COREG) 25 MG tablet TAKE ONE TABLET BY MOUTH TWICE A DAY WITH A MEAL 180 tablet 1  . furosemide (LASIX) 80 MG tablet Take 1 tablet (80 mg total) by mouth daily. 90 tablet 3  . metFORMIN (GLUCOPHAGE-XR) 500 MG 24 hr tablet TAKE TWO TABLETS BY MOUTH DAILY WITH BREAKFAST 180 tablet 0  . sacubitril-valsartan (ENTRESTO) 24-26 MG Take 1 tablet by mouth 2 (two) times daily. 180 tablet 3  . spironolactone (ALDACTONE) 25 MG tablet TAKE ONE TABLET BY MOUTH DAILY 90 tablet 0   No current facility-administered medications for this visit.      Past Medical History:  Diagnosis Date  . Acute exacerbation of CHF (congestive heart failure) ([place]) 09/12/2017  . Acute gallstone pancreatitis   . Acute gallstone pancreatitis   . Acute on chronic systolic heart failure ([place])   . Bilateral pleural effusion 02/22/2014  . Cardiomyopathy, ischemic, EF  25-30% 03/23/2014   03/01/2014- Date of procedure- CABG- X5- Severe 3-vessel coronary artery disease with severe LV dysfunction. Ejection fraction 25-30%. SURGICAL PROCEDURE: Coronary artery bypass grafting x 5. SURGEON: Lanelle Bal, MD.    . Chronic systolic heart failure ([place])   . Coronary artery disease involving native coronary artery of native heart without angina pectoris   . Diabetes mellitus 1998   Dx in 1998. Microalbuminuria, never on insulin.  . Diabetes mellitus type 2, controlled, without complications ([place]) 85/06/3147   Last A1C 6.7 (03/21/17)    On Diet Control Only Last Foot Exam: 03/21/17    Last eye exam: to be scheduled early 2019   . Dyspnea   . Essential hypertension 12/05/2005   Lisinopril 40mg  Daily, Coreg 25mg  BID.   Marland Kitchen GERD 12/05/2005   Qualifier: Diagnosis of  By: Prudencio Burly MD, Phillip Heal    . GERD (gastroesophageal reflux disease)   . History of blurry vision 06/09   Hospitalized for this  . Hyperlipidemia   . Hypertension   . Personal history of colonic adenomas 10/07/2007  . Pneumonia 01/2014  . 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 Viral URI with cough 03/21/2017    Past Surgical History:  Procedure Laterality Date  . CHOLECYSTECTOMY N/A 08/06/2016   Procedure: LAPAROSCOPIC CHOLECYSTECTOMY WITH INTRAOPERATIVE CHOLANGIOGRAM;  Surgeon: Georganna Skeans, MD;  Location: [facility];  Service: General;  Laterality: N/A;  . CORONARY ARTERY BYPASS GRAFT N/A 03/01/2014   Procedure: CORONARY ARTERY BYPASS GRAFTING (CABG) x five, using left internal mammary artery and right leg greater saphenous vein harvested  endoscopically;  Surgeon: Grace Isaac, MD;  Location: [facility];  Service: Open Heart Surgery;  Laterality: N/A;  . INTRAOPERATIVE TRANSESOPHAGEAL ECHOCARDIOGRAM N/A 03/01/2014   Procedure: INTRAOPERATIVE TRANSESOPHAGEAL ECHOCARDIOGRAM;  Surgeon: Grace Isaac, MD;  Location: [facility];  Service: Open Heart Surgery;  Laterality: N/A;  . LEFT AND RIGHT HEART CATHETERIZATION WITH CORONARY  ANGIOGRAM N/A 02/26/2014   Procedure: LEFT AND RIGHT HEART CATHETERIZATION WITH CORONARY ANGIOGRAM;  Surgeon: Sinclair Grooms, MD;  Location: [facility] CATH LAB;  Service: Cardiovascular;  Laterality: N/A;  . TOTAL ABDOMINAL HYSTERECTOMY W/ BILATERAL SALPINGOOPHORECTOMY  1996    Social History   Socioeconomic History  . Marital status: Widowed    Spouse name: Not on file  . Number of children: Not on file  . Years of education: Not on file  . Highest education level: Not on file  Occupational History  . Occupation: Automotive engineer  Social Needs  . Financial resource strain: Not on file  . Food insecurity:    Worry: Not on file    Inability: Not on file  . Transportation needs:    Medical: Not on file    Non-medical: Not on file  Tobacco Use  . Smoking status: Never Smoker  . Smokeless tobacco: Never Used  Substance and Sexual Activity  . Alcohol use: No    Alcohol/week: 0.0 standard drinks  . Drug use: No  . Sexual activity: Not on file  Lifestyle  . Physical activity:    Days per week: Not on file    Minutes per session: Not on file  . Stress: Not on file  Relationships  . Social connections:    Talks on phone: Not on file    Gets together: Not on file    Attends religious service: Not on file    Active member of club or organization: Not on file    Attends meetings of clubs or organizations: Not on file    Relationship status: Not on file  . Intimate partner violence:    Fear of current or ex partner: Not on file    Emotionally abused: Not on file    Physically abused: Not on file    Forced sexual activity: Not on file  Other Topics Concern  . Not on file  Social History Narrative   Married x 42 yrs.  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   Never smoked. Doesn't drink or use drugs.   Does Patient Exercise:  yes - sometimes      To get diabetes supplies for [name] RD, CDE, April 20,2011     Family History  Problem Relation Age of Onset  . Diabetes Mother        Mother died of MI at age 68  . Heart failure Mother   . Kidney failure Mother   . Heart failure Sister        Died  . Heart failure Brother        Died  . Diabetes Brother        Died    ROS: no fevers or chills, productive cough, hemoptysis, dysphasia, odynophagia, melena, hematochezia, dysuria, hematuria, rash, seizure activity, orthopnea, PND, claudication. Remaining systems are negative.  Physical Exam: Well-developed well-nourished in no acute distress.  Skin is warm and dry.  HEENT is normal.  Neck is supple.  Chest is clear to auscultation with normal expansion.  Cardiovascular exam is regular rate and rhythm.  Abdominal exam nontender or distended. No masses palpated. Extremities show trace edema. neuro grossly intact  A/P  1 chronic systolic congestive heart failure-patient doing well from a symptomatic standpoint.  Plan to continue present dose of Lasix and spironolactone.  Continue fluid restriction and low-sodium diet.  Check potassium and renal function.  Take an additional 40 mg of Lasix daily for weight gain of 2 to 3 pounds.  2 ischemic cardiomyopathy-continue beta-blocker and Entresto.  Blood pressure will not allow further titration.  She declines ICD and understands the risk of sudden death.  3 coronary artery disease-continue aspirin and statin.  No chest pain.  4 hypertension-patient's blood pressure is controlled.  Continue present medications and follow.  5 hyperlipidemia-continue statin.  Kirk Ruths, MD

## 2017-12-31 NOTE — Patient Instructions (Addendum)
Thank you for allowing [place] to care for you  For your heart failure and high blood pressure - Continue current medications - Follow up with cardiology as scheduled  For your diabetes - A1c today was good at 6.4 - Continue Metformin - Please schedule eye exam when able  For your Osteopenia (decreased bone density) - We recommend a supplement for Calcium and Vitamin D if affordable  Your received your pneumonia vaccine today  Please follow up in about 3 months

## 2017-12-31 NOTE — Assessment & Plan Note (Signed)
Discussed recent done density results and recommended calcium and vitamin D supplement for osteopenia

## 2017-12-31 NOTE — Assessment & Plan Note (Addendum)
A1c 6.4 today. This is stable/improved from previous of 6.7. She remains well controlled on current regimen. She is due for eye exam, which she plans to schedule soon when she can afford this. Continue current regimen. - Metformin XRto 1000mg  Daily - Continue lifestyle modifications - Eye exam - Follow up in 3 months

## 2018-01-02 ENCOUNTER — Encounter: Payer: Medicare Other | Admitting: Internal Medicine

## 2018-01-02 NOTE — Progress Notes (Signed)
Internal Medicine Clinic Attending  Case discussed with Dr. Melvin  at the time of the visit.  We reviewed the resident's history and exam and pertinent patient test results.  I agree with the assessment, diagnosis, and plan of care documented in the resident's note.  

## 2018-01-09 ENCOUNTER — Other Ambulatory Visit: Payer: Self-pay | Admitting: Cardiology

## 2018-01-09 ENCOUNTER — Other Ambulatory Visit: Payer: Self-pay | Admitting: Internal Medicine

## 2018-01-09 DIAGNOSIS — E119 Type 2 diabetes mellitus without complications: Secondary | ICD-10-CM

## 2018-01-09 DIAGNOSIS — E785 Hyperlipidemia, unspecified: Secondary | ICD-10-CM

## 2018-01-09 NOTE — Telephone Encounter (Signed)
Refill approved.

## 2018-01-13 ENCOUNTER — Encounter: Payer: Self-pay | Admitting: Cardiology

## 2018-01-13 ENCOUNTER — Ambulatory Visit (INDEPENDENT_AMBULATORY_CARE_PROVIDER_SITE_OTHER): Payer: Medicare Other | Admitting: Cardiology

## 2018-01-13 VITALS — BP 110/60 | HR 60 | Ht 62.0 in | Wt 174.2 lb

## 2018-01-13 DIAGNOSIS — E7800 Pure hypercholesterolemia, unspecified: Secondary | ICD-10-CM

## 2018-01-13 DIAGNOSIS — I5022 Chronic systolic (congestive) heart failure: Secondary | ICD-10-CM

## 2018-01-13 DIAGNOSIS — I10 Essential (primary) hypertension: Secondary | ICD-10-CM | POA: Diagnosis not present

## 2018-01-13 DIAGNOSIS — I2510 Atherosclerotic heart disease of native coronary artery without angina pectoris: Secondary | ICD-10-CM

## 2018-01-13 NOTE — Patient Instructions (Signed)
Medication Instructions:  NO CHANGE If you need a refill on your cardiac medications before your next appointment, please call your pharmacy.   Lab work: Your physician recommends that you HAVE LAB WORK TODAY If you have labs (blood work) drawn today and your tests are completely normal, you will receive your results only by: Marland Kitchen MyChart Message (if you have MyChart) OR . A paper copy in the mail If you have any lab test that is abnormal or we need to change your treatment, we will call you to review the results.  Follow-Up: At [facility], you and your health needs are our priority.  As part of our continuing mission to provide you with exceptional heart care, we have created designated Provider Care Teams.  These Care Teams include your primary Cardiologist (physician) and Advanced Practice Providers (APPs -  Physician Assistants and Nurse Practitioners) who all work together to provide you with the care you need, when you need it. You will need a follow up appointment in 6 months.  Please call our office 2 months in advance to schedule this appointment.  You may see Kirk Ruths MD or one of the following Advanced Practice Providers on your designated Care Team:   Kerin Ransom, PA-C Roby Lofts, [place] . Sande Rives, PA-C

## 2018-01-14 ENCOUNTER — Encounter: Payer: Self-pay | Admitting: *Deleted

## 2018-01-14 LAB — BASIC METABOLIC PANEL
BUN / CREAT RATIO: 16 (ref 12–28)
BUN: 24 mg/dL (ref 8–27)
CHLORIDE: 104 mmol/L (ref 96–106)
CO2: 22 mmol/L (ref 20–29)
CREATININE: 1.52 mg/dL — AB (ref 0.57–1.00)
Calcium: 9.5 mg/dL (ref 8.7–10.3)
GFR, EST AFRICAN AMERICAN: 41 mL/min/{1.73_m2} — AB (ref >59)
GFR, EST NON AFRICAN AMERICAN: 35 mL/min/{1.73_m2} — AB (ref >59)
Glucose: 168 mg/dL — ABNORMAL HIGH (ref 65–99)
Potassium: 4.4 mmol/L (ref 3.5–5.2)
Sodium: 144 mmol/L (ref 134–144)

## 2018-04-03 ENCOUNTER — Ambulatory Visit (INDEPENDENT_AMBULATORY_CARE_PROVIDER_SITE_OTHER): Payer: Medicare Other | Admitting: Internal Medicine

## 2018-04-03 ENCOUNTER — Other Ambulatory Visit: Payer: Self-pay

## 2018-04-03 ENCOUNTER — Encounter: Payer: Self-pay | Admitting: Internal Medicine

## 2018-04-03 VITALS — BP 110/51 | HR 76 | Temp 97.7°F | Ht 62.0 in | Wt 164.3 lb

## 2018-04-03 DIAGNOSIS — I5022 Chronic systolic (congestive) heart failure: Secondary | ICD-10-CM

## 2018-04-03 DIAGNOSIS — E119 Type 2 diabetes mellitus without complications: Secondary | ICD-10-CM

## 2018-04-03 DIAGNOSIS — D649 Anemia, unspecified: Secondary | ICD-10-CM

## 2018-04-03 DIAGNOSIS — Z7984 Long term (current) use of oral hypoglycemic drugs: Secondary | ICD-10-CM

## 2018-04-03 DIAGNOSIS — Z79899 Other long term (current) drug therapy: Secondary | ICD-10-CM

## 2018-04-03 DIAGNOSIS — Z7982 Long term (current) use of aspirin: Secondary | ICD-10-CM

## 2018-04-03 LAB — POCT GLYCOSYLATED HEMOGLOBIN (HGB A1C): Hemoglobin A1C: 6.1 % — AB (ref 4.0–5.6)

## 2018-04-03 LAB — GLUCOSE, CAPILLARY: Glucose-Capillary: 170 mg/dL — ABNORMAL HIGH (ref 70–99)

## 2018-04-03 MED ORDER — METFORMIN HCL ER 500 MG PO TB24: 500.0000 mg | ORAL_TABLET | Freq: Every day | ORAL | 3 refills | Status: DC

## 2018-04-03 NOTE — Assessment & Plan Note (Addendum)
Patient reports feeling more tired recently and had a Hgb of 8.8 at recent eye doctor visit. This is reduced from her previous of 9.7. Will recheck Hgb and obtain Fe studies. Previous Fe studies in 2016 consistent with anemia of chronic disease. - CBC - Fe, Ferritin, TIBC  Labs Results:    Component Value Date/Time   IRON 60 04/03/2018 1356   TIBC 237 (L) 04/03/2018 1356   FERRITIN 447 (H) 04/03/2018 1356   IRONPCTSAT 25 04/03/2018 1356   CBC Latest Ref Rng & Units 04/03/2018 09/12/2017 11/22/2016  WBC 3.4 - 10.8 x10E3/uL 5.0 5.9 7.0  Hemoglobin 11.1 - 15.9 g/dL 8.4(L) 9.8(L) 9.7(L)  Hematocrit 34.0 - 46.6 % 25.1(L) 30.8(L) 30.5(L)  Platelets 150 - 450 x10E3/uL 175 177 185  - Result remain consistent with anemia of chronic disease. Will continue to monitor. If worsening symptoms or further drop in Hgb, will need to consider transfusion.

## 2018-04-03 NOTE — Patient Instructions (Addendum)
Thank you for allowing [place] to care for you  For your Diabetes - A1c today 6.1 - Reduce Metformin to 500mg  Daily  For your heart failure and high blood pressure - Continue current medications  For your Anemia (low red blood cell count) - We are checking labs today, you will be contacted with the results  Please follow up in about 3 months

## 2018-04-03 NOTE — Assessment & Plan Note (Addendum)
Patient seen by her cardiologist since our last visit, she has been conctinued on previous regimen. Dry weight Estimated at ~78-79kgs during recent admission; she weighs 74.5kg. Mild LLE pitting edema, resolves with elevation. Denies SOB. Recent labs at eye visit show stable BUN 25 and K of 3.6. Will continue current regimen. - Coreg 25mg  BID - Entresto 24/26mg  BID - Spironolactone 25mg  Daily - Lasix 80mg  Daily - ASA 81mg  Daily - Take Lasix 40mg  PRN for weight gain of 2-3lbs - Follow up with Cardiology

## 2018-04-03 NOTE — Progress Notes (Signed)
Case discussed with Dr. Trilby Drummer at the time of the visit. We reviewed the resident's history and exam and pertinent patient test results. I agree with the assessment, diagnosis, and plan of care documented in the resident's note.

## 2018-04-03 NOTE — Assessment & Plan Note (Addendum)
A1c 6.1 today. This is improved from previous of 6.4. She remains well controlled on current regimen. Will reduce dose to 500 daily. She has had her eye exam since her last visit. - ReduceMetformin XRto 500mg  Daily - Continue lifestyle modifications - Follow up in 3 months

## 2018-04-03 NOTE — Progress Notes (Signed)
   CC: Heart Failure, Diabetes, Anemia   HPI:   [name] is a 68 y.o. F with PMHx listed below presenting for Heart Failure, Diabetes, Anemia. Please see the A&P for the status of the patient's chronic medical problems.  Past Medical History:  Diagnosis Date  . Acute exacerbation of CHF (congestive heart failure) ([place]) 09/12/2017  . Acute gallstone pancreatitis   . Acute gallstone pancreatitis   . Acute on chronic systolic heart failure ([place])   . Bilateral pleural effusion 02/22/2014  . Cardiomyopathy, ischemic, EF 25-30% 03/23/2014   03/01/2014- Date of procedure- CABG- X5- Severe 3-vessel coronary artery disease with severe LV dysfunction. Ejection fraction 25-30%. SURGICAL PROCEDURE: Coronary artery bypass grafting x 5. SURGEON: Lanelle Bal, MD.    . Chronic systolic heart failure ([place])   . Coronary artery disease involving native coronary artery of native heart without angina pectoris   . Diabetes mellitus 1998   Dx in 1998. Microalbuminuria, never on insulin.  . Diabetes mellitus type 2, controlled, without complications ([place]) 64/03/3293   Last A1C 6.7 (03/21/17)    On Diet Control Only Last Foot Exam: 03/21/17    Last eye exam: to be scheduled early 2019   . Dyspnea   . Essential hypertension 12/05/2005   Lisinopril 40mg  Daily, Coreg 25mg  BID.   Marland Kitchen GERD 12/05/2005   Qualifier: Diagnosis of  By: Prudencio Burly MD, Phillip Heal    . GERD (gastroesophageal reflux disease)   . History of blurry vision 06/09   Hospitalized for this  . Hyperlipidemia   . Hypertension   . Personal history of colonic adenomas 10/07/2007  . Pneumonia 01/2014  . 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 Viral URI with cough 03/21/2017   Review of Systems:  Performed and all others negative.  Physical Exam:  Vitals:   04/03/18 1319  BP: (!) 110/51  Pulse: 76  Temp: 97.7 F (36.5 C)  TempSrc: Oral  SpO2: 100%  Weight: 164 lb 4.8 oz (74.5 kg)  Height: 5\' 2"  (1.575 m)   Physical Exam Constitutional:    General: She is not in acute distress.    Appearance: She is well-developed.  Cardiovascular:     Rate and Rhythm: Normal rate and regular rhythm.     Heart sounds: Normal heart sounds.  Pulmonary:     Effort: Pulmonary effort is normal. No respiratory distress.     Breath sounds: Normal breath sounds.  Abdominal:     General: Bowel sounds are normal. There is no distension.     Palpations: Abdomen is soft.     Tenderness: There is no abdominal tenderness.  Musculoskeletal:        General: No deformity.     Comments: Mild LLE pitting edema  Skin:    General: Skin is warm and dry.    Assessment & Plan:   See Encounters Tab for problem based charting.  Patient discussed with Dr. Eppie Gibson

## 2018-04-04 LAB — IRON,TIBC AND FERRITIN PANEL
Ferritin: 447 ng/mL — ABNORMAL HIGH (ref 15–150)
Iron Saturation: 25 % (ref 15–55)
Iron: 60 ug/dL (ref 27–139)
Total Iron Binding Capacity: 237 ug/dL — ABNORMAL LOW (ref 250–450)
UIBC: 177 ug/dL (ref 118–369)

## 2018-04-04 LAB — CBC
Hematocrit: 25.1 % — ABNORMAL LOW (ref 34.0–46.6)
Hemoglobin: 8.4 g/dL — ABNORMAL LOW (ref 11.1–15.9)
MCH: 28 pg (ref 26.6–33.0)
MCHC: 33.5 g/dL (ref 31.5–35.7)
MCV: 84 fL (ref 79–97)
Platelets: 175 10*3/uL (ref 150–450)
RBC: 3 x10E6/uL — ABNORMAL LOW (ref 3.77–5.28)
RDW: 16 % — AB (ref 11.7–15.4)
WBC: 5 10*3/uL (ref 3.4–10.8)

## 2018-07-03 ENCOUNTER — Ambulatory Visit (INDEPENDENT_AMBULATORY_CARE_PROVIDER_SITE_OTHER): Payer: Medicare Other | Admitting: Internal Medicine

## 2018-07-03 ENCOUNTER — Other Ambulatory Visit: Payer: Self-pay

## 2018-07-03 ENCOUNTER — Encounter: Payer: Self-pay | Admitting: Internal Medicine

## 2018-07-03 VITALS — BP 106/58 | HR 73 | Temp 97.8°F | Ht 62.0 in | Wt 161.9 lb

## 2018-07-03 DIAGNOSIS — Z79899 Other long term (current) drug therapy: Secondary | ICD-10-CM

## 2018-07-03 DIAGNOSIS — I5022 Chronic systolic (congestive) heart failure: Secondary | ICD-10-CM

## 2018-07-03 DIAGNOSIS — E119 Type 2 diabetes mellitus without complications: Secondary | ICD-10-CM

## 2018-07-03 DIAGNOSIS — M85852 Other specified disorders of bone density and structure, left thigh: Secondary | ICD-10-CM | POA: Diagnosis not present

## 2018-07-03 DIAGNOSIS — Z7984 Long term (current) use of oral hypoglycemic drugs: Secondary | ICD-10-CM | POA: Diagnosis not present

## 2018-07-03 LAB — POCT GLYCOSYLATED HEMOGLOBIN (HGB A1C): Hemoglobin A1C: 6.8 % — AB (ref 4.0–5.6)

## 2018-07-03 LAB — GLUCOSE, CAPILLARY: Glucose-Capillary: 127 mg/dL — ABNORMAL HIGH (ref 70–99)

## 2018-07-03 NOTE — Assessment & Plan Note (Signed)
Discussed medications with patient. She states she has been able to afford and has been taking daily vitamin D and Calcium. - Continue Vitamin D and Ca supplementation

## 2018-07-03 NOTE — Assessment & Plan Note (Signed)
A1c today 6.8. This is increased from previous of 6.1. Metformin was reduce to 500mg  daily at that visit. Will continue current regimen to see if her A1c has stabilized.   She does report some intermittent diarrhea, described as 3 loose stools every 2-3 days. Given this, will consider low dose SGLT-2-i in the future (due to dual indication of heart failure). - Continue Metformin 500mg  XR Daily - Lifestyle modifications - F/U in 3 months

## 2018-07-03 NOTE — Progress Notes (Signed)
   CC: Diabetes, [place]  HPI:  [name] is a 68 y.o. F with PMHx listed below presenting for Diabetes, [place]. Please see the A&P for the status of the patient's chronic medical problems.  Past Medical History:  Diagnosis Date  . Acute exacerbation of CHF (congestive heart failure) ([place]) 09/12/2017  . Acute gallstone pancreatitis   . Acute gallstone pancreatitis   . Acute on chronic systolic heart failure ([place])   . Bilateral pleural effusion 02/22/2014  . Cardiomyopathy, ischemic, EF 25-30% 03/23/2014   03/01/2014- Date of procedure- CABG- X5- Severe 3-vessel coronary artery disease with severe LV dysfunction. Ejection fraction 25-30%. SURGICAL PROCEDURE: Coronary artery bypass grafting x 5. SURGEON: Lanelle Bal, MD.    . Chronic systolic heart failure ([place])   . Coronary artery disease involving native coronary artery of native heart without angina pectoris   . Diabetes mellitus 1998   Dx in 1998. Microalbuminuria, never on insulin.  . Diabetes mellitus type 2, controlled, without complications ([place]) 53/06/6438   Last A1C 6.7 (03/21/17)    On Diet Control Only Last Foot Exam: 03/21/17    Last eye exam: to be scheduled early 2019   . Dyspnea   . Essential hypertension 12/05/2005   Lisinopril 40mg  Daily, Coreg 25mg  BID.   Marland Kitchen GERD 12/05/2005   Qualifier: Diagnosis of  By: Prudencio Burly MD, Phillip Heal    . GERD (gastroesophageal reflux disease)   . History of blurry vision 06/09   Hospitalized for this  . Hyperlipidemia   . Hypertension   . Non-intractable vomiting 12/31/2017  . Personal history of colonic adenomas 10/07/2007  . Pneumonia 01/2014  . 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 Viral URI with cough 03/21/2017   Review of Systems:  Performed and all others negative.  Physical Exam:  Vitals:   07/03/18 1333  BP: (!) 106/58  Pulse: 73  Temp: 97.8 F (36.6 C)  TempSrc: Oral  SpO2: 100%  Weight: 161 lb 14.4 oz (73.4 kg)  Height: 5\' 2"  (1.575 m)   Physical Exam  Constitutional:      General: She is not in acute distress.    Appearance: Normal appearance.  Cardiovascular:     Rate and Rhythm: Normal rate and regular rhythm.     Pulses: Normal pulses.     Heart sounds: Normal heart sounds.  Pulmonary:     Effort: Pulmonary effort is normal. No respiratory distress.     Breath sounds: Normal breath sounds.  Abdominal:     General: Bowel sounds are normal. There is no distension.     Palpations: Abdomen is soft.     Tenderness: There is no abdominal tenderness.  Musculoskeletal:        General: No deformity.     Left lower leg: Edema present.     Comments: 1+ LLE edema to calf  Skin:    General: Skin is warm and dry.  Neurological:     General: No focal deficit present.     Mental Status: Mental status is at baseline.     Assessment & Plan:   See Encounters Tab for problem based charting.  Patient discussed with Dr. Angelia Mould

## 2018-07-03 NOTE — Patient Instructions (Signed)
Thank you for allowing [place] to care for you  For your Diabetes - A1c is increased today to 6.8, but is not so high that we will change medications - Continue Metformin 500mg  Daily  For your heart failure - Continue current medications - Follow up with cardiology on 7/10  Please follow up with me in about 3 months

## 2018-07-04 NOTE — Progress Notes (Signed)
Internal Medicine Clinic Attending  Case discussed with Dr. Melvin  at the time of the visit.  We reviewed the resident's history and exam and pertinent patient test results.  I agree with the assessment, diagnosis, and plan of care documented in the resident's note.  

## 2018-07-28 ENCOUNTER — Other Ambulatory Visit: Payer: Self-pay | Admitting: Cardiology

## 2018-08-08 ENCOUNTER — Telehealth: Payer: Medicare Other | Admitting: Cardiology

## 2018-08-27 NOTE — Progress Notes (Signed)
Virtual Visit via Video Note changed to phone visit at patient request   This visit type was conducted due to national recommendations for restrictions regarding the COVID-19 Pandemic (e.g. social distancing) in an effort to limit this patient's exposure and mitigate transmission in our community.  Due to her co-morbid illnesses, this patient is at least at moderate risk for complications without adequate follow up.  This format is felt to be most appropriate for this patient at this time.  All issues noted in this document were discussed and addressed.  A limited physical exam was performed with this format.  Please refer to the patient's chart for her consent to telehealth for [facility].   Date:  08/29/2018   ID:  [name], DOB 08/18/1950, MRN [id number]  Patient Location:Home Provider Location: Home  PCP:  Neva Seat, MD  Cardiologist:  Dr Stanford Breed  Evaluation Performed:  Follow-Up Visit  Chief Complaint:  FU CAD  History of Present Illness:    FU CAD; previously admitted with CHF; echo 1/16 showed EF 25-30, mild to moderate MR, mild LAE/RAE/RVE; moderately reduced RV function; small pericardial effusion. Carotid dopplers 1/16 showed 1-39 bilateral stenosis. Cath revealed 3 vessel CAD and EF 30-35. Had CABG with LIMA to LAD, seq svg to first and second diagonal, svg to OM, svg to PDA. Patientpreviouslyreferred to electrophysiology for consideration of ICD but patient declined.Admitted August 2019 with congestive heart failure and diuresed. Last echocardiogram repeated August 2019 and showed ejection fraction 25%, moderate mitral regurgitation, moderate left atrial enlargement, mild to moderate RV dysfunction, mild to moderate tricuspid regurgitation. Since last seen, the patient has dyspnea with more extreme activities but not with routine activities. It is relieved with rest. It is not associated with chest pain. There is no orthopnea, PND or pedal edema. There is  no syncope or palpitations. There is no exertional chest pain.   The patient does not have symptoms concerning for COVID-19 infection (fever, chills, cough, or new shortness of breath).    Past Medical History:  Diagnosis Date  . Acute exacerbation of CHF (congestive heart failure) ([place]) 09/12/2017  . Acute gallstone pancreatitis   . Acute gallstone pancreatitis   . Acute on chronic systolic heart failure ([place])   . Bilateral pleural effusion 02/22/2014  . Cardiomyopathy, ischemic, EF 25-30% 03/23/2014   03/01/2014- Date of procedure- CABG- X5- Severe 3-vessel coronary artery disease with severe LV dysfunction. Ejection fraction 25-30%. SURGICAL PROCEDURE: Coronary artery bypass grafting x 5. SURGEON: Lanelle Bal, MD.    . Chronic systolic heart failure ([place])   . Coronary artery disease involving native coronary artery of native heart without angina pectoris   . Diabetes mellitus 1998   Dx in 1998. Microalbuminuria, never on insulin.  . Diabetes mellitus type 2, controlled, without complications ([place]) 33/08/2503   Last A1C 6.7 (03/21/17)    On Diet Control Only Last Foot Exam: 03/21/17    Last eye exam: to be scheduled early 2019   . Dyspnea   . Essential hypertension 12/05/2005   Lisinopril 40mg  Daily, Coreg 25mg  BID.   Marland Kitchen GERD 12/05/2005   Qualifier: Diagnosis of  By: Prudencio Burly MD, Phillip Heal    . GERD (gastroesophageal reflux disease)   . History of blurry vision 06/09   Hospitalized for this  . Hyperlipidemia   . Hypertension   . Non-intractable vomiting 12/31/2017  . Personal history of colonic adenomas 10/07/2007  . Pneumonia 01/2014  . 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 Viral URI with  cough 03/21/2017   Past Surgical History:  Procedure Laterality Date  . CHOLECYSTECTOMY N/A 08/06/2016   Procedure: LAPAROSCOPIC CHOLECYSTECTOMY WITH INTRAOPERATIVE CHOLANGIOGRAM;  Surgeon: Georganna Skeans, MD;  Location: [place];  Service: General;  Laterality: N/A;  . CORONARY ARTERY BYPASS GRAFT N/A 03/01/2014    Procedure: CORONARY ARTERY BYPASS GRAFTING (CABG) x five, using left internal mammary artery and right leg greater saphenous vein harvested endoscopically;  Surgeon: Grace Isaac, MD;  Location: [place];  Service: Open Heart Surgery;  Laterality: N/A;  . INTRAOPERATIVE TRANSESOPHAGEAL ECHOCARDIOGRAM N/A 03/01/2014   Procedure: INTRAOPERATIVE TRANSESOPHAGEAL ECHOCARDIOGRAM;  Surgeon: Grace Isaac, MD;  Location: [place];  Service: Open Heart Surgery;  Laterality: N/A;  . LEFT AND RIGHT HEART CATHETERIZATION WITH CORONARY ANGIOGRAM N/A 02/26/2014   Procedure: LEFT AND RIGHT HEART CATHETERIZATION WITH CORONARY ANGIOGRAM;  Surgeon: Sinclair Grooms, MD;  Location: [facility] CATH LAB;  Service: Cardiovascular;  Laterality: N/A;  . TOTAL ABDOMINAL HYSTERECTOMY W/ BILATERAL SALPINGOOPHORECTOMY  1996     Current Meds  Medication Sig  . aspirin 81 MG tablet Take 1 tablet (81 mg total) by mouth daily.  Marland Kitchen atorvastatin (LIPITOR) 80 MG tablet TAKE ONE TABLET BY MOUTH DAILY  . calcium carbonate (OS-CAL - DOSED IN MG OF ELEMENTAL CALCIUM) 1250 (500 Ca) MG tablet Take 1 tablet by mouth daily.  . carvedilol (COREG) 25 MG tablet TAKE ONE TABLET BY MOUTH TWICE A DAY WITH A MEAL  . cholecalciferol (VITAMIN D3) 25 MCG (1000 UT) tablet Take 1 tablet by mouth daily.  . furosemide (LASIX) 80 MG tablet Take 1 tablet (80 mg total) by mouth daily.  . metFORMIN (GLUCOPHAGE-XR) 500 MG 24 hr tablet Take 1 tablet (500 mg total) by mouth daily with breakfast.  . sacubitril-valsartan (ENTRESTO) 24-26 MG Take 1 tablet by mouth 2 (two) times daily.  Marland Kitchen spironolactone (ALDACTONE) 25 MG tablet TAKE ONE TABLET BY MOUTH DAILY     Allergies:   Patient has no known allergies.   Social History   Tobacco Use  . Smoking status: Never Smoker  . Smokeless tobacco: Never Used  Substance Use Topics  . Alcohol use: No    Alcohol/week: 0.0 standard drinks  . Drug use: No     Family Hx: The patient's family history includes Diabetes  in her brother and mother; Heart failure in her brother, mother, and sister; Kidney failure in her mother.  ROS:   Please see the history of present illness.    No Fever, chills  or productive cough All other systems reviewed and are negative.   Recent Labs: 09/12/2017: B Natriuretic Peptide 2,171.3 09/14/2017: ALT 75 09/17/2017: Magnesium 2.2 01/13/2018: BUN 24; Creatinine, Ser 1.52; Potassium 4.4; Sodium 144 04/03/2018: Hemoglobin 8.4; Platelets 175   Recent Lipid Panel Lab Results  Component Value Date/Time   CHOL 176 03/08/2017 02:24 PM   TRIG 76 03/08/2017 02:24 PM   HDL 68 03/08/2017 02:24 PM   CHOLHDL 2.6 03/08/2017 02:24 PM   CHOLHDL 2.3 06/26/2016 11:22 AM   LDLCALC 93 03/08/2017 02:24 PM    Wt Readings from Last 3 Encounters:  08/29/18 160 lb (72.6 kg)  07/03/18 161 lb 14.4 oz (73.4 kg)  04/03/18 164 lb 4.8 oz (74.5 kg)     Objective:    Vital Signs:  Ht 5\' 2"  (1.575 m)   Wt 160 lb (72.6 kg)   BMI 29.26 kg/m    VITAL SIGNS:  reviewed NAD Answers questions appropriately Normal affect Remainder of physical examination  not performed (telehealth visit; coronavirus pandemic)  ASSESSMENT & PLAN:    1. Chronic systolic congestive heart failure-patient continues to do well from a symptomatic standpoint.  We will continue with Lasix and spironolactone at present dose.  Continue fluid restriction and low-sodium diet.  Check potassium and renal function. 2. Ischemic cardiomyopathy-continue Entresto and beta-blocker.  Declined ICD previously and understands the higher risk of sudden death. 3. Coronary artery disease-continue aspirin and statin. 4. Hypertension-patient's blood pressure is controlled today.  Continue present medications. 5. Hyperlipidemia-continue statin. Check lipids and liver.  COVID-19 Education: The importance of social distancing was discussed today.  Time:   Today, I have spent 18 minutes with the patient with telehealth technology discussing the  above problems.     Medication Adjustments/Labs and Tests Ordered: Current medicines are reviewed at length with the patient today.  Concerns regarding medicines are outlined above.   Tests Ordered: No orders of the defined types were placed in this encounter.   Medication Changes: No orders of the defined types were placed in this encounter.   Follow Up:  Virtual Visit or In Person in 6 months(s)  Signed, Kirk Ruths, MD  08/29/2018 8:29 AM    [place]

## 2018-08-28 ENCOUNTER — Telehealth: Payer: Self-pay | Admitting: Cardiology

## 2018-08-28 NOTE — Telephone Encounter (Signed)
Virtual Visit Pre-Appointment Phone Call  "(Name), I am calling you today to discuss your upcoming appointment. We are currently trying to limit exposure to the virus that causes COVID-19 by seeing patients at home rather than in the office." 1. -- 2. Confirm consent - "In the setting of the current Covid19 crisis, you are scheduled for a (phone or video) visit with your provider on (date) at (time).  Just as we do with many in-office visits, in order for you to participate in this visit, we must obtain consent.  If you'd like, I can send this to your mychart (if signed up) or email for you to review.  Otherwise, I can obtain your verbal consent now.  All virtual visits are billed to your insurance company just like a normal visit would be.  By agreeing to a virtual visit, we'd like you to understand that the technology does not allow for your provider to perform an examination, and thus may limit your provider's ability to fully assess your condition. If your provider identifies any concerns that need to be evaluated in person, we will make arrangements to do so.  Finally, though the technology is pretty good, we cannot assure that it will always work on either your or our end, and in the setting of a video visit, we may have to convert it to a phone-only visit.  In either situation, we cannot ensure that we have a secure connection.  Are you willing to proceed?" STAFF: Did the patient verbally acknowledge consent to telehealth visit? Document YES/NO here: Yes     I hereby voluntarily request, consent and authorize [facility] and its employed or contracted physicians, physician assistants, nurse practitioners or other licensed health care professionals (the Practitioner), to provide me with telemedicine health care services (the Services") as deemed necessary by the treating Practitioner. I acknowledge and consent to receive the Services by the Practitioner via telemedicine. I understand that  the telemedicine visit will involve communicating with the Practitioner through live audiovisual communication technology and the disclosure of certain medical information by electronic transmission. I acknowledge that I have been given the opportunity to request an in-person assessment or other available alternative prior to the telemedicine visit and am voluntarily participating in the telemedicine visit.  I understand that I have the right to withhold or withdraw my consent to the use of telemedicine in the course of my care at any time, without affecting my right to future care or treatment, and that the Practitioner or I may terminate the telemedicine visit at any time. I understand that I have the right to inspect all information obtained and/or recorded in the course of the telemedicine visit and may receive copies of available information for a reasonable fee.  I understand that some of the potential risks of receiving the Services via telemedicine include:   Delay or interruption in medical evaluation due to technological equipment failure or disruption;  Information transmitted may not be sufficient (e.g. poor resolution of images) to allow for appropriate medical decision making by the Practitioner; and/or   In rare instances, security protocols could fail, causing a breach of personal health information.  Furthermore, I acknowledge that it is my responsibility to provide information about my medical history, conditions and care that is complete and accurate to the best of my ability. I acknowledge that Practitioner's advice, recommendations, and/or decision may be based on factors not within their control, such as incomplete or inaccurate data provided by me or  distortions of diagnostic images or specimens that may result from electronic transmissions. I understand that the practice of medicine is not an exact science and that Practitioner makes no warranties or guarantees regarding treatment  outcomes. I acknowledge that I will receive a copy of this consent concurrently upon execution via email to the email address I last provided but may also request a printed copy by calling the office of [place].    I understand that my insurance will be billed for this visit.   I have read or had this consent read to me.  I understand the contents of this consent, which adequately explains the benefits and risks of the Services being provided via telemedicine.   I have been provided ample opportunity to ask questions regarding this consent and the Services and have had my questions answered to my satisfaction.  I give my informed consent for the services to be provided through the use of telemedicine in my medical care  By participating in this telemedicine visit I agree to the above.

## 2018-08-29 ENCOUNTER — Encounter: Payer: Self-pay | Admitting: Cardiology

## 2018-08-29 ENCOUNTER — Telehealth (INDEPENDENT_AMBULATORY_CARE_PROVIDER_SITE_OTHER): Payer: Medicare Other | Admitting: Cardiology

## 2018-08-29 VITALS — Ht 62.0 in | Wt 160.0 lb

## 2018-08-29 DIAGNOSIS — I10 Essential (primary) hypertension: Secondary | ICD-10-CM | POA: Diagnosis not present

## 2018-08-29 DIAGNOSIS — I2510 Atherosclerotic heart disease of native coronary artery without angina pectoris: Secondary | ICD-10-CM | POA: Diagnosis not present

## 2018-08-29 DIAGNOSIS — E7800 Pure hypercholesterolemia, unspecified: Secondary | ICD-10-CM

## 2018-08-29 DIAGNOSIS — I5022 Chronic systolic (congestive) heart failure: Secondary | ICD-10-CM | POA: Diagnosis not present

## 2018-08-29 NOTE — Patient Instructions (Signed)
Medication Instructions:  NO CHANGE If you need a refill on your cardiac medications before your next appointment, please call your pharmacy.   Lab work: Your physician recommends that you return for lab work WHEN FASTING  If you have labs (blood work) drawn today and your tests are completely normal, you will receive your results only by: Marland Kitchen MyChart Message (if you have MyChart) OR . A paper copy in the mail If you have any lab test that is abnormal or we need to change your treatment, we will call you to review the results.  Follow-Up: At [facility], you and your health needs are our priority.  As part of our continuing mission to provide you with exceptional heart care, we have created designated Provider Care Teams.  These Care Teams include your primary Cardiologist (physician) and Advanced Practice Providers (APPs -  Physician Assistants and Nurse Practitioners) who all work together to provide you with the care you need, when you need it. You will need a follow up appointment in 6 months.  Please call our office 2 months in advance to schedule this appointment.  You may see Kirk Ruths MD or one of the following Advanced Practice Providers on your designated Care Team:   Kerin Ransom, PA-C Roby Lofts, [place] . Sande Rives, PA-C

## 2018-09-21 ENCOUNTER — Other Ambulatory Visit: Payer: Self-pay | Admitting: Cardiology

## 2018-10-02 ENCOUNTER — Encounter: Payer: Medicare Other | Admitting: Internal Medicine

## 2018-10-02 ENCOUNTER — Encounter: Payer: Self-pay | Admitting: Internal Medicine

## 2018-10-02 ENCOUNTER — Telehealth: Payer: Self-pay | Admitting: Internal Medicine

## 2018-10-02 NOTE — Assessment & Plan Note (Deleted)
A1c today ----. This is ----- from previous of 6.8. Currently on Metformin 500mg  Daily. Due to ongoing GI side effects, will trial low dose Jardiance given dual indication of heart failure for AGLT-2-I class of medications. - Jardiance (Empagliflozin) 10mg  Daily  -------

## 2018-10-02 NOTE — Telephone Encounter (Signed)
Patient missed appointment today. Attempted to contact patient to offer telephone visit, but there was no answer.  Pearson Grippe, DO IM PGY-3

## 2018-10-02 NOTE — Assessment & Plan Note (Deleted)
Heart failure remains controlled on current regimen. Dry wt ~78kg, weight today ------. Continues to have mild LE edema that resolves with leg elevation. Seen by cardiology recently, will continue current regimen. - Coreg 25mg  BID - Entresto 24/26mg  BID - Spironolactone 25mg  Daily - Lasix 80mg  Daily - Take Lasix 40mg  PRN for weight gain of 2-3lbs - Continue Atorvastatin 80mg  Daily and ASA 81mg  Daily for CAD - Follow up with Cardiology

## 2018-10-29 ENCOUNTER — Other Ambulatory Visit: Payer: Self-pay | Admitting: Internal Medicine

## 2018-10-29 ENCOUNTER — Other Ambulatory Visit: Payer: Self-pay | Admitting: Cardiology

## 2018-10-29 DIAGNOSIS — E785 Hyperlipidemia, unspecified: Secondary | ICD-10-CM

## 2018-10-29 NOTE — Telephone Encounter (Signed)
Refill approved.

## 2018-11-24 ENCOUNTER — Other Ambulatory Visit: Payer: Self-pay

## 2018-11-24 ENCOUNTER — Ambulatory Visit (INDEPENDENT_AMBULATORY_CARE_PROVIDER_SITE_OTHER): Payer: Medicare Other | Admitting: Internal Medicine

## 2018-11-24 ENCOUNTER — Encounter: Payer: Self-pay | Admitting: Internal Medicine

## 2018-11-24 VITALS — BP 119/68 | HR 78 | Temp 97.5°F | Ht 62.0 in | Wt 173.5 lb

## 2018-11-24 DIAGNOSIS — E875 Hyperkalemia: Secondary | ICD-10-CM

## 2018-11-24 DIAGNOSIS — I5022 Chronic systolic (congestive) heart failure: Secondary | ICD-10-CM

## 2018-11-24 DIAGNOSIS — E119 Type 2 diabetes mellitus without complications: Secondary | ICD-10-CM | POA: Diagnosis not present

## 2018-11-24 DIAGNOSIS — Z23 Encounter for immunization: Secondary | ICD-10-CM

## 2018-11-24 DIAGNOSIS — N179 Acute kidney failure, unspecified: Secondary | ICD-10-CM

## 2018-11-24 DIAGNOSIS — Z7984 Long term (current) use of oral hypoglycemic drugs: Secondary | ICD-10-CM

## 2018-11-24 DIAGNOSIS — Z0000 Encounter for general adult medical examination without abnormal findings: Secondary | ICD-10-CM

## 2018-11-24 LAB — POCT GLYCOSYLATED HEMOGLOBIN (HGB A1C): Hemoglobin A1C: 7.5 % — AB (ref 4.0–5.6)

## 2018-11-24 LAB — GLUCOSE, CAPILLARY: Glucose-Capillary: 116 mg/dL — ABNORMAL HIGH (ref 70–99)

## 2018-11-24 MED ORDER — JARDIANCE 10 MG PO TABS: 10.0000 mg | ORAL_TABLET | Freq: Every day | ORAL | 0 refills | Status: DC

## 2018-11-24 NOTE — Assessment & Plan Note (Signed)
Flu shot given

## 2018-11-24 NOTE — Assessment & Plan Note (Addendum)
A1c today is 7.6, which is increased from 6.5 at last visit. She is currently on Metformin 536m Daily and has had intermittent episodes of diarrhea despite being on the medication for some time. She states the diarrhea improved when she discontinued the medicine for a time in the past. We will work to transition her to an SGLT-2-I for dual indication of heart failure and tor educe side effects. Will continue Metformin for now and stop this when we increase SGLT-2-I dose at follow up if A1c is stable. - Metformin 5080mDaily, plan to stop at follow up if A1c is stable - START Jardiance 1027maily, plan to up-titrate at follow up - BMP (jardiance contraindicated with GFR <30, last eGFR was in 40s62s

## 2018-11-24 NOTE — Patient Instructions (Addendum)
Thank you for allowing [place] to care for you  For your Diabetes - A1c is elevated today - We will add Jardiance 10mg  Daily - Follow up in 3 months - We will recheck kidney function today   Please follow up in 3 months

## 2018-11-24 NOTE — Progress Notes (Signed)
   CC: Diabetes, preventative health care   HPI:  [name] is a 68 y.o. F with PMHx listed below presenting for Diabetes, preventative health care. Please see the A&P for the status of the patient's chronic medical problems.   Past Medical History:  Diagnosis Date  . Acute exacerbation of CHF (congestive heart failure) ([place]) 09/12/2017  . Acute gallstone pancreatitis   . Acute gallstone pancreatitis   . Acute on chronic systolic heart failure ([place])   . Bilateral pleural effusion 02/22/2014  . Cardiomyopathy, ischemic, EF 25-30% 03/23/2014   03/01/2014- Date of procedure- CABG- X5- Severe 3-vessel coronary artery disease with severe LV dysfunction. Ejection fraction 25-30%. SURGICAL PROCEDURE: Coronary artery bypass grafting x 5. SURGEON: Lanelle Bal, MD.    . Cardiomyopathy, ischemic, EF 25-30% 03/23/2014   03/01/2014- Date of procedure- CABG- X5- Severe 3-vessel coronary artery disease with severe LV dysfunction. Ejection fraction 25-30%. SURGICAL PROCEDURE: Coronary artery bypass grafting x 5. SURGEON: Lanelle Bal, MD.    . Chronic systolic heart failure ([name])   . Coronary artery disease involving native coronary artery of native heart without angina pectoris   . Diabetes mellitus 1998   Dx in 1998. Microalbuminuria, never on insulin.  . Diabetes mellitus type 2, controlled, without complications ([name]) [id number]   Last A1C 6.7 (03/21/17)    On Diet Control Only Last Foot Exam: 03/21/17    Last eye exam: to be scheduled early 2019   . Dyspnea   . Essential hypertension 12/05/2005   Lisinopril 40mg  Daily, Coreg 25mg  BID.   Marland Kitchen GERD 12/05/2005   Qualifier: Diagnosis of  By: Julia Burly MD, [name]    . GERD (gastroesophageal reflux disease)   . History of blurry vision 06/09   Hospitalized for this  . Hyperlipidemia   . Hypertension   . Non-intractable vomiting 12/31/2017  . Personal history of colonic adenomas 10/07/2007  . Pneumonia 01/2014  . 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  Viral URI with cough 03/21/2017   Review of Systems:  Performed and all others negative.  Physical Exam:  Vitals:   11/24/18 1324  BP: 119/68  Pulse: 78  Temp: (!) 97.5 F (36.4 C)  TempSrc: Oral  SpO2: 100%  Weight: 173 lb 8 oz (78.7 kg)  Height: 5\' 2"  (1.575 m)   Physical Exam Constitutional:      General: She is not in acute distress.    Appearance: Normal appearance.  Cardiovascular:     Rate and Rhythm: Normal rate and regular rhythm.     Pulses: Normal pulses.     Heart sounds: Normal heart sounds.  Pulmonary:     Effort: Pulmonary effort is normal. No respiratory distress.     Breath sounds: Normal breath sounds.  Abdominal:     General: Bowel sounds are normal. There is no distension.     Palpations: Abdomen is soft.     Tenderness: There is no abdominal tenderness.  Musculoskeletal:        General: No deformity.     Right lower leg: Edema present.     Left lower leg: Edema present.     Comments: 1+ LE edema to calf  Skin:    General: Skin is warm and dry.  Neurological:     General: No focal deficit present.     Mental Status: Mental status is at baseline.    Assessment & Plan:   See Encounters Tab for problem based charting.  Patient discussed with Dr. Lynnae January

## 2018-11-25 ENCOUNTER — Telehealth: Payer: Self-pay | Admitting: Internal Medicine

## 2018-11-25 LAB — BMP8+ANION GAP
Anion Gap: 13 mmol/L (ref 10.0–18.0)
BUN/Creatinine Ratio: 17 (ref 12–28)
BUN: 37 mg/dL — ABNORMAL HIGH (ref 8–27)
CO2: 23 mmol/L (ref 20–29)
Calcium: 9.2 mg/dL (ref 8.7–10.3)
Chloride: 103 mmol/L (ref 96–106)
Creatinine, Ser: 2.17 mg/dL — ABNORMAL HIGH (ref 0.57–1.00)
GFR calc Af Amer: 26 mL/min/{1.73_m2} — ABNORMAL LOW (ref >59)
GFR calc non Af Amer: 23 mL/min/{1.73_m2} — ABNORMAL LOW (ref >59)
Glucose: 117 mg/dL — ABNORMAL HIGH (ref 65–99)
Potassium: 5.4 mmol/L — ABNORMAL HIGH (ref 3.5–5.2)
Sodium: 139 mmol/L (ref 134–144)

## 2018-11-25 NOTE — Assessment & Plan Note (Addendum)
Patient's weight was noted to be elevated, about 10lbs from last visit, after she left clinic. She has not had any worsening of symptoms per her report. And exam was benign other than mild increase in LE edema. BMP was done for evaluation of eGFR given initiation of SGLT-2-I and this showed AKI and elevated K. Will have patient take additional dose lasix for 2 days and follow up later this week. - Spoke with patient by phone and she agrees to plan.

## 2018-11-25 NOTE — Telephone Encounter (Signed)
Patient and daughter were contacted by phone and patient was instructed to take extra dose of her lasix in the evening for 2 days and follow up for repeat labs and weight check.   She was also instructed not to start her new diabetes medication due to the drop in her eGFR in setting of AKI likely due to HF exacerbation.

## 2018-11-26 NOTE — Progress Notes (Signed)
Internal Medicine Clinic Attending  Case discussed with Dr. Melvin  at the time of the visit.  We reviewed the resident's history and exam and pertinent patient test results.  I agree with the assessment, diagnosis, and plan of care documented in the resident's note.  

## 2018-11-28 ENCOUNTER — Ambulatory Visit (INDEPENDENT_AMBULATORY_CARE_PROVIDER_SITE_OTHER): Payer: Medicare Other | Admitting: Internal Medicine

## 2018-11-28 ENCOUNTER — Other Ambulatory Visit: Payer: Self-pay

## 2018-11-28 ENCOUNTER — Encounter: Payer: Self-pay | Admitting: Internal Medicine

## 2018-11-28 VITALS — BP 123/67 | HR 77 | Temp 97.8°F | Ht 62.0 in | Wt 171.3 lb

## 2018-11-28 DIAGNOSIS — Z79899 Other long term (current) drug therapy: Secondary | ICD-10-CM

## 2018-11-28 DIAGNOSIS — Z7982 Long term (current) use of aspirin: Secondary | ICD-10-CM

## 2018-11-28 DIAGNOSIS — I5022 Chronic systolic (congestive) heart failure: Secondary | ICD-10-CM

## 2018-11-28 MED ORDER — FUROSEMIDE 80 MG PO TABS: 80.0000 mg | ORAL_TABLET | Freq: Every day | ORAL | 2 refills | Status: DC

## 2018-11-28 NOTE — Assessment & Plan Note (Addendum)
Patient presenting for follow up of CHF with mild exacerbation. She was noted to be about 10lbs over dry weight at recent visit (173lbs from ~163lbs) and her BMP showed AKI (2.17 from about 1.6) with K of 5.4. She was instructed to take additional dose of Lasix in the evenings for two days and return for follow up and repeat labs.  She has taken her medication as instructed and weight to day is improved to 171 with mild increase in UOP. She remains asymptomatic and lungs are clear but she continues to have increased LE edema. Will have her take a further 3 days of BID therapy and return for follow up next week.  - Lasix 80mg  BID x 3 days, then Daily - Coreg 25mg  BID - Entresto 24/26mg  BID - Spironolactone 25mg  Daily - ASA 81mg  Daily - BMP - Follow up in about 4 days  ADDENDUM: BMP improving with Cr 1.8 and K now WNL. Will continue with plan to follow up this week.

## 2018-11-28 NOTE — Progress Notes (Signed)
   CC: Heart Failure  HPI:  [name] is a 68 y.o. F with PMHx listed below presenting for Heart Failure. Please see the A&P for the status of the patient's chronic medical problems.  Past Medical History:  Diagnosis Date  . Acute exacerbation of CHF (congestive heart failure) ([place]) 09/12/2017  . Acute gallstone pancreatitis   . Acute gallstone pancreatitis   . Acute on chronic systolic heart failure ([place])   . Bilateral pleural effusion 02/22/2014  . Cardiomyopathy, ischemic, EF 25-30% 03/23/2014   03/01/2014- Date of procedure- CABG- X5- Severe 3-vessel coronary artery disease with severe LV dysfunction. Ejection fraction 25-30%. SURGICAL PROCEDURE: Coronary artery bypass grafting x 5. SURGEON: Lanelle Bal, MD.    . Cardiomyopathy, ischemic, EF 25-30% 03/23/2014   03/01/2014- Date of procedure- CABG- X5- Severe 3-vessel coronary artery disease with severe LV dysfunction. Ejection fraction 25-30%. SURGICAL PROCEDURE: Coronary artery bypass grafting x 5. SURGEON: Lanelle Bal, MD.    . Chronic systolic heart failure ([place])   . Coronary artery disease involving native coronary artery of native heart without angina pectoris   . Diabetes mellitus 1998   Dx in 1998. Microalbuminuria, never on insulin.  . Diabetes mellitus type 2, controlled, without complications ([place]) 123456   Last A1C 6.7 (03/21/17)    On Diet Control Only Last Foot Exam: 03/21/17    Last eye exam: to be scheduled early 2019   . Dyspnea   . Essential hypertension 12/05/2005   Lisinopril 40mg  Daily, Coreg 25mg  BID.   Marland Kitchen GERD 12/05/2005   Qualifier: Diagnosis of  By: Prudencio Burly MD, Phillip Heal    . GERD (gastroesophageal reflux disease)   . History of blurry vision 06/09   Hospitalized for this  . Hyperlipidemia   . Hypertension   . Non-intractable vomiting 12/31/2017  . Personal history of colonic adenomas 10/07/2007  . Pneumonia 01/2014  . 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 Viral URI with cough 03/21/2017   Review of  Systems:  Performed and all others negative.  Physical Exam:  Vitals:   11/28/18 1028  BP: 123/67  Pulse: 77  Temp: 97.8 F (36.6 C)  TempSrc: Oral  SpO2: 100%  Weight: 171 lb 4.8 oz (77.7 kg)  Height: 5\' 2"  (1.575 m)   Physical Exam Constitutional:      General: She is not in acute distress.    Appearance: Normal appearance.  Cardiovascular:     Rate and Rhythm: Normal rate and regular rhythm.     Pulses: Normal pulses.     Heart sounds: Normal heart sounds.  Pulmonary:     Effort: Pulmonary effort is normal. No respiratory distress.     Breath sounds: Normal breath sounds.  Abdominal:     General: Bowel sounds are normal. There is no distension.     Palpations: Abdomen is soft.     Tenderness: There is no abdominal tenderness.  Musculoskeletal:        General: No deformity.     Right lower leg: Edema present.     Left lower leg: Edema present.     Comments: 2+ LE edema to calf  Skin:    General: Skin is warm and dry.  Neurological:     General: No focal deficit present.     Mental Status: Mental status is at baseline.    Assessment & Plan:   See Encounters Tab for problem based charting.  Patient discussed with Dr. Heber [place]

## 2018-11-28 NOTE — Patient Instructions (Addendum)
Thank you for allowing [place] to care for you  For your heart failure - Weight is improving today, but not back to baseline - Rechecking labs today - Take an extra dose of 80mg  in the evening for 3 days and follow up next week

## 2018-11-29 LAB — BMP8+ANION GAP
Anion Gap: 18 mmol/L (ref 10.0–18.0)
BUN/Creatinine Ratio: 19 (ref 12–28)
BUN: 36 mg/dL — ABNORMAL HIGH (ref 8–27)
CO2: 25 mmol/L (ref 20–29)
Calcium: 9.3 mg/dL (ref 8.7–10.3)
Chloride: 98 mmol/L (ref 96–106)
Creatinine, Ser: 1.88 mg/dL — ABNORMAL HIGH (ref 0.57–1.00)
GFR calc Af Amer: 31 mL/min/{1.73_m2} — ABNORMAL LOW (ref >59)
GFR calc non Af Amer: 27 mL/min/{1.73_m2} — ABNORMAL LOW (ref >59)
Glucose: 140 mg/dL — ABNORMAL HIGH (ref 65–99)
Potassium: 4.2 mmol/L (ref 3.5–5.2)
Sodium: 141 mmol/L (ref 134–144)

## 2018-12-01 NOTE — Progress Notes (Signed)
Internal Medicine Clinic Attending  Case discussed with Dr. Melvin  at the time of the visit.  We reviewed the resident's history and exam and pertinent patient test results.  I agree with the assessment, diagnosis, and plan of care documented in the resident's note.  

## 2018-12-02 ENCOUNTER — Other Ambulatory Visit: Payer: Self-pay | Admitting: *Deleted

## 2018-12-02 DIAGNOSIS — E119 Type 2 diabetes mellitus without complications: Secondary | ICD-10-CM

## 2018-12-03 ENCOUNTER — Ambulatory Visit: Payer: Medicare Other

## 2018-12-03 MED ORDER — METFORMIN HCL ER 500 MG PO TB24: 500.0000 mg | ORAL_TABLET | Freq: Every day | ORAL | 3 refills | Status: DC

## 2018-12-03 NOTE — Telephone Encounter (Signed)
Refill approved.

## 2018-12-10 ENCOUNTER — Other Ambulatory Visit: Payer: Self-pay

## 2018-12-10 ENCOUNTER — Encounter: Payer: Self-pay | Admitting: Internal Medicine

## 2018-12-10 ENCOUNTER — Ambulatory Visit (INDEPENDENT_AMBULATORY_CARE_PROVIDER_SITE_OTHER): Payer: Medicare Other | Admitting: Internal Medicine

## 2018-12-10 VITALS — BP 103/69 | HR 95 | Temp 98.1°F | Ht 62.0 in | Wt 167.1 lb

## 2018-12-10 DIAGNOSIS — I255 Ischemic cardiomyopathy: Secondary | ICD-10-CM

## 2018-12-10 DIAGNOSIS — E785 Hyperlipidemia, unspecified: Secondary | ICD-10-CM

## 2018-12-10 DIAGNOSIS — E119 Type 2 diabetes mellitus without complications: Secondary | ICD-10-CM | POA: Diagnosis not present

## 2018-12-10 DIAGNOSIS — I5022 Chronic systolic (congestive) heart failure: Secondary | ICD-10-CM

## 2018-12-10 DIAGNOSIS — I110 Hypertensive heart disease with heart failure: Secondary | ICD-10-CM

## 2018-12-10 DIAGNOSIS — Z79899 Other long term (current) drug therapy: Secondary | ICD-10-CM

## 2018-12-10 DIAGNOSIS — Z7982 Long term (current) use of aspirin: Secondary | ICD-10-CM

## 2018-12-10 NOTE — Progress Notes (Signed)
CC: Heart Failure  HPI: [name] is a 68 y.o. F w/ PMH of HFrEF (EF 25-30), ischemic cardiomyopathy, T2DM, HTN, HLD who presents for follow up for heart failure exacerbation. Since her last visit she has continued to have good urine output and has lost additional 4 pounds. Mentions taking furosemide 80mg  twice daily as instructed until this Sunday when she switched back to 80mg  daily. She mentions no significant dyspnea, orthopnea, chest pain, palpitations, nausea, vomiting.  Past Medical History:  Diagnosis Date  . Acute exacerbation of CHF (congestive heart failure) ([place]) 09/12/2017  . Acute gallstone pancreatitis   . Acute gallstone pancreatitis   . Acute on chronic systolic heart failure ([place])   . Bilateral pleural effusion 02/22/2014  . Cardiomyopathy, ischemic, EF 25-30% 03/23/2014   03/01/2014- Date of procedure- CABG- X5- Severe 3-vessel coronary artery disease with severe LV dysfunction. Ejection fraction 25-30%. SURGICAL PROCEDURE: Coronary artery bypass grafting x 5. SURGEON: Lanelle Bal, MD.    . Cardiomyopathy, ischemic, EF 25-30% 03/23/2014   03/01/2014- Date of procedure- CABG- X5- Severe 3-vessel coronary artery disease with severe LV dysfunction. Ejection fraction 25-30%. SURGICAL PROCEDURE: Coronary artery bypass grafting x 5. SURGEON: Lanelle Bal, MD.    . Chronic systolic heart failure ([place])   . Coronary artery disease involving native coronary artery of native heart without angina pectoris   . Diabetes mellitus 1998   Dx in 1998. Microalbuminuria, never on insulin.  . Diabetes mellitus type 2, controlled, without complications ([place]) 123456   Last A1C 6.7 (03/21/17)    On Diet Control Only Last Foot Exam: 03/21/17    Last eye exam: to be scheduled early 2019   . Dyspnea   . Essential hypertension 12/05/2005   Lisinopril 40mg  Daily, Coreg 25mg  BID.   Marland Kitchen GERD 12/05/2005   Qualifier: Diagnosis of  By: Prudencio Burly MD, Phillip Heal    . GERD (gastroesophageal  reflux disease)   . History of blurry vision 06/09   Hospitalized for this  . Hyperlipidemia   . Hypertension   . Non-intractable vomiting 12/31/2017  . Personal history of colonic adenomas 10/07/2007  . Pneumonia 01/2014  . 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 Viral URI with cough 03/21/2017   Review of Systems: Review of Systems  Constitutional: Negative for chills, fever and malaise/fatigue.  Respiratory: Negative for shortness of breath.   Cardiovascular: Positive for leg swelling. Negative for chest pain, palpitations and orthopnea.  Gastrointestinal: Negative for constipation, diarrhea, nausea and vomiting.  Neurological: Negative for dizziness.     Physical Exam: Vitals:   12/10/18 1323  BP: 103/69  Pulse: 95  Temp: 98.1 F (36.7 C)  TempSrc: Oral  SpO2: 100%  Weight: 167 lb 1.6 oz (75.8 kg)  Height: 5\' 2"  (1.575 m)   Physical Exam  Constitutional: She is oriented to person, place, and time. She appears well-developed and well-nourished. No distress.  HENT:  Mouth/Throat: Oropharynx is clear and moist.  Eyes: Conjunctivae are normal.  Neck: Normal range of motion. Neck supple. No JVD present.  Cardiovascular: Normal rate, regular rhythm and intact distal pulses.  No murmur heard. Respiratory: Effort normal and breath sounds normal. She has no wheezes. She has no rales.  GI: Soft. Bowel sounds are normal. She exhibits no distension. There is no abdominal tenderness.  Musculoskeletal: Normal range of motion.        General: Edema (trace pitting ankle edema bilaterally) present.  Neurological: She is alert and oriented to person, place, and time.  Skin:  Skin is warm and dry.      Assessment & Plan:   Chronic systolic heart failure (HCC) Presents for f/u of CHF exacerbation. Per chart review, Dr.Melvin instructed her to take furosemide 80mg  BID for 3 days then daily. [name] has been adherent to her instructed medication regimen and endorse continuing improvement in leg  edema and weight. Weight in clinic today at 167lbs (down from 171 last visit) Dry weight 163lbs. Discussed with [name] on importance of avoiding salt and daily weight monitoring.  - C/w coreg, entresto, spironolactone, aspirin - C/w furosemide 80mg  daily (instructed to take BID if weight gain >3lbs in 24 hrs) - C/w DASH diet - Bmp today   Patient discussed with Dr. Dareen Piano   -Gilberto Better, PGY2 [facility] Pager: [phone]

## 2018-12-10 NOTE — Patient Instructions (Signed)
Thank you for allowing [place] to provide your care today. Today we discussed your heart failure    I have ordered bmp labs for you. I will call if any are abnormal.    Today we made no changes to your medications.    Please take your home meds as prescribed  Please follow-up in 3 months.    Should you have any questions or concerns please call the internal medicine clinic at [phone].     DASH Eating Plan DASH stands for "Dietary Approaches to Stop Hypertension." The DASH eating plan is a healthy eating plan that has been shown to reduce high blood pressure (hypertension). It may also reduce your risk for type 2 diabetes, heart disease, and stroke. The DASH eating plan may also help with weight loss. What are tips for following this plan?  General guidelines  Avoid eating more than 2,300 mg (milligrams) of salt (sodium) a day. If you have hypertension, you may need to reduce your sodium intake to 1,500 mg a day.  Limit alcohol intake to no more than 1 drink a day for nonpregnant women and 2 drinks a day for men. One drink equals 12 oz of beer, 5 oz of wine, or 1 oz of hard liquor.  Work with your health care provider to maintain a healthy body weight or to lose weight. Ask what an ideal weight is for you.  Get at least 30 minutes of exercise that causes your heart to beat faster (aerobic exercise) most days of the week. Activities may include walking, swimming, or biking.  Work with your health care provider or diet and nutrition specialist (dietitian) to adjust your eating plan to your individual calorie needs. Reading food labels   Check food labels for the amount of sodium per serving. Choose foods with less than 5 percent of the Daily Value of sodium. Generally, foods with less than 300 mg of sodium per serving fit into this eating plan.  To find whole grains, look for the word "whole" as the first word in the ingredient list. Shopping  Buy products labeled as "low-sodium" or  "no salt added."  Buy fresh foods. Avoid canned foods and premade or frozen meals. Cooking  Avoid adding salt when cooking. Use salt-free seasonings or herbs instead of table salt or sea salt. Check with your health care provider or pharmacist before using salt substitutes.  Do not fry foods. Cook foods using healthy methods such as baking, boiling, grilling, and broiling instead.  Cook with heart-healthy oils, such as olive, canola, soybean, or sunflower oil. Meal planning  Eat a balanced diet that includes: ? 5 or more servings of fruits and vegetables each day. At each meal, try to fill half of your plate with fruits and vegetables. ? Up to 6-8 servings of whole grains each day. ? Less than 6 oz of lean meat, poultry, or fish each day. A 3-oz serving of meat is about the same size as a deck of cards. One egg equals 1 oz. ? 2 servings of low-fat dairy each day. ? A serving of nuts, seeds, or beans 5 times each week. ? Heart-healthy fats. Healthy fats called Omega-3 fatty acids are found in foods such as flaxseeds and coldwater fish, like sardines, salmon, and mackerel.  Limit how much you eat of the following: ? Canned or prepackaged foods. ? Food that is high in trans fat, such as fried foods. ? Food that is high in saturated fat, such as fatty meat. ?  Sweets, desserts, sugary drinks, and other foods with added sugar. ? Full-fat dairy products.  Do not salt foods before eating.  Try to eat at least 2 vegetarian meals each week.  Eat more home-cooked food and less restaurant, buffet, and fast food.  When eating at a restaurant, ask that your food be prepared with less salt or no salt, if possible. What foods are recommended? The items listed may not be a complete list. Talk with your dietitian about what dietary choices are best for you. Grains Whole-grain or whole-wheat bread. Whole-grain or whole-wheat pasta. Brown rice. [place]. Bulgur. Whole-grain and low-sodium  cereals. Pita bread. Low-fat, low-sodium crackers. Whole-wheat flour tortillas. Vegetables Fresh or frozen vegetables (raw, steamed, roasted, or grilled). Low-sodium or reduced-sodium tomato and vegetable juice. Low-sodium or reduced-sodium tomato sauce and tomato paste. Low-sodium or reduced-sodium canned vegetables. Fruits All fresh, dried, or frozen fruit. Canned fruit in natural juice (without added sugar). Meat and other protein foods Skinless chicken or [place]. Ground chicken or [place]. Pork with fat trimmed off. Fish and seafood. Egg whites. Dried beans, peas, or lentils. Unsalted nuts, nut butters, and seeds. Unsalted canned beans. Lean cuts of beef with fat trimmed off. Low-sodium, lean deli meat. Dairy Low-fat (1%) or fat-free (skim) milk. Fat-free, low-fat, or reduced-fat cheeses. Nonfat, low-sodium ricotta or cottage cheese. Low-fat or nonfat yogurt. Low-fat, low-sodium cheese. Fats and oils Soft margarine without trans fats. Vegetable oil. Low-fat, reduced-fat, or light mayonnaise and salad dressings (reduced-sodium). Canola, safflower, olive, soybean, and sunflower oils. Avocado. Seasoning and other foods Herbs. Spices. Seasoning mixes without salt. Unsalted popcorn and pretzels. Fat-free sweets. What foods are not recommended? The items listed may not be a complete list. Talk with your dietitian about what dietary choices are best for you. Grains Baked goods made with fat, such as croissants, muffins, or some breads. Dry pasta or rice meal packs. Vegetables Creamed or fried vegetables. Vegetables in a cheese sauce. Regular canned vegetables (not low-sodium or reduced-sodium). Regular canned tomato sauce and paste (not low-sodium or reduced-sodium). Regular tomato and vegetable juice (not low-sodium or reduced-sodium). Angie Fava. Olives. Fruits Canned fruit in a light or heavy syrup. Fried fruit. Fruit in cream or butter sauce. Meat and other protein foods Fatty cuts of meat. Ribs.  Fried meat. Berniece Salines. Sausage. Bologna and other processed lunch meats. Salami. Fatback. Hotdogs. Bratwurst. Salted nuts and seeds. Canned beans with added salt. Canned or smoked fish. Whole eggs or egg yolks. Chicken or [place] with skin. Dairy Whole or 2% milk, cream, and half-and-half. Whole or full-fat cream cheese. Whole-fat or sweetened yogurt. Full-fat cheese. Nondairy creamers. Whipped toppings. Processed cheese and cheese spreads. Fats and oils Butter. Stick margarine. Lard. Shortening. Ghee. Bacon fat. Tropical oils, such as coconut, palm kernel, or palm oil. Seasoning and other foods Salted popcorn and pretzels. Onion salt, garlic salt, seasoned salt, table salt, and sea salt. Worcestershire sauce. Tartar sauce. Barbecue sauce. Teriyaki sauce. Soy sauce, including reduced-sodium. Steak sauce. Canned and packaged gravies. Fish sauce. Oyster sauce. Cocktail sauce. Horseradish that you find on the shelf. Ketchup. Mustard. Meat flavorings and tenderizers. Bouillon cubes. Hot sauce and Tabasco sauce. Premade or packaged marinades. Premade or packaged taco seasonings. Relishes. Regular salad dressings. Where to find more information:  National Heart, Lung, and [place]: https://wilson-eaton.com/  American Heart Association: www.heart.org Summary  The DASH eating plan is a healthy eating plan that has been shown to reduce high blood pressure (hypertension). It may also reduce your risk for type 2 diabetes,  heart disease, and stroke.  With the DASH eating plan, you should limit salt (sodium) intake to 2,300 mg a day. If you have hypertension, you may need to reduce your sodium intake to 1,500 mg a day.  When on the DASH eating plan, aim to eat more fresh fruits and vegetables, whole grains, lean proteins, low-fat dairy, and heart-healthy fats.  Work with your health care provider or diet and nutrition specialist (dietitian) to adjust your eating plan to your individual calorie needs. This  information is not intended to replace advice given to you by your health care provider. Make sure you discuss any questions you have with your health care provider. Document Released: 01/04/2011 Document Revised: 12/28/2016 Document Reviewed: 01/09/2016 Elsevier Patient Education  2020 [employer].

## 2018-12-10 NOTE — Assessment & Plan Note (Signed)
Presents for f/u of CHF exacerbation. Per chart review, Dr.Melvin instructed her to take furosemide 80mg  BID for 3 days then daily. [name] has been adherent to her instructed medication regimen and endorse continuing improvement in leg edema and weight. Weight in clinic today at 167lbs (down from 171 last visit) Dry weight 163lbs. Discussed with [name] on importance of avoiding salt and daily weight monitoring.  - C/w coreg, entresto, spironolactone, aspirin - C/w furosemide 80mg  daily (instructed to take BID if weight gain >3lbs in 24 hrs) - C/w DASH diet - Bmp today

## 2018-12-11 LAB — BMP8+ANION GAP
Anion Gap: 14 mmol/L (ref 10.0–18.0)
BUN/Creatinine Ratio: 21 (ref 12–28)
BUN: 42 mg/dL — ABNORMAL HIGH (ref 8–27)
CO2: 25 mmol/L (ref 20–29)
Calcium: 9.3 mg/dL (ref 8.7–10.3)
Chloride: 102 mmol/L (ref 96–106)
Creatinine, Ser: 2 mg/dL — ABNORMAL HIGH (ref 0.57–1.00)
GFR calc Af Amer: 29 mL/min/{1.73_m2} — ABNORMAL LOW (ref >59)
GFR calc non Af Amer: 25 mL/min/{1.73_m2} — ABNORMAL LOW (ref >59)
Glucose: 189 mg/dL — ABNORMAL HIGH (ref 65–99)
Potassium: 4.8 mmol/L (ref 3.5–5.2)
Sodium: 141 mmol/L (ref 134–144)

## 2018-12-12 ENCOUNTER — Telehealth: Payer: Self-pay | Admitting: Internal Medicine

## 2018-12-12 NOTE — Telephone Encounter (Signed)
Left voicemail to continue current dose of furosemide. Given callback number if requesting additional questions

## 2018-12-15 NOTE — Progress Notes (Signed)
Internal Medicine Clinic Attending ° °Case discussed with Dr. Lee at the time of the visit.  We reviewed the resident’s history and exam and pertinent patient test results.  I agree with the assessment, diagnosis, and plan of care documented in the resident’s note.  °

## 2019-02-25 ENCOUNTER — Other Ambulatory Visit: Payer: Self-pay | Admitting: Internal Medicine

## 2019-02-25 ENCOUNTER — Other Ambulatory Visit: Payer: Self-pay | Admitting: Cardiology

## 2019-02-25 DIAGNOSIS — E119 Type 2 diabetes mellitus without complications: Secondary | ICD-10-CM

## 2019-02-25 DIAGNOSIS — E785 Hyperlipidemia, unspecified: Secondary | ICD-10-CM

## 2019-02-25 NOTE — Telephone Encounter (Signed)
Refills approved, sig adjusted for Metformin to 500mg  Daily. Rx changed to 30 days as we are planning to stop metformin and possible increase Jardiance at follow pending A1c result at that time.  Patient will need follow up scheduled for this.

## 2019-02-26 ENCOUNTER — Encounter: Payer: Medicare Other | Admitting: Internal Medicine

## 2019-03-09 NOTE — Progress Notes (Signed)
HPI: FU CAD; previously admitted with CHF; echo 1/16 showed EF 25-30, mild to moderate MR, mild LAE/RAE/RVE; moderately reduced RV function; small pericardial effusion. Carotid dopplers 1/16 showed 1-39 bilateral stenosis. Cath revealed 3 vessel CAD and EF 30-35. Had CABG with LIMA to LAD, seq svg to first and second diagonal, svg to OM, svg to PDA. Patient previously referred to electrophysiology for consideration of ICD but patient declined.Admitted August 2019 with congestive heart failure and diuresed. Last echocardiogram repeated August 2019 and showed ejection fraction 25%, moderate mitral regurgitation, moderate left atrial enlargement, mild to moderate RV dysfunction, mild to moderate tricuspid regurgitation. Since last seen,  she denies orthopnea, PND but does have some dyspnea on exertion and pedal edema.  She has not taken her Lasix in the last several days as she ran out.  She denies chest pain.  Current Outpatient Medications  Medication Sig Dispense Refill  . aspirin 81 MG tablet Take 1 tablet (81 mg total) by mouth daily.    Marland Kitchen atorvastatin (LIPITOR) 80 MG tablet TAKE ONE TABLET BY MOUTH DAILY 90 tablet 1  . calcium carbonate (OS-CAL - DOSED IN MG OF ELEMENTAL CALCIUM) 1250 (500 Ca) MG tablet Take 1 tablet by mouth daily.    . carvedilol (COREG) 25 MG tablet TAKE ONE TABLET BY MOUTH TWICE A DAY WITH A MEAL 180 tablet 3  . cholecalciferol (VITAMIN D3) 25 MCG (1000 UT) tablet Take 1 tablet by mouth daily.    Marland Kitchen ENTRESTO 24-26 MG TAKE ONE TABLET BY MOUTH TWICE A DAY 180 tablet 1  . furosemide (LASIX) 80 MG tablet Take 1 tablet (80 mg total) by mouth daily. 90 tablet 2  . JARDIANCE 10 MG TABS tablet TAKE ONE TABLET BY MOUTH DAILY 30 tablet 1  . metFORMIN (GLUCOPHAGE-XR) 500 MG 24 hr tablet Take 1 tablet (500 mg total) by mouth daily with breakfast. 30 tablet 1  . spironolactone (ALDACTONE) 25 MG tablet TAKE ONE TABLET BY MOUTH DAILY 90 tablet 1   No current facility-administered  medications for this visit.     Past Medical History:  Diagnosis Date  . Acute exacerbation of CHF (congestive heart failure) ([place]) 09/12/2017  . Acute gallstone pancreatitis   . Acute gallstone pancreatitis   . Acute on chronic systolic heart failure ([place])   . Bilateral pleural effusion 02/22/2014  . Cardiomyopathy, ischemic, EF 25-30% 03/23/2014   03/01/2014- Date of procedure- CABG- X5- Severe 3-vessel coronary artery disease with severe LV dysfunction. Ejection fraction 25-30%. SURGICAL PROCEDURE: Coronary artery bypass grafting x 5. SURGEON: Lanelle Bal, MD.    . Cardiomyopathy, ischemic, EF 25-30% 03/23/2014   03/01/2014- Date of procedure- CABG- X5- Severe 3-vessel coronary artery disease with severe LV dysfunction. Ejection fraction 25-30%. SURGICAL PROCEDURE: Coronary artery bypass grafting x 5. SURGEON: Lanelle Bal, MD.    . Chronic systolic heart failure ([place])   . Coronary artery disease involving native coronary artery of native heart without angina pectoris   . Diabetes mellitus 1998   Dx in 1998. Microalbuminuria, never on insulin.  . Diabetes mellitus type 2, controlled, without complications ([place]) 123456   Last A1C 6.7 (03/21/17)    On Diet Control Only Last Foot Exam: 03/21/17    Last eye exam: to be scheduled early 2019   . Dyspnea   . Essential hypertension 12/05/2005   Lisinopril 40mg  Daily, Coreg 25mg  BID.   Marland Kitchen GERD 12/05/2005   Qualifier: Diagnosis of  By: Prudencio Burly MD, Phillip Heal    .  GERD (gastroesophageal reflux disease)   . History of blurry vision 06/09   Hospitalized for this  . Hyperlipidemia   . Hypertension   . Non-intractable vomiting 12/31/2017  . Personal history of colonic adenomas 10/07/2007  . Pneumonia 01/2014  . 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 Viral URI with cough 03/21/2017    Past Surgical History:  Procedure Laterality Date  . CHOLECYSTECTOMY N/A 08/06/2016   Procedure: LAPAROSCOPIC CHOLECYSTECTOMY WITH INTRAOPERATIVE CHOLANGIOGRAM;  Surgeon:  Georganna Skeans, MD;  Location: [facility];  Service: General;  Laterality: N/A;  . CORONARY ARTERY BYPASS GRAFT N/A 03/01/2014   Procedure: CORONARY ARTERY BYPASS GRAFTING (CABG) x five, using left internal mammary artery and right leg greater saphenous vein harvested endoscopically;  Surgeon: Grace Isaac, MD;  Location: [facility];  Service: Open Heart Surgery;  Laterality: N/A;  . INTRAOPERATIVE TRANSESOPHAGEAL ECHOCARDIOGRAM N/A 03/01/2014   Procedure: INTRAOPERATIVE TRANSESOPHAGEAL ECHOCARDIOGRAM;  Surgeon: Grace Isaac, MD;  Location: [facility];  Service: Open Heart Surgery;  Laterality: N/A;  . LEFT AND RIGHT HEART CATHETERIZATION WITH CORONARY ANGIOGRAM N/A 02/26/2014   Procedure: LEFT AND RIGHT HEART CATHETERIZATION WITH CORONARY ANGIOGRAM;  Surgeon: Sinclair Grooms, MD;  Location: [facility] CATH LAB;  Service: Cardiovascular;  Laterality: N/A;  . TOTAL ABDOMINAL HYSTERECTOMY W/ BILATERAL SALPINGOOPHORECTOMY  1996    Social History   Socioeconomic History  . Marital status: Widowed    Spouse name: Not on file  . Number of children: Not on file  . Years of education: Not on file  . Highest education level: Not on file  Occupational History  . Occupation: Automotive engineer  Tobacco Use  . Smoking status: Never Smoker  . Smokeless tobacco: Never Used  Substance and Sexual Activity  . Alcohol use: No    Alcohol/week: 0.0 standard drinks  . Drug use: No  . Sexual activity: Not on file  Other Topics Concern  . Not on file  Social History Narrative   Married x 42 yrs.  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   Never smoked. Doesn't drink or use drugs.   Does Patient Exercise:  yes - sometimes      To get diabetes supplies for [name] RD, CDE, April 20,2011   Social Determinants of Health   Financial Resource Strain:   . Difficulty of Paying Living Expenses: Not on file  Food Insecurity:   . Worried About Ship broker in the Last Year: Not on file  . Ran Out of Food in the Last Year: Not on file  Transportation Needs:   . Lack of Transportation (Medical): Not on file  . Lack of Transportation (Non-Medical): Not on file  Physical Activity:   . Days of Exercise per Week: Not on file  . Minutes of Exercise per Session: Not on file  Stress:   . Feeling of Stress : Not on file  Social Connections:   . Frequency of Communication with Friends and Family: Not on file  . Frequency of Social Gatherings with Friends and Family: Not on file  . Attends Religious Services: Not on file  . Active Member of Clubs or Organizations: Not on file  . Attends Archivist Meetings: Not on file  . Marital Status: Not on file  Intimate Partner Violence:   . Fear of Current or Ex-Partner: Not on file  . Emotionally Abused: Not on file  . Physically Abused: Not on file  .  Sexually Abused: Not on file    Family History  Problem Relation Age of Onset  . Diabetes Mother        Mother died of MI at age 77  . Heart failure Mother   . Kidney failure Mother   . Heart failure Sister        Died  . Heart failure Brother        Died  . Diabetes Brother        Died    ROS: no fevers or chills, productive cough, hemoptysis, dysphasia, odynophagia, melena, hematochezia, dysuria, hematuria, rash, seizure activity, orthopnea, PND, claudication. Remaining systems are negative.  Physical Exam: Well-developed well-nourished in no acute distress.  Skin is warm and dry.  HEENT is normal.  Neck is supple.  Chest is clear to auscultation with normal expansion.  Cardiovascular exam is regular rate and rhythm.  Abdominal exam nontender or distended. No masses palpated. Extremities show 1+ edema. neuro grossly intact  ECG-sinus rhythm, left bundle branch block.  Personally reviewed  A/P  1 chronic systolic congestive heart failure-mildly volume overloaded today but has not taken Lasix or spironolactone for 3  days.  Will resume at previous dose.  Check potassium and renal function in 1 week.  2 ischemic cardiomyopathy-continue Entresto and beta-blocker.  Patient continues to decline ICD and understands the higher risk of sudden death.  3 coronary artery disease-continue aspirin and statin.  Patient has not had chest pain.  4 hypertension-blood pressure controlled.  Continue present medical regimen.  5 hyperlipidemia-continue statin.  Check lipids and liver.  6 moderate mitral regurgitation-we will repeat echocardiogram.  Kirk Ruths, MD

## 2019-03-18 ENCOUNTER — Encounter: Payer: Self-pay | Admitting: Cardiology

## 2019-03-18 ENCOUNTER — Other Ambulatory Visit: Payer: Self-pay

## 2019-03-18 ENCOUNTER — Ambulatory Visit (INDEPENDENT_AMBULATORY_CARE_PROVIDER_SITE_OTHER): Payer: Medicare Other | Admitting: Cardiology

## 2019-03-18 VITALS — BP 124/64 | HR 77 | Ht 62.0 in | Wt 179.0 lb

## 2019-03-18 DIAGNOSIS — I10 Essential (primary) hypertension: Secondary | ICD-10-CM | POA: Diagnosis not present

## 2019-03-18 DIAGNOSIS — E7800 Pure hypercholesterolemia, unspecified: Secondary | ICD-10-CM

## 2019-03-18 DIAGNOSIS — I2510 Atherosclerotic heart disease of native coronary artery without angina pectoris: Secondary | ICD-10-CM

## 2019-03-18 DIAGNOSIS — I5022 Chronic systolic (congestive) heart failure: Secondary | ICD-10-CM | POA: Diagnosis not present

## 2019-03-18 MED ORDER — FUROSEMIDE 80 MG PO TABS: 80.0000 mg | ORAL_TABLET | Freq: Every day | ORAL | 3 refills | Status: DC

## 2019-03-18 MED ORDER — SPIRONOLACTONE 25 MG PO TABS: 25.0000 mg | ORAL_TABLET | Freq: Every day | ORAL | 3 refills | Status: DC

## 2019-03-18 NOTE — Patient Instructions (Signed)
Medication Instructions:  NO CHANGE *If you need a refill on your cardiac medications before your next appointment, please call your pharmacy*  Lab Work: Your physician recommends that you return for lab work in: [place]  If you have labs (blood work) drawn today and your tests are completely normal, you will receive your results only by: Marland Kitchen MyChart Message (if you have MyChart) OR . A paper copy in the mail If you have any lab test that is abnormal or we need to change your treatment, we will call you to review the results.  Testing/Procedures: Your physician has requested that you have an echocardiogram. Echocardiography is a painless test that uses sound waves to create images of your heart. It provides your doctor with information about the size and shape of your heart and how well your heart's chambers and valves are working. This procedure takes approximately one hour. There are no restrictions for this procedure.[place]    Follow-Up: At [facility], you and your health needs are our priority.  As part of our continuing mission to provide you with exceptional heart care, we have created designated Provider Care Teams.  These Care Teams include your primary Cardiologist (physician) and Advanced Practice Providers (APPs -  Physician Assistants and Nurse Practitioners) who all work together to provide you with the care you need, when you need it.  Your next appointment:   6 month(s)  The format for your next appointment:   Either In Person or Virtual  Provider:   You may see Kirk Ruths MD or one of the following Advanced Practice Providers on your designated Care Team:    Kerin Ransom, PA-C  [place], [place]  Coletta Memos, [place]

## 2019-03-20 ENCOUNTER — Other Ambulatory Visit (INDEPENDENT_AMBULATORY_CARE_PROVIDER_SITE_OTHER): Payer: Medicare Other

## 2019-03-20 DIAGNOSIS — I10 Essential (primary) hypertension: Secondary | ICD-10-CM | POA: Diagnosis not present

## 2019-03-26 ENCOUNTER — Ambulatory Visit (INDEPENDENT_AMBULATORY_CARE_PROVIDER_SITE_OTHER): Payer: Medicare Other | Admitting: Internal Medicine

## 2019-03-26 ENCOUNTER — Other Ambulatory Visit: Payer: Self-pay | Admitting: Cardiology

## 2019-03-26 VITALS — BP 125/70 | HR 79 | Temp 98.4°F | Wt 173.4 lb

## 2019-03-26 DIAGNOSIS — Z79899 Other long term (current) drug therapy: Secondary | ICD-10-CM

## 2019-03-26 DIAGNOSIS — E119 Type 2 diabetes mellitus without complications: Secondary | ICD-10-CM

## 2019-03-26 DIAGNOSIS — Z7984 Long term (current) use of oral hypoglycemic drugs: Secondary | ICD-10-CM

## 2019-03-26 DIAGNOSIS — Z7982 Long term (current) use of aspirin: Secondary | ICD-10-CM

## 2019-03-26 DIAGNOSIS — I110 Hypertensive heart disease with heart failure: Secondary | ICD-10-CM | POA: Diagnosis not present

## 2019-03-26 DIAGNOSIS — I5022 Chronic systolic (congestive) heart failure: Secondary | ICD-10-CM

## 2019-03-26 DIAGNOSIS — I10 Essential (primary) hypertension: Secondary | ICD-10-CM

## 2019-03-26 LAB — COMPLETE METABOLIC PANEL WITH GFR
AG Ratio: 1.2 (calc) (ref 1.0–2.5)
ALT: 17 U/L (ref 6–29)
AST: 23 U/L (ref 10–35)
Albumin: 3.7 g/dL (ref 3.6–5.1)
Alkaline phosphatase (APISO): 130 U/L (ref 37–153)
BUN/Creatinine Ratio: 19 (calc) (ref 6–22)
BUN: 45 mg/dL — ABNORMAL HIGH (ref 7–25)
CO2: 25 mmol/L (ref 20–32)
Calcium: 9 mg/dL (ref 8.6–10.4)
Chloride: 107 mmol/L (ref 98–110)
Creat: 2.4 mg/dL — ABNORMAL HIGH (ref 0.50–0.99)
GFR, Est African American: 23 mL/min/{1.73_m2} — ABNORMAL LOW (ref >=60)
GFR, Est Non African American: 20 mL/min/{1.73_m2} — ABNORMAL LOW (ref >=60)
Globulin: 3.1 g/dL (calc) (ref 1.9–3.7)
Glucose, Bld: 123 mg/dL — ABNORMAL HIGH (ref 65–99)
Potassium: 5.1 mmol/L (ref 3.5–5.3)
Sodium: 140 mmol/L (ref 135–146)
Total Bilirubin: 0.3 mg/dL (ref 0.2–1.2)
Total Protein: 6.8 g/dL (ref 6.1–8.1)

## 2019-03-26 LAB — LIPID PANEL
Cholesterol: 159 mg/dL (ref <200)
HDL: 60 mg/dL (ref >=50)
LDL Cholesterol (Calc): 84 mg/dL (calc)
Non-HDL Cholesterol (Calc): 99 mg/dL (calc) (ref <130)
Total CHOL/HDL Ratio: 2.7 (calc) (ref <5.0)
Triglycerides: 70 mg/dL (ref <150)

## 2019-03-26 LAB — POCT GLYCOSYLATED HEMOGLOBIN (HGB A1C): Hemoglobin A1C: 7.3 % — AB (ref 4.0–5.6)

## 2019-03-26 LAB — GLUCOSE, CAPILLARY: Glucose-Capillary: 169 mg/dL — ABNORMAL HIGH (ref 70–99)

## 2019-03-26 NOTE — Patient Instructions (Addendum)
Thank you for allowing [place] to care for you  For your heart failure and blood pressure - Continue current medications  For your diabetes - A1c stable today, but needs to come down - Start Jardiance  Schedule eye appointment  Please follow up in about 3 months

## 2019-03-26 NOTE — Progress Notes (Signed)
   CC: Hypertension, Heart Failure, Diabetes  HPI:  [name] is a 69 y.o. F with PMHx listed below presenting for Hypertension, Heart Failure, Diabetes. Please see the A&P for the status of the patient's chronic medical problems.  Past Medical History:  Diagnosis Date  . Acute exacerbation of CHF (congestive heart failure) ([place]) 09/12/2017  . Acute gallstone pancreatitis   . Acute gallstone pancreatitis   . Acute on chronic systolic heart failure ([place])   . Bilateral pleural effusion 02/22/2014  . Cardiomyopathy, ischemic, EF 25-30% 03/23/2014   03/01/2014- Date of procedure- CABG- X5- Severe 3-vessel coronary artery disease with severe LV dysfunction. Ejection fraction 25-30%. SURGICAL PROCEDURE: Coronary artery bypass grafting x 5. SURGEON: Lanelle Bal, MD.    . Cardiomyopathy, ischemic, EF 25-30% 03/23/2014   03/01/2014- Date of procedure- CABG- X5- Severe 3-vessel coronary artery disease with severe LV dysfunction. Ejection fraction 25-30%. SURGICAL PROCEDURE: Coronary artery bypass grafting x 5. SURGEON: Lanelle Bal, MD.    . Chronic systolic heart failure ([place])   . Coronary artery disease involving native coronary artery of native heart without angina pectoris   . Diabetes mellitus 1998   Dx in 1998. Microalbuminuria, never on insulin.  . Diabetes mellitus type 2, controlled, without complications ([place]) 123456   Last A1C 6.7 (03/21/17)    On Diet Control Only Last Foot Exam: 03/21/17    Last eye exam: to be scheduled early 2019   . Dyspnea   . Essential hypertension 12/05/2005   Lisinopril 40mg  Daily, Coreg 25mg  BID.   Marland Kitchen GERD 12/05/2005   Qualifier: Diagnosis of  By: Prudencio Burly MD, Phillip Heal    . GERD (gastroesophageal reflux disease)   . History of blurry vision 06/09   Hospitalized for this  . Hyperlipidemia   . Hypertension   . Non-intractable vomiting 12/31/2017  . Personal history of colonic adenomas 10/07/2007  . Pneumonia 01/2014  . 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    . Viral URI with cough 03/21/2017   Review of Systems:  Performed and all others negative.  Physical Exam:  Vitals:   03/26/19 1412  BP: 125/70  Pulse: 79  Temp: 98.4 F (36.9 C)  TempSrc: Oral  SpO2: 97%  Weight: 173 lb 6.4 oz (78.7 kg)   Physical Exam Constitutional:      General: She is not in acute distress.    Appearance: Normal appearance.  Cardiovascular:     Rate and Rhythm: Normal rate and regular rhythm.     Pulses: Normal pulses.     Heart sounds: Normal heart sounds.  Pulmonary:     Effort: Pulmonary effort is normal. No respiratory distress.     Breath sounds: Normal breath sounds.  Abdominal:     General: Bowel sounds are normal. There is no distension.     Palpations: Abdomen is soft.     Tenderness: There is no abdominal tenderness.  Musculoskeletal:        General: No deformity.     Right lower leg: Edema present.     Left lower leg: Edema present.     Comments: 1+ LE edema to calf L>R  Skin:    General: Skin is warm and dry.  Neurological:     General: No focal deficit present.     Mental Status: Mental status is at baseline.    Assessment & Plan:   See Encounters Tab for problem based charting.  Patient discussed with Dr. Philipp Ovens

## 2019-03-28 ENCOUNTER — Encounter: Payer: Self-pay | Admitting: Internal Medicine

## 2019-03-28 ENCOUNTER — Telehealth: Payer: Self-pay | Admitting: Internal Medicine

## 2019-03-28 NOTE — Assessment & Plan Note (Addendum)
A1c stable at 7.3. She stopped taking Metformin a few weeks ago and has not started her Jardiance yet. She says this is because she does not like taking too many medications (and metformin had given her side effects in the past).  We discussed stopping metformin completely and transitioning fully to Jardiance now, given her stable A1c and dual indication of Heart Failure. She is agreable to starting this medication going forward. - STOP Metformin - START Jardiance 110m Daily - Follow up in 3 months  ADDENDUM: Lab results showed GFR <30, which is a contraindication according to manufacturers labeling (likely due to decreased effective ness at this lower GFR). The medication can continue to show cardiac benefit at eGFR >/= 20. Will contact patient to discuss this result as this may be AKI related to her diuretics, will have her hold the Jardiance for now. Her diuretics are being adjusted by her cardiologist.  ADDENDUM #2: eGFR improved after diuretic adjustment by cardiology (now >30). Will contact patient and have her resume Jardiance.

## 2019-03-28 NOTE — Telephone Encounter (Signed)
Spoke with patients daughter over the phone and advised that we will not be starting the diabetes medication, Jardiance, for now, given her decreased renal function. Her diuretic are being adjusted by her cardiologist and they have repeat labs scheduled, will monitor theses results and consider restarting the medication if renal function improved or switching back to an alternative agent if it does not.

## 2019-03-28 NOTE — Assessment & Plan Note (Addendum)
Heart failure has stabilized since exacerbation in Nov of last year; she was able to be treated as an outpatient at that time. She was seen recently by her cardiologist and is doing well on her current regimen (she had some volume overload at that visit after not taking her diuretic for a few days, which has improved back to her chronic LE edema). We are starting Jardiance for dual indication of HF and DM. - Coreg 25mg  BID - Entresto 24/26mg  BID - Spironolactone 25mg  Daily -> D/C'd by Cardiology, see below - Lasix 80mg  Daily -> Reduced to 40mg  Daily by Cardiology, see below - Jardiance 10mg  Daily -> holding given recent lab results - ASA 81mg  Daily - Take additional Lasix 80mg  PRN for weight gain of 3lbs in 24 hrs - Follow up with Cardiology  ADDENDUM: Lab work ordered by her cardiologist showed declining renal function: - They have Decrease lasix to 40 mg daily; DC spironolactone; and are repeating bmet in one week

## 2019-03-28 NOTE — Assessment & Plan Note (Signed)
BP remains well controlled at 125/70 today, will continue current regimen. -Entresto 24-26mg  BID -Coreg 25mg BID - Spironolactone 25mg  Daily - Lasix 80mg  Daily

## 2019-03-30 NOTE — Progress Notes (Signed)
Internal Medicine Clinic Attending  Case discussed with Dr. Melvin  at the time of the visit.  We reviewed the resident's history and exam and pertinent patient test results.  I agree with the assessment, diagnosis, and plan of care documented in the resident's note.  

## 2019-03-30 NOTE — Addendum Note (Signed)
Addended by: Jodean Lima on: 03/30/2019 01:59 PM   Modules accepted: Level of Service

## 2019-03-31 ENCOUNTER — Telehealth: Payer: Self-pay | Admitting: *Deleted

## 2019-03-31 DIAGNOSIS — I5022 Chronic systolic (congestive) heart failure: Secondary | ICD-10-CM

## 2019-03-31 DIAGNOSIS — E7800 Pure hypercholesterolemia, unspecified: Secondary | ICD-10-CM

## 2019-03-31 MED ORDER — FUROSEMIDE 80 MG PO TABS: 40.0000 mg | ORAL_TABLET | Freq: Every day | ORAL | 3 refills | Status: DC

## 2019-03-31 NOTE — Telephone Encounter (Signed)
Spoke with pt, aware to stop spironolactone and decrease furosemide to 40 mg daily. New script sent to the pharmacy for zetia. Lab orders mailed to the pt

## 2019-03-31 NOTE — Telephone Encounter (Signed)
-----   Message from Lelon Perla, MD sent at 03/27/2019  7:30 AM EST ----- Decrease lasix to 40 mg daily; DC spironolactone; bmet one week; add zetia 10 mg daily; lipids and liver 12 weeks Kirk Ruths

## 2019-04-01 ENCOUNTER — Ambulatory Visit (HOSPITAL_COMMUNITY): Payer: Medicare Other | Attending: Cardiovascular Disease

## 2019-04-01 ENCOUNTER — Other Ambulatory Visit: Payer: Self-pay

## 2019-04-01 DIAGNOSIS — I5022 Chronic systolic (congestive) heart failure: Secondary | ICD-10-CM | POA: Insufficient documentation

## 2019-04-11 LAB — BASIC METABOLIC PANEL
BUN/Creatinine Ratio: 22 (ref 12–28)
BUN: 36 mg/dL — ABNORMAL HIGH (ref 8–27)
CO2: 22 mmol/L (ref 20–29)
Calcium: 8.9 mg/dL (ref 8.7–10.3)
Chloride: 107 mmol/L — ABNORMAL HIGH (ref 96–106)
Creatinine, Ser: 1.67 mg/dL — ABNORMAL HIGH (ref 0.57–1.00)
GFR calc Af Amer: 36 mL/min/{1.73_m2} — ABNORMAL LOW (ref >59)
GFR calc non Af Amer: 31 mL/min/{1.73_m2} — ABNORMAL LOW (ref >59)
Glucose: 220 mg/dL — ABNORMAL HIGH (ref 65–99)
Potassium: 5 mmol/L (ref 3.5–5.2)
Sodium: 141 mmol/L (ref 134–144)

## 2019-05-29 ENCOUNTER — Encounter: Payer: Self-pay | Admitting: *Deleted

## 2019-06-24 ENCOUNTER — Telehealth: Payer: Self-pay | Admitting: *Deleted

## 2019-06-24 DIAGNOSIS — E7800 Pure hypercholesterolemia, unspecified: Secondary | ICD-10-CM

## 2019-06-24 LAB — HEPATIC FUNCTION PANEL
ALT: 12 IU/L (ref 0–32)
AST: 21 IU/L (ref 0–40)
Albumin: 4 g/dL (ref 3.8–4.8)
Alkaline Phosphatase: 103 IU/L (ref 48–121)
Bilirubin Total: 0.3 mg/dL (ref 0.0–1.2)
Bilirubin, Direct: 0.09 mg/dL (ref 0.00–0.40)
Total Protein: 7.9 g/dL (ref 6.0–8.5)

## 2019-06-24 LAB — LIPID PANEL
Chol/HDL Ratio: 2.4 ratio (ref 0.0–4.4)
Cholesterol, Total: 181 mg/dL (ref 100–199)
HDL: 74 mg/dL (ref >39)
LDL Chol Calc (NIH): 93 mg/dL (ref 0–99)
Triglycerides: 78 mg/dL (ref 0–149)
VLDL Cholesterol Cal: 14 mg/dL (ref 5–40)

## 2019-06-24 NOTE — Telephone Encounter (Addendum)
-----   Message from Lelon Perla, MD sent at 06/24/2019  6:59 AM EDT ----- Add zetia 10 mg daily; lipids and liver 12 weeks Kirk Ruths  Left message for pt to call

## 2019-06-25 ENCOUNTER — Encounter: Payer: Self-pay | Admitting: Internal Medicine

## 2019-06-25 ENCOUNTER — Ambulatory Visit (INDEPENDENT_AMBULATORY_CARE_PROVIDER_SITE_OTHER): Payer: Medicare Other | Admitting: Internal Medicine

## 2019-06-25 ENCOUNTER — Other Ambulatory Visit: Payer: Self-pay

## 2019-06-25 VITALS — BP 113/56 | HR 78 | Temp 98.1°F | Ht 62.0 in | Wt 176.6 lb

## 2019-06-25 DIAGNOSIS — Z7984 Long term (current) use of oral hypoglycemic drugs: Secondary | ICD-10-CM

## 2019-06-25 DIAGNOSIS — E119 Type 2 diabetes mellitus without complications: Secondary | ICD-10-CM

## 2019-06-25 DIAGNOSIS — Z0000 Encounter for general adult medical examination without abnormal findings: Secondary | ICD-10-CM | POA: Diagnosis not present

## 2019-06-25 DIAGNOSIS — I5022 Chronic systolic (congestive) heart failure: Secondary | ICD-10-CM | POA: Diagnosis not present

## 2019-06-25 LAB — POCT GLYCOSYLATED HEMOGLOBIN (HGB A1C): Hemoglobin A1C: 7.3 % — AB (ref 4.0–5.6)

## 2019-06-25 LAB — GLUCOSE, CAPILLARY: Glucose-Capillary: 134 mg/dL — ABNORMAL HIGH (ref 70–99)

## 2019-06-25 NOTE — Patient Instructions (Signed)
No AVS as EMR was down at time of visit.

## 2019-06-25 NOTE — Assessment & Plan Note (Signed)
A1C at recent home health visit 6.9, POC A1c here 7.3. She did [employer] a couple of months ago when her AKI resolved. Will continue current regimen and life style modification and consider medication adjustment at follow up. If GFR drops again, amy need to consider alternative regimen due to decreased efficacy. - Jardiance 10mg  Daily - Continue lifestyle modifications

## 2019-06-25 NOTE — Progress Notes (Signed)
   CC: Diabetes  HPI:  [name] is a 69 y.o. F with PMHx listed below presenting for Diabetes. Please see the A&P for the status of the patient's chronic medical problems.  Past Medical History:  Diagnosis Date  . Acute exacerbation of CHF (congestive heart failure) ([place]) 09/12/2017  . Acute gallstone pancreatitis   . Acute gallstone pancreatitis   . Acute on chronic systolic heart failure ([place])   . Bilateral pleural effusion 02/22/2014  . Cardiomyopathy, ischemic, EF 25-30% 03/23/2014   03/01/2014- Date of procedure- CABG- X5- Severe 3-vessel coronary artery disease with severe LV dysfunction. Ejection fraction 25-30%. SURGICAL PROCEDURE: Coronary artery bypass grafting x 5. SURGEON: Lanelle Bal, MD.    . Cardiomyopathy, ischemic, EF 25-30% 03/23/2014   03/01/2014- Date of procedure- CABG- X5- Severe 3-vessel coronary artery disease with severe LV dysfunction. Ejection fraction 25-30%. SURGICAL PROCEDURE: Coronary artery bypass grafting x 5. SURGEON: Lanelle Bal, MD.    . Chronic systolic heart failure ([place])   . Coronary artery disease involving native coronary artery of native heart without angina pectoris   . Diabetes mellitus 1998   Dx in 1998. Microalbuminuria, never on insulin.  . Diabetes mellitus type 2, controlled, without complications ([place]) 92/03/3005   Last A1C 6.7 (03/21/17)    On Diet Control Only Last Foot Exam: 03/21/17    Last eye exam: to be scheduled early 2019   . Dyspnea   . Essential hypertension 12/05/2005   Lisinopril 40mg  Daily, Coreg 25mg  BID.   Marland Kitchen GERD 12/05/2005   Qualifier: Diagnosis of  By: Prudencio Burly MD, Phillip Heal    . GERD (gastroesophageal reflux disease)   . History of blurry vision 06/09   Hospitalized for this  . Hyperlipidemia   . Hypertension   . Non-intractable vomiting 12/31/2017  . Personal history of colonic adenomas 10/07/2007  . Pneumonia 01/2014  . 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 Viral URI with cough 03/21/2017   Review of Systems:   Performed and all others negative.  Physical Exam:  There were no vitals filed for this visit. Physical Exam Constitutional:      General: She is not in acute distress.    Appearance: Normal appearance.  Cardiovascular:     Rate and Rhythm: Normal rate and regular rhythm.     Pulses: Normal pulses.     Heart sounds: Normal heart sounds.  Pulmonary:     Effort: Pulmonary effort is normal. No respiratory distress.     Breath sounds: Normal breath sounds.  Abdominal:     General: Bowel sounds are normal. There is no distension.     Palpations: Abdomen is soft.     Tenderness: There is no abdominal tenderness.  Musculoskeletal:        General: No deformity.     Right lower leg: Edema present.     Left lower leg: Edema present.     Comments: 1+ LE edema to calf L>R  Skin:    General: Skin is warm and dry.  Neurological:     General: No focal deficit present.     Mental Status: Mental status is at baseline.    Assessment & Plan:   See Encounters Tab for problem based charting.  Patient discussed with Dr. Evette Doffing

## 2019-06-25 NOTE — Assessment & Plan Note (Addendum)
Patient is doing well on new regimen, diuretic adjusted due to AKI. He AKI improved with the medications changes (Cr 2.4 >> 1.6 and EGFR >30). Usual mild-moderate LE edema, Lungs clear, no SOB. Continue current regimen. Jardiance restarted when AKI resolved. - Coreg 6m BID - Entresto 24/224mBID - Lasix 4068maily - Jardiance 71m40mily - ASA 81mg70mly - Take Lasix PRN for weight gain of 3lbs in 24 hrs - Follow up with Cardiology

## 2019-06-25 NOTE — Assessment & Plan Note (Signed)
Foot exam today. 

## 2019-06-26 NOTE — Progress Notes (Signed)
Internal Medicine Clinic Attending  Case discussed with Dr. Melvin  at the time of the visit.  We reviewed the resident's history and exam and pertinent patient test results.  I agree with the assessment, diagnosis, and plan of care documented in the resident's note.  

## 2019-06-30 ENCOUNTER — Encounter: Payer: Self-pay | Admitting: *Deleted

## 2019-06-30 MED ORDER — EZETIMIBE 10 MG PO TABS: 10.0000 mg | ORAL_TABLET | Freq: Every day | ORAL | 3 refills | Status: DC

## 2019-06-30 NOTE — Telephone Encounter (Signed)
Letter of results sent to pt  Lab orders mailed to the pt  New script mailed to the patient

## 2019-07-06 ENCOUNTER — Other Ambulatory Visit: Payer: Self-pay | Admitting: Cardiology

## 2019-07-16 ENCOUNTER — Telehealth: Payer: Self-pay | Admitting: Cardiology

## 2019-07-16 ENCOUNTER — Other Ambulatory Visit: Payer: Self-pay

## 2019-07-16 MED ORDER — ENTRESTO 24-26 MG PO TABS: 1.0000 | ORAL_TABLET | Freq: Two times a day (BID) | ORAL | 2 refills | Status: DC

## 2019-07-16 NOTE — Telephone Encounter (Signed)
Refill for entresto 24-26 sent to pharmacy

## 2019-07-16 NOTE — Telephone Encounter (Signed)
Pt c/o medication issue:  1. Name of Medication:   ENTRESTO 24-26 MG    2. How are you currently taking this medication (dosage and times per day)? As written  3. Are you having a reaction (difficulty breathing--STAT)? No  4. What is your medication issue? Needs a new prescription approved by Dr. Stanford Breed

## 2019-07-21 NOTE — Telephone Encounter (Signed)
Follow Up  Patient is calling in to follow up about medication. States that pharmacy received the prescription and the manufacturer states that they need to know why the patient is being prescribed this medication because it is expensive to fill. Patient would like the office to please give pharmacy a call. Patient states that she is out of the medication.

## 2019-07-21 NOTE — Telephone Encounter (Signed)
Called made to [facility] pharmacy to inquire about [facility] doesn't cover without PA PA phone #: [phone]  Sent to primary nurse

## 2019-07-22 NOTE — Telephone Encounter (Signed)
Spoke with pt, aware PA approved.

## 2019-09-16 ENCOUNTER — Other Ambulatory Visit: Payer: Self-pay | Admitting: Cardiology

## 2019-09-16 DIAGNOSIS — E785 Hyperlipidemia, unspecified: Secondary | ICD-10-CM

## 2019-09-18 ENCOUNTER — Other Ambulatory Visit: Payer: Self-pay | Admitting: *Deleted

## 2019-09-18 DIAGNOSIS — E119 Type 2 diabetes mellitus without complications: Secondary | ICD-10-CM

## 2019-09-18 MED ORDER — EMPAGLIFLOZIN 10 MG PO TABS: 10.0000 mg | ORAL_TABLET | Freq: Every day | ORAL | 1 refills | Status: DC

## 2019-09-21 NOTE — Telephone Encounter (Signed)
Pt appt on 09/24/2019 @ 1:15.

## 2019-09-23 ENCOUNTER — Other Ambulatory Visit: Payer: Self-pay | Admitting: Student

## 2019-09-23 DIAGNOSIS — E119 Type 2 diabetes mellitus without complications: Secondary | ICD-10-CM

## 2019-09-23 DIAGNOSIS — E785 Hyperlipidemia, unspecified: Secondary | ICD-10-CM

## 2019-09-23 NOTE — Progress Notes (Signed)
   CC: follow up on DM, HTN, HFrEF  HPI:  [name] is a 69 y.o. female with history as below presenting for follow up on DM, HTN, HFrEF. Please refer to problem based charting for further details of assessment and plan of current problem and chronic medical conditions.  Past Medical History:  Diagnosis Date  . Acute exacerbation of CHF (congestive heart failure) ([place]) 09/12/2017  . Acute gallstone pancreatitis   . Acute gallstone pancreatitis   . Acute on chronic systolic heart failure ([place])   . Bilateral pleural effusion 02/22/2014  . Cardiomyopathy, ischemic, EF 25-30% 03/23/2014   03/01/2014- Date of procedure- CABG- X5- Severe 3-vessel coronary artery disease with severe LV dysfunction. Ejection fraction 25-30%. SURGICAL PROCEDURE: Coronary artery bypass grafting x 5. SURGEON: Lanelle Bal, MD.    . Cardiomyopathy, ischemic, EF 25-30% 03/23/2014   03/01/2014- Date of procedure- CABG- X5- Severe 3-vessel coronary artery disease with severe LV dysfunction. Ejection fraction 25-30%. SURGICAL PROCEDURE: Coronary artery bypass grafting x 5. SURGEON: Lanelle Bal, MD.    . Chronic systolic heart failure ([place])   . Coronary artery disease involving native coronary artery of native heart without angina pectoris   . Diabetes mellitus 1998   Dx in 1998. Microalbuminuria, never on insulin.  . Diabetes mellitus type 2, controlled, without complications ([place]) 67/08/9379   Last A1C 6.7 (03/21/17)    On Diet Control Only Last Foot Exam: 03/21/17    Last eye exam: to be scheduled early 2019   . Dyspnea   . Essential hypertension 12/05/2005   Lisinopril 40mg  Daily, Coreg 25mg  BID.   Marland Kitchen GERD 12/05/2005   Qualifier: Diagnosis of  By: Prudencio Burly MD, Phillip Heal    . GERD (gastroesophageal reflux disease)   . History of blurry vision 06/09   Hospitalized for this  . Hyperlipidemia   . Hypertension   . Non-intractable vomiting 12/31/2017  . Personal history of colonic adenomas 10/07/2007  .  Pneumonia 01/2014  . 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 Viral URI with cough 03/21/2017   Review of Systems:   Review of Systems  Constitutional: Negative for chills and fever.  HENT: Negative for congestion and sore throat.   Eyes: Negative for blurred vision and double vision.  Respiratory: Negative for cough and shortness of breath.   Cardiovascular: Positive for leg swelling. Negative for chest pain, palpitations and orthopnea.  Neurological: Negative for dizziness, loss of consciousness and headaches.     Physical Exam: Vitals:   09/24/19 1316  BP: 116/77  Pulse: 82  Temp: 98.1 F (36.7 C)  TempSrc: Oral  SpO2: 100%  Weight: 183 lb 4.8 oz (83.1 kg)  Height: 5\' 2"  (1.575 m)   Constitutional: no acute distress Head: atraumatic ENT: external ears normal Cardiovascular: regular rate and rhythm, normal heart sounds, 2+ LLE edema, 1+ RLE edema, 2+ radial pulses Pulmonary: effort normal, normal breath sounds bilaterally Abdominal: flat, nontender, no rebound tenderness, bowel sounds normal Skin: warm and dry Neurological: alert, no focal deficit Psychiatric: normal mood and affect  Assessment & Plan:   See Encounters Tab for problem based charting.  Patient seen with Dr. Evette Doffing

## 2019-09-23 NOTE — Telephone Encounter (Signed)
I filled only 30 days because she may require medication changes due to borderline GFR for jardiance. I have an appointment with her tomorrow, I will check her kidney function and will write a 90 day prescription if she is appropriate to continue on Jardiance.

## 2019-09-23 NOTE — Telephone Encounter (Signed)
Atorvastatin #90 with 3 refills sent 09/16/2019. Will forward request for 90 day supply of Jardiance. Hubbard Hartshorn, BSN, RN-BC

## 2019-09-23 NOTE — Telephone Encounter (Signed)
Need refill on  atorvastatin (LIPITOR) 80 MG tablet empagliflozin (JARDIANCE) 10 MG TABS tablet   * 90 days supply   [facility] [address] [place]

## 2019-09-24 ENCOUNTER — Other Ambulatory Visit: Payer: Self-pay

## 2019-09-24 ENCOUNTER — Encounter: Payer: Self-pay | Admitting: Student

## 2019-09-24 ENCOUNTER — Ambulatory Visit (INDEPENDENT_AMBULATORY_CARE_PROVIDER_SITE_OTHER): Payer: Medicare Other | Admitting: Student

## 2019-09-24 VITALS — BP 116/77 | HR 82 | Temp 98.1°F | Ht 62.0 in | Wt 183.3 lb

## 2019-09-24 DIAGNOSIS — Z0000 Encounter for general adult medical examination without abnormal findings: Secondary | ICD-10-CM | POA: Diagnosis not present

## 2019-09-24 DIAGNOSIS — I5022 Chronic systolic (congestive) heart failure: Secondary | ICD-10-CM | POA: Diagnosis not present

## 2019-09-24 DIAGNOSIS — Z7984 Long term (current) use of oral hypoglycemic drugs: Secondary | ICD-10-CM

## 2019-09-24 DIAGNOSIS — I130 Hypertensive heart and chronic kidney disease with heart failure and stage 1 through stage 4 chronic kidney disease, or unspecified chronic kidney disease: Secondary | ICD-10-CM

## 2019-09-24 DIAGNOSIS — E785 Hyperlipidemia, unspecified: Secondary | ICD-10-CM

## 2019-09-24 DIAGNOSIS — N184 Chronic kidney disease, stage 4 (severe): Secondary | ICD-10-CM | POA: Insufficient documentation

## 2019-09-24 DIAGNOSIS — E1122 Type 2 diabetes mellitus with diabetic chronic kidney disease: Secondary | ICD-10-CM

## 2019-09-24 DIAGNOSIS — E119 Type 2 diabetes mellitus without complications: Secondary | ICD-10-CM

## 2019-09-24 DIAGNOSIS — I10 Essential (primary) hypertension: Secondary | ICD-10-CM

## 2019-09-24 DIAGNOSIS — N1832 Chronic kidney disease, stage 3b: Secondary | ICD-10-CM | POA: Diagnosis not present

## 2019-09-24 DIAGNOSIS — E7800 Pure hypercholesterolemia, unspecified: Secondary | ICD-10-CM

## 2019-09-24 LAB — POCT GLYCOSYLATED HEMOGLOBIN (HGB A1C): Hemoglobin A1C: 7.8 % — AB (ref 4.0–5.6)

## 2019-09-24 LAB — GLUCOSE, CAPILLARY: Glucose-Capillary: 189 mg/dL — ABNORMAL HIGH (ref 70–99)

## 2019-09-24 MED ORDER — EMPAGLIFLOZIN 10 MG PO TABS: 10.0000 mg | ORAL_TABLET | Freq: Every day | ORAL | 1 refills | Status: DC

## 2019-09-24 NOTE — Assessment & Plan Note (Signed)
Following with cardiology for this. On Coreg, Entresto, Lasix, and ASA. Currently doing well, denies any symptoms or orthopnea. Has 2+ edema in the L leg and 1+ on the R, which she states has been stable. No crackles on lung exam. Continue current regimen.   - Coreg 25mg  BID - Entresto 24/26mg  BID - Lasix 40mg  Daily - Jardiance 10mg  Daily - ASA 81mg  Daily - TakeLasixPRN for weight gain of 3lbsin 24 hrs - Follow up with Cardiology, next visit on 9/27

## 2019-09-24 NOTE — Assessment & Plan Note (Signed)
Hx of CAD. Last cholesterol panel in Feb 2021 with LDL 84.  -continue Lipitor 80mg  -continue ezetemibe 10mg  -repeat lipid panel in 6 months

## 2019-09-24 NOTE — Patient Instructions (Addendum)
Thank you for allowing [place] to be a part of your care today, it was a pleasure seeing you. We discussed your diabetes, heart failure, hypertension.  I am checking these labs: BMP  Your A1c today is 7.8. This is a bit higher than last time, and we would like this number to be lower. After discussing our options for addressing this with you, you expressed your desire to not start a new medication and to make diet changes. This is fine with me. We will continue your Jardiance 10mg  daily.   Please call you ophthalmologist to schedule an eye exam. I am ordering a screening mammogram, they will call you for an appointment.  Please consider getting a COVID vaccine. I know there is a lot of scary news about the complications of the vaccine, but you are much more likely to have a serious complication from a COVID infection.  Please follow up in 6 months   Thank you, and please call the Internal Medicine Clinic at [phone] if you have any questions.  Best, Dr. Bridgett Larsson

## 2019-09-24 NOTE — Assessment & Plan Note (Signed)
Most recent creatinine of 1.67 and GFR of 36 in May 2021. Prior to that had an AKI in February with creatinine as high as 2.4, which improved after reducing her diuretics for heart failure.   -continue Januvia 10mg  daily -check BMP today

## 2019-09-24 NOTE — Assessment & Plan Note (Signed)
BP of 116/77 today. Has also been well controlled on past several visits. Denies chest pain, palpitations, dizziness, headaches, LOC.  Continue current regimen  - Coreg 25mg  BID - Entresto 24/26mg  BID - Lasix 40mg  Daily

## 2019-09-24 NOTE — Assessment & Plan Note (Signed)
-  mammogram ordered -counseled on COVID vaccine, she afraid of serious side effects, discussed that she is more likely to have serious complications from COVID infection

## 2019-09-24 NOTE — Assessment & Plan Note (Addendum)
A1c of 7.8 today. Most recently 7.3 in May 2021. She is taking Januvia 74m, previously had metformin but disliked side effects. Concern that this may not be efficacious due to borderline GFR. Had AKI in February with creatinine as high as 2.4, which improved after reducing her diuretics for heart failure. She was restarted on Januvia after her creatinine improved. She reports more dietary indiscretion over the past few months, and desires to try diet changes rather than start a new medication today Denies neuropathy or foot wounds. Overdue to diabetic eye exam, she states that it is a hassle for her to get to appointments because [place]. Encouraged her to get an eye exam soon and she arees.  -continue Januvia 178mdaily -check BMP today for kidney function -patient will call to make an eye exam appointment  -follow up in 6 months, consider a GLP-1 agonist at that time for co-morbid CAD  Addendum: creatinine 1.83 and eGFR 32. Continue Januvia for now, it is less efficacious below GFR 30 though still has renal and cardiac benefits. At next visit, check creatinine and consider changing or adding a medication

## 2019-09-25 ENCOUNTER — Other Ambulatory Visit: Payer: Self-pay | Admitting: Student

## 2019-09-25 LAB — BMP8+ANION GAP
Anion Gap: 13 mmol/L (ref 10.0–18.0)
BUN/Creatinine Ratio: 16 (ref 12–28)
BUN: 29 mg/dL — ABNORMAL HIGH (ref 8–27)
CO2: 24 mmol/L (ref 20–29)
Calcium: 9.1 mg/dL (ref 8.7–10.3)
Chloride: 103 mmol/L (ref 96–106)
Creatinine, Ser: 1.83 mg/dL — ABNORMAL HIGH (ref 0.57–1.00)
GFR calc Af Amer: 32 mL/min/{1.73_m2} — ABNORMAL LOW (ref >59)
GFR calc non Af Amer: 28 mL/min/{1.73_m2} — ABNORMAL LOW (ref >59)
Glucose: 190 mg/dL — ABNORMAL HIGH (ref 65–99)
Potassium: 5.2 mmol/L (ref 3.5–5.2)
Sodium: 140 mmol/L (ref 134–144)

## 2019-09-27 NOTE — Progress Notes (Signed)
Internal Medicine Clinic Attending  I saw and evaluated the patient.  I personally confirmed the key portions of the history and exam documented by Dr. Chen and I reviewed pertinent patient test results.  The assessment, diagnosis, and plan were formulated together and I agree with the documentation in the resident's note.  

## 2019-10-12 NOTE — Progress Notes (Deleted)
HPI: FU CAD; previously admitted with CHF; echo 1/16 showed EF 25-30, mild to moderate MR, mild LAE/RAE/RVE; moderately reduced RV function; small pericardial effusion. Carotid dopplers 1/16 showed 1-39 bilateral stenosis. Cath revealed 3 vessel CAD and EF 30-35. Had CABG with LIMA to LAD, seq svg to first and second diagonal, svg to OM, svg to PDA. Patient previously referred to electrophysiology for consideration of ICD but patient declined.Echocardiogram repeated March 2021 and showed ejection fraction 30 to 50%, grade 2 diastolic dysfunction, moderate left atrial enlargement, moderate mitral regurgitation.  Since last seen,   Current Outpatient Medications  Medication Sig Dispense Refill  . aspirin 81 MG tablet Take 1 tablet (81 mg total) by mouth daily.    Marland Kitchen atorvastatin (LIPITOR) 80 MG tablet TAKE ONE TABLET BY MOUTH DAILY 90 tablet 3  . calcium carbonate (OS-CAL - DOSED IN MG OF ELEMENTAL CALCIUM) 1250 (500 Ca) MG tablet Take 1 tablet by mouth daily.    . carvedilol (COREG) 25 MG tablet TAKE ONE TABLET BY MOUTH TWICE A DAY WITH A MEAL 180 tablet 3  . cholecalciferol (VITAMIN D3) 25 MCG (1000 UT) tablet Take 1 tablet by mouth daily.    . empagliflozin (JARDIANCE) 10 MG TABS tablet Take 1 tablet (10 mg total) by mouth daily. 90 tablet 1  . ezetimibe (ZETIA) 10 MG tablet Take 1 tablet (10 mg total) by mouth daily. 90 tablet 3  . furosemide (LASIX) 80 MG tablet Take 0.5 tablets (40 mg total) by mouth daily. 90 tablet 3  . sacubitril-valsartan (ENTRESTO) 24-26 MG Take 1 tablet by mouth 2 (two) times daily. 180 tablet 2   No current facility-administered medications for this visit.     Past Medical History:  Diagnosis Date  . Acute exacerbation of CHF (congestive heart failure) ([place]) 09/12/2017  . Acute gallstone pancreatitis   . Acute gallstone pancreatitis   . Acute on chronic systolic heart failure ([place])   . Bilateral pleural effusion 02/22/2014  . Cardiomyopathy, ischemic, EF  25-30% 03/23/2014   03/01/2014- Date of procedure- CABG- X5- Severe 3-vessel coronary artery disease with severe LV dysfunction. Ejection fraction 25-30%. SURGICAL PROCEDURE: Coronary artery bypass grafting x 5. SURGEON: Lanelle Bal, MD.    . Cardiomyopathy, ischemic, EF 25-30% 03/23/2014   03/01/2014- Date of procedure- CABG- X5- Severe 3-vessel coronary artery disease with severe LV dysfunction. Ejection fraction 25-30%. SURGICAL PROCEDURE: Coronary artery bypass grafting x 5. SURGEON: Lanelle Bal, MD.    . Chronic systolic heart failure ([place])   . Coronary artery disease involving native coronary artery of native heart without angina pectoris   . Diabetes mellitus 1998   Dx in 1998. Microalbuminuria, never on insulin.  . Diabetes mellitus type 2, controlled, without complications ([place]) 53/09/7671   Last A1C 6.7 (03/21/17)    On Diet Control Only Last Foot Exam: 03/21/17    Last eye exam: to be scheduled early 2019   . Dyspnea   . Essential hypertension 12/05/2005   Lisinopril 40mg  Daily, Coreg 25mg  BID.   Marland Kitchen GERD 12/05/2005   Qualifier: Diagnosis of  By: Prudencio Burly MD, Phillip Heal    . GERD (gastroesophageal reflux disease)   . History of blurry vision 06/09   Hospitalized for this  . Hyperlipidemia   . Hypertension   . Non-intractable vomiting 12/31/2017  . Personal history of colonic adenomas 10/07/2007  . Pneumonia 01/2014  . 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 Viral URI with cough 03/21/2017    Past Surgical History:  Procedure Laterality Date  . CHOLECYSTECTOMY N/A 08/06/2016   Procedure: LAPAROSCOPIC CHOLECYSTECTOMY WITH INTRAOPERATIVE CHOLANGIOGRAM;  Surgeon: Georganna Skeans, MD;  Location: [facility];  Service: General;  Laterality: N/A;  . CORONARY ARTERY BYPASS GRAFT N/A 03/01/2014   Procedure: CORONARY ARTERY BYPASS GRAFTING (CABG) x five, using left internal mammary artery and right leg greater saphenous vein harvested endoscopically;  Surgeon: Grace Isaac, MD;  Location: [facility];  Service:  Open Heart Surgery;  Laterality: N/A;  . INTRAOPERATIVE TRANSESOPHAGEAL ECHOCARDIOGRAM N/A 03/01/2014   Procedure: INTRAOPERATIVE TRANSESOPHAGEAL ECHOCARDIOGRAM;  Surgeon: Grace Isaac, MD;  Location: [facility];  Service: Open Heart Surgery;  Laterality: N/A;  . LEFT AND RIGHT HEART CATHETERIZATION WITH CORONARY ANGIOGRAM N/A 02/26/2014   Procedure: LEFT AND RIGHT HEART CATHETERIZATION WITH CORONARY ANGIOGRAM;  Surgeon: Sinclair Grooms, MD;  Location: [facility] CATH LAB;  Service: Cardiovascular;  Laterality: N/A;  . TOTAL ABDOMINAL HYSTERECTOMY W/ BILATERAL SALPINGOOPHORECTOMY  1996    Social History   Socioeconomic History  . Marital status: Widowed    Spouse name: Not on file  . Number of children: Not on file  . Years of education: Not on file  . Highest education level: Not on file  Occupational History  . Occupation: Automotive engineer  Tobacco Use  . Smoking status: Never Smoker  . Smokeless tobacco: Never Used  Vaping Use  . Vaping Use: Never used  Substance and Sexual Activity  . Alcohol use: No    Alcohol/week: 0.0 standard drinks  . Drug use: No  . Sexual activity: Not on file  Other Topics Concern  . Not on file  Social History Narrative   Married x 42 yrs.  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   Never smoked. Doesn't drink or use drugs.   Does Patient Exercise:  yes - sometimes      To get diabetes supplies for [name] RD, CDE, April 20,2011   Social Determinants of Health   Financial Resource Strain:   . Difficulty of Paying Living Expenses: Not on file  Food Insecurity:   . Worried About Charity fundraiser in the Last Year: Not on file  . Ran Out of Food in the Last Year: Not on file  Transportation Needs:   . Lack of Transportation (Medical): Not on file  . Lack of Transportation (Non-Medical): Not on file  Physical Activity:   . Days of Exercise per Week: Not on file  . Minutes of  Exercise per Session: Not on file  Stress:   . Feeling of Stress : Not on file  Social Connections:   . Frequency of Communication with Friends and Family: Not on file  . Frequency of Social Gatherings with Friends and Family: Not on file  . Attends Religious Services: Not on file  . Active Member of Clubs or Organizations: Not on file  . Attends Archivist Meetings: Not on file  . Marital Status: Not on file  Intimate Partner Violence:   . Fear of Current or Ex-Partner: Not on file  . Emotionally Abused: Not on file  . Physically Abused: Not on file  . Sexually Abused: Not on file    Family History  Problem Relation Age of Onset  . Diabetes Mother        Mother died of MI at age 56  . Heart failure Mother   . Kidney failure Mother   . Heart failure Sister  Died  . Heart failure Brother        Died  . Diabetes Brother        Died    ROS: no fevers or chills, productive cough, hemoptysis, dysphasia, odynophagia, melena, hematochezia, dysuria, hematuria, rash, seizure activity, orthopnea, PND, pedal edema, claudication. Remaining systems are negative.  Physical Exam: Well-developed well-nourished in no acute distress.  Skin is warm and dry.  HEENT is normal.  Neck is supple.  Chest is clear to auscultation with normal expansion.  Cardiovascular exam is regular rate and rhythm.  Abdominal exam nontender or distended. No masses palpated. Extremities show no edema. neuro grossly intact  ECG- personally reviewed  A/P  1 chronic systolic congestive heart failure-patient has improved following reinitiation of her diuretics.  We will continue with Lasix and spironolactone at present dose.  2 ischemic cardiomyopathy-continue Entresto and beta-blocker.  Patient declines ICD and understands the higher risk of sudden death.  3 coronary artery disease-she denies chest pain.  Continue aspirin and statin.  4 hypertension-patient's blood pressure is controlled.   Continue present medications and follow.  5 hyperlipidemia-continue statin.  6 moderate mitral regurgitation-patient will need follow-up echoes in the future.  Kirk Ruths, MD

## 2019-10-19 ENCOUNTER — Ambulatory Visit
Admission: RE | Admit: 2019-10-19 | Discharge: 2019-10-19 | Disposition: A | Discharge status: Home / Self Care | Payer: Medicare Other | Source: Ambulatory Visit | Attending: Student in an Organized Health Care Education/Training Program | Admitting: Student in an Organized Health Care Education/Training Program

## 2019-10-19 ENCOUNTER — Other Ambulatory Visit: Payer: Self-pay

## 2019-10-19 DIAGNOSIS — Z0000 Encounter for general adult medical examination without abnormal findings: Secondary | ICD-10-CM

## 2019-10-24 ENCOUNTER — Emergency Department (HOSPITAL_COMMUNITY)
Admission: EM | Admit: 2019-10-24 | Discharge: 2019-10-25 | Disposition: A | Discharge status: Home / Self Care | Payer: Medicare Other | Attending: Emergency Medicine | Admitting: Emergency Medicine

## 2019-10-24 ENCOUNTER — Emergency Department (HOSPITAL_COMMUNITY): Payer: Medicare Other

## 2019-10-24 ENCOUNTER — Telehealth: Payer: Self-pay | Admitting: Nurse Practitioner

## 2019-10-24 ENCOUNTER — Other Ambulatory Visit: Payer: Self-pay

## 2019-10-24 ENCOUNTER — Encounter (HOSPITAL_COMMUNITY): Payer: Self-pay

## 2019-10-24 DIAGNOSIS — U071 COVID-19: Secondary | ICD-10-CM | POA: Diagnosis not present

## 2019-10-24 DIAGNOSIS — I2510 Atherosclerotic heart disease of native coronary artery without angina pectoris: Secondary | ICD-10-CM | POA: Diagnosis not present

## 2019-10-24 DIAGNOSIS — I5023 Acute on chronic systolic (congestive) heart failure: Secondary | ICD-10-CM | POA: Insufficient documentation

## 2019-10-24 DIAGNOSIS — N183 Chronic kidney disease, stage 3 unspecified: Secondary | ICD-10-CM | POA: Insufficient documentation

## 2019-10-24 DIAGNOSIS — Z951 Presence of aortocoronary bypass graft: Secondary | ICD-10-CM | POA: Diagnosis not present

## 2019-10-24 DIAGNOSIS — E1122 Type 2 diabetes mellitus with diabetic chronic kidney disease: Secondary | ICD-10-CM | POA: Insufficient documentation

## 2019-10-24 DIAGNOSIS — Z79899 Other long term (current) drug therapy: Secondary | ICD-10-CM | POA: Diagnosis not present

## 2019-10-24 DIAGNOSIS — Z23 Encounter for immunization: Secondary | ICD-10-CM | POA: Diagnosis not present

## 2019-10-24 DIAGNOSIS — I130 Hypertensive heart and chronic kidney disease with heart failure and stage 1 through stage 4 chronic kidney disease, or unspecified chronic kidney disease: Secondary | ICD-10-CM | POA: Diagnosis not present

## 2019-10-24 DIAGNOSIS — Z7982 Long term (current) use of aspirin: Secondary | ICD-10-CM | POA: Insufficient documentation

## 2019-10-24 DIAGNOSIS — R509 Fever, unspecified: Secondary | ICD-10-CM | POA: Diagnosis present

## 2019-10-24 LAB — RESPIRATORY PANEL BY RT PCR (FLU A&B, COVID)
Influenza A by PCR: NEGATIVE
Influenza B by PCR: NEGATIVE
SARS Coronavirus 2 by RT PCR: POSITIVE — AB

## 2019-10-24 LAB — CBG MONITORING, ED: Glucose-Capillary: 73 mg/dL (ref 70–99)

## 2019-10-24 MED ORDER — ACETAMINOPHEN 500 MG PO TABS
1000.0000 mg | ORAL_TABLET | Freq: Once | ORAL | Status: AC
  Administered 2019-10-24: 1000 mg via ORAL
  Filled 2019-10-24: qty 2

## 2019-10-24 MED ORDER — ALBUTEROL SULFATE HFA 108 (90 BASE) MCG/ACT IN AERS: 2.0000 | INHALATION_SPRAY | Freq: Once | RESPIRATORY_TRACT | Status: DC | PRN

## 2019-10-24 MED ORDER — METHYLPREDNISOLONE SODIUM SUCC 125 MG IJ SOLR: 125.0000 mg | Freq: Once | INTRAMUSCULAR | Status: DC | PRN

## 2019-10-24 MED ORDER — DIPHENHYDRAMINE HCL 50 MG/ML IJ SOLN: 50.0000 mg | Freq: Once | INTRAMUSCULAR | Status: DC | PRN

## 2019-10-24 MED ORDER — FAMOTIDINE IN NACL 20-0.9 MG/50ML-% IV SOLN: 20.0000 mg | Freq: Once | INTRAVENOUS | Status: DC | PRN

## 2019-10-24 MED ORDER — SODIUM CHLORIDE 0.9 % IV SOLN: INTRAVENOUS | Status: DC | PRN

## 2019-10-24 MED ORDER — SODIUM CHLORIDE 0.9 % IV SOLN: 1200.0000 mg | Freq: Once | INTRAVENOUS | Status: DC

## 2019-10-24 MED ORDER — EPINEPHRINE 0.3 MG/0.3ML IJ SOAJ: 0.3000 mg | Freq: Once | INTRAMUSCULAR | Status: DC | PRN

## 2019-10-24 MED ORDER — SODIUM CHLORIDE 0.9 % IV SOLN
Freq: Once | INTRAVENOUS | Status: AC
  Filled 2019-10-24: qty 20

## 2019-10-24 NOTE — Discharge Instructions (Signed)
Your work-up today showed that you are positive for Covid.  You have received the monoclonal antibody infusion today.  I provided you a referral to the Covid care clinic that you can follow-up with.  They will see patient  It is important to carefully monitor your symptoms.  If you have a pulse oximeter that we will leave your oxygen at home, you can measure it and monitor it closely.  If it starts going below 90, you need to return emergency department.  Return emergency department for any worsening difficulty breathing, fevers, chest pain, persistent vomiting or any other worsening concerning symptoms.

## 2019-10-24 NOTE — ED Notes (Signed)
Lab called with results.  Positive COVID.  Negative Flu & RSV.

## 2019-10-24 NOTE — ED Notes (Addendum)
Attempted to call daughter in law (Ginger) while in patient's room to provide update.  No answer.  Will try again later.   Ginger- [phone]

## 2019-10-24 NOTE — ED Notes (Signed)
Pt ambulated in room without difficulty, SP02 remained in the mid to upper 90' while on RA.

## 2019-10-24 NOTE — ED Provider Notes (Signed)
Care assumed from [facility], please see her note for full details, but in brief [name] is a 69 y.o. female who tested positive for Covid Covid today after 6 days of subjective fevers, chills, body aches, fatigue and diarrhea.  Reports productive cough.  Denies shortness of breath or chest pain.  Antibody infusion clinic consulted and they recommend going ahead and getting infusion while patient is in the ED today.  This has been ordered for the patient and she has a chest x-ray pending as well.  Plan is for discharge after antibody infusion.  BP 128/79 (BP Location: Left Arm)   Pulse 81   Temp 99.9 F (37.7 C) (Oral)   Resp 16   Wt 80.7 kg   SpO2 99%   BMI 32.56 kg/m    ED Course/Procedures   Labs Reviewed  RESPIRATORY PANEL BY RT PCR (FLU A&B, COVID) - Abnormal; Notable for the following components:      Result Value   SARS Coronavirus 2 by RT PCR POSITIVE (*)    All other components within normal limits  CBG MONITORING, ED   DG Chest Port 1 View  Result Date: 10/24/2019 CLINICAL DATA:  COVID positive. Coughing. EXAM: PORTABLE CHEST 1 VIEW COMPARISON:  [facility] 2019 FINDINGS: Postsurgical changes of CABG. Enlarged cardiac silhouette.  Mediastinal contours appear intact. Minimal reticular airspace opacities in bilateral lower lobes. Osseous structures are without acute abnormality. Soft tissues are grossly normal. IMPRESSION: 1. Minimal reticular airspace opacities in bilateral lower lobes may represent mild atypical/viral pneumonia. 2. Enlarged cardiac silhouette. Electronically Signed   By: Fidela Salisbury M.D.   On: 10/24/2019 20:50     Procedures  MDM   Patient is getting antibody infusion and assuming that she receives this without any complication she will be stable for discharge afterwards.       Jacqlyn Larsen, PA-C 10/24/19 2302    Wyvonnia Dusky, MD 10/25/19 [phone]

## 2019-10-24 NOTE — ED Notes (Addendum)
Spoke with pharmacy about bamlanivimab 700 mg, etesevimab 1,400 mg in sodium chloride 0.9 % 160 mL  Will have ED pharmacist deliver

## 2019-10-24 NOTE — ED Provider Notes (Signed)
[facility] EMERGENCY DEPARTMENT Provider Note   CSN: [id number] Arrival date & time: 10/24/19  1532     History No chief complaint on file.   [name] is a 69 y.o. female past medical history of CHF, pancreatitis, diabetes, hypertension who presents for evaluation of 6 days of subjective fever/chills, generalized body aches, fatigue, diarrhea.  She states she has not measured a fever but has felt hot.  She states that she has been coughing and states that is productive of phlegm.  No hemoptysis.  She does not feel like she is having any trouble breathing or chest pain.  She states she has had 1-2 episodes of watery diarrhea a day.  No blood noted in stools.  She has not had any nausea/vomiting or abdominal pain.  She does not smoke and denies any history of asthma or COPD.  She reports that her grandson and daughter had similar symptoms.  She lives at home with them.  She has not gotten vaccinated for Covid.  The history is provided by the patient.       Past Medical History:  Diagnosis Date  . Acute exacerbation of CHF (congestive heart failure) ([place]) 09/12/2017  . Acute gallstone pancreatitis   . Acute gallstone pancreatitis   . Acute on chronic systolic heart failure ([place])   . Bilateral pleural effusion 02/22/2014  . Cardiomyopathy, ischemic, EF 25-30% 03/23/2014   03/01/2014- Date of procedure- CABG- X5- Severe 3-vessel coronary artery disease with severe LV dysfunction. Ejection fraction 25-30%. SURGICAL PROCEDURE: Coronary artery bypass grafting x 5. SURGEON: Lanelle Bal, MD.    . Cardiomyopathy, ischemic, EF 25-30% 03/23/2014   03/01/2014- Date of procedure- CABG- X5- Severe 3-vessel coronary artery disease with severe LV dysfunction. Ejection fraction 25-30%. SURGICAL PROCEDURE: Coronary artery bypass grafting x 5. SURGEON: Lanelle Bal, MD.    . Chronic systolic heart failure ([place])   . Coronary artery disease involving native coronary  artery of native heart without angina pectoris   . Diabetes mellitus 1998   Dx in 1998. Microalbuminuria, never on insulin.  . Diabetes mellitus type 2, controlled, without complications ([place]) 53/06/1441   Last A1C 6.7 (03/21/17)    On Diet Control Only Last Foot Exam: 03/21/17    Last eye exam: to be scheduled early 2019   . Dyspnea   . Essential hypertension 12/05/2005   Lisinopril 40mg  Daily, Coreg 25mg  BID.   Marland Kitchen GERD 12/05/2005   Qualifier: Diagnosis of  By: Prudencio Burly MD, Phillip Heal    . GERD (gastroesophageal reflux disease)   . History of blurry vision 06/09   Hospitalized for this  . Hyperlipidemia   . Hypertension   . Non-intractable vomiting 12/31/2017  . Personal history of colonic adenomas 10/07/2007  . Pneumonia 01/2014  . 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 Viral URI with cough 03/21/2017    Patient Active Problem List   Diagnosis Date Noted  . Chronic kidney disease, stage 3 09/24/2019  . Osteopenia of neck of left femur 12/31/2017  . Left bundle branch block   . Need for immunization against influenza 11/22/2016  . Chronic systolic heart failure ([place])   . Anemia 08/24/2014  . Dyslipidemia 04/29/2014  . Preventative health care 04/29/2014  . 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 Coronary artery disease involving native coronary artery of native heart without angina pectoris   . Personal history of colonic adenomas 10/07/2007  . Diabetes mellitus type 2, controlled, without complications ([place]) 15/40/0867  . Hyperlipidemia 12/05/2005  .  Essential hypertension 12/05/2005    Past Surgical History:  Procedure Laterality Date  . CHOLECYSTECTOMY N/A 08/06/2016   Procedure: LAPAROSCOPIC CHOLECYSTECTOMY WITH INTRAOPERATIVE CHOLANGIOGRAM;  Surgeon: Georganna Skeans, MD;  Location: [facility];  Service: General;  Laterality: N/A;  . CORONARY ARTERY BYPASS GRAFT N/A 03/01/2014   Procedure: CORONARY ARTERY BYPASS GRAFTING (CABG) x five, using left internal mammary artery and right leg greater saphenous vein harvested  endoscopically;  Surgeon: Grace Isaac, MD;  Location: [facility];  Service: Open Heart Surgery;  Laterality: N/A;  . INTRAOPERATIVE TRANSESOPHAGEAL ECHOCARDIOGRAM N/A 03/01/2014   Procedure: INTRAOPERATIVE TRANSESOPHAGEAL ECHOCARDIOGRAM;  Surgeon: Grace Isaac, MD;  Location: [facility];  Service: Open Heart Surgery;  Laterality: N/A;  . LEFT AND RIGHT HEART CATHETERIZATION WITH CORONARY ANGIOGRAM N/A 02/26/2014   Procedure: LEFT AND RIGHT HEART CATHETERIZATION WITH CORONARY ANGIOGRAM;  Surgeon: Sinclair Grooms, MD;  Location: [facility] CATH LAB;  Service: Cardiovascular;  Laterality: N/A;  . TOTAL ABDOMINAL HYSTERECTOMY W/ BILATERAL SALPINGOOPHORECTOMY  1996     OB History   No obstetric history on file.     Family History  Problem Relation Age of Onset  . Diabetes Mother        Mother died of MI at age 37  . Heart failure Mother   . Kidney failure Mother   . Heart failure Sister        Died  . Heart failure Brother        Died  . Diabetes Brother        Died    Social History   Tobacco Use  . Smoking status: Never Smoker  . Smokeless tobacco: Never Used  Vaping Use  . Vaping Use: Never used  Substance Use Topics  . Alcohol use: No    Alcohol/week: 0.0 standard drinks  . Drug use: No    Home Medications Prior to Admission medications   Medication Sig Start Date End Date Taking? Authorizing Provider  aspirin 81 MG tablet Take 1 tablet (81 mg total) by mouth daily. 10/17/15   Lelon Perla, MD  atorvastatin (LIPITOR) 80 MG tablet TAKE ONE TABLET BY MOUTH DAILY 09/16/19   Lelon Perla, MD  calcium carbonate (OS-CAL - DOSED IN MG OF ELEMENTAL CALCIUM) 1250 (500 Ca) MG tablet Take 1 tablet by mouth daily.    [provider]  carvedilol (COREG) 25 MG tablet TAKE ONE TABLET BY MOUTH TWICE A DAY WITH A MEAL 09/16/19   Lelon Perla, MD  cholecalciferol (VITAMIN D3) 25 MCG (1000 UT) tablet Take 1 tablet by mouth daily.    [provider]  empagliflozin  (JARDIANCE) 10 MG TABS tablet Take 1 tablet (10 mg total) by mouth daily. 09/24/19   Andrew Au, MD  ezetimibe (ZETIA) 10 MG tablet Take 1 tablet (10 mg total) by mouth daily. 06/30/19 09/28/19  Lelon Perla, MD  furosemide (LASIX) 80 MG tablet Take 0.5 tablets (40 mg total) by mouth daily. 03/31/19   Lelon Perla, MD  sacubitril-valsartan (ENTRESTO) 24-26 MG Take 1 tablet by mouth 2 (two) times daily. 07/16/19   Lelon Perla, MD    Allergies    Patient has no known allergies.  Review of Systems   Review of Systems  Constitutional: Positive for chills, fatigue and fever. Negative for appetite change.  Respiratory: Positive for cough. Negative for shortness of breath.   Cardiovascular: Negative for chest pain.  Gastrointestinal: Negative for abdominal pain, nausea and vomiting.  Genitourinary:  Negative for dysuria and hematuria.  Neurological: Negative for weakness and headaches.  All other systems reviewed and are negative.   Physical Exam Updated Vital Signs BP 128/79 (BP Location: Left Arm)   Pulse 81   Temp 99.9 F (37.7 C) (Oral)   Resp 16   Wt 80.7 kg   SpO2 99%   BMI 32.56 kg/m   Physical Exam Vitals and nursing note reviewed.  Constitutional:      Appearance: Normal appearance. She is well-developed.  HENT:     Head: Normocephalic and atraumatic.  Eyes:     General: Lids are normal.     Conjunctiva/sclera: Conjunctivae normal.     Pupils: Pupils are equal, round, and reactive to light.  Cardiovascular:     Rate and Rhythm: Normal rate and regular rhythm.     Pulses: Normal pulses.     Heart sounds: Normal heart sounds. No murmur heard.  No friction rub. No gallop.   Pulmonary:     Effort: Pulmonary effort is normal.     Breath sounds: Normal breath sounds.     Comments: Lungs clear to auscultation bilaterally.  Symmetric chest rise.  No wheezing, rales, rhonchi. Abdominal:     Palpations: Abdomen is soft. Abdomen is not rigid.     Tenderness:  There is no abdominal tenderness. There is no guarding.     Comments: Abdomen is soft, non-distended, non-tender. No rigidity, No guarding. No peritoneal signs.  Musculoskeletal:        General: Normal range of motion.     Cervical back: Full passive range of motion without pain.  Skin:    General: Skin is warm and dry.     Capillary Refill: Capillary refill takes less than 2 seconds.  Neurological:     Mental Status: She is alert and oriented to person, place, and time.  Psychiatric:        Speech: Speech normal.     ED Results / Procedures / Treatments   Labs (all labs ordered are listed, but only abnormal results are displayed) Labs Reviewed  RESPIRATORY PANEL BY RT PCR (FLU A&B, COVID) - Abnormal; Notable for the following components:      Result Value   SARS Coronavirus 2 by RT PCR POSITIVE (*)    All other components within normal limits    EKG EKG Interpretation  Date/Time:  Saturday October 24 2019 15:49:39 EDT Ventricular Rate:  82 PR Interval:  170 QRS Duration: 158 QT Interval:  448 QTC Calculation: 523 R Axis:   133 Text Interpretation: Normal sinus rhythm Right axis deviation Non-specific intra-ventricular conduction block Minimal voltage criteria for LVH, may be normal variant ( Cornell product ) Abnormal ECG No sig change from Aug 2019 ECG, no STMEI Confirmed by Octaviano Glow [phone]) on 10/24/2019 7:30:23 PM   Radiology No results found.  Procedures Procedures (including critical care time)  Medications Ordered in ED Medications  casirivimab-imdevimab (REGEN-COV) 1,200 mg in sodium chloride 0.9 % 110 mL IVPB (has no administration in time range)  0.9 %  sodium chloride infusion (has no administration in time range)  diphenhydrAMINE (BENADRYL) injection 50 mg (has no administration in time range)  famotidine (PEPCID) IVPB 20 mg premix (has no administration in time range)  methylPREDNISolone sodium succinate (SOLU-MEDROL) 125 mg/2 mL injection 125 mg  (has no administration in time range)  albuterol (VENTOLIN HFA) 108 (90 Base) MCG/ACT inhaler 2 puff (has no administration in time range)  EPINEPHrine (EPI-PEN) injection 0.3 mg (has no  administration in time range)    ED Course  I have reviewed the triage vital signs and the nursing notes.  Pertinent labs & imaging results that were available during my care of the patient were reviewed by me and considered in my medical decision making (see chart for details).    MDM Rules/Calculators/A&P                          69 year old female with past history of hypertension, diabetes, CHF who presents for evaluation of 6 days of subjective fever/chills, cough, fatigue, generalized weakness.  Reports that daughter and grandson have been at home with similar symptoms.  She is not vaccinated for Covid.  Initially arrival, she is afebrile, nontoxic-appearing.  Vital signs are stable.  No evidence of respiratory distress.  Covid ordered at triage.  Covid is positive.  Will obtain chest x-ray, check oxygen.  Discussed with [facility].  Given patient's age, history of hypertension, diabetes, patient is eligible for monoclonal antibody patient.  Given that she is almost 89 of her symptoms, they recommend going ahead and diffusing her intermittently.  I discussed with patient regarding getting infusion here in the ED.  She is agreeable.  We will plan for infusion, monitoring.  We will plan for chest x-ray, ambulate with pulse ox.  Patient signed out to Benedetto Goad, PA-C pending infusion.   [name] was evaluated in Emergency Department on 10/24/2019 for the symptoms described in the history of present illness. She was evaluated in the context of the global COVID-19 pandemic, which necessitated consideration that the patient might be at risk for infection with the SARS-CoV-2 virus that causes COVID-19. Institutional protocols and algorithms that pertain to the evaluation of patients at risk  for COVID-19 are in a state of rapid change based on information released by regulatory bodies including the CDC and federal and state organizations. These policies and algorithms were followed during the patient's care in the ED.  Portions of this note were generated with [employer]. Dictation errors may occur despite best attempts at proofreading.  Final Clinical Impression(s) / ED Diagnoses Final diagnoses:  [id number]    Rx / DC Orders ED Discharge Orders    None       Desma Mcgregor 10/24/19 2000    Wyvonnia Dusky, MD 10/25/19 [phone]

## 2019-10-24 NOTE — Telephone Encounter (Signed)
Called to discuss with patient about Covid symptoms and the use of casirivimab/imdevimab, a monoclonal antibody infusion for those with mild to moderate Covid symptoms and at a high risk of hospitalization.  Pt is qualified for this infusion at the [facility] center due to; Specific high risk criteria : Older age (>/= 69 yo), Diabetes and Cardiovascular disease or hypertension   Patient's daughter answered and patient is still on the way home from the Emergency Department. Message left with patient daughter that I will call back in 20-30 minutes.  Walden Field, NP [facility]

## 2019-10-24 NOTE — ED Triage Notes (Signed)
Patient wants to be tested for covid. Complains of chills and fever x 5 days. Patient has not been vaccinated, NAD

## 2019-10-25 NOTE — ED Notes (Addendum)
Patient verbalizes understanding of discharge instructions. Opportunity for questioning and answers were provided. Armband removed by staff, pt discharged from ED.   Spoke with family (Ginger) provided update as well.

## 2019-10-26 ENCOUNTER — Ambulatory Visit: Payer: Medicare Other | Admitting: Cardiology

## 2019-11-18 ENCOUNTER — Ambulatory Visit (INDEPENDENT_AMBULATORY_CARE_PROVIDER_SITE_OTHER): Payer: Medicare Other | Admitting: Nurse Practitioner

## 2019-11-18 VITALS — BP 128/72 | HR 76 | Temp 97.3°F | Wt 182.0 lb

## 2019-11-18 DIAGNOSIS — Z8616 Personal history of COVID-19: Secondary | ICD-10-CM

## 2019-11-18 NOTE — Progress Notes (Signed)
@Patient  ID: [name], female    DOB: [birth year], 69 y.o.   MRN: [id number]  Chief Complaint  Patient presents with  . New Patient (Initial Visit)    COVID 9/25 Coughing up mucus, not vaccinated revieved infusion    Referring provider: Andrew Au, MD  69 year old female with history of hypertension, CHF, cardiomyopathy, CAD, GERD, anemia.  HPI  Patient presents today for post Covid care clinic visit.  Patient was seen in the ED on 10/24/2019 and was diagnosed with Covid.  She did receive the monoclonal antibody infusion while in the ED.  X-ray showed a bilateral pneumonia.  Patient states that she has been improving slowly since hospital discharge.  She does still cough up mucus at times. Denies f/c/s, n/v/d, hemoptysis, PND, chest pain or edema.      No Known Allergies  Immunization History  Administered Date(s) Administered  . Influenza,inj,Quad PF,6+ Mos 12/12/2012, 02/23/2014, 10/12/2014, 11/22/2016, 09/26/2017, 11/24/2018  . Pneumococcal Conjugate-13 12/31/2017  . Pneumococcal Polysaccharide-23 08/17/2010, 08/08/2016  . Tdap 08/17/2010    Past Medical History:  Diagnosis Date  . Acute exacerbation of CHF (congestive heart failure) ([place]) 09/12/2017  . Acute gallstone pancreatitis   . Acute gallstone pancreatitis   . Acute on chronic systolic heart failure ([place])   . Bilateral pleural effusion 02/22/2014  . Cardiomyopathy, ischemic, EF 25-30% 03/23/2014   03/01/2014- Date of procedure- CABG- X5- Severe 3-vessel coronary artery disease with severe LV dysfunction. Ejection fraction 25-30%. SURGICAL PROCEDURE: Coronary artery bypass grafting x 5. SURGEON: Lanelle Bal, MD.    . Cardiomyopathy, ischemic, EF 25-30% 03/23/2014   03/01/2014- Date of procedure- CABG- X5- Severe 3-vessel coronary artery disease with severe LV dysfunction. Ejection fraction 25-30%. SURGICAL PROCEDURE: Coronary artery bypass grafting x 5. SURGEON: Lanelle Bal, MD.    . Chronic  systolic heart failure ([place])   . Coronary artery disease involving native coronary artery of native heart without angina pectoris   . Diabetes mellitus 1998   Dx in 1998. Microalbuminuria, never on insulin.  . Diabetes mellitus type 2, controlled, without complications ([place]) 03/05/971   Last A1C 6.7 (03/21/17)    On Diet Control Only Last Foot Exam: 03/21/17    Last eye exam: to be scheduled early 2019   . Dyspnea   . Essential hypertension 12/05/2005   Lisinopril 40mg  Daily, Coreg 25mg  BID.   Marland Kitchen GERD 12/05/2005   Qualifier: Diagnosis of  By: Prudencio Burly MD, Phillip Heal    . GERD (gastroesophageal reflux disease)   . History of blurry vision 06/09   Hospitalized for this  . Hyperlipidemia   . Hypertension   . Non-intractable vomiting 12/31/2017  . Personal history of colonic adenomas 10/07/2007  . Pneumonia 01/2014  . 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 Viral URI with cough 03/21/2017    Tobacco History: Social History   Tobacco Use  Smoking Status Never Smoker  Smokeless Tobacco Never Used   Counseling given: Not Answered   Outpatient Encounter Medications as of 11/18/2019  Medication Sig  . aspirin 81 MG tablet Take 1 tablet (81 mg total) by mouth daily.  Marland Kitchen atorvastatin (LIPITOR) 80 MG tablet TAKE ONE TABLET BY MOUTH DAILY (Patient taking differently: Take by mouth daily. )  . calcium carbonate (OS-CAL - DOSED IN MG OF ELEMENTAL CALCIUM) 1250 (500 Ca) MG tablet Take 1 tablet by mouth daily.  . carvedilol (COREG) 25 MG tablet TAKE ONE TABLET BY MOUTH TWICE A DAY WITH A MEAL (Patient taking differently: Take 25  mg by mouth 2 (two) times daily with a meal. )  . cholecalciferol (VITAMIN D3) 25 MCG (1000 UT) tablet Take 1 tablet by mouth daily.  . empagliflozin (JARDIANCE) 10 MG TABS tablet Take 1 tablet (10 mg total) by mouth daily.  Marland Kitchen ezetimibe (ZETIA) 10 MG tablet Take 10 mg by mouth daily.  . furosemide (LASIX) 80 MG tablet Take 0.5 tablets (40 mg total) by mouth daily.  . sacubitril-valsartan  (ENTRESTO) 24-26 MG Take 1 tablet by mouth 2 (two) times daily.   No facility-administered encounter medications on file as of 11/18/2019.     Review of Systems  Review of Systems  Constitutional: Negative.  Negative for fatigue and fever.  HENT: Negative.   Respiratory: Positive for cough. Negative for shortness of breath.   Cardiovascular: Negative.  Negative for chest pain, palpitations and leg swelling.  Gastrointestinal: Negative.   Allergic/Immunologic: Negative.   Neurological: Negative.   Psychiatric/Behavioral: Negative.        Physical Exam  BP 128/72   Pulse 76   Temp (!) 97.3 F (36.3 C)   Wt 182 lb (82.6 kg)   SpO2 98%   BMI 33.29 kg/m   Wt Readings from Last 5 Encounters:  11/18/19 182 lb (82.6 kg)  10/24/19 178 lb (80.7 kg)  09/24/19 183 lb 4.8 oz (83.1 kg)  06/25/19 176 lb 9.6 oz (80.1 kg)  03/26/19 173 lb 6.4 oz (78.7 kg)     Physical Exam Vitals and nursing note reviewed.  Constitutional:      General: She is not in acute distress.    Appearance: She is well-developed.  Cardiovascular:     Rate and Rhythm: Normal rate and regular rhythm.  Pulmonary:     Effort: Pulmonary effort is normal.     Breath sounds: Normal breath sounds.  Musculoskeletal:     Right lower leg: No edema.     Left lower leg: No edema.  Neurological:     Mental Status: She is alert and oriented to person, place, and time.  Psychiatric:        Mood and Affect: Mood normal.        Behavior: Behavior normal.      Imaging: DG Chest Port 1 View  Result Date: 10/24/2019 CLINICAL DATA:  COVID positive. Coughing. EXAM: PORTABLE CHEST 1 VIEW COMPARISON:  [facility] 2019 FINDINGS: Postsurgical changes of CABG. Enlarged cardiac silhouette.  Mediastinal contours appear intact. Minimal reticular airspace opacities in bilateral lower lobes. Osseous structures are without acute abnormality. Soft tissues are grossly normal. IMPRESSION: 1. Minimal reticular airspace opacities in  bilateral lower lobes may represent mild atypical/viral pneumonia. 2. Enlarged cardiac silhouette. Electronically Signed   By: Fidela Salisbury M.D.   On: 10/24/2019 20:50     Assessment & Plan:   History of COVID-19 Cough:   Stay well hydrated  Stay active  Deep breathing exercises  May start vitamin C daily, vitamin D3 daily, Zinc daily  May take tylenol or fever or pain  May take mucinex twice daily  Will order chest x ray   Follow up:  Follow up in 2 weeks or sooner if needed      Fenton Foy, NP 11/19/2019

## 2019-11-18 NOTE — Patient Instructions (Signed)
Covid 19 Cough:   Stay well hydrated  Stay active  Deep breathing exercises  May start vitamin C daily, vitamin D3 daily, Zinc daily  May take tylenol or fever or pain  May take mucinex twice daily  Will order chest x ray   Follow up:  Follow up in 2 weeks or sooner if needed

## 2019-11-19 DIAGNOSIS — Z8616 Personal history of COVID-19: Secondary | ICD-10-CM | POA: Insufficient documentation

## 2019-11-19 NOTE — Assessment & Plan Note (Signed)
Cough:   Stay well hydrated  Stay active  Deep breathing exercises  May start vitamin C daily, vitamin D3 daily, Zinc daily  May take tylenol or fever or pain  May take mucinex twice daily  Will order chest x ray   Follow up:  Follow up in 2 weeks or sooner if needed

## 2019-11-20 ENCOUNTER — Ambulatory Visit
Admission: RE | Admit: 2019-11-20 | Discharge: 2019-11-20 | Disposition: A | Discharge status: Home / Self Care | Payer: Medicare Other | Source: Ambulatory Visit | Attending: Nurse Practitioner | Admitting: Nurse Practitioner

## 2019-11-20 ENCOUNTER — Other Ambulatory Visit: Payer: Self-pay

## 2019-11-24 ENCOUNTER — Other Ambulatory Visit: Payer: Self-pay | Admitting: Nurse Practitioner

## 2019-11-24 MED ORDER — PREDNISONE 20 MG PO TABS: 20.0000 mg | ORAL_TABLET | Freq: Every day | ORAL | 0 refills | Status: AC

## 2020-02-13 ENCOUNTER — Other Ambulatory Visit: Payer: Self-pay | Admitting: Cardiology

## 2020-02-13 DIAGNOSIS — I5022 Chronic systolic (congestive) heart failure: Secondary | ICD-10-CM

## 2020-02-19 ENCOUNTER — Other Ambulatory Visit: Payer: Self-pay | Admitting: Cardiology

## 2020-05-14 NOTE — Progress Notes (Signed)
HPI: FU CAD; previously admitted with CHF; echo 1/16 showed EF 25-30, mild to moderate MR, mild LAE/RAE/RVE; moderately reduced RV function; small pericardial effusion. Carotid dopplers 1/16 showed 1-39 bilateral stenosis. Cath revealed 3 vessel CAD and EF 30-35. Had CABG with LIMA to LAD, seq svg to first and second diagonal, svg to OM, svg to PDA. Patient previously referred to electrophysiology for consideration of ICD but patient declined.Admitted August 2019 with congestive heart failure and diuresed. Last echocardiogram repeated March 2021 and showed ejection fraction 30 to [id number], grade 2 diastolic dysfunction, moderate left atrial enlargement, moderate mitral regurgitation.  Since last seen,the patient has dyspnea with more extreme activities but not with routine activities. It is relieved with rest. It is not associated with chest pain. There is no orthopnea, PND. There is no syncope or palpitations. There is no exertional chest pain.  Occasional minimal pedal edema.   Current Outpatient Medications  Medication Sig Dispense Refill  . aspirin 81 MG tablet Take 1 tablet (81 mg total) by mouth daily.    Marland Kitchen atorvastatin (LIPITOR) 80 MG tablet TAKE ONE TABLET BY MOUTH DAILY 90 tablet 3  . calcium carbonate (OS-CAL - DOSED IN MG OF ELEMENTAL CALCIUM) 1250 (500 Ca) MG tablet Take 1 tablet by mouth daily.    . carvedilol (COREG) 25 MG tablet TAKE ONE TABLET BY MOUTH TWICE A DAY WITH A MEAL 180 tablet 3  . cholecalciferol (VITAMIN D3) 25 MCG (1000 UT) tablet Take 1 tablet by mouth daily.    . empagliflozin (JARDIANCE) 10 MG TABS tablet Take 1 tablet (10 mg total) by mouth daily. 90 tablet 1  . ENTRESTO 24-26 MG TAKE ONE TABLET BY MOUTH TWICE A DAY 180 tablet 0  . ezetimibe (ZETIA) 10 MG tablet Take 10 mg by mouth daily.    . furosemide (LASIX) 80 MG tablet TAKE ONE TABLET BY MOUTH DAILY 90 tablet 3   No current facility-administered medications for this visit.     Past Medical History:   Diagnosis Date  . Acute exacerbation of CHF (congestive heart failure) ([facility]) 09/12/2017  . Acute gallstone pancreatitis   . Acute gallstone pancreatitis   . Acute on chronic systolic heart failure ([place])   . Bilateral pleural effusion 02/22/2014  . Cardiomyopathy, ischemic, EF 25-30% 03/23/2014   03/01/2014- Date of procedure- CABG- X5- Severe 3-vessel coronary artery disease with severe LV dysfunction. Ejection fraction 25-30%. SURGICAL PROCEDURE: Coronary artery bypass grafting x 5. SURGEON: Lanelle Bal, MD.    . Cardiomyopathy, ischemic, EF 25-30% 03/23/2014   03/01/2014- Date of procedure- CABG- X5- Severe 3-vessel coronary artery disease with severe LV dysfunction. Ejection fraction 25-30%. SURGICAL PROCEDURE: Coronary artery bypass grafting x 5. SURGEON: Lanelle Bal, MD.    . Chronic systolic heart failure ([place])   . Coronary artery disease involving native coronary artery of native heart without angina pectoris   . Diabetes mellitus 1998   Dx in 1998. Microalbuminuria, never on insulin.  . Diabetes mellitus type 2, controlled, without complications ([place]) 123456   Last A1C 6.7 (03/21/17)    On Diet Control Only Last Foot Exam: 03/21/17    Last eye exam: to be scheduled early 2019   . Dyspnea   . Essential hypertension 12/05/2005   Lisinopril '40mg'$  Daily, Coreg '25mg'$  BID.   Marland Kitchen GERD 12/05/2005   Qualifier: Diagnosis of  By: Prudencio Burly MD, Phillip Heal    . GERD (gastroesophageal reflux disease)   . History of blurry vision 06/09   Hospitalized  for this  . Hyperlipidemia   . Hypertension   . Non-intractable vomiting 12/31/2017  . Personal history of colonic adenomas 10/07/2007  . Pneumonia 01/2014  . 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 Viral URI with cough 03/21/2017    Past Surgical History:  Procedure Laterality Date  . CHOLECYSTECTOMY N/A 08/06/2016   Procedure: LAPAROSCOPIC CHOLECYSTECTOMY WITH INTRAOPERATIVE CHOLANGIOGRAM;  Surgeon: Georganna Skeans, MD;  Location: [facility];  Service: General;   Laterality: N/A;  . CORONARY ARTERY BYPASS GRAFT N/A 03/01/2014   Procedure: CORONARY ARTERY BYPASS GRAFTING (CABG) x five, using left internal mammary artery and right leg greater saphenous vein harvested endoscopically;  Surgeon: Grace Isaac, MD;  Location: [facility];  Service: Open Heart Surgery;  Laterality: N/A;  . INTRAOPERATIVE TRANSESOPHAGEAL ECHOCARDIOGRAM N/A 03/01/2014   Procedure: INTRAOPERATIVE TRANSESOPHAGEAL ECHOCARDIOGRAM;  Surgeon: Grace Isaac, MD;  Location: [facility];  Service: Open Heart Surgery;  Laterality: N/A;  . LEFT AND RIGHT HEART CATHETERIZATION WITH CORONARY ANGIOGRAM N/A 02/26/2014   Procedure: LEFT AND RIGHT HEART CATHETERIZATION WITH CORONARY ANGIOGRAM;  Surgeon: Sinclair Grooms, MD;  Location: [facility] CATH LAB;  Service: Cardiovascular;  Laterality: N/A;  . TOTAL ABDOMINAL HYSTERECTOMY W/ BILATERAL SALPINGOOPHORECTOMY  1996    Social History   Socioeconomic History  . Marital status: Widowed    Spouse name: Not on file  . Number of children: Not on file  . Years of education: Not on file  . Highest education level: Not on file  Occupational History  . Occupation: Automotive engineer  Tobacco Use  . Smoking status: Never Smoker  . Smokeless tobacco: Never Used  Vaping Use  . Vaping Use: Never used  Substance and Sexual Activity  . Alcohol use: No    Alcohol/week: 0.0 standard drinks  . Drug use: No  . Sexual activity: Not on file  Other Topics Concern  . Not on file  Social History Narrative   Married x 42 yrs.  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   Never smoked. Doesn't drink or use drugs.   Does Patient Exercise:  yes - sometimes      To get diabetes supplies for [name] RD, CDE, April 20,2011   Social Determinants of Health   Financial Resource Strain: Not on file  Food Insecurity: Not on file  Transportation Needs: Not on file  Physical Activity: Not on file  Stress:  Not on file  Social Connections: Not on file  Intimate Partner Violence: Not on file    Family History  Problem Relation Age of Onset  . Diabetes Mother        Mother died of MI at age 45  . Heart failure Mother   . Kidney failure Mother   . Heart failure Sister        Died  . Heart failure Brother        Died  . Diabetes Brother        Died    ROS: no fevers or chills, productive cough, hemoptysis, dysphasia, odynophagia, melena, hematochezia, dysuria, hematuria, rash, seizure activity, orthopnea, PND, claudication. Remaining systems are negative.  Physical Exam: Well-developed well-nourished in no acute distress.  Skin is warm and dry.  HEENT is normal.  Neck is supple.  Chest is clear to auscultation with normal expansion.  Cardiovascular exam is regular rate and rhythm.  Abdominal exam nontender or distended. No masses palpated. Extremities show trace edema. neuro grossly intact  A/P  1 chronic systolic congestive heart failure-patient is euvolemic today.  We will continue diuretics at present dose.  Check potassium and renal function.  2 ischemic cardiomyopathy-continue Entresto and beta-blocker.  She declines ICD and understands the risk of sudden death.  3 coronary artery disease-continue aspirin and statin.  4 moderate mitral regurgitation-schedule follow-up echocardiogram.  5 hypertension-blood pressure controlled.  Continue present medical regimen.  6 hyperlipidemia-continue statin.  Check lipids and liver.  Kirk Ruths, MD

## 2020-05-19 ENCOUNTER — Other Ambulatory Visit: Payer: Self-pay

## 2020-05-19 ENCOUNTER — Encounter: Payer: Self-pay | Admitting: Cardiology

## 2020-05-19 ENCOUNTER — Ambulatory Visit (INDEPENDENT_AMBULATORY_CARE_PROVIDER_SITE_OTHER): Payer: Medicare Other | Admitting: Cardiology

## 2020-05-19 VITALS — BP 112/62 | HR 88 | Ht 62.0 in | Wt 192.6 lb

## 2020-05-19 DIAGNOSIS — I10 Essential (primary) hypertension: Secondary | ICD-10-CM

## 2020-05-19 DIAGNOSIS — I255 Ischemic cardiomyopathy: Secondary | ICD-10-CM

## 2020-05-19 DIAGNOSIS — I5022 Chronic systolic (congestive) heart failure: Secondary | ICD-10-CM | POA: Diagnosis not present

## 2020-05-19 DIAGNOSIS — E7800 Pure hypercholesterolemia, unspecified: Secondary | ICD-10-CM

## 2020-05-19 DIAGNOSIS — I2510 Atherosclerotic heart disease of native coronary artery without angina pectoris: Secondary | ICD-10-CM | POA: Diagnosis not present

## 2020-05-19 NOTE — Patient Instructions (Signed)

## 2020-06-12 ENCOUNTER — Other Ambulatory Visit: Payer: Self-pay | Admitting: Student

## 2020-06-12 DIAGNOSIS — E119 Type 2 diabetes mellitus without complications: Secondary | ICD-10-CM

## 2020-06-13 ENCOUNTER — Other Ambulatory Visit: Payer: Self-pay | Admitting: Cardiology

## 2020-06-17 ENCOUNTER — Ambulatory Visit (HOSPITAL_COMMUNITY): Payer: Medicare Other | Attending: Internal Medicine

## 2020-06-17 ENCOUNTER — Other Ambulatory Visit: Payer: Self-pay

## 2020-06-17 DIAGNOSIS — I5022 Chronic systolic (congestive) heart failure: Secondary | ICD-10-CM | POA: Insufficient documentation

## 2020-06-17 DIAGNOSIS — I255 Ischemic cardiomyopathy: Secondary | ICD-10-CM | POA: Diagnosis not present

## 2020-06-17 LAB — ECHOCARDIOGRAM COMPLETE
Area-P 1/2: 3.66 cm2
MV M vel: 5.19 m/s
MV Peak grad: 107.7 mmHg
Radius: 0.4 cm
S' Lateral: 4.3 cm

## 2020-06-18 LAB — COMPREHENSIVE METABOLIC PANEL
ALT: 10 IU/L (ref 0–32)
AST: 14 IU/L (ref 0–40)
Albumin/Globulin Ratio: 1 — ABNORMAL LOW (ref 1.2–2.2)
Albumin: 3.8 g/dL (ref 3.8–4.8)
Alkaline Phosphatase: 109 IU/L (ref 44–121)
BUN/Creatinine Ratio: 17 (ref 12–28)
BUN: 37 mg/dL — ABNORMAL HIGH (ref 8–27)
Bilirubin Total: 0.3 mg/dL (ref 0.0–1.2)
CO2: 24 mmol/L (ref 20–29)
Calcium: 9.2 mg/dL (ref 8.7–10.3)
Chloride: 101 mmol/L (ref 96–106)
Creatinine, Ser: 2.21 mg/dL — ABNORMAL HIGH (ref 0.57–1.00)
Globulin, Total: 3.7 g/dL (ref 1.5–4.5)
Glucose: 133 mg/dL — ABNORMAL HIGH (ref 65–99)
Potassium: 5.1 mmol/L (ref 3.5–5.2)
Sodium: 140 mmol/L (ref 134–144)
Total Protein: 7.5 g/dL (ref 6.0–8.5)
eGFR: 23 mL/min/{1.73_m2} — ABNORMAL LOW (ref >59)

## 2020-06-18 LAB — LIPID PANEL
Chol/HDL Ratio: 2.4 ratio (ref 0.0–4.4)
Cholesterol, Total: 155 mg/dL (ref 100–199)
HDL: 64 mg/dL (ref >39)
LDL Chol Calc (NIH): 74 mg/dL (ref 0–99)
Triglycerides: 93 mg/dL (ref 0–149)
VLDL Cholesterol Cal: 17 mg/dL (ref 5–40)

## 2020-06-20 ENCOUNTER — Telehealth: Payer: Self-pay | Admitting: *Deleted

## 2020-06-20 DIAGNOSIS — N289 Disorder of kidney and ureter, unspecified: Secondary | ICD-10-CM

## 2020-06-20 NOTE — Telephone Encounter (Signed)
-----   Message from Lelon Perla, MD sent at 06/20/2020  7:06 AM EDT ----- Make sure pt is seeing nephrology Kirk Ruths

## 2020-06-20 NOTE — Telephone Encounter (Signed)
Spoke with pt, she is not seeing anyone about her kidney function, referral placed for nephrology.

## 2020-07-01 NOTE — Telephone Encounter (Signed)
Spoke with pt, she is aware of the appointment with [facility] 07/28/20 @ 3 pm.

## 2020-07-13 ENCOUNTER — Encounter: Payer: Self-pay | Admitting: *Deleted

## 2020-07-21 ENCOUNTER — Other Ambulatory Visit: Payer: Self-pay

## 2020-07-21 DIAGNOSIS — E119 Type 2 diabetes mellitus without complications: Secondary | ICD-10-CM

## 2020-07-21 MED ORDER — EMPAGLIFLOZIN 10 MG PO TABS: 10.0000 mg | ORAL_TABLET | Freq: Every day | ORAL | 0 refills | Status: DC

## 2020-08-02 ENCOUNTER — Other Ambulatory Visit: Payer: Self-pay | Admitting: Nephrology

## 2020-08-02 DIAGNOSIS — N184 Chronic kidney disease, stage 4 (severe): Secondary | ICD-10-CM

## 2020-08-11 ENCOUNTER — Other Ambulatory Visit: Payer: Self-pay | Admitting: Cardiology

## 2020-08-20 ENCOUNTER — Other Ambulatory Visit: Payer: Self-pay | Admitting: Internal Medicine

## 2020-08-20 DIAGNOSIS — E119 Type 2 diabetes mellitus without complications: Secondary | ICD-10-CM

## 2020-08-22 ENCOUNTER — Other Ambulatory Visit: Payer: Medicare Other

## 2020-09-15 ENCOUNTER — Encounter: Payer: Self-pay | Admitting: Student

## 2020-09-15 ENCOUNTER — Ambulatory Visit (INDEPENDENT_AMBULATORY_CARE_PROVIDER_SITE_OTHER): Payer: Medicare Other | Admitting: Student

## 2020-09-15 ENCOUNTER — Other Ambulatory Visit: Payer: Self-pay

## 2020-09-15 VITALS — BP 99/50 | HR 75 | Temp 97.7°F | Ht 62.0 in | Wt 190.3 lb

## 2020-09-15 DIAGNOSIS — Z23 Encounter for immunization: Secondary | ICD-10-CM

## 2020-09-15 DIAGNOSIS — E119 Type 2 diabetes mellitus without complications: Secondary | ICD-10-CM

## 2020-09-15 DIAGNOSIS — I10 Essential (primary) hypertension: Secondary | ICD-10-CM | POA: Diagnosis not present

## 2020-09-15 DIAGNOSIS — I5022 Chronic systolic (congestive) heart failure: Secondary | ICD-10-CM

## 2020-09-15 DIAGNOSIS — Z0000 Encounter for general adult medical examination without abnormal findings: Secondary | ICD-10-CM

## 2020-09-15 DIAGNOSIS — I2510 Atherosclerotic heart disease of native coronary artery without angina pectoris: Secondary | ICD-10-CM

## 2020-09-15 DIAGNOSIS — N184 Chronic kidney disease, stage 4 (severe): Secondary | ICD-10-CM

## 2020-09-15 LAB — GLUCOSE, CAPILLARY: Glucose-Capillary: 199 mg/dL — ABNORMAL HIGH (ref 70–99)

## 2020-09-15 LAB — POCT GLYCOSYLATED HEMOGLOBIN (HGB A1C): Hemoglobin A1C: 8.1 % — AB (ref 4.0–5.6)

## 2020-09-15 MED ORDER — SITAGLIPTIN PHOSPHATE 25 MG PO TABS: 25.0000 mg | ORAL_TABLET | Freq: Every day | ORAL | 2 refills | Status: DC

## 2020-09-15 NOTE — Assessment & Plan Note (Signed)
-  Continue atorvastatin and Zetia (last LDL 74) -Continue aspirin

## 2020-09-15 NOTE — Assessment & Plan Note (Addendum)
Patient living with HFrEF secondary to CAD.  Echocardiogram in May 2022 show EF 30-35%.  She had a CABG procedure and declined ICD.  She last saw her cardiologist in April.  She was taking Entresto, Coreg, Jardiance and Lasix.  Last BMP checked at cardiology's office showed a rise in creatinine from 1.8 to 2.2.  Her Lasix then was decreased from 80 to 40 mg.  She states that the decreased dose of Lasix causing worsening edema and shortness of breath.  She was seen by nephrology in June and had a repeat BMP which showed improvement of creatinine from 2.2 to 1.98.  Potassium was 5.3.  Protein/creatinine ratio showed unremarkable proteinuria.  She was advised to resume her Lasix 80 mg.  BMP was checked 2 weeks later showed creatinine of 2.1 and potassium of 4.5.  She will follow-up with nephrology in September.  Today patient reports feeling well.  She denies chest pain, shortness of breath, orthopnea, dyspnea on exertion.  Reports stable weight and lower extremity edema.  Physical exam did not show any significant fluid overload except for stable +2 bilateral lower extremity edema.  Respiratory status is normal.  No change in her current regimen.  -Continue Entresto, Coreg, Jardiance, Lasix 80 mg -Follow-up with cardiology as scheduled -Follow-up with nephrology as scheduled

## 2020-09-15 NOTE — Assessment & Plan Note (Addendum)
Her blood pressure is on the softer side but still within acceptable range.  Patient denies dizziness, orthostatic hypotension or syncopal episode.  -Advised patient to avoid dehydration -Continue Entresto, Coreg and Lasix. -If her blood pressure is persistently low, will need to decrease the dose of Entresto

## 2020-09-15 NOTE — Progress Notes (Signed)
CC: Follow-up on type 2 diabetes  HPI:  [name] is a 70 y.o. with past medical history of hypertension, HFrEF, type 2 diabetes, who presented to the clinic for follow-up on her type 2 diabetes.  Please see problem based charting for detail  Past Medical History:  Diagnosis Date   Acute exacerbation of CHF (congestive heart failure) ([place]) 09/12/2017   Acute gallstone pancreatitis    Acute gallstone pancreatitis    Acute on chronic systolic heart failure ([facility])    Bilateral pleural effusion 02/22/2014   Cardiomyopathy, ischemic, EF 25-30% 03/23/2014   03/01/2014- Date of procedure- CABG- X5- Severe 3-vessel coronary artery disease with severe LV dysfunction.  Ejection fraction 25-30%.  SURGICAL PROCEDURE:  Coronary artery bypass grafting x 5. SURGEON:  Lanelle Bal, MD.      Cardiomyopathy, ischemic, EF 25-30% 03/23/2014   03/01/2014- Date of procedure- CABG- X5- Severe 3-vessel coronary artery disease with severe LV dysfunction.  Ejection fraction 25-30%.  SURGICAL PROCEDURE:  Coronary artery bypass grafting x 5. SURGEON:  Lanelle Bal, MD.      Chronic systolic heart failure [facility])    Coronary artery disease involving native coronary artery of native heart without angina pectoris    Diabetes mellitus 1998   Dx in 1998. Microalbuminuria, never on insulin.   Diabetes mellitus type 2, controlled, without complications ([place]) 123456   Last A1C 6.7 (03/21/17)    On Diet Control Only Last Foot Exam: 03/21/17    Last eye exam: to be scheduled early 2019    Dyspnea    Essential hypertension 12/05/2005   Lisinopril '40mg'$  Daily, Coreg '25mg'$  BID.    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    Viral URI with cough 03/21/2017   Review of Systems:    Per HPI  Physical Exam:  Vitals:   09/15/20 1315  BP: 96/61  Pulse: 84  Temp: 97.7 F (36.5 C)  TempSrc: Oral  SpO2: 100%  Weight: 190 lb 4.8 oz (86.3 kg)  Height: '5\' 2"'$  (1.575 m)  Physical Exam Constitutional:      General: She is not in acute distress.    Appearance: She is not toxic-appearing.  HENT:     Head: Normocephalic.  Eyes:     Conjunctiva/sclera: Conjunctivae normal.  Cardiovascular:     Rate and Rhythm: Normal rate and regular rhythm.     Heart sounds: Normal heart sounds.     Comments: No JVD +2 edema bilateral lower extremity Pulmonary:     Effort: Pulmonary effort is normal. No respiratory distress.     Breath sounds: Normal breath sounds. No wheezing.  Abdominal:     Palpations: Abdomen is soft.  Musculoskeletal:        General: Normal range of motion.     Comments: No wound or ulcers of bilateral lower extremities Normal pedis pulses palpated  Skin:    General: Skin is warm.  Neurological:     Mental Status: She is alert and oriented to person, place, and time.  Psychiatric:        Mood and Affect: Mood normal.        Behavior: Behavior normal.  Assessment & Plan:   See Encounters Tab for problem based charting.  Patient discussed with Dr. Evette Doffing

## 2020-09-15 NOTE — Patient Instructions (Signed)
[name],  It was a pleasure seeing you in the clinic today.  Here is a summary of what we talked about:  1.  Heart failure.  I am glad that you are doing well.  Please continue all your medications as instructed.  Please follow-up with your heart doctor.  2.  Type 2 diabetes: Your A1c is 8.1 today.  I will add a medication called sitagliptin to your regimen.  Please take 1 tablet a day.  3.  Chronic kidney disease: Please follow-up with your kidney doctor as scheduled.  Please return in 3 months  Take care,  Dr. Alfonse Spruce

## 2020-09-15 NOTE — Assessment & Plan Note (Signed)
A1c increased from 7.8 to 8.1.  Patient currently taking Jardiance 10 mg.  She denies any hypoglycemic events.  Denies any tingling or numbness of her bilateral feet.  Denies any open wounds or ulcers of bilateral lower extremity.  Because of her CKD 4, will avoid GLP-1 agonist.  Will also avoid pioglitazone due to heart failure.  Glipizide can cause hypoglycemia.  -Will start Sitagliptin 25 mg daily (renally dosed) -Continue Jardiance 10 mg -Patient will visit her regular eye doctor for this year eye exam -Foot exam unremarkable -Return in 3 months for A1c recheck

## 2020-09-15 NOTE — Assessment & Plan Note (Signed)
Tdap vaccine given today.  

## 2020-09-15 NOTE — Assessment & Plan Note (Signed)
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  Last saw nephrology in June, next appointment in September.  -Avoid nephrotoxic medications -Continue Entresto, and Lasix -Continue SGLT2 inhibitor for renal protection -Follow-up with nephrology as scheduled

## 2020-09-16 NOTE — Progress Notes (Signed)
Internal Medicine Clinic Attending  Case discussed with Dr. Nguyen  At the time of the visit.  We reviewed the resident's history and exam and pertinent patient test results.  I agree with the assessment, diagnosis, and plan of care documented in the resident's note. 

## 2020-09-24 ENCOUNTER — Other Ambulatory Visit: Payer: Self-pay | Admitting: Cardiology

## 2020-09-29 ENCOUNTER — Other Ambulatory Visit: Payer: Self-pay | Admitting: Student

## 2020-09-29 DIAGNOSIS — E119 Type 2 diabetes mellitus without complications: Secondary | ICD-10-CM

## 2020-10-19 ENCOUNTER — Other Ambulatory Visit: Payer: Self-pay | Admitting: Cardiology

## 2020-10-19 DIAGNOSIS — E785 Hyperlipidemia, unspecified: Secondary | ICD-10-CM

## 2020-10-31 ENCOUNTER — Other Ambulatory Visit: Payer: Self-pay | Admitting: Nephrology

## 2020-10-31 DIAGNOSIS — N184 Chronic kidney disease, stage 4 (severe): Secondary | ICD-10-CM

## 2020-11-14 ENCOUNTER — Ambulatory Visit
Admission: RE | Admit: 2020-11-14 | Discharge: 2020-11-14 | Disposition: A | Discharge status: Home / Self Care | Payer: Medicare Other | Source: Ambulatory Visit | Attending: Nephrology | Admitting: Nephrology

## 2020-11-14 DIAGNOSIS — N184 Chronic kidney disease, stage 4 (severe): Secondary | ICD-10-CM

## 2021-01-13 ENCOUNTER — Other Ambulatory Visit: Payer: Self-pay | Admitting: Student

## 2021-01-13 DIAGNOSIS — E119 Type 2 diabetes mellitus without complications: Secondary | ICD-10-CM

## 2021-02-21 IMAGING — MG DIGITAL SCREENING BILAT W/ TOMO W/ CAD
8 series · 8 of 24 positions shown · non-contrast
Comparison: Previous exam(s).

CLINICAL DATA: Screening.

EXAM:
DIGITAL SCREENING BILATERAL MAMMOGRAM WITH TOMO AND CAD

[L CC synth-2D]
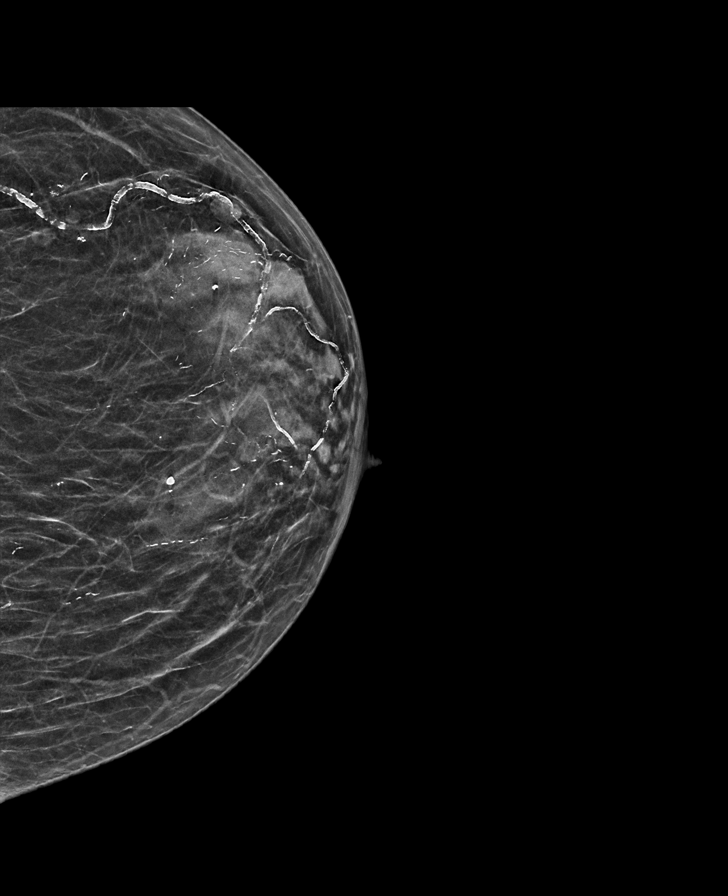

[L MLO synth-2D]
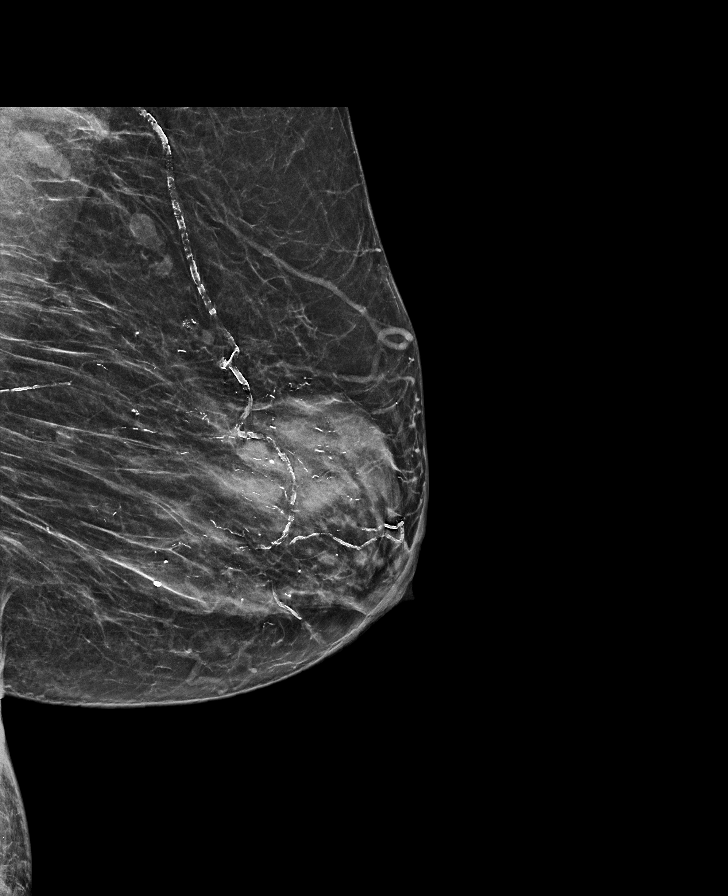

[R MLO synth-2D]
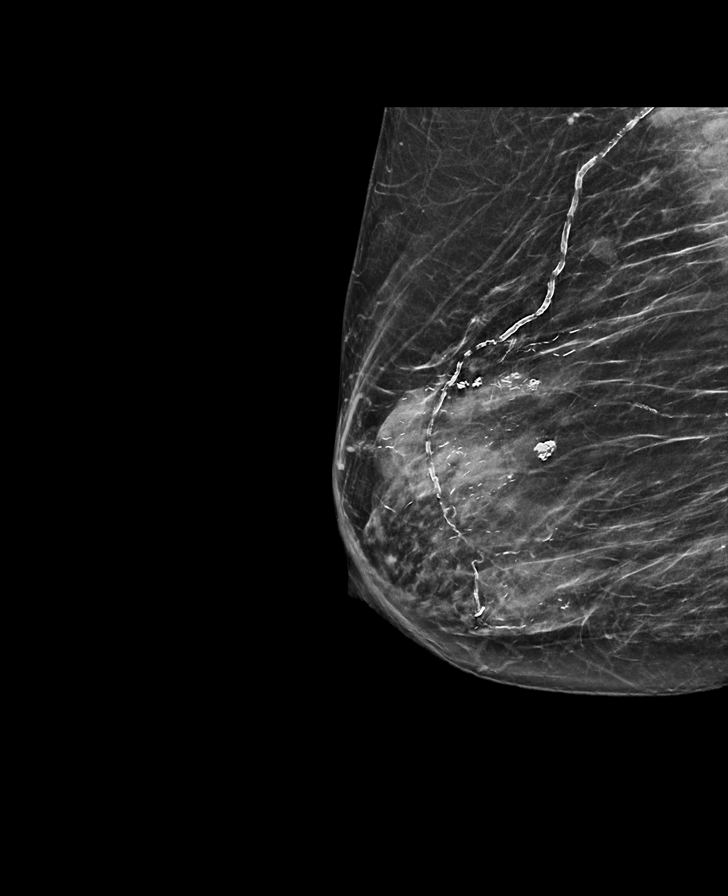

[R CC synth-2D]
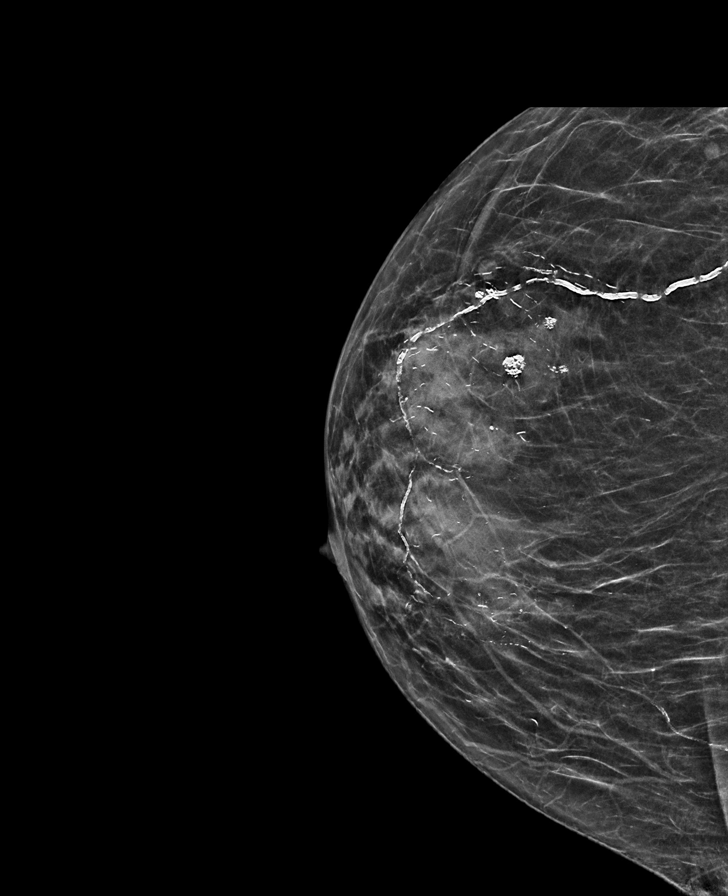

[L MLO tomo · tomo slice 27/53.0]
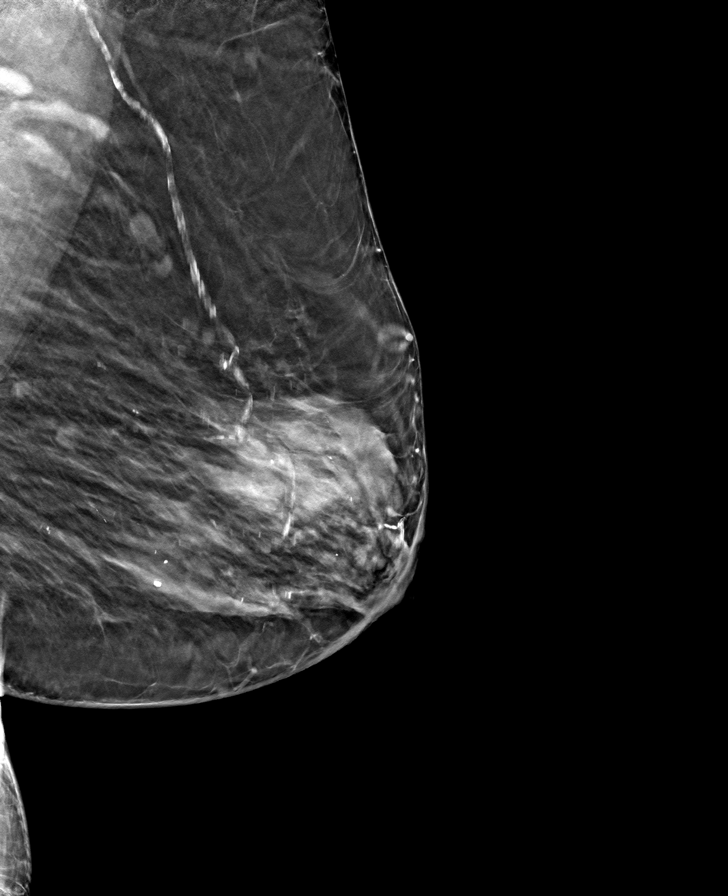

[R MLO tomo · tomo slice 29/57.0]
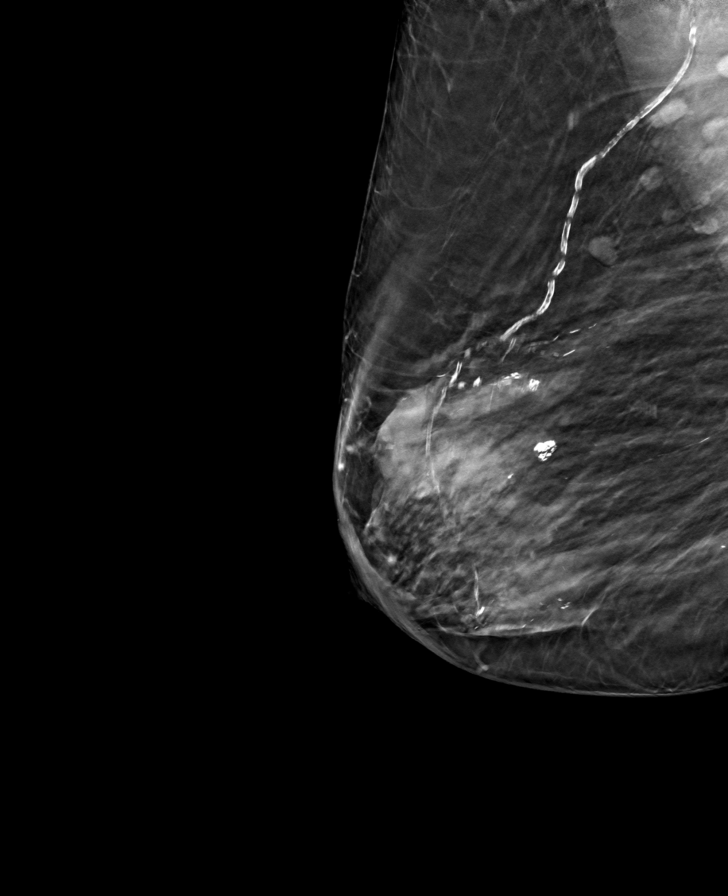

[L CC tomo · tomo slice 23/45.0]
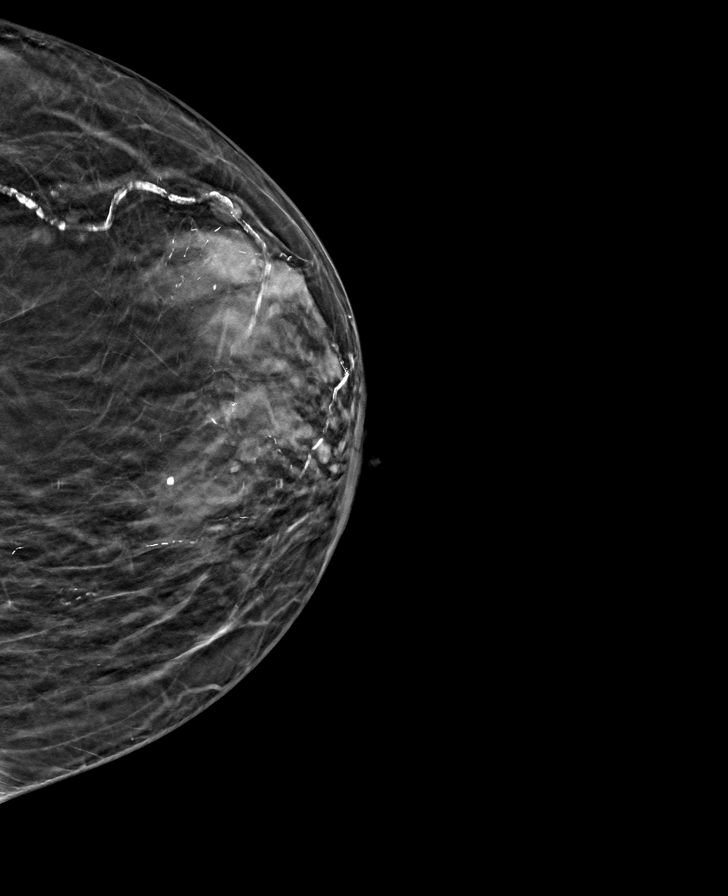

[R CC tomo · tomo slice 25/48.0]
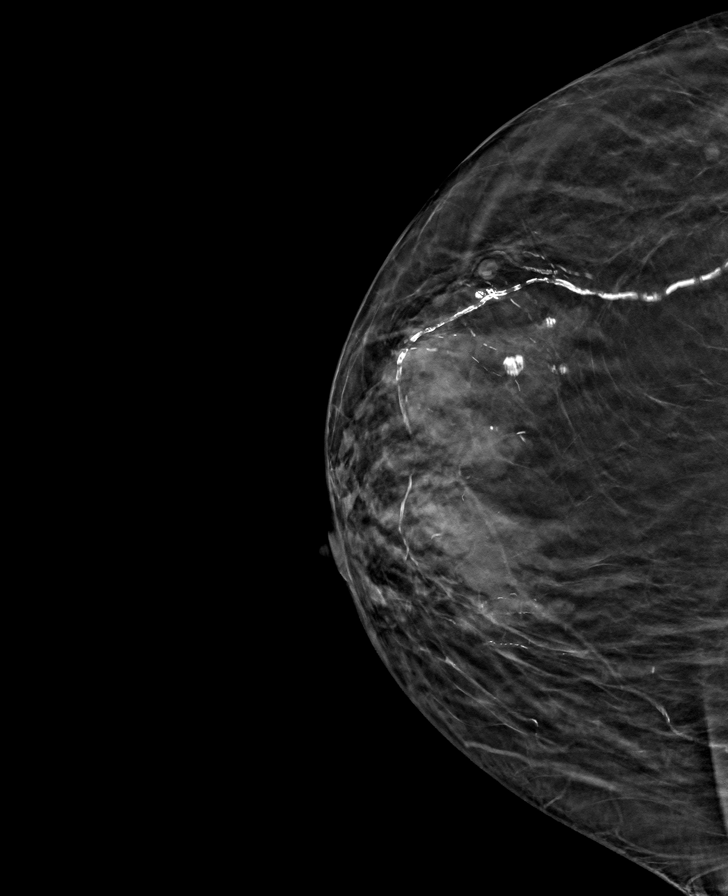

[8 of 24 positions shown; findings below may reference images not displayed]

ACR Breast Density Category c: The breast tissue is heterogeneously
dense, which may obscure small masses.
FINDINGS: There are no findings suspicious for malignancy. Images were
processed with CAD.
IMPRESSION: No mammographic evidence of malignancy. A result letter of this
screening mammogram will be mailed directly to the patient.

RECOMMENDATION:
Screening mammogram in one year. (Code:FT-U-LHB)

BI-RADS CATEGORY  1: Negative.

## 2021-02-26 IMAGING — DX DG CHEST 1V PORT
1 series · 1 of 1 positions shown · non-contrast
Comparison: [facility]

CLINICAL DATA: COVID positive.

Coughing.
EXAM:
PORTABLE CHEST 1 VIEW

[chest ap]
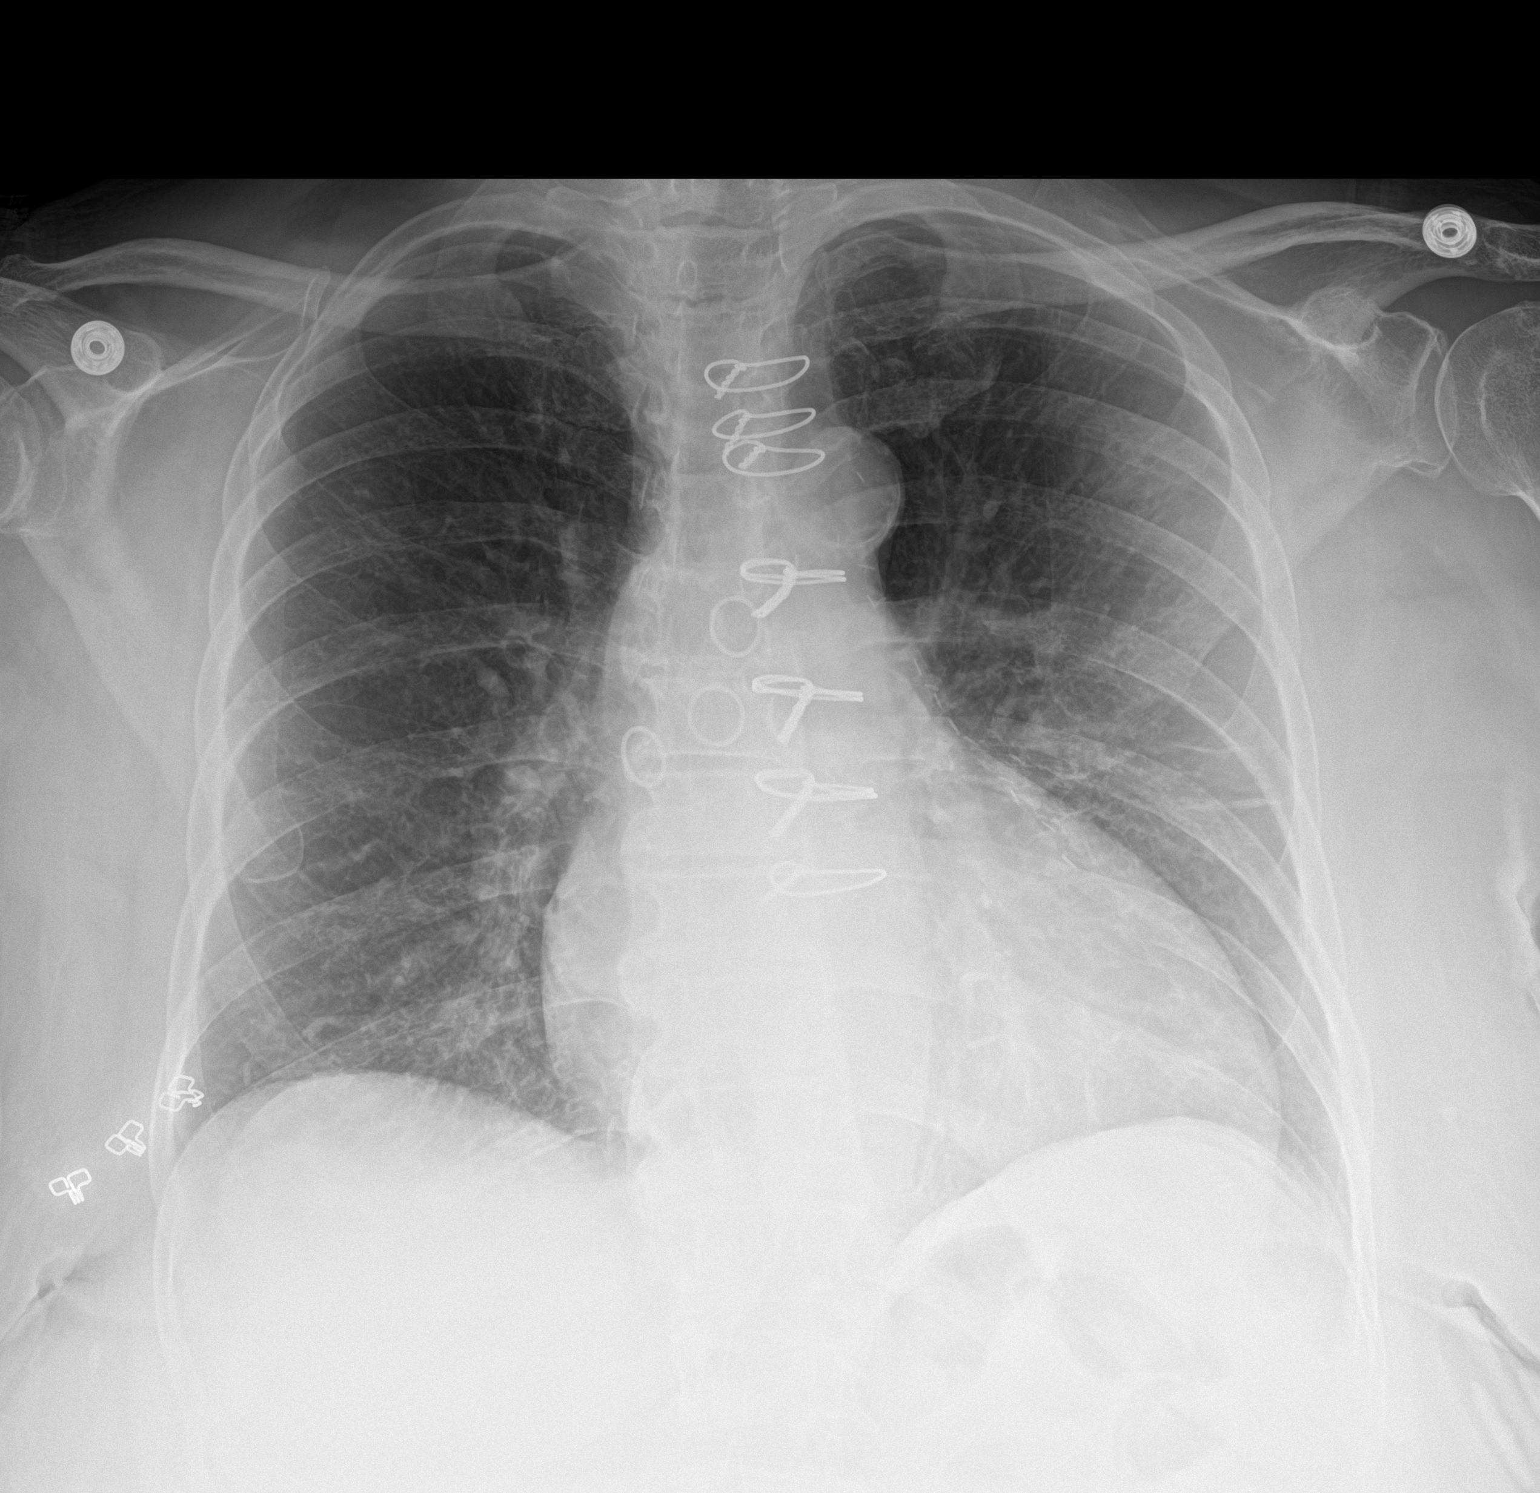

[1 of 1 positions shown; findings below may reference images not displayed]

FINDINGS: Postsurgical changes of CABG.

Enlarged cardiac silhouette.  Mediastinal contours appear intact.

Minimal reticular airspace opacities in bilateral lower lobes.

Osseous structures are without acute abnormality. Soft tissues are
grossly normal.
IMPRESSION: 1. Minimal reticular airspace opacities in bilateral lower lobes may
represent mild atypical/viral pneumonia.
2. Enlarged cardiac silhouette.

## 2021-03-25 IMAGING — CR DG CHEST 2V
2 series · 2 of 2 positions shown · non-contrast
Comparison: 10/24/2019.

CLINICAL DATA: Persistent cough and shortness of breath. Diagnosed
with [id number] infection on 10/24/2019.

EXAM:
CHEST - 2 VIEW

[w chest pa]
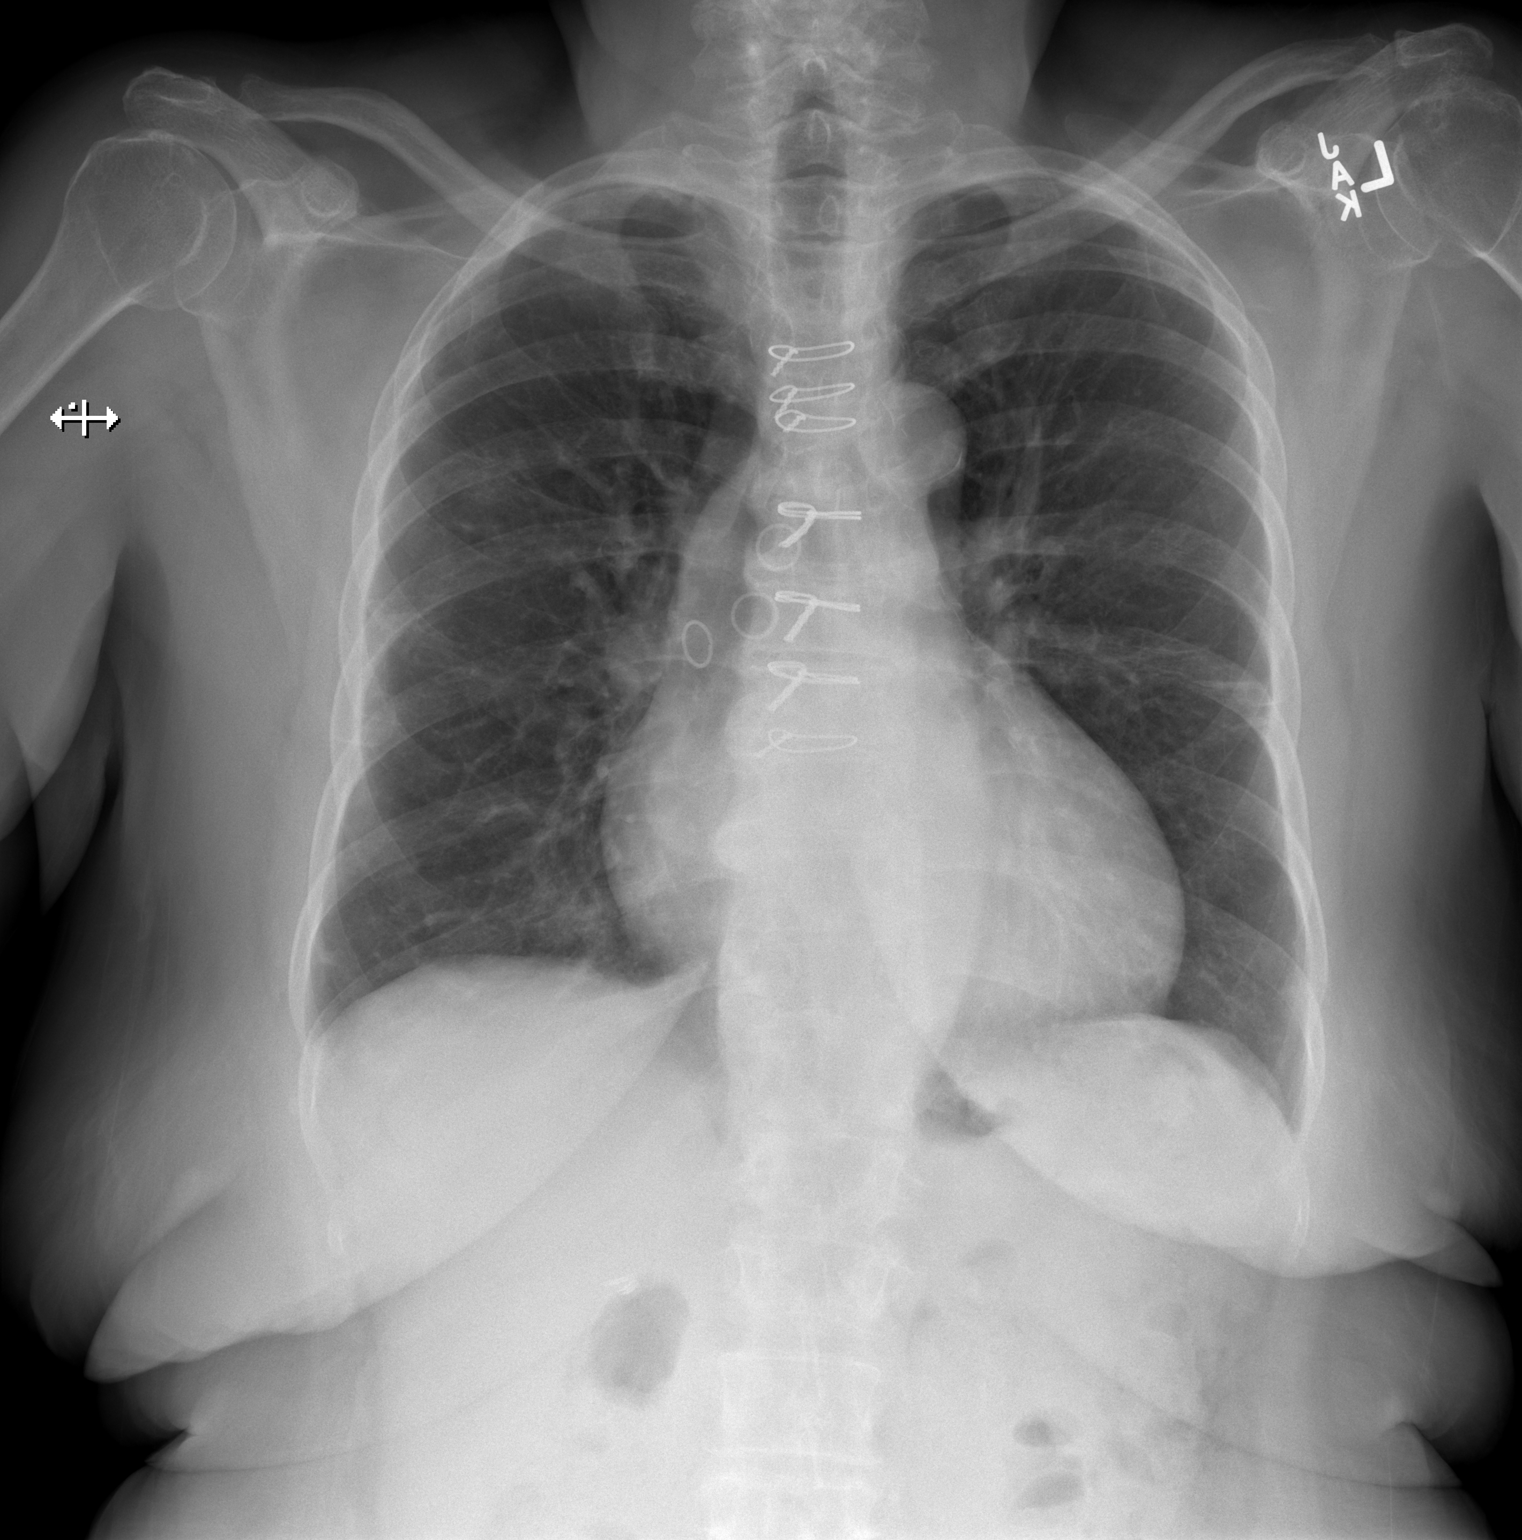

[w chest lat]
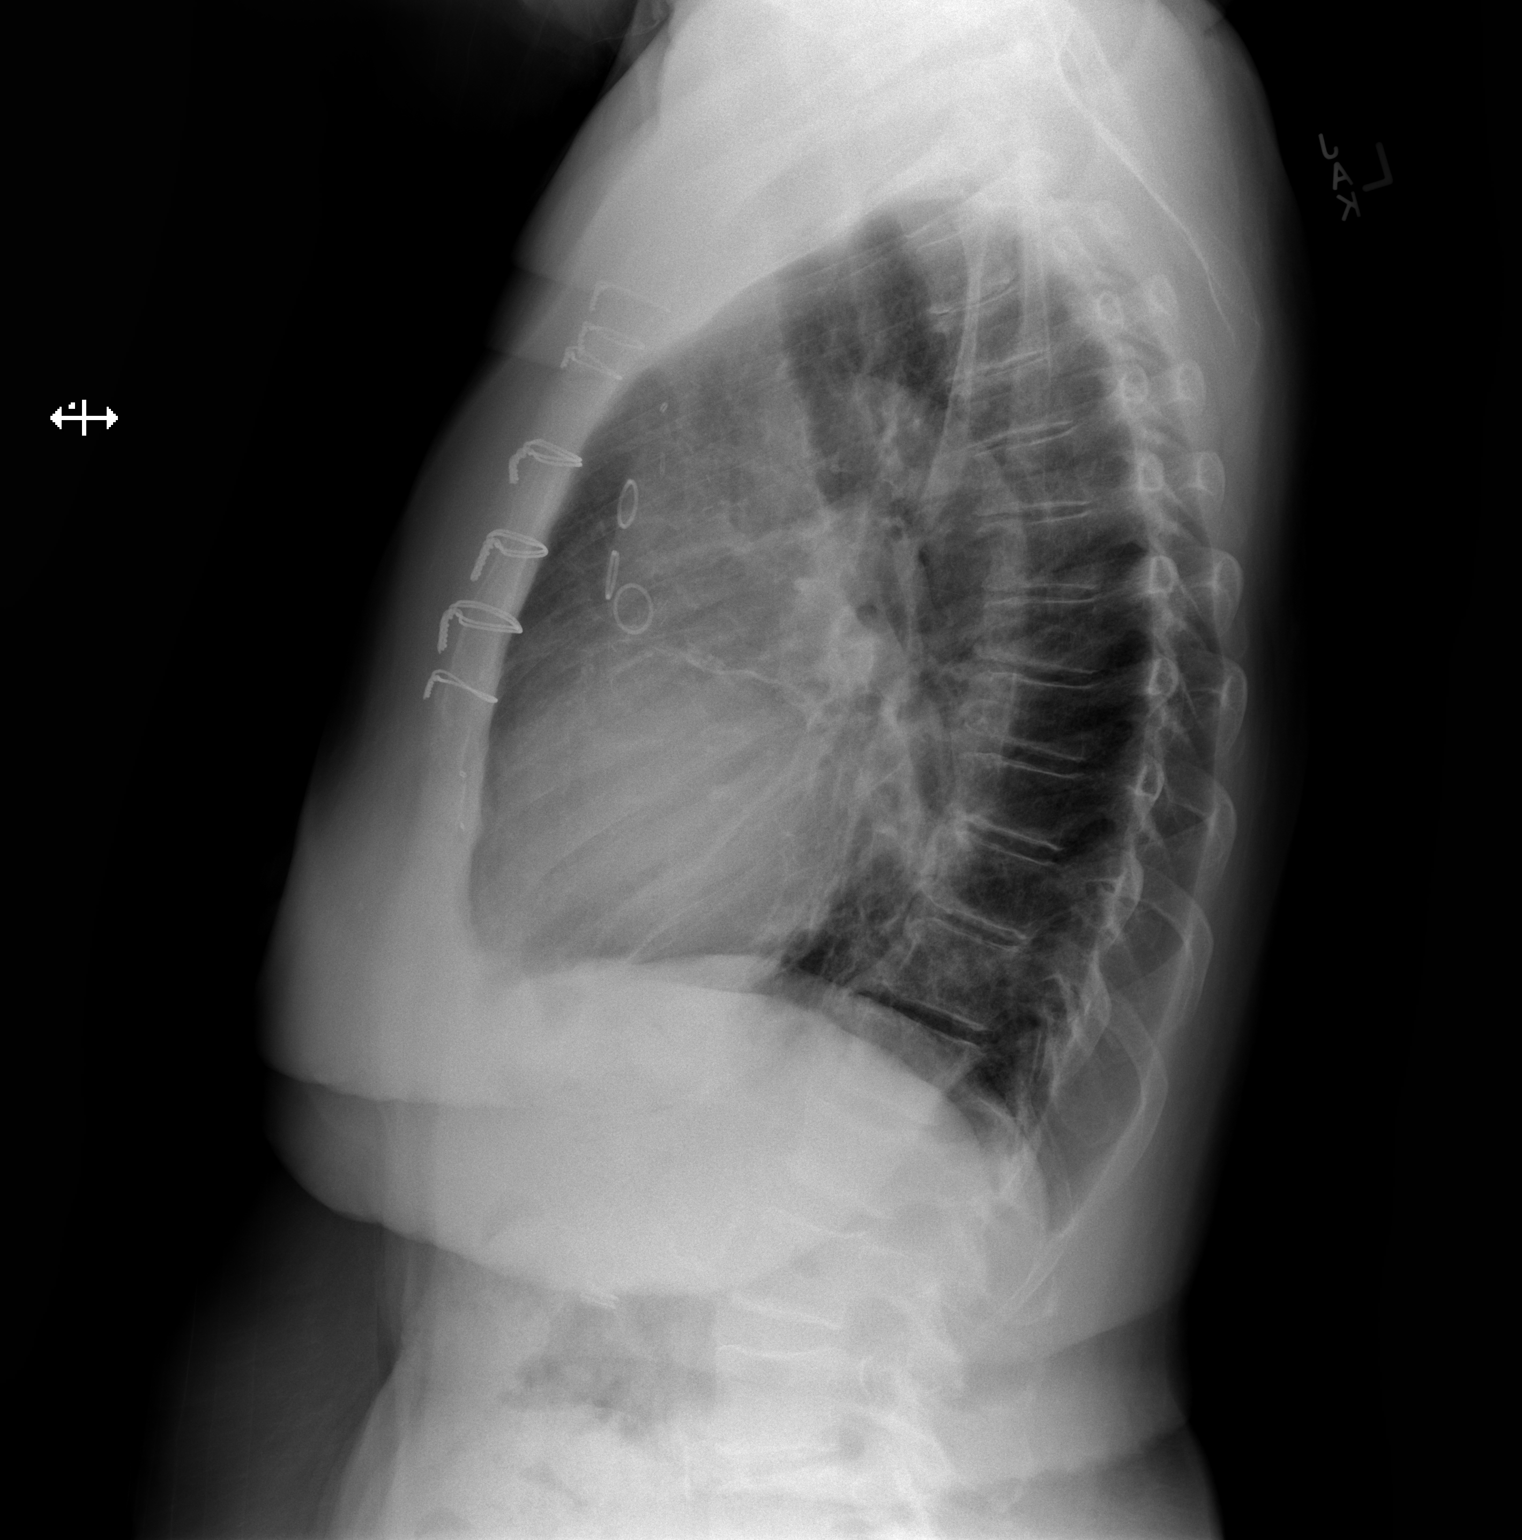

[2 of 2 positions shown; findings below may reference images not displayed]

FINDINGS: Mildly enlarged cardiac silhouette with an interval decrease in
size. Stable post CABG changes. Stable linear scarring in the left
mid lung zone. Interval small amount of linear atelectasis or
scarring in the right mid and lower lung zones. Minimal patchy
opacity at the posterior lung bases on the lateral view. Thoracic
spine degenerative changes. Cholecystectomy clips.
IMPRESSION: 1. Minimal patchy atelectasis or pneumonia at the posterior lung
bases.
2. Mild cardiomegaly with improvement.

## 2021-03-27 ENCOUNTER — Other Ambulatory Visit: Payer: Self-pay

## 2021-03-27 DIAGNOSIS — N184 Chronic kidney disease, stage 4 (severe): Secondary | ICD-10-CM

## 2021-03-30 ENCOUNTER — Other Ambulatory Visit: Payer: Self-pay

## 2021-03-30 DIAGNOSIS — E785 Hyperlipidemia, unspecified: Secondary | ICD-10-CM

## 2021-03-30 DIAGNOSIS — E119 Type 2 diabetes mellitus without complications: Secondary | ICD-10-CM

## 2021-03-30 DIAGNOSIS — I5022 Chronic systolic (congestive) heart failure: Secondary | ICD-10-CM

## 2021-03-30 MED ORDER — CARVEDILOL 25 MG PO TABS: 25.0000 mg | ORAL_TABLET | Freq: Two times a day (BID) | ORAL | 6 refills | Status: DC

## 2021-03-30 MED ORDER — EMPAGLIFLOZIN 10 MG PO TABS
10.0000 mg | ORAL_TABLET | Freq: Every day | ORAL | 3 refills | Status: DC
Start: 2021-03-30 — End: 2021-10-04

## 2021-03-30 MED ORDER — EZETIMIBE 10 MG PO TABS: 10.0000 mg | ORAL_TABLET | Freq: Every day | ORAL | 3 refills | Status: DC

## 2021-03-30 MED ORDER — FUROSEMIDE 80 MG PO TABS: 80.0000 mg | ORAL_TABLET | Freq: Every day | ORAL | 3 refills | Status: DC

## 2021-03-30 MED ORDER — ATORVASTATIN CALCIUM 80 MG PO TABS: 80.0000 mg | ORAL_TABLET | Freq: Every day | ORAL | 3 refills | Status: DC

## 2021-03-30 MED ORDER — SITAGLIPTIN PHOSPHATE 25 MG PO TABS: 25.0000 mg | ORAL_TABLET | Freq: Every day | ORAL | 2 refills | Status: DC

## 2021-04-04 ENCOUNTER — Ambulatory Visit (INDEPENDENT_AMBULATORY_CARE_PROVIDER_SITE_OTHER)
Admission: RE | Admit: 2021-04-04 | Discharge: 2021-04-04 | Disposition: A | Discharge status: Home / Self Care | Payer: Medicare Other | Source: Ambulatory Visit | Attending: Vascular Surgery | Admitting: Vascular Surgery

## 2021-04-04 ENCOUNTER — Other Ambulatory Visit: Payer: Self-pay

## 2021-04-04 ENCOUNTER — Encounter: Payer: Self-pay | Admitting: Vascular Surgery

## 2021-04-04 ENCOUNTER — Ambulatory Visit (HOSPITAL_COMMUNITY)
Admission: RE | Admit: 2021-04-04 | Discharge: 2021-04-04 | Disposition: A | Discharge status: Home / Self Care | Payer: Medicare Other | Source: Ambulatory Visit | Attending: Vascular Surgery | Admitting: Vascular Surgery

## 2021-04-04 ENCOUNTER — Ambulatory Visit (INDEPENDENT_AMBULATORY_CARE_PROVIDER_SITE_OTHER): Payer: Medicare Other | Admitting: Vascular Surgery

## 2021-04-04 VITALS — BP 138/79 | HR 75 | Temp 97.2°F | Resp 16 | Ht 62.0 in | Wt 189.0 lb

## 2021-04-04 DIAGNOSIS — N184 Chronic kidney disease, stage 4 (severe): Secondary | ICD-10-CM | POA: Insufficient documentation

## 2021-04-04 NOTE — Progress Notes (Signed)
Patient name: [name] MRN: [id number] DOB: [birth year] Sex: female  REASON FOR CONSULT: Evaluate for permanent hemodialysis access  HPI: [name] is a 71 y.o. female, with history of heart failure, CAD s/p CABG, hypertension, diabetes, stage IV chronic kidney disease that presents for evaluation of permanent hemodialysis access.  In reviewing the notes from Dr. Royce Macadamia it suggest that her kidney disease is from the chronic combined systolic and diastolic heart failure as well as the insult from hypertension and diabetes.  Patient reports she is right-handed.  She's had no previous upper extremity access.  States she has had heart stents and subsequent CABG.  Not on dialysis at this time.  Past Medical History:  Diagnosis Date   Acute exacerbation of CHF (congestive heart failure) ([place]) 09/12/2017   Acute gallstone pancreatitis    Acute gallstone pancreatitis    Acute on chronic systolic heart failure (HCC)    Bilateral pleural effusion 02/22/2014   Cardiomyopathy, ischemic, EF 25-30% 03/23/2014   03/01/2014- Date of procedure- CABG- X5- Severe 3-vessel coronary artery disease with severe LV dysfunction.  Ejection fraction 25-30%.  SURGICAL PROCEDURE:  Coronary artery bypass grafting x 5. SURGEON:  Lanelle Bal, MD.      Cardiomyopathy, ischemic, EF 25-30% 03/23/2014   03/01/2014- Date of procedure- CABG- X5- Severe 3-vessel coronary artery disease with severe LV dysfunction.  Ejection fraction 25-30%.  SURGICAL PROCEDURE:  Coronary artery bypass grafting x 5. SURGEON:  Lanelle Bal, MD.      Chronic systolic heart failure [facility])    Coronary artery disease involving native coronary artery of native heart without angina pectoris    Diabetes mellitus 1998   Dx in 1998. Microalbuminuria, never on insulin.   Diabetes mellitus type 2, controlled, without complications ([place]) 71/0/6269   Last A1C 6.7 (03/21/17)      On Diet Control Only Last Foot Exam: 03/21/17      Last eye exam: to be  scheduled early 2019    Dyspnea    Essential hypertension 12/05/2005   Lisinopril 40mg  Daily, Coreg 25mg  BID.    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   Viral URI with cough 03/21/2017    Past Surgical History:  Procedure Laterality Date   CHOLECYSTECTOMY N/A 08/06/2016   Procedure: LAPAROSCOPIC CHOLECYSTECTOMY WITH INTRAOPERATIVE CHOLANGIOGRAM;  Surgeon: Georganna Skeans, MD;  Location: [place];  Service: General;  Laterality: N/A;   CORONARY ARTERY BYPASS GRAFT N/A 03/01/2014   Procedure: CORONARY ARTERY BYPASS GRAFTING (CABG) x five, using left internal mammary artery and right leg greater saphenous vein harvested endoscopically;  Surgeon: Grace Isaac, MD;  Location: [place];  Service: Open Heart Surgery;  Laterality: N/A;   INTRAOPERATIVE TRANSESOPHAGEAL ECHOCARDIOGRAM N/A 03/01/2014   Procedure: INTRAOPERATIVE TRANSESOPHAGEAL ECHOCARDIOGRAM;  Surgeon: Grace Isaac, MD;  Location: [place];  Service: Open Heart Surgery;  Laterality: N/A;   LEFT AND RIGHT HEART CATHETERIZATION WITH CORONARY ANGIOGRAM N/A 02/26/2014   Procedure: LEFT AND RIGHT HEART CATHETERIZATION WITH CORONARY ANGIOGRAM;  Surgeon: Sinclair Grooms, MD;  Location: [facility] CATH LAB;  Service: Cardiovascular;  Laterality: N/A;   TOTAL ABDOMINAL HYSTERECTOMY W/ BILATERAL SALPINGOOPHORECTOMY  1996    Family History  Problem Relation Age of Onset   Diabetes Mother        Mother died of MI at age 21   Heart failure Mother    Kidney failure Mother    Heart failure Sister        Died   Heart failure Brother        Died   Diabetes Brother        Died    SOCIAL HISTORY: Social History   Socioeconomic History   Marital status: Widowed     Spouse name: Not on file   Number of children: Not on file   Years of education: Not on file   Highest education level: Not on file  Occupational History   Occupation: Automotive engineer  Tobacco Use   Smoking status: Never   Smokeless tobacco: Never  Vaping Use   Vaping Use: Never used  Substance and Sexual Activity   Alcohol use: No    Alcohol/week: 0.0 standard drinks   Drug use: No   Sexual activity: Not on file  Other Topics Concern   Not on file  Social History Narrative   Married x 42 yrs.  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   Never smoked. Doesn't drink or use drugs.   Does Patient Exercise:  yes - sometimes      To get diabetes supplies for [name] RD, CDE, April 20,2011   Social Determinants of Health   Financial Resource Strain: Not on file  Food Insecurity: Not on file  Transportation Needs: Not on file  Physical Activity: Not on file  Stress: Not on file  Social Connections: Not on file  Intimate Partner Violence: Not on file    No Known Allergies  Current Outpatient Medications  Medication Sig Dispense Refill   aspirin 81 MG tablet Take 1 tablet (81 mg total) by mouth daily.     atorvastatin (LIPITOR) 80 MG tablet Take 1 tablet (80 mg total) by mouth daily. 90 tablet 3   calcium carbonate (OS-CAL - DOSED IN MG OF ELEMENTAL CALCIUM) 1250 (500 Ca) MG tablet Take 1 tablet by mouth daily.     carvedilol (COREG) 25 MG tablet Take 1 tablet (25 mg total) by mouth 2 (two) times daily with a meal. 180 tablet 6   cholecalciferol (VITAMIN D3) 25 MCG (1000 UT) tablet Take 1 tablet by mouth daily.     empagliflozin (JARDIANCE) 10 MG TABS tablet Take 1 tablet (10 mg total) by mouth daily. 90 tablet 3   ENTRESTO 24-26 MG TAKE ONE TABLET BY MOUTH TWICE A DAY 180 tablet 1   ezetimibe (ZETIA) 10 MG tablet Take 1 tablet (10 mg total) by mouth daily. 90 tablet 3   furosemide (LASIX) 80 MG tablet  Take 1 tablet (80 mg total) by mouth daily. 90 tablet 3   sitaGLIPtin (JANUVIA) 25 MG tablet Take 1 tablet (25 mg total) by mouth daily. 90 tablet 2   No current facility-administered medications for this visit.    REVIEW OF SYSTEMS:  [X]  denotes positive finding, [ ]  denotes negative finding Cardiac  Comments:  Chest pain or chest pressure:    Shortness of breath upon exertion:    Short of breath when lying flat:    Irregular heart rhythm:        Vascular    Pain in calf, thigh, or hip brought on by ambulation:  Pain in feet at night that wakes you up from your sleep:     Blood clot in your veins:    Leg swelling:         Pulmonary    Oxygen at home:    Productive cough:     Wheezing:         Neurologic    Sudden weakness in arms or legs:     Sudden numbness in arms or legs:     Sudden onset of difficulty speaking or slurred speech:    Temporary loss of vision in one eye:     Problems with dizziness:         Gastrointestinal    Blood in stool:     Vomited blood:         Genitourinary    Burning when urinating:     Blood in urine:        Psychiatric    Major depression:         Hematologic    Bleeding problems:    Problems with blood clotting too easily:        Skin    Rashes or ulcers:        Constitutional    Fever or chills:      PHYSICAL EXAM: Vitals:   04/04/21 1511  BP: 138/79  Pulse: 75  Resp: 16  Temp: (!) 97.2 F (36.2 C)  TempSrc: Temporal  SpO2: 97%  Weight: 189 lb (85.7 kg)  Height: 5\' 2"  (1.575 m)    GENERAL: The patient is a well-nourished female, in no acute distress. The vital signs are documented above. CARDIAC: There is a regular rate and rhythm.  VASCULAR:  Palpable radial brachial pulses bilateral upper extremities No chest wall implants PULMONARY: No respiratory distress. ABDOMEN: Soft and non-tender. MUSCULOSKELETAL: There are no major deformities or cyanosis. NEUROLOGIC: No focal weakness or paresthesias are  detected. SKIN: There are no ulcers or rashes noted. PSYCHIATRIC: The patient has a normal affect.  DATA:   Vein mapping shows usable cephalic and basilic veins in both upper extremities  Upper extremity arterial duplex shows greater than 50% stenosis of the left ulnar artery distally  Assessment/Plan:  71 year old female with stage IV CKD secondary to CHF as well as hypertension and diabetes that presents for permanent hemodialysis access evaluation.  I discussed access in her left arm given this would be her nondominant arm.  She has good brachial and radial pulse in the left arm.  She also has a nice cephalic and basilic vein in the left arm.  I discussed basic steps of the operation at [facility] on an outpatient basis.  Discussed risk of steal, infection, bleeding, failure to mature, etc.  I offered to get her scheduled today.  Ultimately she wants to wait and will call back to schedule.  She is under the impression from Dr. Royce Macadamia that this does not need to be scheduled at this time.  I gave her the information for our office and she will call back and schedule when she is ready.   Marty Heck, MD [facility] Office: [phone]

## 2021-04-25 ENCOUNTER — Other Ambulatory Visit: Payer: Self-pay

## 2021-04-25 MED ORDER — ENTRESTO 24-26 MG PO TABS: 1.0000 | ORAL_TABLET | Freq: Two times a day (BID) | ORAL | 1 refills | Status: DC

## 2021-05-06 ENCOUNTER — Other Ambulatory Visit: Payer: Self-pay | Admitting: Cardiology

## 2021-05-16 ENCOUNTER — Telehealth: Payer: Self-pay | Admitting: Cardiology

## 2021-05-16 DIAGNOSIS — E119 Type 2 diabetes mellitus without complications: Secondary | ICD-10-CM

## 2021-05-16 MED ORDER — SITAGLIPTIN PHOSPHATE 25 MG PO TABS: 25.0000 mg | ORAL_TABLET | Freq: Every day | ORAL | 2 refills | Status: DC

## 2021-05-16 NOTE — Telephone Encounter (Signed)
Spoke with [facility] pharmacist. The spoke with the patient who reported that her refills ran out of Januvia so she had not been taking it. The patient was confused as to whether or not she should continue. I advised that we still have it on her medication list to continue.  ?

## 2021-05-16 NOTE — Telephone Encounter (Signed)
New Message: ? ? ? ? ?She needs to know if patient is still supposed to be taking Januvia? ?

## 2021-05-29 LAB — HM DIABETES EYE EXAM

## 2021-06-27 ENCOUNTER — Encounter: Payer: Self-pay | Admitting: Dietician

## 2021-06-29 ENCOUNTER — Ambulatory Visit (INDEPENDENT_AMBULATORY_CARE_PROVIDER_SITE_OTHER): Payer: Medicare Other | Admitting: Internal Medicine

## 2021-06-29 VITALS — BP 103/52 | HR 84 | Temp 98.7°F | Wt 189.7 lb

## 2021-06-29 DIAGNOSIS — E119 Type 2 diabetes mellitus without complications: Secondary | ICD-10-CM

## 2021-06-29 DIAGNOSIS — I10 Essential (primary) hypertension: Secondary | ICD-10-CM

## 2021-06-29 DIAGNOSIS — Z1211 Encounter for screening for malignant neoplasm of colon: Secondary | ICD-10-CM

## 2021-06-29 DIAGNOSIS — I130 Hypertensive heart and chronic kidney disease with heart failure and stage 1 through stage 4 chronic kidney disease, or unspecified chronic kidney disease: Secondary | ICD-10-CM

## 2021-06-29 DIAGNOSIS — I2510 Atherosclerotic heart disease of native coronary artery without angina pectoris: Secondary | ICD-10-CM | POA: Diagnosis not present

## 2021-06-29 DIAGNOSIS — I5022 Chronic systolic (congestive) heart failure: Secondary | ICD-10-CM | POA: Diagnosis not present

## 2021-06-29 DIAGNOSIS — E7800 Pure hypercholesterolemia, unspecified: Secondary | ICD-10-CM

## 2021-06-29 DIAGNOSIS — N184 Chronic kidney disease, stage 4 (severe): Secondary | ICD-10-CM

## 2021-06-29 DIAGNOSIS — Z0000 Encounter for general adult medical examination without abnormal findings: Secondary | ICD-10-CM

## 2021-06-29 DIAGNOSIS — E1122 Type 2 diabetes mellitus with diabetic chronic kidney disease: Secondary | ICD-10-CM

## 2021-06-29 LAB — POCT GLYCOSYLATED HEMOGLOBIN (HGB A1C): Hemoglobin A1C: 8.5 % — AB (ref 4.0–5.6)

## 2021-06-29 LAB — GLUCOSE, CAPILLARY: Glucose-Capillary: 153 mg/dL — ABNORMAL HIGH (ref 70–99)

## 2021-06-29 NOTE — Assessment & Plan Note (Signed)
Chronic and stable. She is on Entresto and Coreg for concomitant HF. BP has been on softer side for several office visits, but pt denies dizziness, orthostatic hypotension and falls. She reports appropriate PO intake.   -Continue Coreg 25 mg BID and Entresto 24-26 BID  -Consider dose reduction of Entresto if BP drops further or she has sxs of hypotension.

## 2021-06-29 NOTE — Assessment & Plan Note (Signed)
Referral for GI for colonoscopy placed Food exam done

## 2021-06-29 NOTE — Assessment & Plan Note (Signed)
Continue Atorvastatin and Zetia, last LDL 74 one year ago  -Lipid panel ordered today showing LDL at goal <70.

## 2021-06-29 NOTE — Patient Instructions (Addendum)
Please continue to take your medications   REDUCE the amount of processed foods you eat, as well as the sugary foods, including pies and sodas  Please follow up with your heart doctor and your kidney doctor.  Call [phone] to schedule your hear appointment  Call [phone] to schedule your kidney appointment

## 2021-06-29 NOTE — Assessment & Plan Note (Addendum)
A1c 8.5 today, up from 8.1 about 9 months ago. Diabetes not at goal on current regimen 2/2 dietary indiscretion and poor follow up. Reports good medication compliance. Tolerating medications without adverse effects. Counseled on the benefits of daily exercise, limiting processed foods and high sugar foods, and weight loss. Denies lethargy and changes in sensation. Foot exam unremarkable. Discussed adding insulin to regimen however she would like to proceed with lifestyle modifications at this time rather than adjusting medications. We will plan for 4 week follow up, and repeat A1c in 3 months. Discussed that if A1c not improved or worsened, medication changes would be necessary -- she is agreeable to this.   -Continue renally dosed 25 mg Sitagliptan  -Continue Jardiance 10 mg daily  -Foot exam unremarkable  -Discussed lifestyle modifications -Repeat A1c in 3 months  -Okay to proceed with cataract surgery per opthalmology

## 2021-06-29 NOTE — Progress Notes (Signed)
CC: routine clinic follow up   HPI:  [name] is a 71 y.o. female with a PMHx stated below and presents today for stated above. Please see the Encounters tab for problem-based Assessment & Plan for additional details.   Past Medical History:  Diagnosis Date   Acute exacerbation of CHF (congestive heart failure) ([place]) 09/12/2017   Acute gallstone pancreatitis    Acute gallstone pancreatitis    Acute on chronic systolic heart failure (HCC)    Bilateral pleural effusion 02/22/2014   Cardiomyopathy, ischemic, EF 25-30% 03/23/2014   03/01/2014- Date of procedure- CABG- X5- Severe 3-vessel coronary artery disease with severe LV dysfunction.  Ejection fraction 25-30%.  SURGICAL PROCEDURE:  Coronary artery bypass grafting x 5. SURGEON:  Lanelle Bal, MD.      Cardiomyopathy, ischemic, EF 25-30% 03/23/2014   03/01/2014- Date of procedure- CABG- X5- Severe 3-vessel coronary artery disease with severe LV dysfunction.  Ejection fraction 25-30%.  SURGICAL PROCEDURE:  Coronary artery bypass grafting x 5. SURGEON:  Lanelle Bal, MD.      Chronic systolic heart failure [facility])    Coronary artery disease involving native coronary artery of native heart without angina pectoris    Diabetes mellitus 1998   Dx in 1998. Microalbuminuria, never on insulin.   Diabetes mellitus type 2, controlled, without complications ([place]) 10/01/2353   Last A1C 6.7 (03/21/17)    On Diet Control Only Last Foot Exam: 03/21/17    Last eye exam: to be scheduled early 2019    Dyspnea    Essential hypertension 12/05/2005   Lisinopril 40mg  Daily, Coreg 25mg  BID.    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   Viral URI with cough  03/21/2017    Current Outpatient Medications on File Prior to Visit  Medication Sig Dispense Refill   aspirin 81 MG tablet Take 1 tablet (81 mg total) by mouth daily.     atorvastatin (LIPITOR) 80 MG tablet Take 1 tablet (80 mg total) by mouth daily. 90 tablet 3   calcium carbonate (OS-CAL - DOSED IN MG OF ELEMENTAL CALCIUM) 1250 (500 Ca) MG tablet Take 1 tablet by mouth daily.     carvedilol (COREG) 25 MG tablet TAKE ONE TABLET BY MOUTH TWICE A DAY WITH MEALS 180 tablet 0   cholecalciferol (VITAMIN D3) 25 MCG (1000 UT) tablet Take 1 tablet by mouth daily.     empagliflozin (JARDIANCE) 10 MG TABS tablet Take 1 tablet (10 mg total) by mouth daily. 90 tablet 3   ezetimibe (ZETIA) 10 MG tablet Take 1 tablet (10 mg total) by mouth daily. 90 tablet 3   furosemide (LASIX) 80 MG tablet Take 1 tablet (80 mg total) by mouth daily. 90 tablet 3   sacubitril-valsartan (ENTRESTO) 24-26 MG Take 1 tablet by mouth 2 (two) times daily. 180 tablet 1   sitaGLIPtin (JANUVIA) 25 MG tablet Take 1 tablet (25 mg total) by mouth daily. 90 tablet 2   No current facility-administered medications on file prior to visit.    Family History  Problem Relation Age of Onset   Diabetes Mother        Mother died of MI at  age 57   Heart failure Mother    Kidney failure Mother    Heart failure Sister        Died   Heart failure Brother        Died   Diabetes Brother        Died    Social History   Socioeconomic History   Marital status: Widowed    Spouse name: Not on file   Number of children: Not on file   Years of education: Not on file   Highest education level: Not on file  Occupational History   Occupation: Automotive engineer  Tobacco Use   Smoking status: Never   Smokeless tobacco: Never  Vaping Use   Vaping Use: Never used  Substance and Sexual Activity   Alcohol use: No    Alcohol/week: 0.0 standard drinks   Drug use: No   Sexual activity: Not on file  Other Topics Concern   Not on file   Social History Narrative   Married x 42 yrs.  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   Never smoked. Doesn't drink or use drugs.   Does Patient Exercise:  yes - sometimes      To get diabetes supplies for [name] RD, CDE, April 20,2011   Social Determinants of Health   Financial Resource Strain: Not on file  Food Insecurity: Not on file  Transportation Needs: Not on file  Physical Activity: Not on file  Stress: Not on file  Social Connections: Not on file  Intimate Partner Violence: Not on file    Review of Systems: ROS negative except for what is noted on the assessment and plan.  Vitals:   06/29/21 1423  BP: (!) 103/52  Pulse: 84  Temp: 98.7 F (37.1 C)  TempSrc: Oral  SpO2: 99%  Weight: 189 lb 11.2 oz (86 kg)    Physical Exam: Constitutional: alert, well-appearing, in NAD HENT: normocephalic, atraumatic, mucous membranes moist Cardiovascular: RRR, no m/r/g, 2+ bilateral LE, no LVD Pulmonary/Chest: normal work of breathing on RA, LCTAB Neurological: A&O x 3 and follows commands  Skin: warm and dry   Psych: normal behavior, normal affect    Assessment & Plan:   See Encounters Tab for problem based charting.  Patient discussed with Dr. Marzetta Board, MD  Internal Medicine Resident, PGY-1 [facility]

## 2021-06-29 NOTE — Assessment & Plan Note (Addendum)
HFrEF with EF 30-35% on 2D echo in 05/2020, secondary to CAD. She last saw her cardiologit/Dr Stanford Breed in April. She is taking Entresto, Coreg, Jardiance and Lasix. Today patient reports feeling well.  She denies CP, SHOB, and DOE. Reports stable weight and lower extremity edema. Exam did not show any significant fluid overload except for stable +2 bilateral lower extremity edema. Respiratory status is normal.  No change in her current regimen. Has plans to follow up with Cardiology this month.   -Continue Entresto daily, Coreg 25 mg qd, Jardiance 10 mg qd, and Lasix 80 mg daily  -Follow-up with cardiology as scheduled

## 2021-06-29 NOTE — Assessment & Plan Note (Addendum)
GFR 23 six months ago. Reports following with Dr Foster/Nephrology q6month. She met with [facility] in March for fistula placement, but has not yet scheduled a date. She has plans to follow up with Dr FRoyce Macadamiathis month.   - BMP today - Avoid nephrotoxic medications  - Continue Entresto and Lasix  - Continue SGLT2i for renal protection - Follow up with Nephro as scheduled - Follow up with [facility] per nephro recommendation   Addendum: BMP showing stable GFR, CKD stage 4. Slight bump in Cr and GFR. Pt to follow up with [facility].

## 2021-06-30 LAB — LIPID PANEL
Chol/HDL Ratio: 2.4 ratio (ref 0.0–4.4)
Cholesterol, Total: 144 mg/dL (ref 100–199)
HDL: 59 mg/dL (ref >39)
LDL Chol Calc (NIH): 68 mg/dL (ref 0–99)
Triglycerides: 88 mg/dL (ref 0–149)
VLDL Cholesterol Cal: 17 mg/dL (ref 5–40)

## 2021-06-30 LAB — BMP8+ANION GAP
Anion Gap: 18 mmol/L (ref 10.0–18.0)
BUN/Creatinine Ratio: 21 (ref 12–28)
BUN: 54 mg/dL — ABNORMAL HIGH (ref 8–27)
CO2: 21 mmol/L (ref 20–29)
Calcium: 9.2 mg/dL (ref 8.7–10.3)
Chloride: 99 mmol/L (ref 96–106)
Creatinine, Ser: 2.53 mg/dL — ABNORMAL HIGH (ref 0.57–1.00)
Glucose: 136 mg/dL — ABNORMAL HIGH (ref 70–99)
Potassium: 4 mmol/L (ref 3.5–5.2)
Sodium: 138 mmol/L (ref 134–144)
eGFR: 20 mL/min/{1.73_m2} — ABNORMAL LOW (ref >59)

## 2021-06-30 NOTE — Progress Notes (Signed)
Internal Medicine Clinic Attending  Case discussed with Dr. Patel  At the time of the visit.  We reviewed the resident's history and exam and pertinent patient test results.  I agree with the assessment, diagnosis, and plan of care documented in the resident's note.  

## 2021-07-08 ENCOUNTER — Other Ambulatory Visit: Payer: Self-pay | Admitting: Cardiology

## 2021-07-11 ENCOUNTER — Other Ambulatory Visit: Payer: Self-pay | Admitting: Cardiology

## 2021-08-05 ENCOUNTER — Other Ambulatory Visit: Payer: Self-pay | Admitting: Cardiology

## 2021-08-05 DIAGNOSIS — E785 Hyperlipidemia, unspecified: Secondary | ICD-10-CM

## 2021-08-09 ENCOUNTER — Ambulatory Visit (INDEPENDENT_AMBULATORY_CARE_PROVIDER_SITE_OTHER): Payer: Medicare Other | Admitting: Student

## 2021-08-09 ENCOUNTER — Encounter: Payer: Self-pay | Admitting: Student

## 2021-08-09 DIAGNOSIS — I129 Hypertensive chronic kidney disease with stage 1 through stage 4 chronic kidney disease, or unspecified chronic kidney disease: Secondary | ICD-10-CM

## 2021-08-09 DIAGNOSIS — E1122 Type 2 diabetes mellitus with diabetic chronic kidney disease: Secondary | ICD-10-CM | POA: Diagnosis not present

## 2021-08-09 DIAGNOSIS — I10 Essential (primary) hypertension: Secondary | ICD-10-CM

## 2021-08-09 DIAGNOSIS — N184 Chronic kidney disease, stage 4 (severe): Secondary | ICD-10-CM

## 2021-08-09 NOTE — Assessment & Plan Note (Signed)
Previous A1c was 8.5.  She is on Januvia 25 mg daily and Jardiance 10 mg daily.  Denies any side effects from the medications.  Patient reports drinking regular ginger ale. She has tried to cut back on sweets and carbs. Overall she is feeling well.   Plan -Discussed dietary modifications -Continue Januvia and Jardiance -Repeat A1c in 2 months

## 2021-08-09 NOTE — Assessment & Plan Note (Addendum)
    08/09/2021    3:08 PM 08/09/2021    2:10 PM 08/09/2021    2:05 PM  Vitals with BMI  Weight   190 lbs 13 oz  Systolic 96 96 94  Diastolic 58 55 50  Pulse  78 83    Asymptomatic. Patient denies any dizziness, lightheadedness, headache or SOB. Currently on Coreg 25 mg BID and Entresto 24-26 mg BID for concomitant HF. She took both medications this morning. She reports adequate p.o. intake.   Plan -continue Coreg and Entresto -Advised patient if she develops symptoms of hypotension to contact the clinic

## 2021-08-09 NOTE — Patient Instructions (Addendum)
Thank you, [name] for allowing [place] to provide your care today. Today we discussed:  Diabetes: next visit in 2-3 months for A1C recheck, please try to switch to diet/zero sugar Ginger Ale. I am glad to hear you are making some dietary modifications.  Blood Pressure: If you feel lightheaded or dizzy, then let us know.  I have ordered the following labs for you:  Lab Orders  No laboratory test(s) ordered today     Referrals ordered today:   Referral Orders  No referral(s) requested today     I have ordered the following medication/changed the following medications:   Stop the following medications: There are no discontinued medications.   Start the following medications: No orders of the defined types were placed in this encounter.    Follow up: 2-3 months    Should you have any questions or concerns please call the internal medicine clinic at [phone].    Angelique Blonder, D.O. [place]

## 2021-08-09 NOTE — Progress Notes (Signed)
CC: Diabetes follow-up  HPI:  [name] is a 71 y.o. female living with a history stated below and presents today for diabetes follow-up. Please see problem based assessment and plan for additional details.  Past Medical History:  Diagnosis Date   Acute exacerbation of CHF (congestive heart failure) ([place]) 09/12/2017   Acute gallstone pancreatitis    Acute gallstone pancreatitis    Acute on chronic systolic heart failure (HCC)    Bilateral pleural effusion 02/22/2014   Cardiomyopathy, ischemic, EF 25-30% 03/23/2014   03/01/2014- Date of procedure- CABG- X5- Severe 3-vessel coronary artery disease with severe LV dysfunction.  Ejection fraction 25-30%.  SURGICAL PROCEDURE:  Coronary artery bypass grafting x 5. SURGEON:  Lanelle Bal, MD.      Cardiomyopathy, ischemic, EF 25-30% 03/23/2014   03/01/2014- Date of procedure- CABG- X5- Severe 3-vessel coronary artery disease with severe LV dysfunction.  Ejection fraction 25-30%.  SURGICAL PROCEDURE:  Coronary artery bypass grafting x 5. SURGEON:  Lanelle Bal, MD.      Chronic systolic heart failure [facility])    Coronary artery disease involving native coronary artery of native heart without angina pectoris    Diabetes mellitus 1998   Dx in 1998. Microalbuminuria, never on insulin.   Diabetes mellitus type 2, controlled, without complications ([place]) 71/06/9676   Last A1C 6.7 (03/21/17)    On Diet Control Only Last Foot Exam: 03/21/17    Last eye exam: to be scheduled early 2019    Dyspnea    Essential hypertension 12/05/2005   Lisinopril 40mg  Daily, Coreg 25mg  BID.    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     atorvastatin (LIPITOR) 80 MG tablet TAKE ONE TABLET BY MOUTH DAILY 90 tablet 0   calcium carbonate (OS-CAL - DOSED IN MG OF ELEMENTAL CALCIUM) 1250 (500 Ca) MG tablet Take 1 tablet by mouth daily.     carvedilol (COREG) 25 MG tablet TAKE ONE TABLET BY MOUTH TWICE A DAY WITH MEALS 180 tablet 0   cholecalciferol (VITAMIN D3) 25 MCG (1000 UT) tablet Take 1 tablet by mouth daily.     empagliflozin (JARDIANCE) 10 MG TABS tablet Take 1 tablet (10 mg total) by mouth daily. 90 tablet 3   ENTRESTO 24-26 MG TAKE 1 TABLET BY MOUTH TWICE  DAILY 200 tablet 2   ezetimibe (ZETIA) 10 MG tablet Take 1 tablet (10 mg total) by mouth daily. 90 tablet 3   furosemide (LASIX) 80 MG tablet Take 1 tablet (80 mg total) by mouth daily. 90 tablet 3   sitaGLIPtin (JANUVIA) 25 MG tablet Take 1 tablet (25 mg total) by mouth daily. 90 tablet 2   No current facility-administered medications on file prior to visit.    Review of Systems: ROS negative except for what is noted on the assessment and plan.  Vitals:   08/09/21 1405 08/09/21 1410 08/09/21 1508  BP: (!) 94/50 (!) 96/55 (!) 96/58  Pulse:  83 78   Temp: 98.1 F (36.7 C)    TempSrc: Oral    SpO2: 100%    Weight: 190 lb 12.8 oz (86.5 kg)      Physical Exam Constitutional:      Appearance: Normal appearance.  HENT:     Head: Normocephalic and atraumatic.  Cardiovascular:     Rate and Rhythm: Normal rate and regular rhythm.  Musculoskeletal:     Right lower leg: 1+ Edema present.     Left lower leg: 1+ Edema present.  Skin:    General: Skin is warm and dry.  Neurological:     Mental Status: She is alert and oriented to person, place, and time.  Psychiatric:        Mood and Affect: Mood normal.        Behavior: Behavior normal.      Assessment & Plan:   Essential hypertension     08/09/2021    3:08 PM 08/09/2021    2:10 PM 08/09/2021    2:05 PM  Vitals with BMI  Weight   190 lbs 13 oz  Systolic 96 96 94  Diastolic 58 55 50  Pulse  78 83    Asymptomatic. Patient denies any dizziness, lightheadedness, headache or SOB. Currently on Coreg 25 mg BID and Entresto 24-26 mg BID for concomitant HF. She took both medications this morning. She reports adequate p.o. intake.   Plan -continue Coreg and Entresto -Advised patient if she develops symptoms of hypotension to contact the clinic  Type 2 diabetes mellitus with stage 4 chronic kidney disease, without long-term current use of insulin (HCC) Previous A1c was 8.5.  She is on Januvia 25 mg daily and Jardiance 10 mg daily.  Denies any side effects from the medications.  Patient reports drinking regular ginger ale. She has tried to cut back on sweets and carbs. Overall she is feeling well.   Plan -Discussed dietary modifications -Continue Januvia and Jardiance -Repeat A1c in 2 months   Patient seen with Dr. Alden Hipp, D.O. [facility], PGY-1 Phone: [phone] Date 08/09/2021 Time 6:27 PM

## 2021-08-10 NOTE — Progress Notes (Signed)
Internal Medicine Clinic Attending  I saw and evaluated the patient.  I personally confirmed the key portions of the history and exam documented by the resident  and I reviewed pertinent patient test results.  The assessment, diagnosis, and plan were formulated together and I agree with the documentation in the resident's note.  

## 2021-09-25 NOTE — Progress Notes (Signed)
HPI: FU CAD; previously admitted with CHF; echo 1/16 showed EF 25-30, mild to moderate MR, mild LAE/RAE/RVE; moderately reduced RV function; small pericardial effusion. Carotid dopplers 1/16 showed 1-39 bilateral stenosis. Cath revealed 3 vessel CAD and EF 30-35. Had CABG with LIMA to LAD, seq svg to first and second diagonal, svg to OM, svg to PDA. Patient previously referred to electrophysiology for consideration of ICD but patient declined. Admitted August 2019 with congestive heart failure and diuresed.  Last echocardiogram May 2022 showed ejection fraction 30 to 35%, global hypokinesis, grade 2 diastolic dysfunction, mild left atrial enlargement, mild to moderate mitral regurgitation.  Since last seen, she has dyspnea after walking 1/2 mile.  No orthopnea, PND, chest pain or syncope.  Chronic edema unchanged by her report.  Current Outpatient Medications  Medication Sig Dispense Refill   aspirin 81 MG tablet Take 1 tablet (81 mg total) by mouth daily.     atorvastatin (LIPITOR) 80 MG tablet TAKE ONE TABLET BY MOUTH DAILY 90 tablet 0   calcium carbonate (OS-CAL - DOSED IN MG OF ELEMENTAL CALCIUM) 1250 (500 Ca) MG tablet Take 1 tablet by mouth daily.     carvedilol (COREG) 25 MG tablet TAKE ONE TABLET BY MOUTH TWICE A DAY WITH MEALS 180 tablet 0   cholecalciferol (VITAMIN D3) 25 MCG (1000 UT) tablet Take 1 tablet by mouth daily.     empagliflozin (JARDIANCE) 10 MG TABS tablet Take 1 tablet (10 mg total) by mouth daily. 90 tablet 3   ENTRESTO 24-26 MG TAKE 1 TABLET BY MOUTH TWICE  DAILY 200 tablet 2   ezetimibe (ZETIA) 10 MG tablet Take 1 tablet (10 mg total) by mouth daily. 90 tablet 3   furosemide (LASIX) 80 MG tablet Take 1 tablet (80 mg total) by mouth daily. 90 tablet 3   metolazone (ZAROXOLYN) 5 MG tablet Take 5 mg by mouth as directed.     sitaGLIPtin (JANUVIA) 25 MG tablet Take 1 tablet (25 mg total) by mouth daily. 90 tablet 2   No current facility-administered medications for this  visit.     Past Medical History:  Diagnosis Date   Acute exacerbation of CHF (congestive heart failure) ([place]) 09/12/2017   Acute gallstone pancreatitis    Acute gallstone pancreatitis    Acute on chronic systolic heart failure (HCC)    Bilateral pleural effusion 02/22/2014   Cardiomyopathy, ischemic, EF 25-30% 03/23/2014   03/01/2014- Date of procedure- CABG- X5- Severe 3-vessel coronary artery disease with severe LV dysfunction.  Ejection fraction 25-30%.  SURGICAL PROCEDURE:  Coronary artery bypass grafting x 5. SURGEON:  Lanelle Bal, MD.      Cardiomyopathy, ischemic, EF 25-30% 03/23/2014   03/01/2014- Date of procedure- CABG- X5- Severe 3-vessel coronary artery disease with severe LV dysfunction.  Ejection fraction 25-30%.  SURGICAL PROCEDURE:  Coronary artery bypass grafting x 5. SURGEON:  Lanelle Bal, MD.      Chronic systolic heart failure [facility])    Coronary artery disease involving native coronary artery of native heart without angina pectoris    Diabetes mellitus 1998   Dx in 1998. Microalbuminuria, never on insulin.   Diabetes mellitus type 2, controlled, without complications ([place]) 25/04/2704   Last A1C 6.7 (03/21/17)    On Diet Control Only Last Foot Exam: 03/21/17    Last eye exam: to be scheduled early 2019    Dyspnea    Essential hypertension 12/05/2005   Lisinopril 40mg  Daily, Coreg 25mg  BID.    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   Viral URI with cough 03/21/2017    Past Surgical History:  Procedure Laterality Date   CHOLECYSTECTOMY N/A 08/06/2016   Procedure: LAPAROSCOPIC CHOLECYSTECTOMY WITH INTRAOPERATIVE CHOLANGIOGRAM;  Surgeon: Georganna Skeans, MD;  Location: [place];   Service: General;  Laterality: N/A;   CORONARY ARTERY BYPASS GRAFT N/A 03/01/2014   Procedure: CORONARY ARTERY BYPASS GRAFTING (CABG) x five, using left internal mammary artery and right leg greater saphenous vein harvested endoscopically;  Surgeon: Grace Isaac, MD;  Location: [place];  Service: Open Heart Surgery;  Laterality: N/A;   INTRAOPERATIVE TRANSESOPHAGEAL ECHOCARDIOGRAM N/A 03/01/2014   Procedure: INTRAOPERATIVE TRANSESOPHAGEAL ECHOCARDIOGRAM;  Surgeon: Grace Isaac, MD;  Location: [place];  Service: Open Heart Surgery;  Laterality: N/A;   LEFT AND RIGHT HEART CATHETERIZATION WITH CORONARY ANGIOGRAM N/A 02/26/2014   Procedure: LEFT AND RIGHT HEART CATHETERIZATION WITH CORONARY ANGIOGRAM;  Surgeon: Sinclair Grooms, MD;  Location: [facility] CATH LAB;  Service: Cardiovascular;  Laterality: N/A;   TOTAL ABDOMINAL HYSTERECTOMY W/ BILATERAL SALPINGOOPHORECTOMY  1996    Social History   Socioeconomic History   Marital status: Widowed    Spouse name: Not on file   Number of children: Not on file   Years of education: Not on file   Highest education level: Not on file  Occupational History   Occupation: Automotive engineer  Tobacco Use   Smoking status: Never   Smokeless tobacco: Never  Vaping Use   Vaping Use: Never used  Substance and Sexual Activity   Alcohol use: No    Alcohol/week: 0.0 standard drinks of alcohol   Drug use: No   Sexual activity: Not on file  Other Topics Concern   Not on file  Social History Narrative   Married x 42 yrs.  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   Never smoked. Doesn't drink or use drugs.   Does Patient Exercise:  yes - sometimes      To get diabetes supplies for [name] RD, CDE, April 20,2011   Social Determinants of Health   Financial Resource Strain: Not on file  Food Insecurity: Not on file  Transportation Needs: Not on file  Physical Activity: Not on file  Stress:  Not on file  Social Connections: Not on file  Intimate Partner Violence: Not on file    Family History  Problem Relation Age of Onset   Diabetes Mother        Mother died of MI at age 20   Heart failure Mother    Kidney failure Mother    Heart failure Sister        Died   Heart failure Brother        Died   Diabetes Brother        Died    ROS: no fevers or chills, productive cough, hemoptysis, dysphasia, odynophagia, melena, hematochezia, dysuria, hematuria, rash, seizure activity, orthopnea, PND, claudication. Remaining systems are negative.  Physical Exam: Well-developed well-nourished in no acute distress.  Skin is warm and dry.  HEENT is normal.  Neck is supple.  Chest is clear to auscultation  with normal expansion.  Cardiovascular exam is regular rate and rhythm.  Abdominal exam nontender or distended. No masses palpated. Extremities show 2+ ankle edema. neuro grossly intact  ECG-normal sinus rhythm at a rate of 82, left bundle branch block.  Personally reviewed  A/P  1 chronic systolic congestive heart failure-  2 ischemic cardiomyopathy-plan to continue Entresto and beta-blocker.  She has continued to decline ICD understanding the risk of sudden death.  3 coronary artery disease-continue aspirin and statin.    4 mitral regurgitation-mild to moderate on most recent echocardiogram.  Repeat echo.  5 hypertension-blood pressure controlled.  Continue present medications and follow.  6 hyperlipidemia-continue statin.  Check lipids and liver.  7 chronic stage IV kidney disease-patient is followed by nephrology.  Check potassium and renal function today.  If her renal function worsens may need to discontinue Entresto and Jardiance.  Kirk Ruths, MD

## 2021-10-04 ENCOUNTER — Other Ambulatory Visit: Payer: Self-pay

## 2021-10-04 DIAGNOSIS — E119 Type 2 diabetes mellitus without complications: Secondary | ICD-10-CM

## 2021-10-05 ENCOUNTER — Encounter: Payer: Medicare Other | Admitting: Student

## 2021-10-05 MED ORDER — EMPAGLIFLOZIN 10 MG PO TABS: 10.0000 mg | ORAL_TABLET | Freq: Every day | ORAL | 3 refills | Status: DC

## 2021-10-09 ENCOUNTER — Ambulatory Visit: Payer: Medicare Other | Attending: Cardiology | Admitting: Cardiology

## 2021-10-09 ENCOUNTER — Encounter: Payer: Self-pay | Admitting: Cardiology

## 2021-10-09 VITALS — BP 136/64 | HR 82 | Ht 62.0 in | Wt 188.0 lb

## 2021-10-09 DIAGNOSIS — E7800 Pure hypercholesterolemia, unspecified: Secondary | ICD-10-CM

## 2021-10-09 DIAGNOSIS — I5022 Chronic systolic (congestive) heart failure: Secondary | ICD-10-CM

## 2021-10-09 DIAGNOSIS — I2510 Atherosclerotic heart disease of native coronary artery without angina pectoris: Secondary | ICD-10-CM | POA: Diagnosis not present

## 2021-10-09 DIAGNOSIS — I255 Ischemic cardiomyopathy: Secondary | ICD-10-CM | POA: Diagnosis not present

## 2021-10-09 DIAGNOSIS — I10 Essential (primary) hypertension: Secondary | ICD-10-CM | POA: Diagnosis not present

## 2021-10-09 MED ORDER — ENTRESTO 24-26 MG PO TABS: 1.0000 | ORAL_TABLET | Freq: Two times a day (BID) | ORAL | 3 refills | Status: DC

## 2021-10-09 NOTE — Patient Instructions (Signed)
Your physician has requested that you have an echocardiogram. Echocardiography is a painless test that uses sound waves to create images of your heart. It provides your doctor with information about the size and shape of your heart and how well your heart's chambers and valves are working. This procedure takes approximately one hour. There are no restrictions for this procedure. [place], you and your health needs are our priority.  As part of our continuing mission to provide you with exceptional heart care, we have created designated Provider Care Teams.  These Care Teams include your primary Cardiologist (physician) and Advanced Practice Providers (APPs -  Physician Assistants and Nurse Practitioners) who all work together to provide you with the care you need, when you need it.  We recommend signing up for the patient portal called "MyChart".  Sign up information is provided on this After Visit Summary.  MyChart is used to connect with patients for Virtual Visits (Telemedicine).  Patients are able to view lab/test results, encounter notes, upcoming appointments, etc.  Non-urgent messages can be sent to your provider as well.   To learn more about what you can do with MyChart, go to [email or web address].    Your next appointment:   6 month(s)  The format for your next appointment:   In Person  Provider:   Kirk Ruths MD

## 2021-10-10 ENCOUNTER — Encounter: Payer: Self-pay | Admitting: *Deleted

## 2021-10-10 LAB — LIPID PANEL
Chol/HDL Ratio: 2.6 ratio (ref 0.0–4.4)
Cholesterol, Total: 143 mg/dL (ref 100–199)
HDL: 54 mg/dL (ref >39)
LDL Chol Calc (NIH): 73 mg/dL (ref 0–99)
Triglycerides: 83 mg/dL (ref 0–149)
VLDL Cholesterol Cal: 16 mg/dL (ref 5–40)

## 2021-10-10 LAB — COMPREHENSIVE METABOLIC PANEL
ALT: 11 IU/L (ref 0–32)
AST: 19 IU/L (ref 0–40)
Albumin/Globulin Ratio: 1.3 (ref 1.2–2.2)
Albumin: 4.1 g/dL (ref 3.8–4.8)
Alkaline Phosphatase: 128 IU/L — ABNORMAL HIGH (ref 44–121)
BUN/Creatinine Ratio: 26 (ref 12–28)
BUN: 49 mg/dL — ABNORMAL HIGH (ref 8–27)
Bilirubin Total: 0.3 mg/dL (ref 0.0–1.2)
CO2: 23 mmol/L (ref 20–29)
Calcium: 9.3 mg/dL (ref 8.7–10.3)
Chloride: 103 mmol/L (ref 96–106)
Creatinine, Ser: 1.9 mg/dL — ABNORMAL HIGH (ref 0.57–1.00)
Globulin, Total: 3.1 g/dL (ref 1.5–4.5)
Glucose: 162 mg/dL — ABNORMAL HIGH (ref 70–99)
Potassium: 4.5 mmol/L (ref 3.5–5.2)
Sodium: 142 mmol/L (ref 134–144)
Total Protein: 7.2 g/dL (ref 6.0–8.5)
eGFR: 28 mL/min/{1.73_m2} — ABNORMAL LOW (ref >59)

## 2021-10-10 NOTE — Addendum Note (Signed)
Addended by: Ellwood Handler on: 2/41/1464 02:28 PM   Modules accepted: Orders

## 2021-10-23 ENCOUNTER — Ambulatory Visit (HOSPITAL_COMMUNITY): Payer: Medicare Other | Attending: Cardiology

## 2021-10-23 DIAGNOSIS — I340 Nonrheumatic mitral (valve) insufficiency: Secondary | ICD-10-CM

## 2021-10-23 DIAGNOSIS — I255 Ischemic cardiomyopathy: Secondary | ICD-10-CM | POA: Diagnosis not present

## 2021-10-23 LAB — ECHOCARDIOGRAM COMPLETE
Area-P 1/2: 6.6 cm2
MV M vel: 4.15 m/s
MV Peak grad: 68.9 mmHg
Radius: 0.63 cm
S' Lateral: 3.9 cm

## 2021-10-24 ENCOUNTER — Other Ambulatory Visit: Payer: Self-pay | Admitting: *Deleted

## 2021-10-24 DIAGNOSIS — I255 Ischemic cardiomyopathy: Secondary | ICD-10-CM

## 2021-10-24 MED ORDER — ENTRESTO 24-26 MG PO TABS: 1.0000 | ORAL_TABLET | Freq: Two times a day (BID) | ORAL | 3 refills | Status: DC

## 2021-11-06 ENCOUNTER — Other Ambulatory Visit: Payer: Self-pay

## 2021-11-06 DIAGNOSIS — E119 Type 2 diabetes mellitus without complications: Secondary | ICD-10-CM

## 2021-11-06 MED ORDER — EMPAGLIFLOZIN 10 MG PO TABS: 10.0000 mg | ORAL_TABLET | Freq: Every day | ORAL | 3 refills | Status: DC

## 2021-11-12 ENCOUNTER — Other Ambulatory Visit: Payer: Self-pay | Admitting: Cardiology

## 2021-11-15 ENCOUNTER — Other Ambulatory Visit: Payer: Self-pay

## 2021-11-15 DIAGNOSIS — E785 Hyperlipidemia, unspecified: Secondary | ICD-10-CM

## 2021-11-15 MED ORDER — CARVEDILOL 25 MG PO TABS: 25.0000 mg | ORAL_TABLET | Freq: Two times a day (BID) | ORAL | 0 refills | Status: DC

## 2021-11-15 MED ORDER — ATORVASTATIN CALCIUM 80 MG PO TABS: 80.0000 mg | ORAL_TABLET | Freq: Every day | ORAL | 1 refills | Status: DC

## 2021-12-18 ENCOUNTER — Ambulatory Visit: Payer: Medicare Other

## 2021-12-18 ENCOUNTER — Encounter: Payer: Medicare Other | Admitting: Internal Medicine

## 2021-12-18 ENCOUNTER — Encounter: Payer: Self-pay | Admitting: Internal Medicine

## 2021-12-18 NOTE — Progress Notes (Deleted)
CC: diabetes f/u  HPI:  [name] is a 71 y.o. female living with a history stated below and presents today for a follow up of her diabetes. Please see problem based assessment and plan for additional details.  Past Medical History:  Diagnosis Date   Acute exacerbation of CHF (congestive heart failure) ([place]) 09/12/2017   Acute gallstone pancreatitis    Acute gallstone pancreatitis    Acute on chronic systolic heart failure (HCC)    Bilateral pleural effusion 02/22/2014   Cardiomyopathy, ischemic, EF 25-30% 03/23/2014   03/01/2014- Date of procedure- CABG- X5- Severe 3-vessel coronary artery disease with severe LV dysfunction.  Ejection fraction 25-30%.  SURGICAL PROCEDURE:  Coronary artery bypass grafting x 5. SURGEON:  Lanelle Bal, MD.      Cardiomyopathy, ischemic, EF 25-30% 03/23/2014   03/01/2014- Date of procedure- CABG- X5- Severe 3-vessel coronary artery disease with severe LV dysfunction.  Ejection fraction 25-30%.  SURGICAL PROCEDURE:  Coronary artery bypass grafting x 5. SURGEON:  Lanelle Bal, MD.      Chronic systolic heart failure [facility])    Coronary artery disease involving native coronary artery of native heart without angina pectoris    Diabetes mellitus 1998   Dx in 1998. Microalbuminuria, never on insulin.   Diabetes mellitus type 2, controlled, without complications ([place]) 90/03/90   Last A1C 6.7 (03/21/17)    On Diet Control Only Last Foot Exam: 03/21/17    Last eye exam: to be scheduled early 2019    Dyspnea    Essential hypertension 12/05/2005   Lisinopril 40mg  Daily, Coreg 25mg  BID.    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     atorvastatin (LIPITOR) 80 MG tablet Take 1 tablet (80 mg total) by mouth daily. 90 tablet 1   calcium carbonate (OS-CAL - DOSED IN MG OF ELEMENTAL CALCIUM) 1250 (500 Ca) MG tablet Take 1 tablet by mouth daily.     carvedilol (COREG) 25 MG tablet Take 1 tablet (25 mg total) by mouth 2 (two) times daily with a meal. 180 tablet 0   cholecalciferol (VITAMIN D3) 25 MCG (1000 UT) tablet Take 1 tablet by mouth daily.     empagliflozin (JARDIANCE) 10 MG TABS tablet Take 1 tablet (10 mg total) by mouth daily. 90 tablet 3   ezetimibe (ZETIA) 10 MG tablet TAKE ONE TABLET BY MOUTH DAILY 90 tablet 3   furosemide (LASIX) 80 MG tablet Take 1 tablet (80 mg total) by mouth daily. 90 tablet 3   metolazone (ZAROXOLYN) 5 MG tablet Take 5 mg by mouth as directed.     sacubitril-valsartan (ENTRESTO) 24-26 MG Take 1 tablet by mouth 2 (two) times daily. 180 tablet 3   sitaGLIPtin (JANUVIA) 25 MG tablet Take 1 tablet (25 mg total) by mouth daily. 90 tablet 2   No current facility-administered medications on file prior to visit.    Family History  Problem Relation Age of Onset  Diabetes Mother        Mother died of MI at age 8   Heart failure Mother    Kidney failure Mother    Heart failure Sister        Died   Heart failure Brother        Died   Diabetes Brother        Died    Social History   Socioeconomic History   Marital status: Widowed    Spouse name: Not on file   Number of children: Not on file   Years of education: Not on file   Highest education level: Not on file  Occupational History   Occupation: Automotive engineer  Tobacco Use   Smoking status: Never   Smokeless tobacco: Never  Vaping Use   Vaping Use: Never used  Substance and Sexual Activity   Alcohol use: No    Alcohol/week: 0.0 standard drinks of alcohol    Drug use: No   Sexual activity: Not on file  Other Topics Concern   Not on file  Social History Narrative   Married x 42 yrs.  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   Never smoked. Doesn't drink or use drugs.   Does Patient Exercise:  yes - sometimes      To get diabetes supplies for [name] RD, CDE, April 20,2011   Social Determinants of Health   Financial Resource Strain: Not on file  Food Insecurity: Not on file  Transportation Needs: Not on file  Physical Activity: Not on file  Stress: Not on file  Social Connections: Not on file  Intimate Partner Violence: Not on file    Review of Systems: ROS negative except for what is noted on the assessment and plan.  There were no vitals filed for this visit.  Physical Exam: Constitutional: well-appearing *** sitting in ***, in no acute distress HENT: normocephalic atraumatic, mucous membranes moist Eyes: conjunctiva non-erythematous Cardiovascular: regular rate and rhythm, no m/r/g Pulmonary/Chest: normal work of breathing on room air, lungs clear to auscultation bilaterally Abdominal: soft, non-tender, non-distended MSK: normal bulk and tone Neurological: alert & oriented x 3, no focal deficit Skin: warm and dry Psych: normal mood and behavior  Assessment & Plan:     Patient {GC/GE:[id number]::"discussed with","seen with"} Dr. {BOFBP:[id number]::"DPOEUMPN","T. Hoffman","Mullen","Narendra","Vincent","Guilloud","Lau","Machen"}  No problem-specific Assessment & Plan notes found for this encounter.   Buddy Duty, D.O. [facility], PGY-2 Phone: [phone] Date 12/18/2021 Time 8:23 AM

## 2022-01-10 ENCOUNTER — Ambulatory Visit (INDEPENDENT_AMBULATORY_CARE_PROVIDER_SITE_OTHER): Payer: Medicare Other | Admitting: Internal Medicine

## 2022-01-10 ENCOUNTER — Encounter: Payer: Self-pay | Admitting: Internal Medicine

## 2022-01-10 ENCOUNTER — Other Ambulatory Visit: Payer: Self-pay

## 2022-01-10 VITALS — BP 135/68 | HR 94 | Temp 97.7°F | Ht 62.0 in | Wt 184.1 lb

## 2022-01-10 DIAGNOSIS — N184 Chronic kidney disease, stage 4 (severe): Secondary | ICD-10-CM | POA: Diagnosis not present

## 2022-01-10 DIAGNOSIS — Z23 Encounter for immunization: Secondary | ICD-10-CM | POA: Diagnosis not present

## 2022-01-10 DIAGNOSIS — Z1231 Encounter for screening mammogram for malignant neoplasm of breast: Secondary | ICD-10-CM

## 2022-01-10 DIAGNOSIS — Z0000 Encounter for general adult medical examination without abnormal findings: Secondary | ICD-10-CM

## 2022-01-10 DIAGNOSIS — Z7984 Long term (current) use of oral hypoglycemic drugs: Secondary | ICD-10-CM | POA: Diagnosis not present

## 2022-01-10 DIAGNOSIS — E1122 Type 2 diabetes mellitus with diabetic chronic kidney disease: Secondary | ICD-10-CM

## 2022-01-10 DIAGNOSIS — E119 Type 2 diabetes mellitus without complications: Secondary | ICD-10-CM

## 2022-01-10 LAB — POCT GLYCOSYLATED HEMOGLOBIN (HGB A1C): Hemoglobin A1C: 11.1 % — AB (ref 4.0–5.6)

## 2022-01-10 LAB — GLUCOSE, CAPILLARY: Glucose-Capillary: 261 mg/dL — ABNORMAL HIGH (ref 70–99)

## 2022-01-10 MED ORDER — EMPAGLIFLOZIN 25 MG PO TABS: 25.0000 mg | ORAL_TABLET | Freq: Every day | ORAL | 3 refills | Status: DC

## 2022-01-10 MED ORDER — SEMAGLUTIDE(0.25 OR 0.5MG/DOS) 2 MG/3ML ~~LOC~~ SOPN: PEN_INJECTOR | SUBCUTANEOUS | 2 refills | Status: AC

## 2022-01-10 NOTE — Assessment & Plan Note (Signed)
-   Flu shot given today - Order for mammogram placed

## 2022-01-10 NOTE — Assessment & Plan Note (Addendum)
The patient presents to the [facility] for a follow-up of her diabetes.  Previous A1c was 8.5% and increased to 11.1% today. The patient states that she has been out of her Januvia for over a month and her diet has not been the best (high in carbs and sugars).  She is also on Jardiance 10 mg daily, but has been taking this regularly.  She denies any polyuria or polydipsia, and states that she feels well overall.  Plan: -Foot exam performed today -Urine micro today -Increase Jardiance to 25 mg daily -Discontinue Januvia -Start Ozempic 0.25 mg/week x 4 weeks followed by 0.5 mg/week -Counseled on lifestyle modifications -Follow-up in 3 months

## 2022-01-10 NOTE — Patient Instructions (Signed)
Thank you, Ms.Sarrah L Torosyan for allowing [place] to provide your care today. Today we discussed:  Diabetes: Take jardiance 25 mg daily (higher dose) and start ozempic 0.25 mg once a week. If you do well with this dose, I have also sent the higher dose 0.5 mg once a week to your pharmacy  Flu shot given today Mammogram ordered! Please call to schedule  I have ordered the following labs for you:   Lab Orders         Glucose, capillary         Microalbumin / Creatinine Urine Ratio         POC Hbg A1C       Referrals ordered today:   Referral Orders  No referral(s) requested today     I have ordered the following medication/changed the following medications:   Stop the following medications: Medications Discontinued During This Encounter  Medication Reason   sitaGLIPtin (JANUVIA) 25 MG tablet    empagliflozin (JARDIANCE) 10 MG TABS tablet Reorder     Start the following medications: Meds ordered this encounter  Medications   empagliflozin (JARDIANCE) 25 MG TABS tablet    Sig: Take 1 tablet (25 mg total) by mouth daily.    Dispense:  30 tablet    Refill:  3   Semaglutide,0.25 or 0.5MG /DOS, 2 MG/3ML SOPN    Sig: Inject 0.25 mg into the skin once a week for 28 days, THEN 0.5 mg once a week for 28 days.    Dispense:  3 mL    Refill:  2     Follow up: 3 months    Should you have any questions or concerns please call the internal medicine clinic at [phone].     Buddy Duty, D.O. [place]

## 2022-01-10 NOTE — Progress Notes (Signed)
CC: diabetes  HPI:  Ms.Cabela L Starrett is a 71 y.o. female living with a history stated below and presents today for a follow-up of diabetes. Please see problem based assessment and plan for additional details.  Past Medical History:  Diagnosis Date   Acute exacerbation of CHF (congestive heart failure) ([place]) 09/12/2017   Acute gallstone pancreatitis    Acute gallstone pancreatitis    Acute on chronic systolic heart failure (HCC)    Bilateral pleural effusion 02/22/2014   Cardiomyopathy, ischemic, EF 25-30% 03/23/2014   03/01/2014- Date of procedure- CABG- X5- Severe 3-vessel coronary artery disease with severe LV dysfunction.  Ejection fraction 25-30%.  SURGICAL PROCEDURE:  Coronary artery bypass grafting x 5. SURGEON:  Lanelle Bal, MD.      Cardiomyopathy, ischemic, EF 25-30% 03/23/2014   03/01/2014- Date of procedure- CABG- X5- Severe 3-vessel coronary artery disease with severe LV dysfunction.  Ejection fraction 25-30%.  SURGICAL PROCEDURE:  Coronary artery bypass grafting x 5. SURGEON:  Lanelle Bal, MD.      Chronic systolic heart failure [facility])    Coronary artery disease involving native coronary artery of native heart without angina pectoris    Diabetes mellitus 1998   Dx in 1998. Microalbuminuria, never on insulin.   Diabetes mellitus type 2, controlled, without complications ([place]) 16/0/1093   Last A1C 6.7 (03/21/17)    On Diet Control Only Last Foot Exam: 03/21/17    Last eye exam: to be scheduled early 2019    Dyspnea    Essential hypertension 12/05/2005   Lisinopril 40mg  Daily, Coreg 25mg  BID.    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     atorvastatin (LIPITOR) 80 MG tablet Take 1 tablet (80 mg total) by mouth daily. 90 tablet 1   calcium carbonate (OS-CAL - DOSED IN MG OF ELEMENTAL CALCIUM) 1250 (500 Ca) MG tablet Take 1 tablet by mouth daily.     carvedilol (COREG) 25 MG tablet Take 1 tablet (25 mg total) by mouth 2 (two) times daily with a meal. 180 tablet 0   cholecalciferol (VITAMIN D3) 25 MCG (1000 UT) tablet Take 1 tablet by mouth daily.     ezetimibe (ZETIA) 10 MG tablet TAKE ONE TABLET BY MOUTH DAILY 90 tablet 3   furosemide (LASIX) 80 MG tablet Take 1 tablet (80 mg total) by mouth daily. 90 tablet 3   metolazone (ZAROXOLYN) 5 MG tablet Take 5 mg by mouth as directed.     sacubitril-valsartan (ENTRESTO) 24-26 MG Take 1 tablet by mouth 2 (two) times daily. 180 tablet 3   No current facility-administered medications on file prior to visit.    Family History  Problem Relation Age of Onset   Diabetes Mother        Mother died of MI at age 34   Heart failure Mother    Kidney failure Mother    Heart failure Sister  Died   Heart failure Brother        Died   Diabetes Brother        Died    Social History   Socioeconomic History   Marital status: Widowed    Spouse name: Not on file   Number of children: Not on file   Years of education: Not on file   Highest education level: Not on file  Occupational History   Occupation: Automotive engineer  Tobacco Use   Smoking status: Never   Smokeless tobacco: Never  Vaping Use   Vaping Use: Never used  Substance and Sexual Activity   Alcohol use: No    Alcohol/week: 0.0 standard drinks of alcohol   Drug use: No   Sexual activity: Not on file  Other Topics Concern   Not on file  Social History Narrative   Married x 42 yrs.  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   Never smoked. Doesn't drink or use drugs.   Does Patient Exercise:  yes - sometimes      To get diabetes supplies for [name] RD, CDE, April 20,2011   Social Determinants of Health   Financial Resource Strain: Not on file  Food Insecurity: No Food Insecurity (01/10/2022)   Hunger Vital Sign    Worried About Running Out of Food in the Last Year: Never true    Ran Out of Food in the Last Year: Never true  Transportation Needs: No Transportation Needs (01/10/2022)   PRAPARE - Hydrologist (Medical): No    Lack of Transportation (Non-Medical): No  Physical Activity: Inactive (01/10/2022)   Exercise Vital Sign    Days of Exercise per Week: 0 days    Minutes of Exercise per Session: 0 min  Stress: No Stress Concern Present (01/10/2022)   [place]    Feeling of Stress : Only a little  Social Connections: Socially Isolated (01/10/2022)   Social Connection and Isolation Panel [NHANES]    Frequency of Communication with Friends and Family: More than three times a week    Frequency of Social Gatherings with Friends and Family: More than three times a week    Attends Religious Services: Never    Marine scientist or Organizations: No    Attends Archivist Meetings: Never    Marital Status: Widowed  Intimate Partner Violence: Not At Risk (01/10/2022)   Humiliation, Afraid, Rape, and Kick questionnaire    Fear of Current or Ex-Partner: No    Emotionally Abused: No    Physically Abused: No    Sexually Abused: No    Review of Systems: ROS negative except for what is noted on the assessment and plan.  Vitals:   01/10/22 1509  BP: 135/68  Pulse: 94  Temp: 97.7 F (36.5 C)  TempSrc: Oral  SpO2: 100%  Weight: 184 lb 1.6 oz (83.5 kg)  Height: 5\' 2"  (1.575 m)    Physical Exam: Constitutional: well-appearing elderly female sitting in chair,  in no acute distress Cardiovascular: regular rate and rhythm, no m/r/g Pulmonary/Chest: normal work of breathing on room air MSK: normal bulk and tone Neurological: alert & oriented x 3, no focal deficit Skin: warm and dry Psych: normal mood and behavior  Assessment & Plan:   Patient discussed with Dr. Dareen Piano  Type 2 diabetes mellitus with stage 4 chronic kidney disease, without long-term  current use of insulin ([place]) The patient presents to the [facility] for a follow-up of her diabetes.  Previous A1c was 8.5% and increased to 11.1% today. The patient states that she has been out of her Januvia for over a month and her diet has not been the best (high in carbs and sugars).  She is also on Jardiance 10 mg daily, but has been taking this regularly.  She denies any polyuria or polydipsia, and states that she feels well overall.  Plan: -Foot exam performed today -Urine micro today -Increase Jardiance to 25 mg daily -Discontinue Januvia -Start Ozempic 0.25 mg/week x 4 weeks followed by 0.5 mg/week -Counseled on lifestyle modifications -Follow-up in 3 months  Preventative health care - Flu shot given today - Order for mammogram placed   Keyshaun Exley, D.O. [facility], PGY-2 Phone: [phone] Date 01/10/2022 Time 4:03 PM

## 2022-01-11 LAB — MICROALBUMIN / CREATININE URINE RATIO
Creatinine, Urine: 165.1 mg/dL
Microalb/Creat Ratio: 39 mg/g creat — ABNORMAL HIGH (ref 0–29)
Microalbumin, Urine: 64.3 ug/mL

## 2022-01-11 NOTE — Progress Notes (Signed)
Urine micro/cr ratio slightly increased to 39, however, this is significantly improved (was previously >1000). SGLT2i increased yesterday at office visit.

## 2022-01-11 NOTE — Progress Notes (Signed)
Internal Medicine Clinic Attending ° °Case discussed with Dr. Atway  At the time of the visit.  We reviewed the resident’s history and exam and pertinent patient test results.  I agree with the assessment, diagnosis, and plan of care documented in the resident’s note.  °

## 2022-01-16 ENCOUNTER — Other Ambulatory Visit: Payer: Self-pay | Admitting: Internal Medicine

## 2022-01-16 DIAGNOSIS — Z23 Encounter for immunization: Secondary | ICD-10-CM

## 2022-01-16 DIAGNOSIS — Z0000 Encounter for general adult medical examination without abnormal findings: Secondary | ICD-10-CM

## 2022-01-16 DIAGNOSIS — E119 Type 2 diabetes mellitus without complications: Secondary | ICD-10-CM

## 2022-01-16 DIAGNOSIS — Z1231 Encounter for screening mammogram for malignant neoplasm of breast: Secondary | ICD-10-CM

## 2022-01-16 DIAGNOSIS — E1122 Type 2 diabetes mellitus with diabetic chronic kidney disease: Secondary | ICD-10-CM

## 2022-01-18 ENCOUNTER — Ambulatory Visit
Admission: RE | Admit: 2022-01-18 | Discharge: 2022-01-18 | Disposition: A | Discharge status: Home / Self Care | Payer: Medicare Other | Source: Ambulatory Visit | Attending: Internal Medicine | Admitting: Internal Medicine

## 2022-01-18 DIAGNOSIS — Z1231 Encounter for screening mammogram for malignant neoplasm of breast: Secondary | ICD-10-CM

## 2022-03-14 ENCOUNTER — Telehealth: Payer: Self-pay

## 2022-03-14 NOTE — Telephone Encounter (Signed)
Semaglutide,0.25 or 0.5MG/DOS, 2 MG/3ML SOPN   Unable to attach medication to message.

## 2022-03-15 MED ORDER — SEMAGLUTIDE(0.25 OR 0.5MG/DOS) 2 MG/3ML ~~LOC~~ SOPN: 0.5000 mg | PEN_INJECTOR | SUBCUTANEOUS | 2 refills | Status: DC

## 2022-03-15 NOTE — Telephone Encounter (Signed)
Patient requesting refill for semaglutide

## 2022-03-21 ENCOUNTER — Other Ambulatory Visit: Payer: Self-pay | Admitting: Cardiology

## 2022-03-22 ENCOUNTER — Telehealth: Payer: Self-pay | Admitting: *Deleted

## 2022-03-22 NOTE — Telephone Encounter (Signed)
   Name: [name]  DOB: 10-15-1950  MRN: [id number]  Primary Cardiologist: Kirk Ruths, MD  Chart reviewed as part of pre-operative protocol coverage. The patient has an upcoming visit scheduled with Dr. Stanford Breed on 04/09/2022 at which time clearance can be addressed in case there are any issues that would impact surgical recommendations.  Colonoscopy is not scheduled until 05/07/2022 as below. I added preop FYI to appointment note so that provider is aware to address at time of outpatient visit.  Per office protocol the cardiology provider should forward their finalized clearance decision and recommendations regarding antiplatelet therapy to the requesting party below.    I will route this message as FYI to requesting party and remove this message from the preop box as separate preop APP input not needed at this time.   Please call with any questions.  Lenna Sciara, NP  03/22/2022, 3:16 PM

## 2022-03-22 NOTE — Telephone Encounter (Signed)
   Pre-operative Risk Assessment    Patient Name: [name]  DOB: [birth year] MRN: [id number]      Request for Surgical Clearance    Procedure:   COLONOSCOPY  Date of Surgery:  Clearance 05/07/22                                 Surgeon:  DR. MARC MAGOD Surgeon's Group or Practice Name:  [facility] Phone number:  [phone] Fax number:  [phone]   Type of Clearance Requested:   - Medical  - Pharmacy:  Hold Aspirin NOT INDICATED HOW LONG   Type of Anesthesia:   PROPOFOL   Additional requests/questions:    Astrid Divine   03/22/2022, 3:07 PM

## 2022-03-28 ENCOUNTER — Other Ambulatory Visit: Payer: Self-pay | Admitting: Internal Medicine

## 2022-03-28 ENCOUNTER — Telehealth: Payer: Self-pay

## 2022-03-28 MED ORDER — PEN NEEDLES 32G X 4 MM MISC: 1.0000 | 1 refills | Status: DC

## 2022-03-28 NOTE — Telephone Encounter (Signed)
Requesting needles for the ozempic. Pt would like this to be sent to the pharmacy @ [facility].

## 2022-04-02 NOTE — Progress Notes (Signed)
HPI: FU CAD; previously admitted with CHF; echo 1/16 showed EF 25-30, mild to moderate MR, mild LAE/RAE/RVE; moderately reduced RV function; small pericardial effusion. Carotid dopplers 1/16 showed 1-39 bilateral stenosis. Cath revealed 3 vessel CAD and EF 30-35. Had CABG with LIMA to LAD, seq svg to first and second diagonal, svg to OM, svg to PDA. Patient previously referred to electrophysiology for consideration of ICD but patient declined. Admitted August 2019 with congestive heart failure and diuresed.  Last echocardiogram May 2023 showed severe LV dysfunction with ejection fraction 25 to 30%, severe mitral regurgitation (MR worse on the study compared to previous).  Since last seen, patient denies chest pain, palpitations or syncope.  She has chronic mild pedal edema.  She has some dyspnea with more vigorous activities but not routine activities.  Current Outpatient Medications  Medication Sig Dispense Refill   aspirin 81 MG tablet Take 1 tablet (81 mg total) by mouth daily.     atorvastatin (LIPITOR) 80 MG tablet Take 1 tablet (80 mg total) by mouth daily. 90 tablet 1   calcium carbonate (OS-CAL - DOSED IN MG OF ELEMENTAL CALCIUM) 1250 (500 Ca) MG tablet Take 1 tablet by mouth daily.     carvedilol (COREG) 25 MG tablet TAKE 1 TABLET BY MOUTH TWICE A DAY WITH A MEAL 180 tablet 1   cholecalciferol (VITAMIN D3) 25 MCG (1000 UT) tablet Take 1 tablet by mouth daily.     empagliflozin (JARDIANCE) 25 MG TABS tablet Take 1 tablet (25 mg total) by mouth daily. 30 tablet 3   ezetimibe (ZETIA) 10 MG tablet TAKE ONE TABLET BY MOUTH DAILY 90 tablet 3   furosemide (LASIX) 80 MG tablet Take 1 tablet (80 mg total) by mouth daily. 90 tablet 3   Insulin Pen Needle (PEN NEEDLES) 32G X 4 MM MISC 1 each by Does not apply route as directed. 100 each 1   metolazone (ZAROXOLYN) 5 MG tablet Take 5 mg by mouth as directed.     sacubitril-valsartan (ENTRESTO) 24-26 MG Take 1 tablet by mouth 2 (two) times daily.  180 tablet 3   Semaglutide,0.25 or 0.'5MG'$ /DOS, 2 MG/3ML SOPN Inject 0.5 mg into the skin once a week. 3 mL 2   No current facility-administered medications for this visit.     Past Medical History:  Diagnosis Date   Acute exacerbation of CHF (congestive heart failure) ([place]) 09/12/2017   Acute gallstone pancreatitis    Acute gallstone pancreatitis    Acute on chronic systolic heart failure (HCC)    Bilateral pleural effusion 02/22/2014   Cardiomyopathy, ischemic, EF 25-30% 03/23/2014   03/01/2014- Date of procedure- CABG- X5- Severe 3-vessel coronary artery disease with severe LV dysfunction.  Ejection fraction 25-30%.  SURGICAL PROCEDURE:  Coronary artery bypass grafting x 5. SURGEON:  Lanelle Bal, MD.      Cardiomyopathy, ischemic, EF 25-30% 03/23/2014   03/01/2014- Date of procedure- CABG- X5- Severe 3-vessel coronary artery disease with severe LV dysfunction.  Ejection fraction 25-30%.  SURGICAL PROCEDURE:  Coronary artery bypass grafting x 5. SURGEON:  Lanelle Bal, MD.      Chronic systolic heart failure [facility])    Coronary artery disease involving native coronary artery of native heart without angina pectoris    Diabetes mellitus 1998   Dx in 1998. Microalbuminuria, never on insulin.   Diabetes mellitus type 2, controlled, without complications ([place]) 123456   Last A1C 6.7 (03/21/17)    On Diet Control Only Last Foot Exam: 03/21/17  Last eye exam: to be scheduled early 2019    Dyspnea    Essential hypertension 12/05/2005   Lisinopril '40mg'$  Daily, Coreg '25mg'$  BID.    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   Viral URI with cough 03/21/2017    Past Surgical History:  Procedure Laterality Date    CHOLECYSTECTOMY N/A 08/06/2016   Procedure: LAPAROSCOPIC CHOLECYSTECTOMY WITH INTRAOPERATIVE CHOLANGIOGRAM;  Surgeon: Georganna Skeans, MD;  Location: [facility];  Service: General;  Laterality: N/A;   CORONARY ARTERY BYPASS GRAFT N/A 03/01/2014   Procedure: CORONARY ARTERY BYPASS GRAFTING (CABG) x five, using left internal mammary artery and right leg greater saphenous vein harvested endoscopically;  Surgeon: Grace Isaac, MD;  Location: [facility];  Service: Open Heart Surgery;  Laterality: N/A;   INTRAOPERATIVE TRANSESOPHAGEAL ECHOCARDIOGRAM N/A 03/01/2014   Procedure: INTRAOPERATIVE TRANSESOPHAGEAL ECHOCARDIOGRAM;  Surgeon: Grace Isaac, MD;  Location: [facility];  Service: Open Heart Surgery;  Laterality: N/A;   LEFT AND RIGHT HEART CATHETERIZATION WITH CORONARY ANGIOGRAM N/A 02/26/2014   Procedure: LEFT AND RIGHT HEART CATHETERIZATION WITH CORONARY ANGIOGRAM;  Surgeon: Sinclair Grooms, MD;  Location: [facility] CATH LAB;  Service: Cardiovascular;  Laterality: N/A;   TOTAL ABDOMINAL HYSTERECTOMY W/ BILATERAL SALPINGOOPHORECTOMY  1996    Social History   Socioeconomic History   Marital status: Widowed    Spouse name: Not on file   Number of children: Not on file   Years of education: Not on file   Highest education level: Not on file  Occupational History   Occupation: Automotive engineer  Tobacco Use   Smoking status: Never   Smokeless tobacco: Never  Vaping Use   Vaping Use: Never used  Substance and Sexual Activity   Alcohol use: No    Alcohol/week: 0.0 standard drinks of alcohol   Drug use: No   Sexual activity: Not on file  Other Topics Concern   Not on file  Social History Narrative   Married x 42 yrs.  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   Never smoked. Doesn't drink or use drugs.   Does Patient Exercise:  yes - sometimes      To get diabetes supplies for [name] RD, CDE, April 20,2011   Social Determinants of  Health   Financial Resource Strain: Not on file  Food Insecurity: No Food Insecurity (01/10/2022)   Hunger Vital Sign    Worried About Running Out of Food in the Last Year: Never true    Ran Out of Food in the Last Year: Never true  Transportation Needs: No Transportation Needs (01/10/2022)   PRAPARE - Hydrologist (Medical): No    Lack of Transportation (Non-Medical): No  Physical Activity: Inactive (01/10/2022)   Exercise Vital Sign    Days of Exercise per Week: 0 days    Minutes of Exercise per Session: 0 min  Stress: No Stress Concern Present (01/10/2022)   [place]    Feeling of Stress : Only a little  Social Connections: Socially Isolated (01/10/2022)   Social Connection and Isolation  Panel [NHANES]    Frequency of Communication with Friends and Family: More than three times a week    Frequency of Social Gatherings with Friends and Family: More than three times a week    Attends Religious Services: Never    Marine scientist or Organizations: No    Attends Archivist Meetings: Never    Marital Status: Widowed  Intimate Partner Violence: Not At Risk (01/10/2022)   Humiliation, Afraid, Rape, and Kick questionnaire    Fear of Current or Ex-Partner: No    Emotionally Abused: No    Physically Abused: No    Sexually Abused: No    Family History  Problem Relation Age of Onset   Diabetes Mother        Mother died of MI at age 26   Heart failure Mother    Kidney failure Mother    Heart failure Sister        Died   Heart failure Brother        Died   Diabetes Brother        Died   Breast cancer Neg Hx     ROS: no fevers or chills, productive cough, hemoptysis, dysphasia, odynophagia, melena, hematochezia, dysuria, hematuria, rash, seizure activity, orthopnea, PND, pedal edema, claudication. Remaining systems are negative.  Physical Exam: Well-developed  well-nourished in no acute distress.  Skin is warm and dry.  HEENT is normal.  Neck is supple.  Chest is clear to auscultation with normal expansion.  Cardiovascular exam is regular rate and rhythm.  Abdominal exam nontender or distended. No masses palpated. Extremities show !+ ankle edema. neuro grossly intact  ECG-normal sinus rhythm at a rate of 82, left bundle branch block.  Personally reviewed  A/P  1 chronic systolic congestive heart failure-patient appears to be euvolemic on examination.  Continue diuretics at present dose.  Continue Jardiance.  I have not added spironolactone due to baseline renal insufficiency.  Check potassium and renal function.  2 ischemic cardiomyopathy-continue Entresto and beta-blocker.  She has continued to decline ICD understanding the risk of sudden death.  3 coronary artery disease-patient denies chest pain.  Continue aspirin and statin.  4 hyperlipidemia-continue statin.  Check lipids and liver.  5 hypertension-blood pressure controlled.  Continue present medications and follow-up.  6-mitral regurgitation-severe on most recent echocardiogram.  Will repeat echocardiogram in May.  7 chronic stage IV kidney disease-managed by nephrology.  Kirk Ruths, MD

## 2022-04-05 ENCOUNTER — Other Ambulatory Visit: Payer: Self-pay | Admitting: Gastroenterology

## 2022-04-09 ENCOUNTER — Ambulatory Visit: Payer: 59 | Attending: Cardiology | Admitting: Cardiology

## 2022-04-09 ENCOUNTER — Encounter: Payer: Self-pay | Admitting: Cardiology

## 2022-04-09 VITALS — BP 92/50 | HR 82 | Ht 62.0 in | Wt 176.6 lb

## 2022-04-09 DIAGNOSIS — N184 Chronic kidney disease, stage 4 (severe): Secondary | ICD-10-CM

## 2022-04-09 DIAGNOSIS — I255 Ischemic cardiomyopathy: Secondary | ICD-10-CM | POA: Diagnosis not present

## 2022-04-09 DIAGNOSIS — E7800 Pure hypercholesterolemia, unspecified: Secondary | ICD-10-CM

## 2022-04-09 DIAGNOSIS — I5022 Chronic systolic (congestive) heart failure: Secondary | ICD-10-CM

## 2022-04-09 DIAGNOSIS — I2510 Atherosclerotic heart disease of native coronary artery without angina pectoris: Secondary | ICD-10-CM

## 2022-04-09 DIAGNOSIS — I059 Rheumatic mitral valve disease, unspecified: Secondary | ICD-10-CM

## 2022-04-09 DIAGNOSIS — I10 Essential (primary) hypertension: Secondary | ICD-10-CM

## 2022-04-09 NOTE — Patient Instructions (Addendum)
  Testing/Procedures:  Your physician has requested that you have an echocardiogram. Echocardiography is a painless test that uses sound waves to create images of your heart. It provides your doctor with information about the size and shape of your heart and how well your heart's chambers and valves are working. This procedure takes approximately one hour. There are no restrictions for this procedure. Please do NOT wear cologne, perfume, aftershave, or lotions (deodorant is allowed). Please arrive 15 minutes prior to your appointment time. [address]-SCHEDULE IN MAY   Follow-Up: At [facility], you and your health needs are our priority.  As part of our continuing mission to provide you with exceptional heart care, we have created designated Provider Care Teams.  These Care Teams include your primary Cardiologist (physician) and Advanced Practice Providers (APPs -  Physician Assistants and Nurse Practitioners) who all work together to provide you with the care you need, when you need it.  We recommend signing up for the patient portal called "MyChart".  Sign up information is provided on this After Visit Summary.  MyChart is used to connect with patients for Virtual Visits (Telemedicine).  Patients are able to view lab/test results, encounter notes, upcoming appointments, etc.  Non-urgent messages can be sent to your provider as well.   To learn more about what you can do with MyChart, go to [email or web address].    Your next appointment:   6 month(s)  Provider:   Brian Crenshaw, MD     

## 2022-04-10 ENCOUNTER — Other Ambulatory Visit: Payer: Self-pay | Admitting: *Deleted

## 2022-04-10 ENCOUNTER — Telehealth: Payer: Self-pay | Admitting: *Deleted

## 2022-04-10 DIAGNOSIS — I5022 Chronic systolic (congestive) heart failure: Secondary | ICD-10-CM

## 2022-04-10 LAB — COMPREHENSIVE METABOLIC PANEL
ALT: 13 IU/L (ref 0–32)
AST: 19 IU/L (ref 0–40)
Albumin/Globulin Ratio: 1.1 — ABNORMAL LOW (ref 1.2–2.2)
Albumin: 3.9 g/dL (ref 3.8–4.8)
Alkaline Phosphatase: 118 IU/L (ref 44–121)
BUN/Creatinine Ratio: 28 (ref 12–28)
BUN: 91 mg/dL (ref 8–27)
Bilirubin Total: 0.4 mg/dL (ref 0.0–1.2)
CO2: 26 mmol/L (ref 20–29)
Calcium: 9.2 mg/dL (ref 8.7–10.3)
Chloride: 89 mmol/L — ABNORMAL LOW (ref 96–106)
Creatinine, Ser: 3.26 mg/dL — ABNORMAL HIGH (ref 0.57–1.00)
Globulin, Total: 3.6 g/dL (ref 1.5–4.5)
Glucose: 296 mg/dL — ABNORMAL HIGH (ref 70–99)
Potassium: 4 mmol/L (ref 3.5–5.2)
Sodium: 135 mmol/L (ref 134–144)
Total Protein: 7.5 g/dL (ref 6.0–8.5)
eGFR: 15 mL/min/{1.73_m2} — ABNORMAL LOW (ref >59)

## 2022-04-10 LAB — LIPID PANEL
Chol/HDL Ratio: 2.6 ratio (ref 0.0–4.4)
Cholesterol, Total: 135 mg/dL (ref 100–199)
HDL: 51 mg/dL (ref >39)
LDL Chol Calc (NIH): 65 mg/dL (ref 0–99)
Triglycerides: 105 mg/dL (ref 0–149)
VLDL Cholesterol Cal: 19 mg/dL (ref 5–40)

## 2022-04-10 MED ORDER — FUROSEMIDE 80 MG PO TABS: 40.0000 mg | ORAL_TABLET | Freq: Every day | ORAL | 3 refills | Status: DC

## 2022-04-10 NOTE — Telephone Encounter (Signed)
-----   Message from Lelon Perla, MD sent at 04/10/2022  6:52 AM EDT ----- Decrease Lasix to 40 mg daily with an additional 40 mg as needed for weight gain of 2 to 3 pounds or worsening lower extremity edema.  Check bmet in 1 week. Kirk Ruths

## 2022-04-10 NOTE — Telephone Encounter (Signed)
Left message for pt to call.

## 2022-04-10 NOTE — Telephone Encounter (Signed)
Patient returning call.

## 2022-04-10 NOTE — Addendum Note (Signed)
Addended by: Cristopher Estimable on: 04/10/2022 10:56 AM   Modules accepted: Orders

## 2022-04-10 NOTE — Telephone Encounter (Signed)
Spoke with pt, Aware of dr crenshaw's recommendations.  Lab orders mailed to the pt  

## 2022-04-12 NOTE — Telephone Encounter (Signed)
Requesting office sent duplicate request with new date of procedure of 05/22/22. Pt recently saw Dr. Stanford Breed for pre op clearance. Pt will need an echo on 05/11/22 before clearance can be signed off. Once the pt has been cleared our office will fax clearance notes and recommendations.   I will update the requesting office.

## 2022-05-02 ENCOUNTER — Encounter: Payer: 59 | Admitting: Internal Medicine

## 2022-05-02 NOTE — Progress Notes (Deleted)
CC: diabetes  HPI:  [name] is a 72 y.o. female living with a history stated below and presents today for follow-up of her diabetes. Please see problem based assessment and plan for additional details.  Past Medical History:  Diagnosis Date   Acute exacerbation of CHF (congestive heart failure) ([place]) 09/12/2017   Acute gallstone pancreatitis    Acute gallstone pancreatitis    Acute on chronic systolic heart failure (HCC)    Bilateral pleural effusion 02/22/2014   Cardiomyopathy, ischemic, EF 25-30% 03/23/2014   03/01/2014- Date of procedure- CABG- X5- Severe 3-vessel coronary artery disease with severe LV dysfunction.  Ejection fraction 25-30%.  SURGICAL PROCEDURE:  Coronary artery bypass grafting x 5. SURGEON:  Lanelle Bal, MD.      Cardiomyopathy, ischemic, EF 25-30% 03/23/2014   03/01/2014- Date of procedure- CABG- X5- Severe 3-vessel coronary artery disease with severe LV dysfunction.  Ejection fraction 25-30%.  SURGICAL PROCEDURE:  Coronary artery bypass grafting x 5. SURGEON:  Lanelle Bal, MD.      Chronic systolic heart failure [facility])    Coronary artery disease involving native coronary artery of native heart without angina pectoris    Diabetes mellitus 1998   Dx in 1998. Microalbuminuria, never on insulin.   Diabetes mellitus type 2, controlled, without complications ([place]) 123456   Last A1C 6.7 (03/21/17)    On Diet Control Only Last Foot Exam: 03/21/17    Last eye exam: to be scheduled early 2019    Dyspnea    Essential hypertension 12/05/2005   Lisinopril 40mg  Daily, Coreg 25mg  BID.    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     atorvastatin (LIPITOR) 80 MG tablet Take 1 tablet (80 mg total) by mouth daily. 90 tablet 1   calcium carbonate (OS-CAL - DOSED IN MG OF ELEMENTAL CALCIUM) 1250 (500 Ca) MG tablet Take 1 tablet by mouth daily.     carvedilol (COREG) 25 MG tablet TAKE 1 TABLET BY MOUTH TWICE A DAY WITH A MEAL 180 tablet 1   cholecalciferol (VITAMIN D3) 25 MCG (1000 UT) tablet Take 1 tablet by mouth daily.     empagliflozin (JARDIANCE) 25 MG TABS tablet Take 1 tablet (25 mg total) by mouth daily. 30 tablet 3   ezetimibe (ZETIA) 10 MG tablet TAKE ONE TABLET BY MOUTH DAILY 90 tablet 3   furosemide (LASIX) 80 MG tablet Take 0.5 tablets (40 mg total) by mouth daily. 90 tablet 3   Insulin Pen Needle (PEN NEEDLES) 32G X 4 MM MISC 1 each by Does not apply route as directed. 100 each 1   metolazone (ZAROXOLYN) 5 MG tablet Take 5 mg by mouth as directed.     sacubitril-valsartan (ENTRESTO) 24-26 MG Take 1 tablet by mouth 2 (two) times daily. 180 tablet 3   Semaglutide,0.25 or 0.5MG /DOS, 2 MG/3ML SOPN Inject 0.5 mg into the skin once a week. 3 mL 2   No current  facility-administered medications on file prior to visit.    Family History  Problem Relation Age of Onset   Diabetes Mother        Mother died of MI at age 7   Heart failure Mother    Kidney failure Mother    Heart failure Sister        Died   Heart failure Brother        Died   Diabetes Brother        Died   Breast cancer Neg Hx     Social History   Socioeconomic History   Marital status: Widowed    Spouse name: Not on file   Number of children: Not on file   Years of education: Not on file   Highest education level: Not on file  Occupational History   Occupation: Automotive engineer  Tobacco Use   Smoking status: Never   Smokeless tobacco: Never  Vaping Use   Vaping Use:  Never used  Substance and Sexual Activity   Alcohol use: No    Alcohol/week: 0.0 standard drinks of alcohol   Drug use: No   Sexual activity: Not on file  Other Topics Concern   Not on file  Social History Narrative   Married x 42 yrs.  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   Never smoked. Doesn't drink or use drugs.   Does Patient Exercise:  yes - sometimes      To get diabetes supplies for [name] RD, CDE, April 20,2011   Social Determinants of Health   Financial Resource Strain: Not on file  Food Insecurity: No Food Insecurity (01/10/2022)   Hunger Vital Sign    Worried About Running Out of Food in the Last Year: Never true    Ran Out of Food in the Last Year: Never true  Transportation Needs: No Transportation Needs (01/10/2022)   PRAPARE - Hydrologist (Medical): No    Lack of Transportation (Non-Medical): No  Physical Activity: Inactive (01/10/2022)   Exercise Vital Sign    Days of Exercise per Week: 0 days    Minutes of Exercise per Session: 0 min  Stress: No Stress Concern Present (01/10/2022)   [place]    Feeling of Stress : Only a little  Social Connections: Socially Isolated (01/10/2022)   Social Connection and Isolation Panel [NHANES]    Frequency of Communication with Friends and Family: More than three times a week    Frequency of Social Gatherings with Friends and Family: More than three times a week    Attends Religious Services: Never    Marine scientist or Organizations: No    Attends Archivist Meetings: Never    Marital Status: Widowed  Intimate Partner Violence: Not At Risk (01/10/2022)   Humiliation, Afraid, Rape, and Kick questionnaire    Fear of Current or Ex-Partner: No    Emotionally Abused: No    Physically Abused: No    Sexually Abused: No    Review of  Systems: ROS negative except for what is noted on the assessment and plan.  There were no vitals filed for this visit.  Physical Exam: Constitutional: well-appearing *** sitting in ***, in no acute distress HENT: normocephalic atraumatic, mucous membranes moist Eyes: conjunctiva non-erythematous Cardiovascular: regular rate and rhythm, no m/r/g Pulmonary/Chest: normal work of breathing on room air, lungs clear to  auscultation bilaterally Abdominal: soft, non-tender, non-distended MSK: normal bulk and tone Neurological: alert & oriented x 3, no focal deficit Skin: warm and dry Psych: normal mood and behavior  Assessment & Plan:   Diabetes w CKD4: A1c 11.1 > -Jardiance 25, Ozempic?   Patient {GC/GE:[id number]::"discussed with","seen with"} Dr. LF:[id number]. Hoffman","Mullen","Narendra","Vincent","Guilloud","Lau","Machen"}  No problem-specific Assessment & Plan notes found for this encounter.   Buddy Duty, D.O. [facility], PGY-2 Phone: [phone] Date 05/02/2022 Time 8:13 AM

## 2022-05-11 ENCOUNTER — Ambulatory Visit (HOSPITAL_COMMUNITY): Payer: 59

## 2022-05-15 ENCOUNTER — Encounter (HOSPITAL_COMMUNITY): Payer: Self-pay | Admitting: Gastroenterology

## 2022-05-15 ENCOUNTER — Encounter: Payer: Self-pay | Admitting: *Deleted

## 2022-05-15 ENCOUNTER — Telehealth: Payer: Self-pay | Admitting: *Deleted

## 2022-05-15 LAB — BASIC METABOLIC PANEL
BUN/Creatinine Ratio: 17 (ref 12–28)
BUN: 51 mg/dL — ABNORMAL HIGH (ref 8–27)
CO2: 23 mmol/L (ref 20–29)
Calcium: 9.3 mg/dL (ref 8.7–10.3)
Chloride: 100 mmol/L (ref 96–106)
Creatinine, Ser: 2.93 mg/dL — ABNORMAL HIGH (ref 0.57–1.00)
Glucose: 169 mg/dL — ABNORMAL HIGH (ref 70–99)
Potassium: 4.3 mmol/L (ref 3.5–5.2)
Sodium: 140 mmol/L (ref 134–144)
eGFR: 17 mL/min/{1.73_m2} — ABNORMAL LOW (ref >59)

## 2022-05-15 NOTE — Telephone Encounter (Signed)
Will make [facility] pre-op aware.

## 2022-05-15 NOTE — Telephone Encounter (Signed)
Morning, I'm with pre op dept at [facility] and this pt is scheduled for colonoscopy 4/23 looks like she was supposed to get clearance at her appt on 3/11 with Dr Jens Som and I dont see anything in his note about clearance. Also is she supposed to have an echo before the 23rd? The next appt I see for her is in may? I will forward your questions to dr Jens Som to get clearance and if the echo needs to be prior to procedure.

## 2022-05-22 ENCOUNTER — Ambulatory Visit (HOSPITAL_COMMUNITY): Payer: 59 | Admitting: Registered Nurse

## 2022-05-22 ENCOUNTER — Ambulatory Visit (HOSPITAL_COMMUNITY)
Admission: RE | Admit: 2022-05-22 | Discharge: 2022-05-22 | Disposition: A | Discharge status: Home / Self Care | Payer: 59 | Attending: Gastroenterology | Admitting: Gastroenterology

## 2022-05-22 ENCOUNTER — Ambulatory Visit (HOSPITAL_BASED_OUTPATIENT_CLINIC_OR_DEPARTMENT_OTHER): Payer: 59 | Admitting: Registered Nurse

## 2022-05-22 ENCOUNTER — Other Ambulatory Visit: Payer: Self-pay

## 2022-05-22 ENCOUNTER — Encounter (HOSPITAL_COMMUNITY)
Admission: RE | Disposition: A | Discharge status: Home / Self Care | Payer: Self-pay | Source: Home / Self Care | Attending: Gastroenterology

## 2022-05-22 ENCOUNTER — Encounter (HOSPITAL_COMMUNITY): Payer: Self-pay | Admitting: Gastroenterology

## 2022-05-22 DIAGNOSIS — Z1211 Encounter for screening for malignant neoplasm of colon: Secondary | ICD-10-CM | POA: Insufficient documentation

## 2022-05-22 DIAGNOSIS — K644 Residual hemorrhoidal skin tags: Secondary | ICD-10-CM | POA: Diagnosis not present

## 2022-05-22 DIAGNOSIS — N189 Chronic kidney disease, unspecified: Secondary | ICD-10-CM | POA: Diagnosis not present

## 2022-05-22 DIAGNOSIS — E1122 Type 2 diabetes mellitus with diabetic chronic kidney disease: Secondary | ICD-10-CM

## 2022-05-22 DIAGNOSIS — Z841 Family history of disorders of kidney and ureter: Secondary | ICD-10-CM | POA: Diagnosis not present

## 2022-05-22 DIAGNOSIS — Z8249 Family history of ischemic heart disease and other diseases of the circulatory system: Secondary | ICD-10-CM | POA: Insufficient documentation

## 2022-05-22 DIAGNOSIS — D128 Benign neoplasm of rectum: Secondary | ICD-10-CM | POA: Diagnosis not present

## 2022-05-22 DIAGNOSIS — I2510 Atherosclerotic heart disease of native coronary artery without angina pectoris: Secondary | ICD-10-CM | POA: Diagnosis not present

## 2022-05-22 DIAGNOSIS — K648 Other hemorrhoids: Secondary | ICD-10-CM

## 2022-05-22 DIAGNOSIS — Z86010 Personal history of colonic polyps: Secondary | ICD-10-CM

## 2022-05-22 DIAGNOSIS — Z951 Presence of aortocoronary bypass graft: Secondary | ICD-10-CM | POA: Diagnosis not present

## 2022-05-22 DIAGNOSIS — Z7985 Long-term (current) use of injectable non-insulin antidiabetic drugs: Secondary | ICD-10-CM | POA: Insufficient documentation

## 2022-05-22 DIAGNOSIS — K5730 Diverticulosis of large intestine without perforation or abscess without bleeding: Secondary | ICD-10-CM

## 2022-05-22 DIAGNOSIS — K219 Gastro-esophageal reflux disease without esophagitis: Secondary | ICD-10-CM | POA: Insufficient documentation

## 2022-05-22 DIAGNOSIS — I509 Heart failure, unspecified: Secondary | ICD-10-CM | POA: Diagnosis not present

## 2022-05-22 DIAGNOSIS — I130 Hypertensive heart and chronic kidney disease with heart failure and stage 1 through stage 4 chronic kidney disease, or unspecified chronic kidney disease: Secondary | ICD-10-CM | POA: Diagnosis not present

## 2022-05-22 DIAGNOSIS — D631 Anemia in chronic kidney disease: Secondary | ICD-10-CM

## 2022-05-22 DIAGNOSIS — Z7984 Long term (current) use of oral hypoglycemic drugs: Secondary | ICD-10-CM | POA: Insufficient documentation

## 2022-05-22 DIAGNOSIS — K621 Rectal polyp: Secondary | ICD-10-CM | POA: Diagnosis not present

## 2022-05-22 DIAGNOSIS — Z833 Family history of diabetes mellitus: Secondary | ICD-10-CM | POA: Diagnosis not present

## 2022-05-22 HISTORY — PX: COLONOSCOPY WITH PROPOFOL: SHX5780

## 2022-05-22 HISTORY — PX: POLYPECTOMY: SHX5525

## 2022-05-22 LAB — POCT I-STAT, CHEM 8
BUN: 27 mg/dL — ABNORMAL HIGH (ref 8–23)
Calcium, Ion: 1.1 mmol/L — ABNORMAL LOW (ref 1.15–1.40)
Chloride: 102 mmol/L (ref 98–111)
Creatinine, Ser: 1.8 mg/dL — ABNORMAL HIGH (ref 0.44–1.00)
Glucose, Bld: 100 mg/dL — ABNORMAL HIGH (ref 70–99)
HCT: 32 % — ABNORMAL LOW (ref 36.0–46.0)
Hemoglobin: 10.9 g/dL — ABNORMAL LOW (ref 12.0–15.0)
Potassium: 4.3 mmol/L (ref 3.5–5.1)
Sodium: 135 mmol/L (ref 135–145)
TCO2: 23 mmol/L (ref 22–32)

## 2022-05-22 SURGERY — COLONOSCOPY WITH PROPOFOL
Anesthesia: Monitor Anesthesia Care

## 2022-05-22 MED ORDER — PHENYLEPHRINE HCL-NACL 20-0.9 MG/250ML-% IV SOLN
INTRAVENOUS | Status: DC | PRN
  Administered 2022-05-22: 20 ug/min via INTRAVENOUS

## 2022-05-22 MED ORDER — LACTATED RINGERS IV SOLN: INTRAVENOUS | Status: DC

## 2022-05-22 MED ORDER — PROPOFOL 500 MG/50ML IV EMUL
INTRAVENOUS | Status: AC
  Filled 2022-05-22: qty 50

## 2022-05-22 MED ORDER — PROPOFOL 500 MG/50ML IV EMUL
INTRAVENOUS | Status: DC | PRN
  Administered 2022-05-22: 120 ug/kg/min via INTRAVENOUS

## 2022-05-22 MED ORDER — PROPOFOL 1000 MG/100ML IV EMUL
INTRAVENOUS | Status: AC
  Filled 2022-05-22: qty 100

## 2022-05-22 MED ORDER — SODIUM CHLORIDE 0.9 % IV SOLN: INTRAVENOUS | Status: DC

## 2022-05-22 SURGICAL SUPPLY — 22 items

## 2022-05-22 NOTE — Anesthesia Procedure Notes (Signed)
Procedure Name: MAC Date/Time: 05/22/2022 7:45 AM  Performed by: Elisabeth Cara, CRNAPre-anesthesia Checklist: Patient identified, Emergency Drugs available, Suction available, Patient being monitored and Timeout performed Patient Re-evaluated:Patient Re-evaluated prior to induction Oxygen Delivery Method: Simple face mask Placement Confirmation: positive ETCO2 and breath sounds checked- equal and bilateral Dental Injury: Teeth and Oropharynx as per pre-operative assessment

## 2022-05-22 NOTE — Anesthesia Postprocedure Evaluation (Signed)
Anesthesia Post Note  Patient: MARLAINA COBURN  Procedure(s) Performed: COLONOSCOPY WITH PROPOFOL POLYPECTOMY     Patient location during evaluation: PACU Anesthesia Type: MAC Level of consciousness: awake and alert Pain management: pain level controlled Vital Signs Assessment: post-procedure vital signs reviewed and stable Respiratory status: spontaneous breathing, nonlabored ventilation, respiratory function stable and patient connected to nasal cannula oxygen Cardiovascular status: stable and blood pressure returned to baseline Postop Assessment: no apparent nausea or vomiting Anesthetic complications: no  No notable events documented.  Last Vitals:  Vitals:   05/22/22 0845 05/22/22 0850  BP:  126/71  Pulse:  76  Resp: 18 18  Temp:    SpO2: (!) 2% 93%    Last Pain:  Vitals:   05/22/22 0850  TempSrc:   PainSc: 0-No pain                 Kennieth Rad

## 2022-05-22 NOTE — Anesthesia Preprocedure Evaluation (Signed)
Anesthesia Evaluation  Patient identified by MRN, date of birth, ID band Patient awake    Reviewed: Allergy & Precautions, NPO status , Patient's Chart, lab work & pertinent test results  Airway Mallampati: II  TM Distance: >3 FB Neck ROM: Full    Dental  (+) Edentulous Upper, Edentulous Lower   Pulmonary shortness of breath and with exertion   Pulmonary exam normal        Cardiovascular hypertension, Pt. on medications and Pt. on home beta blockers + CAD, + CABG and +CHF  Normal cardiovascular exam+ dysrhythmias   EF 25-30%   Neuro/Psych negative neurological ROS     GI/Hepatic Neg liver ROS,GERD  ,,  Endo/Other  diabetes, Type 2    Renal/GU CRFRenal disease     Musculoskeletal   Abdominal   Peds  Hematology  (+) Blood dyscrasia, anemia   Anesthesia Other Findings   Reproductive/Obstetrics                             Anesthesia Physical Anesthesia Plan  ASA: 4  Anesthesia Plan: MAC   Post-op Pain Management:    Induction:   PONV Risk Score and Plan: 1 and Propofol infusion  Airway Management Planned: Natural Airway and Simple Face Mask  Additional Equipment:   Intra-op Plan:   Post-operative Plan:   Informed Consent: I have reviewed the patients History and Physical, chart, labs and discussed the procedure including the risks, benefits and alternatives for the proposed anesthesia with the patient or authorized representative who has indicated his/her understanding and acceptance.       Plan Discussed with: CRNA  Anesthesia Plan Comments:        Anesthesia Quick Evaluation

## 2022-05-22 NOTE — Op Note (Signed)
[facility] Patient Name: [name] Procedure Date: 05/22/2022 MRN: [id number] Attending MD: Vida Rigger , MD, [id number] Date of Birth: 1950-11-08 CSN: [id number] Age: 72 Admit Type: Outpatient Procedure:                Colonoscopy Indications:              High risk colon cancer surveillance: Personal                            history of colonic polyps, Last colonoscopy:                            December 2016 Providers:                Vida Rigger, MD, Fransisca Connors, Irene Shipper,                            Technician Referring MD:              Medicines:                Monitored Anesthesia Care Complications:            No immediate complications. Estimated Blood Loss:     Estimated blood loss: none. Procedure:                Pre-Anesthesia Assessment:                           - Prior to the procedure, a History and Physical                            was performed, and patient medications and                            allergies were reviewed. The patient's tolerance of                            previous anesthesia was also reviewed. The risks                            and benefits of the procedure and the sedation                            options and risks were discussed with the patient.                            All questions were answered, and informed consent                            was obtained. Prior Anticoagulants: The patient has                            taken no anticoagulant or antiplatelet agents                            except for aspirin. ASA Grade Assessment: IV -  A                            patient with severe systemic disease that is a                            constant threat to life. After reviewing the risks                            and benefits, the patient was deemed in                            satisfactory condition to undergo the procedure.                           After obtaining informed consent, the colonoscope                             was passed under direct vision. Throughout the                            procedure, the patient's blood pressure, pulse, and                            oxygen saturations were monitored continuously. The                            PCF-HQ190L ([id number] Olympus colonoscope was                            introduced through the anus and advanced to the the                            terminal ileum. The colonoscopy was performed                            without difficulty. The patient tolerated the                            procedure well. The quality of the bowel                            preparation was adequate. The terminal ileum,                            ileocecal valve, appendiceal orifice, and rectum                            were photographed. Scope In: 7:52:57 AM Scope Out: 8:14:45 AM Scope Withdrawal Time: 0 hours 14 minutes 59 seconds  Total Procedure Duration: 0 hours 21 minutes 48 seconds  Findings:      External and internal hemorrhoids were found during retroflexion, during       perianal exam and during digital exam. The hemorrhoids were small.      A  diminutive polyp was found in the proximal rectum. The polyp was       semi-sessile. Biopsies were taken with a cold forceps for histology.      A few small-mouthed diverticula were found in the sigmoid colon.      The terminal ileum appeared normal.      The exam was otherwise without abnormality on direct and retroflexion       views. Impression:               - External and internal hemorrhoids.                           - One diminutive polyp in the proximal rectum.                            Biopsied.                           - Diverticulosis in the sigmoid colon.                           - The examined portion of the ileum was normal.                           - The examination was otherwise normal on direct                            and retroflexion views. Moderate Sedation:       Not Applicable - Patient had care per Anesthesia. Recommendation:           - Patient has a contact number available for                            emergencies. The signs and symptoms of potential                            delayed complications were discussed with the                            patient. Return to normal activities tomorrow.                            Written discharge instructions were provided to the                            patient.                           - Soft diet today.                           - Continue present medications.                           - Await pathology results.                           -  Repeat colonoscopy date to be determined after                            pending pathology results are reviewed for                            surveillance based on pathology results. But doubt                            I would recheck again in less worrisome symptoms                           - Return to GI office PRN.                           - Telephone GI clinic for pathology results in 1                            week.                           - Telephone GI clinic if symptomatic PRN. Procedure Code(s):        --- Professional ---                           [phone], Colonoscopy, flexible; with biopsy, single                            or multiple Diagnosis Code(s):        --- Professional ---                           Z86.010, Personal history of colonic polyps                           D12.8, Benign neoplasm of rectum CPT copyright 2022 American Medical Association. All rights reserved. The codes documented in this report are preliminary and upon coder review may  be revised to meet current compliance requirements. Vida Rigger, MD 05/22/2022 8:22:10 AM This report has been signed electronically. Number of Addenda: 0

## 2022-05-22 NOTE — Transfer of Care (Signed)
Immediate Anesthesia Transfer of Care Note  Patient: [name]  Procedure(s) Performed: COLONOSCOPY WITH PROPOFOL POLYPECTOMY  Patient Location: PACU  Anesthesia Type:MAC  Level of Consciousness: awake, alert , oriented, and patient cooperative  Airway & Oxygen Therapy: Patient Spontanous Breathing and Patient connected to face mask oxygen  Post-op Assessment: Report given to RN, Post -op Vital signs reviewed and stable, and Patient moving all extremities  Post vital signs: Reviewed and stable  Last Vitals:  Vitals Value Taken Time  BP 107/45 05/22/22 0824  Temp    Pulse 77 05/22/22 0826  Resp 20 05/22/22 0826  SpO2 100 % 05/22/22 0826  Vitals shown include unvalidated device data.  Last Pain:  Vitals:   05/22/22 0653  TempSrc: Temporal  PainSc: 0-No pain         Complications: No notable events documented.

## 2022-05-22 NOTE — Discharge Instructions (Addendum)
Call if GI question or problem otherwise start with soft solid diet like breakfast food and call for biopsy report in 1 week and follow-up in the office as needed from a GI standpoint wise okay to resume your regular medicine todayYOU HAD AN ENDOSCOPIC PROCEDURE TODAY: Refer to the procedure report and other information in the discharge instructions given to you for any specific questions about what was found during the examination. If this information does not answer your questions, please call [place] office at [phone] to clarify.   YOU SHOULD EXPECT: Some feelings of bloating in the abdomen. Passage of more gas than usual. Walking can help get rid of the air that was put into your GI tract during the procedure and reduce the bloating. If you had a lower endoscopy (such as a colonoscopy or flexible sigmoidoscopy) you may notice spotting of blood in your stool or on the toilet paper. Some abdominal soreness may be present for a day or two, also.  DIET: Your first meal following the procedure should be a light meal and then it is ok to progress to your normal diet. A half-sandwich or bowl of soup is an example of a good first meal. Heavy or fried foods are harder to digest and may make you feel nauseous or bloated. Drink plenty of fluids but you should avoid alcoholic beverages for 24 hours. If you had a esophageal dilation, please see attached instructions for diet.    ACTIVITY: Your care partner should take you home directly after the procedure. You should plan to take it easy, moving slowly for the rest of the day. You can resume normal activity the day after the procedure however YOU SHOULD NOT DRIVE, use power tools, machinery or perform tasks that involve climbing or major physical exertion for 24 hours (because of the sedation medicines used during the test).   SYMPTOMS TO REPORT IMMEDIATELY: A gastroenterologist can be reached at any hour. Please call [phone]  for any of the following  symptoms:  Following lower endoscopy (colonoscopy, flexible sigmoidoscopy) Excessive amounts of blood in the stool  Significant tenderness, worsening of abdominal pains  Swelling of the abdomen that is new, acute  Fever of 100 or higher  Following upper endoscopy (EGD, EUS, ERCP, esophageal dilation) Vomiting of blood or coffee ground material  New, significant abdominal pain  New, significant chest pain or pain under the shoulder blades  Painful or persistently difficult swallowing  New shortness of breath  Black, tarry-looking or red, bloody stools  FOLLOW UP:  If any biopsies were taken you will be contacted by phone or by letter within the next 1-3 weeks. Call [phone]  if you have not heard about the biopsies in 3 weeks.  Please also call with any specific questions about appointments or follow up tests.

## 2022-05-22 NOTE — Progress Notes (Signed)
[name] 7:44 AM  Subjective: Patient without any GI complaints and no new medical problems since she was seen recently in the office we rediscussed her procedure  Objective: Signs stable afebrile exam please see preassessment evaluation  Assessment: History of colon polyps  Plan: Okay to proceed with colonoscopy with anesthesia assistance  [facility]  office [phone] After 5PM or if no answer call [phone]

## 2022-05-23 LAB — SURGICAL PATHOLOGY

## 2022-05-25 ENCOUNTER — Encounter (HOSPITAL_COMMUNITY): Payer: Self-pay | Admitting: Gastroenterology

## 2022-06-05 ENCOUNTER — Ambulatory Visit (HOSPITAL_COMMUNITY): Payer: 59 | Attending: Cardiology

## 2022-06-05 DIAGNOSIS — I059 Rheumatic mitral valve disease, unspecified: Secondary | ICD-10-CM | POA: Diagnosis not present

## 2022-06-05 MED ORDER — PERFLUTREN LIPID MICROSPHERE
1.0000 mL | INTRAVENOUS | Status: AC | PRN
Start: 2022-06-05 — End: 2022-06-05
  Administered 2022-06-05: 2 mL via INTRAVENOUS

## 2022-06-07 LAB — ECHOCARDIOGRAM COMPLETE
Area-P 1/2: 3.81 cm2
Est EF: <20
MV M vel: 4.15 m/s
MV Peak grad: 68.8 mmHg
S' Lateral: 4.1 cm

## 2022-06-20 ENCOUNTER — Other Ambulatory Visit: Payer: Self-pay | Admitting: Cardiology

## 2022-06-20 DIAGNOSIS — E785 Hyperlipidemia, unspecified: Secondary | ICD-10-CM

## 2022-06-22 ENCOUNTER — Other Ambulatory Visit: Payer: Self-pay | Admitting: Internal Medicine

## 2022-06-22 DIAGNOSIS — E119 Type 2 diabetes mellitus without complications: Secondary | ICD-10-CM

## 2022-06-26 NOTE — Telephone Encounter (Signed)
Please address rx. 

## 2022-07-26 ENCOUNTER — Other Ambulatory Visit: Payer: Self-pay

## 2022-07-26 ENCOUNTER — Emergency Department (HOSPITAL_COMMUNITY): Payer: 59

## 2022-07-26 ENCOUNTER — Inpatient Hospital Stay (HOSPITAL_COMMUNITY)
Admission: EM | Admit: 2022-07-26 | Discharge: 2022-07-30 | DRG: 291 | Disposition: A | Discharge status: Home / Self Care | Payer: 59 | Attending: Student in an Organized Health Care Education/Training Program | Admitting: Student in an Organized Health Care Education/Training Program

## 2022-07-26 DIAGNOSIS — I5023 Acute on chronic systolic (congestive) heart failure: Secondary | ICD-10-CM | POA: Diagnosis present

## 2022-07-26 DIAGNOSIS — I428 Other cardiomyopathies: Secondary | ICD-10-CM | POA: Diagnosis not present

## 2022-07-26 DIAGNOSIS — Z794 Long term (current) use of insulin: Secondary | ICD-10-CM | POA: Diagnosis not present

## 2022-07-26 DIAGNOSIS — K5900 Constipation, unspecified: Secondary | ICD-10-CM | POA: Diagnosis present

## 2022-07-26 DIAGNOSIS — I447 Left bundle-branch block, unspecified: Secondary | ICD-10-CM | POA: Diagnosis present

## 2022-07-26 DIAGNOSIS — Z7982 Long term (current) use of aspirin: Secondary | ICD-10-CM | POA: Diagnosis not present

## 2022-07-26 DIAGNOSIS — I255 Ischemic cardiomyopathy: Secondary | ICD-10-CM | POA: Diagnosis present

## 2022-07-26 DIAGNOSIS — Z90710 Acquired absence of both cervix and uterus: Secondary | ICD-10-CM | POA: Diagnosis not present

## 2022-07-26 DIAGNOSIS — F32A Depression, unspecified: Secondary | ICD-10-CM | POA: Diagnosis present

## 2022-07-26 DIAGNOSIS — N184 Chronic kidney disease, stage 4 (severe): Secondary | ICD-10-CM | POA: Diagnosis present

## 2022-07-26 DIAGNOSIS — Z951 Presence of aortocoronary bypass graft: Secondary | ICD-10-CM | POA: Diagnosis not present

## 2022-07-26 DIAGNOSIS — Z79899 Other long term (current) drug therapy: Secondary | ICD-10-CM

## 2022-07-26 DIAGNOSIS — R5381 Other malaise: Secondary | ICD-10-CM

## 2022-07-26 DIAGNOSIS — Z7984 Long term (current) use of oral hypoglycemic drugs: Secondary | ICD-10-CM

## 2022-07-26 DIAGNOSIS — Z66 Do not resuscitate: Secondary | ICD-10-CM | POA: Diagnosis present

## 2022-07-26 DIAGNOSIS — I130 Hypertensive heart and chronic kidney disease with heart failure and stage 1 through stage 4 chronic kidney disease, or unspecified chronic kidney disease: Secondary | ICD-10-CM | POA: Diagnosis present

## 2022-07-26 DIAGNOSIS — I110 Hypertensive heart disease with heart failure: Secondary | ICD-10-CM | POA: Diagnosis not present

## 2022-07-26 DIAGNOSIS — E1122 Type 2 diabetes mellitus with diabetic chronic kidney disease: Secondary | ICD-10-CM | POA: Diagnosis present

## 2022-07-26 DIAGNOSIS — Z91199 Patient's noncompliance with other medical treatment and regimen due to unspecified reason: Secondary | ICD-10-CM

## 2022-07-26 DIAGNOSIS — Z7985 Long-term (current) use of injectable non-insulin antidiabetic drugs: Secondary | ICD-10-CM | POA: Diagnosis not present

## 2022-07-26 DIAGNOSIS — I2510 Atherosclerotic heart disease of native coronary artery without angina pectoris: Secondary | ICD-10-CM | POA: Diagnosis present

## 2022-07-26 DIAGNOSIS — I252 Old myocardial infarction: Secondary | ICD-10-CM

## 2022-07-26 DIAGNOSIS — K761 Chronic passive congestion of liver: Secondary | ICD-10-CM | POA: Diagnosis present

## 2022-07-26 DIAGNOSIS — I051 Rheumatic mitral insufficiency: Secondary | ICD-10-CM | POA: Diagnosis present

## 2022-07-26 DIAGNOSIS — R0602 Shortness of breath: Secondary | ICD-10-CM | POA: Diagnosis not present

## 2022-07-26 DIAGNOSIS — I509 Heart failure, unspecified: Principal | ICD-10-CM

## 2022-07-26 DIAGNOSIS — I5022 Chronic systolic (congestive) heart failure: Secondary | ICD-10-CM

## 2022-07-26 DIAGNOSIS — E785 Hyperlipidemia, unspecified: Secondary | ICD-10-CM | POA: Diagnosis present

## 2022-07-26 DIAGNOSIS — I10 Essential (primary) hypertension: Secondary | ICD-10-CM | POA: Diagnosis present

## 2022-07-26 DIAGNOSIS — Z91148 Patient's other noncompliance with medication regimen for other reason: Secondary | ICD-10-CM | POA: Diagnosis not present

## 2022-07-26 LAB — BRAIN NATRIURETIC PEPTIDE: B Natriuretic Peptide: 2937.9 pg/mL — ABNORMAL HIGH (ref 0.0–100.0)

## 2022-07-26 LAB — TROPONIN I (HIGH SENSITIVITY)
Troponin I (High Sensitivity): 27 ng/L — ABNORMAL HIGH (ref <18)
Troponin I (High Sensitivity): 29 ng/L — ABNORMAL HIGH (ref <18)

## 2022-07-26 LAB — BASIC METABOLIC PANEL
Anion gap: 16 — ABNORMAL HIGH (ref 5–15)
BUN: 39 mg/dL — ABNORMAL HIGH (ref 8–23)
CO2: 19 mmol/L — ABNORMAL LOW (ref 22–32)
Calcium: 9.4 mg/dL (ref 8.9–10.3)
Chloride: 101 mmol/L (ref 98–111)
Creatinine, Ser: 2.27 mg/dL — ABNORMAL HIGH (ref 0.44–1.00)
GFR, Estimated: 22 mL/min — ABNORMAL LOW (ref >60)
Glucose, Bld: 220 mg/dL — ABNORMAL HIGH (ref 70–99)
Potassium: 4.7 mmol/L (ref 3.5–5.1)
Sodium: 136 mmol/L (ref 135–145)

## 2022-07-26 LAB — CBC
HCT: 34.1 % — ABNORMAL LOW (ref 36.0–46.0)
Hemoglobin: 10.9 g/dL — ABNORMAL LOW (ref 12.0–15.0)
MCH: 29 pg (ref 26.0–34.0)
MCHC: 32 g/dL (ref 30.0–36.0)
MCV: 90.7 fL (ref 80.0–100.0)
Platelets: 200 10*3/uL (ref 150–400)
RBC: 3.76 MIL/uL — ABNORMAL LOW (ref 3.87–5.11)
RDW: 16.5 % — ABNORMAL HIGH (ref 11.5–15.5)
WBC: 5.5 10*3/uL (ref 4.0–10.5)
nRBC: 0.5 % — ABNORMAL HIGH (ref 0.0–0.2)

## 2022-07-26 LAB — CBG MONITORING, ED: Glucose-Capillary: 172 mg/dL — ABNORMAL HIGH (ref 70–99)

## 2022-07-26 LAB — GLUCOSE, CAPILLARY: Glucose-Capillary: 214 mg/dL — ABNORMAL HIGH (ref 70–99)

## 2022-07-26 MED ORDER — INSULIN ASPART 100 UNIT/ML IJ SOLN
0.0000 [IU] | Freq: Three times a day (TID) | INTRAMUSCULAR | Status: DC
  Administered 2022-07-27 (×2): 1 [IU] via SUBCUTANEOUS
  Administered 2022-07-27: 3 [IU] via SUBCUTANEOUS
  Administered 2022-07-28: 5 [IU] via SUBCUTANEOUS
  Administered 2022-07-29: 2 [IU] via SUBCUTANEOUS
  Administered 2022-07-29: 1 [IU] via SUBCUTANEOUS

## 2022-07-26 MED ORDER — INSULIN ASPART 100 UNIT/ML IJ SOLN
0.0000 [IU] | Freq: Every day | INTRAMUSCULAR | Status: DC
  Administered 2022-07-26: 2 [IU] via SUBCUTANEOUS
  Administered 2022-07-28: 3 [IU] via SUBCUTANEOUS

## 2022-07-26 MED ORDER — ASPIRIN 81 MG PO TBEC
81.0000 mg | DELAYED_RELEASE_TABLET | Freq: Every day | ORAL | Status: DC
  Administered 2022-07-26 – 2022-07-30 (×5): 81 mg via ORAL
  Filled 2022-07-26 (×5): qty 1

## 2022-07-26 MED ORDER — ONDANSETRON HCL 4 MG/2ML IJ SOLN: 4.0000 mg | Freq: Four times a day (QID) | INTRAMUSCULAR | Status: DC | PRN

## 2022-07-26 MED ORDER — ACETAMINOPHEN 325 MG PO TABS
650.0000 mg | ORAL_TABLET | Freq: Four times a day (QID) | ORAL | Status: DC | PRN
  Filled 2022-07-26: qty 2

## 2022-07-26 MED ORDER — EZETIMIBE 10 MG PO TABS
10.0000 mg | ORAL_TABLET | Freq: Every day | ORAL | Status: DC
  Administered 2022-07-26 – 2022-07-30 (×5): 10 mg via ORAL
  Filled 2022-07-26 (×5): qty 1

## 2022-07-26 MED ORDER — FUROSEMIDE 10 MG/ML IJ SOLN
40.0000 mg | Freq: Two times a day (BID) | INTRAMUSCULAR | Status: DC
  Administered 2022-07-26: 40 mg via INTRAVENOUS
  Filled 2022-07-26: qty 4

## 2022-07-26 MED ORDER — ATORVASTATIN CALCIUM 80 MG PO TABS
80.0000 mg | ORAL_TABLET | Freq: Every day | ORAL | Status: DC
  Administered 2022-07-26 – 2022-07-30 (×5): 80 mg via ORAL
  Filled 2022-07-26: qty 2
  Filled 2022-07-26 (×4): qty 1

## 2022-07-26 MED ORDER — ONDANSETRON HCL 4 MG PO TABS: 4.0000 mg | ORAL_TABLET | Freq: Four times a day (QID) | ORAL | Status: DC | PRN

## 2022-07-26 MED ORDER — POLYETHYLENE GLYCOL 3350 17 G PO PACK
17.0000 g | PACK | Freq: Every day | ORAL | Status: DC | PRN
  Administered 2022-07-29: 17 g via ORAL
  Filled 2022-07-26: qty 1

## 2022-07-26 MED ORDER — ACETAMINOPHEN 650 MG RE SUPP: 650.0000 mg | Freq: Four times a day (QID) | RECTAL | Status: DC | PRN

## 2022-07-26 MED ORDER — FUROSEMIDE 10 MG/ML IJ SOLN
80.0000 mg | Freq: Once | INTRAMUSCULAR | Status: AC
  Administered 2022-07-26: 80 mg via INTRAVENOUS
  Filled 2022-07-26: qty 8

## 2022-07-26 MED ORDER — HEPARIN SODIUM (PORCINE) 5000 UNIT/ML IJ SOLN
5000.0000 [IU] | Freq: Three times a day (TID) | INTRAMUSCULAR | Status: DC
  Administered 2022-07-26 – 2022-07-30 (×12): 5000 [IU] via SUBCUTANEOUS
  Filled 2022-07-26 (×12): qty 1

## 2022-07-26 NOTE — H&P (Addendum)
Date: 07/26/2022               Patient Name:  [name] MRN: [id number]  DOB: 01/08/1951 Age / Sex: 72 y.o., female   PCP: Chauncey Mann, DO         Medical Service: Internal Medicine Teaching Service         Attending Physician: Dr. Mercie Eon, MD    First Contact: Dr. Modena Slater, DO Pager: AP [phone]  Second Contact: Champ Mungo, DO Pager: ED [phone]       After Hours (After 5p/  First Contact Pager: [phone]  weekends / holidays): Second Contact Pager: [phone]   SUBJECTIVE   Chief Complaint: Shortness of Breath  History of Present Illness: [name] is a 72 yo female with PMH of HRrEF (previous echo 06/05/2022 showing EF of less than 20% with severe global hypokinesis and mitral valve regurgitation), CKD4, DM2, MI who presents to the ED for worsening SOB and leg swelling. Her SOB has been constant and worsening over the past month. It is worse when she is laying down and she sleeps on 2 pillows. The leg swelling has been present for several months. She reports that she has not taken any of her medications over the past month. She still has all of her medication and cannot give a reason for not taking them. She also endorses a cough, weight loss, loss of appetite, nausea and vomiting, and chills at night. She denies chest pain.  She does report having night sweats. She states the night sweats have been going on for a while now, but unable to quantify.  She reports that her shortness of breath has been progressive, and now occurs at rest.  At times, her legs are swollen, and then improve, but they have persistently been swollen now.  Daughter and grandson at bedside who states that the patient does not really adhere to a fluid restricted diet, and eats what ever she wants.  ED Course: Arrived to ED on 07/26/2022 with worsening SOB and bilateral LE edema. ECG showed normal sinus rhythm with left axis deviation. CXR showed bilateral pleural effusions with associated bibasilar  opacities, likely atelectasis. BMP, CBC, BNP, troponin ordered. 80 mg IV furosemide given. Admitted to IM service.  Meds:  ASA 81 mg daily Atorvastatin 80 mg daily Calcium carbonate 1500 mg daily Carvedilol 25 mg BID Jardiance 25 mg daily Zetia 10 mg daily Furosemide 40 mg daily Metolazone 5 mg daily as needed for swelling Sacubitril-Valsartan 24-26 mg BID Semaglutide 0.5 mg injection weekly   Past Medical History:  Diagnosis Date   Acute exacerbation of CHF (congestive heart failure) (HCC) 09/12/2017   Acute gallstone pancreatitis    Acute gallstone pancreatitis    Acute on chronic systolic heart failure (HCC)    Bilateral pleural effusion 02/22/2014   Cardiomyopathy, ischemic, EF 25-30% 03/23/2014   03/01/2014- Date of procedure- CABG- X5- Severe 3-vessel coronary artery disease with severe LV dysfunction.  Ejection fraction 25-30%.  SURGICAL PROCEDURE:  Coronary artery bypass grafting x 5. SURGEON:  Sheliah Plane, MD.      Cardiomyopathy, ischemic, EF 25-30% 03/23/2014   03/01/2014- Date of procedure- CABG- X5- Severe 3-vessel coronary artery disease with severe LV dysfunction.  Ejection fraction 25-30%.  SURGICAL PROCEDURE:  Coronary artery bypass grafting x 5. SURGEON:  Sheliah Plane, MD.      Chronic systolic heart failure [facility])    Coronary artery disease involving native coronary artery of native heart without angina pectoris  Diabetes mellitus 1998   Dx in 1998. Microalbuminuria, never on insulin.   Diabetes mellitus type 2, controlled, without complications (HCC) 12/05/2005   Last A1C 6.7 (03/21/17)    On Diet Control Only Last Foot Exam: 03/21/17    Last eye exam: to be scheduled early 2019    Dyspnea    Essential hypertension 12/05/2005   Lisinopril 40mg  Daily, Coreg 25mg  BID.    GERD 12/05/2005   Qualifier: Diagnosis of  By: Randon Goldsmith MD, Cheree Ditto     GERD (gastroesophageal reflux disease)    History of blurry vision 06/09   Hospitalized for this   Hyperlipidemia     Hypertension    Non-intractable vomiting 12/31/2017   Personal history of colonic adenomas 10/07/2007   Pneumonia 01/2014   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   Viral URI with cough 03/21/2017    Past Surgical History:  Procedure Laterality Date   CHOLECYSTECTOMY N/A 08/06/2016   Procedure: LAPAROSCOPIC CHOLECYSTECTOMY WITH INTRAOPERATIVE CHOLANGIOGRAM;  Surgeon: Violeta Gelinas, MD;  Location: [facility] OR;  Service: General;  Laterality: N/A;   COLONOSCOPY WITH PROPOFOL N/A 05/22/2022   Procedure: COLONOSCOPY WITH PROPOFOL;  Surgeon: Vida Rigger, MD;  Location: [facility] ENDOSCOPY;  Service: Gastroenterology;  Laterality: N/A;   CORONARY ARTERY BYPASS GRAFT N/A 03/01/2014   Procedure: CORONARY ARTERY BYPASS GRAFTING (CABG) x five, using left internal mammary artery and right leg greater saphenous vein harvested endoscopically;  Surgeon: Delight Ovens, MD;  Location: [facility] OR;  Service: Open Heart Surgery;  Laterality: N/A;   INTRAOPERATIVE TRANSESOPHAGEAL ECHOCARDIOGRAM N/A 03/01/2014   Procedure: INTRAOPERATIVE TRANSESOPHAGEAL ECHOCARDIOGRAM;  Surgeon: Delight Ovens, MD;  Location[facility] OR;  Service: Open Heart Surgery;  Laterality: N/A;   LEFT AND RIGHT HEART CATHETERIZATION WITH CORONARY ANGIOGRAM N/A 02/26/2014   Procedure: LEFT AND RIGHT HEART CATHETERIZATION WITH CORONARY ANGIOGRAM;  Surgeon: Lesleigh Noe, MD;  Location: [facility] CATH LAB;  Service: Cardiovascular;  Laterality: N/A;   POLYPECTOMY  05/22/2022   Procedure: POLYPECTOMY;  Surgeon: Vida Rigger, MD;  Location: [facility] ENDOSCOPY;  Service: Gastroenterology;;   TOTAL ABDOMINAL HYSTERECTOMY W/ BILATERAL SALPINGOOPHORECTOMY  1996    Social:  Lives With: Daughter and Lucila [place] in an apartment Occupation: None Support: Daughter and grandson help with meds, food, IADLs Level of Function: Can take care of ADLs but not IADLs, ambulates without assistance PCP: [facility] Substances: None  Family History:  Mom: ESRD on HD Son: ESRD on HD Sister: ESRD on HD Maternal  uncle: heart disease  Allergies: Allergies as of 07/26/2022   (No Known Allergies)    Review of Systems: A complete ROS was negative except as per HPI.   OBJECTIVE:   Physical Exam: Blood pressure 115/72, pulse 90, temperature 98 F (36.7 C), resp. rate 17, height 5\' 2"  (1.575 m), weight 79.4 kg, SpO2 98 %.  Constitutional: well-appearing, sitting up in bed, in no acute distress HENT: normocephalic atraumatic, mucous membranes moist Eyes: conjunctiva non-erythematous Cardiovascular: regular rate and rhythm, no m/r/g, JVD to mastoid Pulmonary/Chest: normal WOB, crackles in bilateral lung bases Abdominal: soft, non-tender, non-distended Skin: warm and dry Ext: 3+ pitting edema to tibial plateau Psych: normal mood and affect  Labs: CBC    Component Value Date/Time   WBC 5.5 07/26/2022 1302   RBC 3.76 (L) 07/26/2022 1302   HGB 10.9 (L) 07/26/2022 1302   HGB 8.4 (L) 04/03/2018 1356   HCT 34.1 (L) 07/26/2022 1302   HCT 25.1 (L) 04/03/2018 1356   PLT 200 07/26/2022 1302  PLT 175 04/03/2018 1356   MCV 90.7 07/26/2022 1302   MCV 84 04/03/2018 1356   MCH 29.0 07/26/2022 1302   MCHC 32.0 07/26/2022 1302   RDW 16.5 (H) 07/26/2022 1302   RDW 16.0 (H) 04/03/2018 1356   LYMPHSABS 2.5 09/12/2017 1634   MONOABS 0.3 09/12/2017 1634   EOSABS 0.1 09/12/2017 1634   BASOSABS 0.0 09/12/2017 1634     CMP     Component Value Date/Time   NA 136 07/26/2022 1302   NA 140 05/14/2022 1351   K 4.7 07/26/2022 1302   CL 101 07/26/2022 1302   CO2 19 (L) 07/26/2022 1302   GLUCOSE 220 (H) 07/26/2022 1302   BUN 39 (H) 07/26/2022 1302   BUN 51 (H) 05/14/2022 1351   CREATININE 2.27 (H) 07/26/2022 1302   CREATININE 2.40 (H) 03/26/2019 1049   CALCIUM 9.4 07/26/2022 1302   PROT 7.5 04/09/2022 1443   ALBUMIN 3.9 04/09/2022 1443   AST 19 04/09/2022 1443   ALT 13 04/09/2022 1443   ALKPHOS 118 04/09/2022 1443   BILITOT 0.4 04/09/2022 1443   GFRNONAA 22 (L) 07/26/2022 1302   GFRNONAA 20 (L)  03/26/2019 1049   GFRAA 32 (L) 09/24/2019 1330   GFRAA 23 (L) 03/26/2019 1049    Imaging: DG Chest 2 View  Result Date: 07/26/2022 CLINICAL DATA:  Shortness of breath EXAM: CHEST - 2 VIEW COMPARISON:  11/20/2019 FINDINGS: Heart size is upper limits of normal. Prior sternotomy and CABG. Aortic atherosclerosis. Small bilateral pleural effusions with associated mild bibasilar opacities. Linear opacities in the bilateral perihilar regions, likely atelectasis or scarring. No pneumothorax. IMPRESSION: Small bilateral pleural effusions with associated mild bibasilar opacities, likely atelectasis. Electronically Signed   By: Duanne Guess D.O.   On: 07/26/2022 13:57    EKG: personally reviewed my interpretation is normal sinus rhythm with widened QRS complex consistent with LBBB.   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 with pertinent PMH of HFrEF, DM2, HLD, prior MI who presented with SOB and edema and admitted for heart failure exacerbation on hospital day 0  #Acute exacerbation of CHF #Ischemic cardiomyopathy #History of CAD Worsening SOB and lower extremity edema with PMH of HFrEF and BNP of 2938 is consistent with heart failure exacerbation. This is likely due to her abstinence from medications over the past month. Evaluate depression as a possible reason for abrupt medication cessation. Troponin was flat so an infarct is an unlikely source of exacerbation. Monitor heart with telemetry for any arrhythmias. She had an echocardiogram last month that showed EF <20%. I do not think it is necessary to repeat an echo at this time. XR showed bilateral pleural effusions with associated bibasilar  opacities, likely atelectasis. Give furosemide 40 mg IV twice daily and restrict fluid intake to diurese fluids. Monitor I/Os. Ordering PT/OT during hospital stay. Patient was breathing comfortably on RA with O2 sat 98% so is unlikely to require supplemental oxygen. -Start Lasix 40 mg IV twice daily -Strict I's and O's -Daily weights -Hold GDMT -Goal O2 saturation greater than 92% -Address concern for depression to see if this is likelihood of why patient is not taking meds -Resume home aspirin 81 mg daily -Resume  home atorvastatin 80 mg daily  #Stage 4 CKD Patient has hx of stage 4 CKD. Today has creatinine of 2.27 and BUN of 39 with eGFR of 22. Unclear at this is baseline kidney function or a cardiorenal syndrome. Continue monitoring metabolic panel for improvements in kidney function with diuresis. -CMP in a.m.  #Type II Diabetes Mellitus Glucose was 220 today. Patient is prescribed semeglutide and jardiance but has not had medications in the past month. Last A1c was 11.1 in Dec 2023. Rechecking A1c in the morning. Continue to monitor blood glucose with CBGs and give insulin aspart. -Follow-up A1c in a.m. -Sliding scale insulin -Continue to monitor glucose  #Hypertension Blood pressure currently measuring at 127/86.  Will continue to monitor while inpatient.  Will be diuresing patient, do anticipate some drop in blood pressure.  Will hold GDMT at this time. -Hold home Coreg 25 mg twice daily -Hold home Entresto 24-26 mg twice daily  #Hyperlipidemia Resume home atorvastatin 80 mg daily -No acute concerns  Diet: Normal VTE: heparin IVF: None,None Code: DNR  Prior to Admission Living Arrangement: Home, living with daughter and grandson Anticipated Discharge Location: Home Barriers to Discharge: N/A  Dispo: Admit patient to Inpatient with expected length of stay greater than 2 midnights.  Signed: Randell Patient MS3 Pager: [phone] 07/26/2022, 5:52 PM     I have seen and  examined the patient myself, and I have reviewed the note by Randell Patient, MS3  and was present during the interview and physical exam.  Please see my separate H&P for additional findings, assessment, and plan.  Signed: Modena Slater, DO PGY-1 07/26/2022, 5:52 PM   [phone]

## 2022-07-26 NOTE — Hospital Course (Addendum)
    Fluid build up in her legs Says last that it was this bad was 3-4 years ago [name], some palpitations. [name] is all the time but recently it just got worse. No chest pain Wasn't taking any meds for the last month, "just wasn't" Feels terrible right now, everything is terrible But no pain Trouble with sleeping. Has been sitting up to sleep. 2 pillows at night which has not changed. not every night that she requires this. Not currently checking weight at home.  Has had night sweats every once in a while for last 1.5 months Feels like she is losing weight over this time too Appetite is decreased in this time and she is eating less as a result. Doesn't feel hungry, when she eats she throws up but vomiting has been happening for some time now (at least 1y) Last colonoscopy was in April, doesn't think anything was abnormal. 1 polyp. Leg swelling has been progressing to this point over last several months--maybe close to last 6 months She fell about 3 months ago  PMH Hld Cad s/p stents Hf Stage 4 ckd Mi cabg 2016  psh Cholecystectomy Hysterectomy Stents in heart Cataract surgery?  meds ASA 81 mg daily Atorvastatin 80 mg daily Calcium carbonate 1500 mg daily Carvedilol 25 mg BID Jardiance 25 mg daily Zetia 10 mg daily Furosemide 40 mg daily Metolazone 5 mg daily as needed for swelling Sacubitril-Valsartan 24-26 mg BID Semaglutide 0.5 mg injection weekly  Allergies nkda  Sochx Lives in an apartment with her daughter and grandson who are present at bedside. No tobacco, alcohol, or illicit substance use. PCP is with [facility]. Can take care of ADLs but not IADLs. Ambulates without assistance.  Famhx M: ESRD on HD Son: ESRD on HD Sister: ESRD on HD Maternal uncle: heart disease  06/28: Patient reports that she is doing well. She does not have much this morning. She states that her breathing is doing well. Patient does note that she has not had any mood changes. She is not  worried about the number of meds and she is willing to take her medications when she is ready to go home. She denies any concern about cost of medications.  She is not worried about any barriers to getting her medications and she notes that she just not wanting to taker her medications. We did encourage her to be compliant with her medications. Explained the plan to the patient and answered all questions.   06/29: Patient reports breathing well with no chest pain. Reports good output, voiding frequently. Reports improvement in LE edema.  ***Geriatric assessment with Dr. Mayford Knife at [facility] ***  6/30: Patient states did not sleep well due to constipation. Voiding frequently and states increased output with IV diuresis. Improved with shortness of breath. Leg swelling has improved. Appetite is well.  ***Added senokot daily***

## 2022-07-26 NOTE — ED Provider Notes (Signed)
[place] EMERGENCY DEPARTMENT AT [facility] Provider Note   CSN: [id number] Arrival date & time: 07/26/22  1252     History  Chief Complaint  Patient presents with   Shortness of Breath   Palpitations    [name] is a 72 y.o. female.   Shortness of Breath Palpitations Associated symptoms: shortness of breath   Patient with shortness of breath palpitations.  Worse for the last few days.  Worse at rest.  States she cannot sleep flat due to the trouble breathing.  More swelling on her legs.  Has not been taking Lasix for around a month.  Reportedly does not think it works.  History of ischemic cardiomyopathy.    Past Medical History:  Diagnosis Date   Acute exacerbation of CHF (congestive heart failure) (HCC) 09/12/2017   Acute gallstone pancreatitis    Acute gallstone pancreatitis    Acute on chronic systolic heart failure (HCC)    Bilateral pleural effusion 02/22/2014   Cardiomyopathy, ischemic, EF 25-30% 03/23/2014   03/01/2014- Date of procedure- CABG- X5- Severe 3-vessel coronary artery disease with severe LV dysfunction.  Ejection fraction 25-30%.  SURGICAL PROCEDURE:  Coronary artery bypass grafting x 5. SURGEON:  Sheliah Plane, MD.      Cardiomyopathy, ischemic, EF 25-30% 03/23/2014   03/01/2014- Date of procedure- CABG- X5- Severe 3-vessel coronary artery disease with severe LV dysfunction.  Ejection fraction 25-30%.  SURGICAL PROCEDURE:  Coronary artery bypass grafting x 5. SURGEON:  Sheliah Plane, MD.      Chronic systolic heart failure [facility])    Coronary artery disease involving native coronary artery of native heart without angina pectoris    Diabetes mellitus 1998   Dx in 1998. Microalbuminuria, never on insulin.   Diabetes mellitus type 2, controlled, without complications (HCC) 12/05/2005   Last A1C 6.7 (03/21/17)    On Diet Control Only Last Foot Exam: 03/21/17    Last eye exam: to be scheduled early 2019    Dyspnea    Essential hypertension  12/05/2005   Lisinopril 40mg  Daily, Coreg 25mg  BID.    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   Viral URI with cough 03/21/2017    Home Medications Prior to Admission medications   Medication Sig Start Date End Date Taking? Authorizing Provider  aspirin 81 MG tablet Take 1 tablet (81 mg total) by mouth daily. 10/17/15   Lewayne Bunting, MD  atorvastatin (LIPITOR) 80 MG tablet TAKE 1 TABLET BY MOUTH DAILY 06/20/22   Lewayne Bunting, MD  carvedilol (COREG) 25 MG tablet TAKE 1 TABLET BY MOUTH TWICE A DAY WITH A MEAL 03/21/22   [place], Peter M, MD  empagliflozin (JARDIANCE) 25 MG TABS tablet TAKE 1 TABLET BY MOUTH DAILY 06/26/22   Doran Stabler, DO  ezetimibe (ZETIA) 10 MG tablet TAKE ONE TABLET BY MOUTH DAILY 11/13/21   Lewayne Bunting, MD  furosemide (LASIX) 80 MG tablet Take 0.5 tablets (40 mg total) by mouth daily. Patient taking differently: Take 80 mg by mouth 2 (two) times daily. 04/10/22   Lewayne Bunting, MD  Insulin Pen Needle (PEN NEEDLES) 32G X 4 MM MISC 1  each by Does not apply route as directed. 03/28/22   Atway, Rayann N, DO  sacubitril-valsartan (ENTRESTO) 24-26 MG Take 1 tablet by mouth 2 (two) times daily. 10/24/21   Lewayne Bunting, MD  Semaglutide,0.25 or 0.5MG /DOS, 2 MG/3ML SOPN Inject 0.5 mg into the skin once a week. 03/15/22   Chauncey Mann, DO      Allergies    Patient has no known allergies.    Review of Systems   Review of Systems  Respiratory:  Positive for shortness of breath.   Cardiovascular:  Positive for palpitations.    Physical Exam Updated Vital Signs BP (!) 155/93 (BP Location: Right Arm)   Pulse (!) 103   Temp 98 F (36.7 C)   Resp 16   Ht 5\' 2"  (1.575  m)   Wt 79.4 kg   SpO2 96%   BMI 32.01 kg/m  Physical Exam Vitals and nursing note reviewed.  Pulmonary:     Comments: Mildly harsh breath sounds without severe respiratory distress. Chest:     Chest wall: No tenderness.  Musculoskeletal:     Cervical back: Neck supple.     Right lower leg: Edema present.     Left lower leg: Edema present.     Comments: Moderate to severe pitting edema bilateral lower extremities.  Neurological:     Mental Status: She is alert.     ED Results / Procedures / Treatments   Labs (all labs ordered are listed, but only abnormal results are displayed) Labs Reviewed  BASIC METABOLIC PANEL - Abnormal; Notable for the following components:      Result Value   CO2 19 (*)    Glucose, Bld 220 (*)    BUN 39 (*)    Creatinine, Ser 2.27 (*)    GFR, Estimated 22 (*)    Anion gap 16 (*)    All other components within normal limits  CBC - Abnormal; Notable for the following components:   RBC 3.76 (*)    Hemoglobin 10.9 (*)    HCT 34.1 (*)    RDW 16.5 (*)    nRBC 0.5 (*)    All other components within normal limits  BRAIN NATRIURETIC PEPTIDE - Abnormal; Notable for the following components:   B Natriuretic Peptide 2,937.9 (*)    All other components within normal limits  TROPONIN I (HIGH SENSITIVITY) - Abnormal; Notable for the following components:   Troponin I (High Sensitivity) 27 (*)    All other components within normal limits  TROPONIN I (HIGH SENSITIVITY)    EKG EKG Interpretation Date/Time:  Thursday July 26 2022 12:59:54 EDT Ventricular Rate:  100 PR Interval:  172 QRS Duration:  158 QT Interval:  414 QTC Calculation: 534 R Axis:   -84  Text Interpretation: Normal sinus rhythm Left axis deviation Left bundle branch block Abnormal ECG When compared with ECG of 24-Oct-2019 15:49,  rate mildly increased Confirmed by Benjiman Core [phone]) on 07/26/2022 2:53:21 PM  Radiology DG Chest 2 View  Result Date: 07/26/2022 CLINICAL DATA:   Shortness of breath EXAM: CHEST - 2 VIEW COMPARISON:  11/20/2019 FINDINGS: Heart size is upper limits of normal. Prior sternotomy and CABG. Aortic atherosclerosis. Small bilateral pleural effusions with associated mild bibasilar opacities. Linear opacities in the bilateral perihilar regions, likely atelectasis or scarring. No pneumothorax. IMPRESSION: Small bilateral pleural effusions with associated mild bibasilar opacities, likely atelectasis. Electronically Signed   By: Duanne Guess D.O.   On: 07/26/2022 13:57    Procedures  Procedures    Medications Ordered in ED Medications  furosemide (LASIX) injection 80 mg (has no administration in time range)    ED Course/ Medical Decision Making/ A&P                             Medical Decision Making Amount and/or Complexity of Data Reviewed Labs: ordered. Radiology: ordered.  Risk Prescription drug management.   Patient shortness of breath.  History of cardiomyopathy and CHF.  Has not been compliant with medications.  Differential diagnosis includes volume overload, CHF, severe respiratory distress.  X-ray shows mild atelectasis, has chronic kidney disease.  However BNP is almost 3000.  Moderate to severe edema.  I think with this amount of edema and the amount of symptoms she is having and that she cannot really sleep or lay flat she benefit from mission to the hospital.  Reviewed previous cardiology note.  Will discuss with internal medicine residents, who is the patient's PCP.         Final Clinical Impression(s) / ED Diagnoses Final diagnoses:  Acute on chronic congestive heart failure, unspecified heart failure type [facility])    Rx / DC Orders ED Discharge Orders     None         Benjiman Core, MD 07/26/22 1458

## 2022-07-26 NOTE — ED Triage Notes (Signed)
Patient with hx of CHF presents for several days of SOB, even while at rest, and heart palpitations. Reports not taking her Lasix for the last several days because she doesn't feel like it works, though does report that she is more sob when she does not take the Lasix.

## 2022-07-26 NOTE — ED Notes (Signed)
ED TO INPATIENT HANDOFF REPORT  ED Nurse Name and Phone #: Babs Dabbs [phone]  S Name/Age/Gender [name] 72 y.o. female Room/Bed: 031C/031C  Code Status   Code Status: DNR  Home/SNF/Other Home Patient oriented to: self, place, time, and situation Is this baseline? Yes   Triage Complete: Triage complete  Chief Complaint Acute exacerbation of CHF (congestive heart failure) (HCC) [I50.9]  Triage Note Patient with hx of CHF presents for several days of SOB, even while at rest, and heart palpitations. Reports not taking her Lasix for the last several days because she doesn't feel like it works, though does report that she is more sob when she does not take the Lasix.     Allergies No Known Allergies  Level of Care/Admitting Diagnosis ED Disposition     ED Disposition  Admit   Condition  --   Comment  Hospital Area: [facility] [[id number]]  Level of Care: Telemetry Medical [104]  May admit patient to [facility] or [facility] if equivalent level of care is available:: No  Covid Evaluation: Asymptomatic - no recent exposure (last 10 days) testing not required  Diagnosis: Acute exacerbation of CHF (congestive heart failure) [facility]) [[id number]]  Admitting Physician: Mercie Eon [[id number]]  Attending Physician: Mercie Eon [[id number]]  Certification:: I certify this patient will need inpatient services for at least 2 midnights  Estimated Length of Stay: 4          B Medical/Surgery History Past Medical History:  Diagnosis Date   Acute exacerbation of CHF (congestive heart failure) (HCC) 09/12/2017   Acute gallstone pancreatitis    Acute gallstone pancreatitis    Acute on chronic systolic heart failure (HCC)    Bilateral pleural effusion 02/22/2014   Cardiomyopathy, ischemic, EF 25-30% 03/23/2014   03/01/2014- Date of procedure- CABG- X5- Severe 3-vessel coronary artery disease with severe LV dysfunction.  Ejection fraction 25-30%.  SURGICAL PROCEDURE:   Coronary artery bypass grafting x 5. SURGEON:  Sheliah Plane, MD.      Cardiomyopathy, ischemic, EF 25-30% 03/23/2014   03/01/2014- Date of procedure- CABG- X5- Severe 3-vessel coronary artery disease with severe LV dysfunction.  Ejection fraction 25-30%.  SURGICAL PROCEDURE:  Coronary artery bypass grafting x 5. SURGEON:  Sheliah Plane, MD.      Chronic systolic heart failure [facility])    Coronary artery disease involving native coronary artery of native heart without angina pectoris    Diabetes mellitus 1998   Dx in 1998. Microalbuminuria, never on insulin.   Diabetes mellitus type 2, controlled, without complications (HCC) 12/05/2005   Last A1C 6.7 (03/21/17)    On Diet Control Only Last Foot Exam: 03/21/17    Last eye exam: to be scheduled early 2019    Dyspnea    Essential hypertension 12/05/2005   Lisinopril 40mg  Daily, Coreg 25mg  BID.    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   Viral URI with cough 03/21/2017   Past Surgical History:  Procedure Laterality Date   CHOLECYSTECTOMY N/A 08/06/2016   Procedure: LAPAROSCOPIC CHOLECYSTECTOMY WITH INTRAOPERATIVE CHOLANGIOGRAM;  Surgeon: Violeta Gelinas, MD;  Location: [facility] OR;  Service: General;  Laterality: N/A;  COLONOSCOPY WITH PROPOFOL N/A 05/22/2022   Procedure: COLONOSCOPY WITH PROPOFOL;  Surgeon: Vida Rigger, MD;  Location: [facility] ENDOSCOPY;  Service: Gastroenterology;  Laterality: N/A;   CORONARY ARTERY BYPASS GRAFT N/A 03/01/2014   Procedure: CORONARY ARTERY BYPASS GRAFTING (CABG) x five, using left internal mammary artery and right leg greater saphenous vein harvested endoscopically;  Surgeon: Delight Ovens, MD;  Location: [facility] OR;  Service: Open Heart  Surgery;  Laterality: N/A;   INTRAOPERATIVE TRANSESOPHAGEAL ECHOCARDIOGRAM N/A 03/01/2014   Procedure: INTRAOPERATIVE TRANSESOPHAGEAL ECHOCARDIOGRAM;  Surgeon: Delight Ovens, MD;  Location: [facility] OR;  Service: Open Heart Surgery;  Laterality: N/A;   LEFT AND RIGHT HEART CATHETERIZATION WITH CORONARY ANGIOGRAM N/A 02/26/2014   Procedure: LEFT AND RIGHT HEART CATHETERIZATION WITH CORONARY ANGIOGRAM;  Surgeon: Lesleigh Noe, MD;  Location: [facility] CATH LAB;  Service: Cardiovascular;  Laterality: N/A;   POLYPECTOMY  05/22/2022   Procedure: POLYPECTOMY;  Surgeon: Vida Rigger, MD;  Location: [facility] ENDOSCOPY;  Service: Gastroenterology;;   TOTAL ABDOMINAL HYSTERECTOMY W/ BILATERAL SALPINGOOPHORECTOMY  1996     A IV Location/Drains/Wounds Patient Lines/Drains/Airways Status     Active Line/Drains/Airways     Name Placement date Placement time Site Days   Peripheral IV 07/26/22 20 G Left Antecubital 07/26/22  1506  Antecubital  less than 1   Incision - 4 Ports Abdomen 1: Umbilicus 2: Mid;Upper 3: Right;Medial 4: Right;Lateral 08/06/16  1345  -- 2180            Intake/Output Last 24 hours No intake or output data in the 24 hours ending 07/26/22 1625  Labs/Imaging Results for orders placed or performed during the hospital encounter of 07/26/22 (from the past 48 hour(s))  Basic metabolic panel     Status: Abnormal   Collection Time: 07/26/22  1:02 PM  Result Value Ref Range   Sodium 136 135 - 145 mmol/L   Potassium 4.7 3.5 - 5.1 mmol/L   Chloride 101 98 - 111 mmol/L   CO2 19 (L) 22 - 32 mmol/L   Glucose, Bld 220 (H) 70 - 99 mg/dL    Comment: Glucose reference range applies only to samples taken after fasting for at least 8 hours.   BUN 39 (H) 8 - 23 mg/dL   Creatinine, Ser 8.29 (H) 0.44 - 1.00 mg/dL   Calcium 9.4 8.9 - 56.2 mg/dL   GFR, Estimated 22 (L) >60 mL/min    Comment: (NOTE) Calculated using the CKD-EPI Creatinine Equation (2021)    Anion gap 16 (H) 5 - 15    Comment: Performed  at [facility], [address] [place], [place] [address]  CBC     Status: Abnormal   Collection Time: 07/26/22  1:02 PM  Result Value Ref Range   WBC 5.5 4.0 - 10.5 K/uL   RBC 3.76 (L) 3.87 - 5.11 MIL/uL   Hemoglobin 10.9 (L) 12.0 - 15.0 g/dL   HCT 57.8 (L) 46.9 - 62.9 %   MCV 90.7 80.0 - 100.0 fL   MCH 29.0 26.0 - 34.0 pg   MCHC 32.0 30.0 - 36.0 g/dL   RDW 52.8 (H) 41.3 - 24.4 %   Platelets 200 150 - 400 K/uL   nRBC 0.5 (H) 0.0 - 0.2 %    Comment: Performed at [facility], [address] [place], [place] [address]  Brain natriuretic peptide     Status: Abnormal   Collection Time: 07/26/22  1:02 PM  Result Value Ref Range   B  Natriuretic Peptide 2,937.9 (H) 0.0 - 100.0 pg/mL    Comment: Performed at [facility], [address], [place], [place] [address]  Troponin I (High Sensitivity)     Status: Abnormal   Collection Time: 07/26/22  1:02 PM  Result Value Ref Range   Troponin I (High Sensitivity) 27 (H) <18 ng/L    Comment: (NOTE) Elevated high sensitivity troponin I (hsTnI) values and significant  changes across serial measurements may suggest ACS but many other  chronic and acute conditions are known to elevate hsTnI results.  Refer to the "Links" section for chest pain algorithms and additional  guidance. Performed at [facility], [address], [place], [place] [address]   Troponin I (High Sensitivity)     Status: Abnormal   Collection Time: 07/26/22  3:02 PM  Result Value Ref Range   Troponin I (High Sensitivity) 29 (H) <18 ng/L    Comment: (NOTE) Elevated high sensitivity troponin I (hsTnI) values and significant  changes across serial measurements may suggest ACS but many other  chronic and acute conditions are known to elevate hsTnI results.  Refer to the "Links" section for chest pain algorithms and additional  guidance. Performed at [facility], [address], [place], [place] [address]    DG Chest 2 View  Result Date:  07/26/2022 CLINICAL DATA:  Shortness of breath EXAM: CHEST - 2 VIEW COMPARISON:  11/20/2019 FINDINGS: Heart size is upper limits of normal. Prior sternotomy and CABG. Aortic atherosclerosis. Small bilateral pleural effusions with associated mild bibasilar opacities. Linear opacities in the bilateral perihilar regions, likely atelectasis or scarring. No pneumothorax. IMPRESSION: Small bilateral pleural effusions with associated mild bibasilar opacities, likely atelectasis. Electronically Signed   By: Duanne Guess D.O.   On: 07/26/2022 13:57    Pending Labs Unresulted Labs (From admission, onward)     Start     Ordered   07/27/22 0500  CBC  Tomorrow morning,   R        07/26/22 1607   07/27/22 0500  Hemoglobin A1c  Tomorrow morning,   R        07/26/22 1614   07/27/22 0500  Comprehensive metabolic panel  Tomorrow morning,   R        07/26/22 1619            Vitals/Pain Today's Vitals   07/26/22 1257 07/26/22 1259 07/26/22 1422 07/26/22 1430  BP: (!) 155/93   115/72  Pulse: (!) 103   90  Resp: 16   17  Temp: 98 F (36.7 C)     SpO2: 96%   98%  Weight:   79.4 kg   Height:   5\' 2"  (1.575 m)   PainSc:  0-No pain      Isolation Precautions No active isolations  Medications Medications  heparin injection 5,000 Units (has no administration in time range)  acetaminophen (TYLENOL) tablet 650 mg (has no administration in time range)    Or  acetaminophen (TYLENOL) suppository 650 mg (has no administration in time range)  polyethylene glycol (MIRALAX / GLYCOLAX) packet 17 g (has no administration in time range)  ondansetron (ZOFRAN) tablet 4 mg (has no administration in time range)    Or  ondansetron (ZOFRAN) injection 4 mg (has no administration in time range)  aspirin EC tablet 81 mg (has no administration in time range)  atorvastatin (LIPITOR) tablet 80 mg (has no administration in time range)  ezetimibe (ZETIA) tablet 10 mg (has  no administration in time range)  furosemide  (LASIX) injection 40 mg (has no administration in time range)  insulin aspart (novoLOG) injection 0-9 Units (has no administration in time range)  insulin aspart (novoLOG) injection 0-5 Units (has no administration in time range)  furosemide (LASIX) injection 80 mg (80 mg Intravenous Given 07/26/22 1507)    Mobility walks with device     Focused Assessments    R Recommendations: See Admitting Provider Note  Report given to:   Additional Notes:

## 2022-07-26 NOTE — ED Notes (Signed)
Awaiting patient to be roomed from lobby

## 2022-07-26 NOTE — Progress Notes (Signed)
Patient ID: [name], female   DOB: 25-May-1950, 72 y.o.   MRN: [id number]   ED TO INPATIENT HANDOFF REPORT reviewed.   Lidia Collum, RN

## 2022-07-27 ENCOUNTER — Encounter (HOSPITAL_COMMUNITY): Payer: Self-pay | Admitting: Internal Medicine

## 2022-07-27 ENCOUNTER — Other Ambulatory Visit (HOSPITAL_COMMUNITY): Payer: Self-pay

## 2022-07-27 DIAGNOSIS — E1122 Type 2 diabetes mellitus with diabetic chronic kidney disease: Secondary | ICD-10-CM

## 2022-07-27 DIAGNOSIS — I5023 Acute on chronic systolic (congestive) heart failure: Secondary | ICD-10-CM

## 2022-07-27 DIAGNOSIS — I130 Hypertensive heart and chronic kidney disease with heart failure and stage 1 through stage 4 chronic kidney disease, or unspecified chronic kidney disease: Secondary | ICD-10-CM

## 2022-07-27 DIAGNOSIS — N184 Chronic kidney disease, stage 4 (severe): Secondary | ICD-10-CM

## 2022-07-27 DIAGNOSIS — Z91148 Patient's other noncompliance with medication regimen for other reason: Secondary | ICD-10-CM

## 2022-07-27 DIAGNOSIS — I428 Other cardiomyopathies: Secondary | ICD-10-CM

## 2022-07-27 DIAGNOSIS — E785 Hyperlipidemia, unspecified: Secondary | ICD-10-CM

## 2022-07-27 DIAGNOSIS — Z794 Long term (current) use of insulin: Secondary | ICD-10-CM

## 2022-07-27 LAB — GLUCOSE, CAPILLARY
Glucose-Capillary: 123 mg/dL — ABNORMAL HIGH (ref 70–99)
Glucose-Capillary: 202 mg/dL — ABNORMAL HIGH (ref 70–99)
Glucose-Capillary: 142 mg/dL — ABNORMAL HIGH (ref 70–99)
Glucose-Capillary: 152 mg/dL — ABNORMAL HIGH (ref 70–99)

## 2022-07-27 LAB — COMPREHENSIVE METABOLIC PANEL
ALT: 70 U/L — ABNORMAL HIGH (ref 0–44)
AST: 54 U/L — ABNORMAL HIGH (ref 15–41)
Albumin: 3.1 g/dL — ABNORMAL LOW (ref 3.5–5.0)
Alkaline Phosphatase: 102 U/L (ref 38–126)
Anion gap: 9 (ref 5–15)
BUN: 40 mg/dL — ABNORMAL HIGH (ref 8–23)
CO2: 21 mmol/L — ABNORMAL LOW (ref 22–32)
Calcium: 9.1 mg/dL (ref 8.9–10.3)
Chloride: 104 mmol/L (ref 98–111)
Creatinine, Ser: 2.13 mg/dL — ABNORMAL HIGH (ref 0.44–1.00)
GFR, Estimated: 24 mL/min — ABNORMAL LOW (ref >60)
Glucose, Bld: 141 mg/dL — ABNORMAL HIGH (ref 70–99)
Potassium: 4.2 mmol/L (ref 3.5–5.1)
Sodium: 134 mmol/L — ABNORMAL LOW (ref 135–145)
Total Bilirubin: 0.9 mg/dL (ref 0.3–1.2)
Total Protein: 6.3 g/dL — ABNORMAL LOW (ref 6.5–8.1)

## 2022-07-27 LAB — CBC
HCT: 33 % — ABNORMAL LOW (ref 36.0–46.0)
Hemoglobin: 11.1 g/dL — ABNORMAL LOW (ref 12.0–15.0)
MCH: 29.6 pg (ref 26.0–34.0)
MCHC: 33.6 g/dL (ref 30.0–36.0)
MCV: 88 fL (ref 80.0–100.0)
Platelets: 189 10*3/uL (ref 150–400)
RBC: 3.75 MIL/uL — ABNORMAL LOW (ref 3.87–5.11)
RDW: 16.5 % — ABNORMAL HIGH (ref 11.5–15.5)
WBC: 5.8 10*3/uL (ref 4.0–10.5)
nRBC: 0.5 % — ABNORMAL HIGH (ref 0.0–0.2)

## 2022-07-27 MED ORDER — FUROSEMIDE 10 MG/ML IJ SOLN
40.0000 mg | Freq: Three times a day (TID) | INTRAMUSCULAR | Status: DC
  Administered 2022-07-27 – 2022-07-30 (×10): 40 mg via INTRAVENOUS
  Filled 2022-07-27 (×10): qty 4

## 2022-07-27 NOTE — Progress Notes (Signed)
Subjective:  [name] is a 72 yo female with PMH of HFrEF, CKD4, DM2, prior MI who presented to the ED on 07/26/2022 for SOB and lower extremity edema likely due to HF exacerbation after not taking her medication for the past month.  She says she slept well and ate some food. She has been peeing a lot and has not gotten out of bed. She reports that her shortness of breath and her leg swelling has improved. Denies any pain or other concerns. Explored reasons why she was not compliant with her medications for the past month. She denies any mood changes, stressors, barriers to accessing or purchasing her medications. Went over the plan with her to continue diuresing and monitoring labs and she is agreeable. She had no further questions.  Objective:  Vital signs in last 24 hours: Vitals:   07/27/22 0500 07/27/22 0610 07/27/22 0736 07/27/22 1106  BP:   131/76 121/72  Pulse:   99 94  Resp:  (!) 25 18 (!) 25  Temp:   97.8 F (36.6 C) 97.6 F (36.4 C)  TempSrc:   Oral Oral  SpO2:   100% 99%  Weight: 80.4 kg 79.3 kg    Height:       Weight change: -0.1 kg since admission  Intake/Output Summary (Last 24 hours) at 07/27/2022 1407 Last data filed at 07/27/2022 1304 Gross per 24 hour  Intake 417 ml  Output 2500 ml  Net -2083 ml   Physical Exam Gen: alert, well appearing, in no acute distress HEENT: normocephalic, atraumatic CV: RRR, no MRG, JVD present to mastoid Pulm: normal WOB, crackles present in bilateral lung bases Ext: 3+ bilateral lower extremity edema Psych: normal mood and affect  Assessment/Plan:  Principal Problem:   Acute exacerbation of CHF (congestive heart failure) (HCC) Active Problems:   Type 2 diabetes mellitus with stage 4 chronic kidney disease, without long-term current use of insulin (HCC)   Hyperlipidemia   Essential hypertension  #Acute exacerbation of CHF #Ischemic cardiomyopathy #History of CAD Patient reports that her SOB and lower extremity  edema have improved. She is breathing comfortably and O2 is 99% on RA. Her lower extremity edema is still impressive but perhaps a little improved. She is peeing well and -2083 mL I/Os. She received 120 mg of IV lasix yesterday. Continue with same dose of lasix, 40 mg IV lasix tid. As her heart failure exacerbation improves, can restart GDMT. Unable to identify a reason why patient stopped her medications for the past month, but she is agreeable to take her medications as prescribed when discharged. -Continue Lasix 40 mg IV three times per day -Strict I's and O's -Daily weights -Hold GDMTs, consider restarting tomorrow -Continue ASA 81 mg daily and atorvastatin 80 mg daily  #Stage 4 CKD  Patient has hx of CKD and likely has a concurrent cardiorenal syndrome. Her creatinine improved slightly from 2.27 to 2.13 and eGFR from 22 to 24. She is still producing urine well. Will continue to monitor CMP for improvement in kidney function with diuresis. -CMP in morning  #Type II Diabetes Mellitus  Morning capillary glucose was 123 and repeat at 1105 was 202. A1c not collected. Continue to give sliding scale insulin aspart and monitor glucose. Can consider starting back semaglutide and jardiance tomorrow. -Follow up A1c -Sliding scale insulin -Monitor glucose  #Hypertension  BP is well controlled to 121/72. Will continue to monitor BP as diuresing patient and ensure no significant drop. Still holding GDMT at this time. -  Consider restarting Coreg 25 mg bid tomorrow -Consider restarting Entresto 24-26 mg bid tomorrow  #Hyperlipidemia  -Continue atorvastatin 80 mg daily.  #Elevated Liver Enzymes AST was 54 and ALT was 70 this morning. This is likely due to a congestive hepatopathy. Continue to monitor CMP as patient is diuresed. I expect liver enzymes to normalize. If still elevated, consider RUQ ultrasound. -Monitor CMP for liver enzymes -Consider RUQ ultrasound if liver enzymes still elevated after  diuresis.    LOS: 1 day   Barrett Shell, Medical Student 07/27/2022, 2:07 PM

## 2022-07-27 NOTE — TOC Progression Note (Addendum)
Transition of Care [facility]) - Progression Note    Patient Details  Name: [name] MRN: [id number] Date of Birth: 01-Jul-1950  [facility] CM/SW Contact  Leone Haven, RN Phone Number: 07/27/2022, 3:19 PM  Clinical Narrative:    She is set up with [facility], HHPT,  HHOT [employer] to supply rollator.    Expected Discharge Plan: Home w Home Health Services Barriers to Discharge: Continued Medical Work up  Expected Discharge Plan and Services In-house Referral: NA Discharge Planning Services: CM Consult Post Acute Care Choice: Home Health Living arrangements for the past 2 months: Single Family Home                 DME Arranged: Walker rolling with seat DME Agency: [employer] Date DME Agency Contacted: 07/27/22 Time DME Agency Contacted: 1505 Representative spoke with at DME Agency: Vaughan Basta HH Arranged: RN, PT,OT HH Agency: [facility] Date [facility] Agency Contacted: 07/27/22 Time HH Agency Contacted: 1519 Representative spoke with at [facility] Agency: Kandee Keen   Social Determinants of Health (SDOH) Interventions SDOH Screenings   Food Insecurity: No Food Insecurity (07/27/2022)  Housing: Low Risk  (07/27/2022)  Transportation Needs: No Transportation Needs (07/27/2022)  Utilities: Not At Risk (01/10/2022)  Alcohol Screen: Low Risk  (07/27/2022)  Depression (PHQ2-9): Low Risk  (01/10/2022)  Financial Resource Strain: Low Risk  (07/27/2022)  Physical Activity: Inactive (01/10/2022)  Social Connections: Socially Isolated (01/10/2022)  Stress: No Stress Concern Present (01/10/2022)  Tobacco Use: Low Risk  (07/27/2022)    Readmission Risk Interventions     No data to display

## 2022-07-27 NOTE — Progress Notes (Signed)
   Heart Failure Stewardship Pharmacist Progress Note   PCP: Chauncey Mann, DO PCP-Cardiologist: Olga Millers, MD    HPI:  72 yo F with PMH of CHF, T2DM, CAD s/p CABG, and CKD IV.   ECHO 01/2014 with EF 25-30%. Cath revealed 3 vessel disease. S/p CABG. Referred to EP for ICD but patient declined.   Admitted 08/2017 with CHF and diuresed. EF 25%. Discharged on lasix 80 mg daily. Repeat ECHO improved some to 30-35% in 03/2019 and 05/2020. Back down to 25-30% 09/2021.  Last seen by cardiology 03/2022. BP 92/50. Reported chronic mild edema and some dyspnea on exertion. No medication changes made. Repeat ECHO 05/2022 with EF <20%, global hypokinesis, G1DD, RV normal, moderate MR.   Presented to the ED on 6/27 with shortness of breath, LE edema, orthopnea. Reported not taking her lasix for several days as she does not believe it was working. She also admits to not taking any of her medications over the last month, and dietary/fluid noncompliance. She stated that she is not interested in pill boxes. She did not forget to take medications, but chose not to take them. CXR showed bilateral pleural effusions with associated bibasilar atelectasis.   Current HF Medications: Diuretic: furosemide 40 mg IV TID  Prior to admission HF Medications: None Prescribed carvedilol 25 mg BID, Jardiance 25 mg daily, lasix 80 mg BID, metolazone 5 mg PRN, Entresto 24/26 mg BID  Pertinent Lab Values: Serum creatinine 2.13, BUN 40, Potassium 4.2, Sodium 134, BNP 2937.9, A1c pending   Vital Signs: Weight: 174 lbs (admission weight: 175 lbs) Blood pressure: 130/70s  Heart rate: 80-100s  I/O: -1.4L yesterday; net -2.3L  Medication Assistance / Insurance Benefits Check: Does the patient have prescription insurance?  Yes Type of insurance plan: [facility] Medicare  Outpatient Pharmacy:  Prior to admission outpatient [facility] Is the patient willing to use [facility] TOC pharmacy at discharge? Yes Is the patient  willing to transition their outpatient pharmacy to utilize a [facility] outpatient pharmacy?   No    Assessment: 1. Acute on chronic systolic CHF (LVEF <20%), due to ICM, noncompliance. NYHA class III symptoms. - Continue furosemide 40 mg IV TID. Strict I/Os and daily weights. Keep K>4 and Mg>2. Check magnesium in AM. - Holding BB as she has been off of this for about a month. Would resume when more euvolemic. - Consider restarting Entresto if renal function improves. eGFR <30 - A1c pending. Consider restarting Jardiance if able.   Plan: 1) Medication changes recommended at this time: - Continue IV diuresis  2) Patient assistance: Sherryll Burger copay $0 - Jardiance copay $0 - She states she is willing to take medications after discharge  3)  Education  - Initial education completed - Full education to be completed prior to discharge  Sharen Hones, PharmD, BCPS Heart Failure Stewardship Pharmacist Phone [phone]

## 2022-07-27 NOTE — Evaluation (Signed)
Physical Therapy Evaluation Patient Details Name: [name] MRN: [id number] DOB: 1951/01/29 Today's Date: 07/27/2022  History of Present Illness  Pt is 72 year old presented to [facility] on  07/26/22 for SOB. Pt with acute exacerbation of CHF.  PMH - CHF, DM, CAD, CKD, MI, CABG  Clinical Impression  Pt admitted with above diagnosis and presents to PT with functional limitations due to deficits listed below (See PT problem list). Pt needs skilled PT to maximize independence and safety. Pt limited by decr functional activity tolerance. Needs rollator for home to incr activity tolerance and improve strength and mobility. Can also benefit from HHPT.          Recommendations for follow up therapy are one component of a multi-disciplinary discharge planning process, led by the attending physician.  Recommendations may be updated based on patient status, additional functional criteria and insurance authorization.  Follow Up Recommendations       Assistance Recommended at Discharge PRN  Patient can return home with the following  Assistance with cooking/housework    Equipment Recommendations Rollator (4 wheels)  Recommendations for Other Services       Functional Status Assessment Patient has had a recent decline in their functional status and demonstrates the ability to make significant improvements in function in a reasonable and predictable amount of time.     Precautions / Restrictions        Mobility  Bed Mobility Overal bed mobility: Modified Independent                  Transfers Overall transfer level: Needs assistance Equipment used: None Transfers: Sit to/from Stand Sit to Stand: Supervision           General transfer comment: Assist for safety    Ambulation/Gait Ambulation/Gait assistance: Supervision Gait Distance (Feet): 225 Feet Assistive device: Rollator (4 wheels), None Gait Pattern/deviations: Step-through pattern, Decreased stride length Gait  velocity: decr Gait velocity interpretation: 1.31 - 2.62 ft/sec, indicative of limited community ambulator   General Gait Details: Supervision for safety. More confident with use of rollator. 2 standing rest breaks  Stairs            Wheelchair Mobility    Modified Rankin (Stroke Patients Only)       Balance Overall balance assessment: Mild deficits observed, not formally tested                                           Pertinent Vitals/Pain Pain Assessment Pain Assessment: Faces Faces Pain Scale: Hurts little more Pain Location: rt calf Pain Descriptors / Indicators: Cramping Pain Intervention(s): Limited activity within patient's tolerance, Repositioned, Other (comment) (Pt requested mustard packets)    Home Living Family/patient expects to be discharged to:: Private residence Living Arrangements: Children;Other relatives Available Help at Discharge: Family;Available PRN/intermittently Type of Home: Apartment Home Access: Level entry     Alternate Level Stairs-Number of Steps: flight Home Layout: Two level;Bed/bath upstairs Home Equipment: None      Prior Function Prior Level of Function : Independent/Modified Independent                     Hand Dominance        Extremity/Trunk Assessment   Upper Extremity Assessment Upper Extremity Assessment: Defer to OT evaluation    Lower Extremity Assessment Lower Extremity Assessment: Generalized weakness  Communication   Communication: No difficulties  Cognition Arousal/Alertness: Awake/alert Behavior During Therapy: WFL for tasks assessed/performed Overall Cognitive Status: Within Functional Limits for tasks assessed                                          General Comments General comments (skin integrity, edema, etc.): SpO2 97-100% on RA. Dyspnea 2-3/4    Exercises     Assessment/Plan    PT Assessment Patient needs continued PT services  PT  Problem List Decreased strength;Decreased activity tolerance;Decreased mobility;Cardiopulmonary status limiting activity       PT Treatment Interventions DME instruction;Gait training;Stair training;Functional mobility training;Therapeutic activities;Therapeutic exercise;Patient/family education;Balance training    PT Goals (Current goals can be found in the Care Plan section)  Acute Rehab PT Goals Patient Stated Goal: return home PT Goal Formulation: With patient Time For Goal Achievement: 08/10/22 Potential to Achieve Goals: Good    Frequency Min 1X/week     Co-evaluation               AM-PAC PT "6 Clicks" Mobility  Outcome Measure Help needed turning from your back to your side while in a flat bed without using bedrails?: None Help needed moving from lying on your back to sitting on the side of a flat bed without using bedrails?: None Help needed moving to and from a bed to a chair (including a wheelchair)?: A Little Help needed standing up from a chair using your arms (e.g., wheelchair or bedside chair)?: A Little Help needed to walk in hospital room?: A Little Help needed climbing 3-5 steps with a railing? : A Little 6 Click Score: 20    End of Session   Activity Tolerance: Patient tolerated treatment well Patient left: in bed;with call bell/phone within reach   PT Visit Diagnosis: Other abnormalities of gait and mobility (R26.89);Muscle weakness (generalized) (M62.81)    Time: 1610-9604 PT Time Calculation (min) (ACUTE ONLY): 20 min   Charges:   PT Evaluation $PT Eval Low Complexity: 1 Low          [facility] PT Acute Rehabilitation Services Office [phone]   Angelina Ok [facility] 07/27/2022, 3:15 PM

## 2022-07-27 NOTE — Plan of Care (Signed)

## 2022-07-27 NOTE — TOC Initial Note (Addendum)
Transition of Care [facility]) - Initial/Assessment Note    Patient Details  Name: [name] MRN: [id number] Date of Birth: 1950/06/19  [facility] CM/SW Contact:    Leone Haven, RN Phone Number: 07/27/2022, 3:06 PM  Clinical Narrative:                 From home with daughter, she states [facility] is [facility] and she has insurance on file.  She states she has a Engineer, civil (consulting) thru her insurance that comes out to check on her once a month.  She states she will need a rollator, she has no preference of the agency.  [facility] [employer] [facility].  This will be delivered to her room prior to dc.  Her daughter is her support system and she is at the home with patient.  She gets her medications from [facility] on [address]  She states she is ok with TOC filling meds for her as well.  Physical therapy saw patient , but not sure of recs yet, patient states if she needs HHPT she has no preference for an agency. NCM made referral to [facility] with [facility] for [facility], HHPT.    Expected Discharge Plan: Home w Home Health Services Barriers to Discharge: Continued Medical Work up   Patient Goals and CMS Choice Patient states their goals for this hospitalization and ongoing recovery are:: return home CMS Medicare.gov Compare Post Acute Care list provided to:: Patient Choice offered to / list presented to : Patient      Expected Discharge Plan and Services In-house Referral: NA Discharge Planning Services: CM Consult Post Acute Care Choice: Home Health Living arrangements for the past 2 months: Single Family Home                 DME Arranged: Walker rolling with seat DME Agency: [employer] Date DME Agency Contacted: 07/27/22 Time DME Agency Contacted: 1505 Representative spoke with at DME Agency: Vaughan Basta            Prior Living Arrangements/Services Living arrangements for the past 2 months: Single Family Home Lives  with:: Adult Children Patient language and need for interpreter reviewed:: Yes Do you feel safe going back to the place where you live?: Yes      Need for Family Participation in Patient Care: Yes (Comment) Care giver support system in place?: Yes (comment)   Criminal Activity/Legal Involvement Pertinent to Current Situation/Hospitalization: No - Comment as needed  Activities of Daily Living      Permission Sought/Granted                  Emotional Assessment Appearance:: Appears stated age Attitude/Demeanor/Rapport: Engaged Affect (typically observed): Appropriate Orientation: : Oriented to Self, Oriented to Place, Oriented to  Time, Oriented to Situation Alcohol / Substance Use: Not Applicable Psych Involvement: No (comment)  Admission diagnosis:  Acute exacerbation of CHF (congestive heart failure) (HCC) [I50.9] Noncompliance w/medication treatment due to intermit use of medication [Z91.148] Acute on chronic congestive heart failure, unspecified heart failure type (HCC) [I50.9] Patient Active Problem List   Diagnosis Date Noted   Acute exacerbation of CHF (congestive heart failure) (HCC) 07/26/2022   History of COVID-19 11/19/2019   CKD (chronic kidney disease) stage 4, GFR 15-29 ml/min (HCC) 09/24/2019   Osteopenia of neck of left femur 12/31/2017   Left bundle branch block    Need for immunization against influenza 11/22/2016   Chronic systolic  heart failure (HCC)    Anemia 08/24/2014   Dyslipidemia 04/29/2014   Preventative health care 04/29/2014   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   Coronary artery disease involving native coronary artery of native heart without angina pectoris    Personal history of colonic adenomas 10/07/2007   Type 2 diabetes mellitus with stage 4 chronic kidney disease, without long-term current use of insulin (HCC) 12/05/2005   Hyperlipidemia 12/05/2005   Essential hypertension 12/05/2005   PCP:  Chauncey Mann, DO Pharmacy:   [facility]  PHARMACY [id number] - [place], [place] - [address] [place] [address] Phone: [phone] Fax: [phone]  [facility] [address] [place] [address] Phone: [phone] Fax: [phone]     Social Determinants of Health (SDOH) Social History: SDOH Screenings   Food Insecurity: No Food Insecurity (07/27/2022)  Housing: Low Risk  (07/27/2022)  Transportation Needs: No Transportation Needs (07/27/2022)  Utilities: Not At Risk (01/10/2022)  Alcohol Screen: Low Risk  (07/27/2022)  Depression (PHQ2-9): Low Risk  (01/10/2022)  Financial Resource Strain: Low Risk  (07/27/2022)  Physical Activity: Inactive (01/10/2022)  Social Connections: Socially Isolated (01/10/2022)  Stress: No Stress Concern Present (01/10/2022)  Tobacco Use: Low Risk  (07/27/2022)   SDOH Interventions: Food Insecurity Interventions: Intervention Not Indicated Housing Interventions: Intervention Not Indicated Transportation Interventions: Intervention Not Indicated Alcohol Usage Interventions: Intervention Not Indicated (Score <7) Financial Strain Interventions: Intervention Not Indicated   Readmission Risk Interventions     No data to display

## 2022-07-27 NOTE — Evaluation (Signed)
Occupational Therapy Evaluation Patient Details Name: [name] MRN: [id number] DOB: 19-Sep-1950 Today's Date: 07/27/2022   History of Present Illness Pt is 72 year old presented to [facility] on  07/26/22 for SOB. Pt with acute exacerbation of CHF.  PMH - CHF, DM, CAD, CKD, MI, CABG   Clinical Impression   Pt presents with decline in function and safety with ADLs and ADL mobility with impaired strength, balance and endurance. PTA pt lived with her daughter and was Ind with ADLs/selfcare, IADLs, no AD required for mobility. Pt currently requires min guard A for LB ADLs, grooming/hygiene standing and Sup/min guard A with transfers. Pt would benefit from acute OT services to address impairments to maximize level of function and safety     Recommendations for follow up therapy are one component of a multi-disciplinary discharge planning process, led by the attending physician.  Recommendations may be updated based on patient status, additional functional criteria and insurance authorization.   Assistance Recommended at Discharge PRN  Patient can return home with the following A little help with bathing/dressing/bathroom;A little help with walking and/or transfers;Assistance with cooking/housework;Help with stairs or ramp for entrance    Functional Status Assessment  Patient has had a recent decline in their functional status and demonstrates the ability to make significant improvements in function in a reasonable and predictable amount of time.  Equipment Recommendations  Other (comment) (rollater, reacher, long handle bath sponge)    Recommendations for Other Services       Precautions / Restrictions Precautions Precautions: Fall;Other (comment) Precaution Comments: decreased activity tolerance Restrictions Weight Bearing Restrictions: No      Mobility Bed Mobility Overal bed mobility: Modified Independent                  Transfers Overall transfer level: Needs  assistance Equipment used: None Transfers: Sit to/from Stand Sit to Stand: Supervision                  Balance                                           ADL either performed or assessed with clinical judgement   ADL Overall ADL's : Needs assistance/impaired Eating/Feeding: Independent   Grooming: Wash/dry hands;Wash/dry face;Min guard;Standing   Upper Body Bathing: Modified independent   Lower Body Bathing: Min guard   Upper Body Dressing : Modified independent   Lower Body Dressing: Min guard   Toilet Transfer: Supervision/safety   Toileting- Clothing Manipulation and Hygiene: Supervision/safety   Tub/ Engineer, structural: Min guard   Functional mobility during ADLs: Min guard;Supervision/safety General ADL Comments: min guard A for safety     Vision Ability to See in Adequate Light: 0 Adequate Patient Visual Report: No change from baseline       Perception     Praxis      Pertinent Vitals/Pain Pain Assessment Pain Assessment: No/denies pain Faces Pain Scale: No hurt Pain Intervention(s): Monitored during session, Repositioned     Hand Dominance Right   Extremity/Trunk Assessment Upper Extremity Assessment Upper Extremity Assessment: Generalized weakness   Lower Extremity Assessment Lower Extremity Assessment: Defer to PT evaluation   Cervical / Trunk Assessment Cervical / Trunk Assessment: Normal   Communication Communication Communication: No difficulties   Cognition Arousal/Alertness: Awake/alert Behavior During Therapy: WFL for tasks assessed/performed Overall Cognitive Status: Within Functional Limits for tasks assessed  General Comments  SpO2 97-100% on RA. Dyspnea 2-3/4    Exercises     Shoulder Instructions      Home Living Family/patient expects to be discharged to:: Private residence Living Arrangements: Children;Other relatives Available Help at  Discharge: Family;Available PRN/intermittently Type of Home: Apartment Home Access: Level entry     Home Layout: Two level;Bed/bath upstairs Alternate Level Stairs-Number of Steps: flight Alternate Level Stairs-Rails: Right Bathroom Shower/Tub: Chief Strategy Officer: Handicapped height     Home Equipment: Tub bench          Prior Functioning/Environment Prior Level of Function : Independent/Modified Independent             Mobility Comments: used no AD ADLs Comments: ind with ADLs, cooking        OT Problem List: Decreased activity tolerance;Decreased knowledge of use of DME or AE;Decreased strength      OT Treatment/Interventions: Self-care/ADL training;DME and/or AE instruction;Therapeutic activities;Therapeutic exercise;Energy conservation;Patient/family education    OT Goals(Current goals can be found in the care plan section) Acute Rehab OT Goals Patient Stated Goal: go home OT Goal Formulation: With patient/family Time For Goal Achievement: 08/10/22 Potential to Achieve Goals: Good ADL Goals Pt Will Perform Grooming: with supervision;with set-up;standing;with caregiver independent in assisting Pt Will Perform Lower Body Bathing: with supervision;with caregiver independent in assisting;sit to/from stand Pt Will Perform Lower Body Dressing: with supervision;sit to/from stand;with caregiver independent in assisting Pt Will Transfer to Toilet: with modified independence;ambulating Pt Will Perform Tub/Shower Transfer: with supervision;ambulating;tub bench;with caregiver independent in assisting  OT Frequency: Min 2X/week    Co-evaluation              AM-PAC OT "6 Clicks" Daily Activity     Outcome Measure Help from another person eating meals?: None Help from another person taking care of personal grooming?: A Little Help from another person toileting, which includes using toliet, bedpan, or urinal?: A Little Help from another person bathing  (including washing, rinsing, drying)?: A Little Help from another person to put on and taking off regular upper body clothing?: None Help from another person to put on and taking off regular lower body clothing?: A Little 6 Click Score: 20   End of Session Equipment Utilized During Treatment: Gait belt  Activity Tolerance: Patient tolerated treatment well Patient left: in bed;with call bell/phone within reach;with family/visitor present  OT Visit Diagnosis: Muscle weakness (generalized) (M62.81);Other abnormalities of gait and mobility (R26.89)                Time: 1610-9604 OT Time Calculation (min): 19 min Charges:  OT General Charges $OT Visit: 1 Visit OT Evaluation $OT Eval Low Complexity: 1 Low    Galen Manila 07/27/2022, 3:29 PM

## 2022-07-27 NOTE — Progress Notes (Signed)
Heart Failure Nurse Navigator Progress Note  PCP: Chauncey Mann, DO PCP-Cardiologist: [place] Admission Diagnosis: Acute on chronic congestive heart failure Admitted from: Home  Presentation:   [name] presented with shortness of breath for several days, and heart palpitations. Reports to not taking her medications for several days, swelling of her legs and unable to lay flat in the bed. BNP 2,937, Creat 2.13. BP 155/93, BMI 32.01. IV lasix given.   Patient and daughter were educated on the sign and symptoms of heart failure, daily weights, when to call her doctor or go to the ED. Diet/ fluid restrictions, reported she is non compliant of diet, especially with salty foods ( hotdogs) and drinks a lot of fluids,. Education on taking all medications as prescribed and the why behind it, reported by patient she hasn't been taking her medications when asked why, " reported, I don't  why, I'm just not. " , Education on attending all medical appointments, both patient and daughter verbalized their understanding of education. a HF TOC appointment was scheduled for 08/13/2022 @ 3 pm.   ECHO/ LVEF: <20%  Clinical Course:  Past Medical History:  Diagnosis Date   Acute exacerbation of CHF (congestive heart failure) (HCC) 09/12/2017   Acute gallstone pancreatitis    Acute gallstone pancreatitis    Acute on chronic systolic heart failure (HCC)    Bilateral pleural effusion 02/22/2014   Cardiomyopathy, ischemic, EF 25-30% 03/23/2014   03/01/2014- Date of procedure- CABG- X5- Severe 3-vessel coronary artery disease with severe LV dysfunction.  Ejection fraction 25-30%.  SURGICAL PROCEDURE:  Coronary artery bypass grafting x 5. SURGEON:  Sheliah Plane, MD.      Cardiomyopathy, ischemic, EF 25-30% 03/23/2014   03/01/2014- Date of procedure- CABG- X5- Severe 3-vessel coronary artery disease with severe LV dysfunction.  Ejection fraction 25-30%.  SURGICAL PROCEDURE:  Coronary artery bypass grafting x 5.  SURGEON:  Sheliah Plane, MD.      Chronic systolic heart failure [facility])    Coronary artery disease involving native coronary artery of native heart without angina pectoris    Diabetes mellitus 1998   Dx in 1998. Microalbuminuria, never on insulin.   Diabetes mellitus type 2, controlled, without complications (HCC) 12/05/2005   Last A1C 6.7 (03/21/17)    On Diet Control Only Last Foot Exam: 03/21/17    Last eye exam: to be scheduled early 2019    Dyspnea    Essential hypertension 12/05/2005   Lisinopril 40mg  Daily, Coreg 25mg  BID.    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   Viral URI with cough 03/21/2017     Social History   Socioeconomic History   Marital status: Widowed    Spouse name: Not on file   Number of children: Not on file   Years of education: Not on file   Highest education level: Not on file  Occupational History   Occupation: Armed forces training and education officer  Tobacco Use   Smoking status: Never   Smokeless tobacco: Never  Vaping Use   Vaping Use: Never used  Substance and Sexual Activity   Alcohol use: No    Alcohol/week: 0.0 standard drinks of alcohol  Drug use: No   Sexual activity: Not on file  Other Topics Concern   Not on file  Social History Narrative   Married x 42 yrs.  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   Never smoked. Doesn't drink or use drugs.   Does Patient Exercise:  yes - sometimes      To get diabetes supplies for Antonietta Barcelona RD, CDE, April 20,2011   Social Determinants of Health   Financial Resource Strain: Not on file  Food Insecurity: No Food Insecurity (01/10/2022)   Hunger Vital Sign     Worried About Running Out of Food in the Last Year: Never true    Ran Out of Food in the Last Year: Never true  Transportation Needs: No Transportation Needs (01/10/2022)   PRAPARE - Administrator, Civil Service (Medical): No    Lack of Transportation (Non-Medical): No  Physical Activity: Inactive (01/10/2022)   Exercise Vital Sign    Days of Exercise per Week: 0 days    Minutes of Exercise per Session: 0 min  Stress: No Stress Concern Present (01/10/2022)   [employer] of Occupational Health - Occupational Stress Questionnaire    Feeling of Stress : Only a little  Social Connections: Socially Isolated (01/10/2022)   Social Connection and Isolation Panel [NHANES]    Frequency of Communication with Friends and Family: More than three times a week    Frequency of Social Gatherings with Friends and Family: More than three times a week    Attends Religious Services: Never    Database administrator or Organizations: No    Attends Banker Meetings: Never    Marital Status: Widowed   Education Assessment and Provision:  Detailed education and instructions provided on heart failure disease management including the following:  Signs and symptoms of Heart Failure When to call the physician Importance of daily weights Low sodium diet Fluid restriction Medication management Anticipated future follow-up appointments  Patient education given on each of the above topics.  Patient acknowledges understanding via teach back method and acceptance of all instructions.  Education Materials:  "Living Better With Heart Failure" Booklet, HF zone tool, & Daily Weight Tracker Tool.  Patient has scale at home: Yes Patient has pill box at home: NA    High Risk Criteria for Readmission and/or Poor Patient Outcomes: Heart failure hospital admissions (last 6 months): 1  No Show rate: 7 % Difficult social situation: No,  lives with daughter and Lucila Maine.  Demonstrates  medication adherence: No Primary Language: English Literacy level: Reading, writing, and comprehension  Barriers of Care:   Medication and Diet Compliance Daily weights   Considerations/Referrals:   Referral made to Heart Failure Pharmacist Stewardship: yes Referral made to Heart Failure CSW/NCM TOC: No Referral made to Heart & Vascular TOC clinic: Yes, 08/13/2022 @ 3 pm.   Items for Follow-up on DC/TOC: Medication/ Diet compliance ( doesn't take her medications, when asked "she stated , I don't know, I just don't" Daily weights Continued HF education  Rhae Hammock, BSN, RN Heart Failure Print production planner Chat Only

## 2022-07-28 LAB — GLUCOSE, CAPILLARY
Glucose-Capillary: 113 mg/dL — ABNORMAL HIGH (ref 70–99)
Glucose-Capillary: 254 mg/dL — ABNORMAL HIGH (ref 70–99)
Glucose-Capillary: 106 mg/dL — ABNORMAL HIGH (ref 70–99)
Glucose-Capillary: 298 mg/dL — ABNORMAL HIGH (ref 70–99)

## 2022-07-28 LAB — COMPREHENSIVE METABOLIC PANEL
ALT: 60 U/L — ABNORMAL HIGH (ref 0–44)
AST: 35 U/L (ref 15–41)
Albumin: 2.9 g/dL — ABNORMAL LOW (ref 3.5–5.0)
Alkaline Phosphatase: 100 U/L (ref 38–126)
Anion gap: 9 (ref 5–15)
BUN: 39 mg/dL — ABNORMAL HIGH (ref 8–23)
CO2: 22 mmol/L (ref 22–32)
Calcium: 8.7 mg/dL — ABNORMAL LOW (ref 8.9–10.3)
Chloride: 105 mmol/L (ref 98–111)
Creatinine, Ser: 2.17 mg/dL — ABNORMAL HIGH (ref 0.44–1.00)
GFR, Estimated: 24 mL/min — ABNORMAL LOW (ref >60)
Glucose, Bld: 174 mg/dL — ABNORMAL HIGH (ref 70–99)
Potassium: 3.3 mmol/L — ABNORMAL LOW (ref 3.5–5.1)
Sodium: 136 mmol/L (ref 135–145)
Total Bilirubin: 0.8 mg/dL (ref 0.3–1.2)
Total Protein: 6 g/dL — ABNORMAL LOW (ref 6.5–8.1)

## 2022-07-28 LAB — MAGNESIUM: Magnesium: 1.9 mg/dL (ref 1.7–2.4)

## 2022-07-28 LAB — HEMOGLOBIN A1C
Hgb A1c MFr Bld: 7.9 % — ABNORMAL HIGH (ref 4.8–5.6)
Mean Plasma Glucose: 180 mg/dL

## 2022-07-28 MED ORDER — POTASSIUM CHLORIDE CRYS ER 20 MEQ PO TBCR: 40.0000 meq | EXTENDED_RELEASE_TABLET | Freq: Two times a day (BID) | ORAL | Status: DC

## 2022-07-28 MED ORDER — POTASSIUM CHLORIDE CRYS ER 20 MEQ PO TBCR
40.0000 meq | EXTENDED_RELEASE_TABLET | Freq: Two times a day (BID) | ORAL | Status: AC
  Administered 2022-07-28 (×2): 40 meq via ORAL
  Filled 2022-07-28 (×2): qty 2

## 2022-07-28 NOTE — Progress Notes (Signed)
HD#2 SUBJECTIVE:  Patient Summary: [name] is a 72 y.o. with a pertinent PMH of HFrEF, CKD4, DM2, prior MI who presented to the ED on 07/26/2022 for SOB and lower extremity edema, and admitted for acute heart failure exacerbation.   Overnight Events: Pt states she slept well with good appetite, breathing better, voiding multiple times, and has been ambulating to bedside commode to void. Has noted some improvement of leg swelling. Family at bedside.   OBJECTIVE:  Vital Signs: Vitals:   07/27/22 2333 07/28/22 0431 07/28/22 0434 07/28/22 0858  BP: 122/74 121/80  132/78  Pulse: 92 86  93  Resp: 18 17  (!) 22  Temp: 98 F (36.7 C) (!) 97.5 F (36.4 C)  97.6 F (36.4 C)  TempSrc: Oral Oral  Oral  SpO2: 98% 99%  98%  Weight:   78.2 kg   Height:       Supplemental O2: Room Air SpO2: 98 %  Filed Weights   07/27/22 0500 07/27/22 0610 07/28/22 0434  Weight: 80.4 kg 79.3 kg 78.2 kg     Intake/Output Summary (Last 24 hours) at 07/28/2022 1007 Last data filed at 07/28/2022 1610 Gross per 24 hour  Intake 497 ml  Output 1600 ml  Net -1103 ml   Net IO Since Admission: -3,163 mL [07/28/22 1007]  Physical Exam: Physical Exam Constitutional:      General: She is not in acute distress.    Appearance: She is well-developed.  HENT:     Head: Normocephalic and atraumatic.  Neck:     Vascular: JVD present.  Cardiovascular:     Rate and Rhythm: Normal rate and regular rhythm.     Heart sounds: Murmur heard.     Comments: Systolic murmur present  Pulmonary:     Effort: Pulmonary effort is normal. No accessory muscle usage or respiratory distress.     Comments: Crackles present bilaterally  Musculoskeletal:     Cervical back: Neck supple.     Comments: 2+ edema present in bilateral lower extremities  Skin:    General: Skin is warm and dry.  Neurological:     General: No focal deficit present.     Mental Status: She is alert.  Psychiatric:        Mood and Affect:  Mood normal.        Behavior: Behavior normal.     Patient Lines/Drains/Airways Status     Active Line/Drains/Airways     Name Placement date Placement time Site Days   Peripheral IV 07/26/22 20 G Left Antecubital 07/26/22  1506  Antecubital  2   Incision - 4 Ports Abdomen 1: Umbilicus 2: Mid;Upper 3: Right;Medial 4: Right;Lateral 08/06/16  1345  -- 2182            Pertinent Labs:    Latest Ref Rng & Units 07/27/2022   12:39 AM 07/26/2022    1:02 PM 05/22/2022    7:10 AM  CBC  WBC 4.0 - 10.5 K/uL 5.8  5.5    Hemoglobin 12.0 - 15.0 g/dL 96.0  45.4  09.8   Hematocrit 36.0 - 46.0 % 33.0  34.1  32.0   Platelets 150 - 400 K/uL 189  200         Latest Ref Rng & Units 07/28/2022   12:39 AM 07/27/2022   12:39 AM 07/26/2022    1:02 PM  CMP  Glucose 70 - 99 mg/dL 119  147  829   BUN 8 - 23  mg/dL 39  40  39   Creatinine 0.44 - 1.00 mg/dL 1.61  0.96  0.45   Sodium 135 - 145 mmol/L 136  134  136   Potassium 3.5 - 5.1 mmol/L 3.3  4.2  4.7   Chloride 98 - 111 mmol/L 105  104  101   CO2 22 - 32 mmol/L 22  21  19    Calcium 8.9 - 10.3 mg/dL 8.7  9.1  9.4   Total Protein 6.5 - 8.1 g/dL 6.0  6.3    Total Bilirubin 0.3 - 1.2 mg/dL 0.8  0.9    Alkaline Phos 38 - 126 U/L 100  102    AST 15 - 41 U/L 35  54    ALT 0 - 44 U/L 60  70      Recent Labs    07/27/22 1657 07/27/22 2033 07/28/22 0548  GLUCAP 142* 152* 113*     Pertinent Imaging: No results found.  ASSESSMENT/PLAN:  Assessment: Principal Problem:   Acute exacerbation of CHF (congestive heart failure) (HCC) Active Problems:   Type 2 diabetes mellitus with stage 4 chronic kidney disease, without long-term current use of insulin (HCC)   Hyperlipidemia   Essential hypertension   Noncompliance w/medication treatment due to intermit use of medication   [name] is a 72 y.o. with pertinent PMH of HFrEF, CKD4, DM2, prior MI who presented to the ED on 07/26/2022 for SOB and lower extremity and admitted for acute HF  exacerbation. On hospital day 2  Plan: #Acute exacerbation of CHF #Ischemic cardiomyopathy #History of CAD Acute HF exacerbation likely due to patient stopping her medications for the past month. Patient reports that her SOB and LE edema have improved since yesterday. She is breathing comfortably, denies chest pain. She continues to diurese and had output of 1600 mLs since yesterday. Continue Lasix. Clinically heart failure exacerbation is improving, but creatinine remains elevated, will add GDMT as renal function improves.  -Continue Lasix 40 mg IV TID -Strict I's and O's -Daily weights -Hold GDMTs, consider restarting tomorrow -Continue ASA 81 mg daily and atorvastatin 80 mg daily   #Stage 4 CKD  Her creatinine increased from 2.13 to 2.17. Unclear if lack of renal function improvement is due to active diuresis from Lasix or cardiorenal syndrome. She is still producing urine well, will continue with current Lasix dose at this time. Continue to trend CMP, will adjust Lasix if renal function worsens. -trend CMP -avoid nephrotoxic medications    #Type II Diabetes Mellitus  Well controlled today with BG 113. A1c at 7.9, improved from 11.1 six months ago. Continue sliding scale insulin aspart and monitor glucose. -Follow up A1c -Sliding scale insulin -Monitor glucose   #Hypertension  Currently well controlled. Will continue to monitor BP as we diurese patient. Still holding GDMT at this time. -Consider restarting Coreg 25 mg bid tomorrow -Consider restarting Entresto 24-26 mg bid tomorrow   #Hyperlipidemia  -Continue atorvastatin 80 mg daily.   #Elevated Liver Enzymes This is likely due to a congestive hepatopathy. AST and ALT improved from yesterday. Continue to monitor CMP as patient is diuresed. I expect liver enzymes to normalize. -Monitor CMP for liver enzymes -Consider RUQ ultrasound if liver enzymes still elevated after diuresis.  Best Practice: Diet: Heart IVF: Fluids:  none VTE: heparin injection 5,000 Units Start: 07/26/22 1615 Code: DNR Therapy Recs: Rollator Family Contact: daughter, at bedside. DISPO: Anticipated discharge in 3 days to Home pending  completion of diuresis .  Signature:  Gae Gallop, Medical Student   Please contact the on call pager after 5 pm and on weekends at [phone].\

## 2022-07-28 NOTE — Progress Notes (Signed)
Mobility Specialist Progress Note:   07/28/22 0940  Mobility  Activity Ambulated with assistance in hallway  Level of Assistance Contact guard assist, steadying assist  Assistive Device Four wheel walker  Distance Ambulated (ft) 250 ft  Activity Response Tolerated well  Mobility Referral Yes  $Mobility charge 1 Mobility  Mobility Specialist Start Time (ACUTE ONLY) 0940  Mobility Specialist Stop Time (ACUTE ONLY) 0949  Mobility Specialist Time Calculation (min) (ACUTE ONLY) 9 min   Pt eager for mobility session. Required only minG assist for ambulation with no c/o throughout. Pt back in bed with all needs met.   Addison Lank Mobility Specialist Please contact via SecureChat or  Rehab office at [phone]

## 2022-07-28 NOTE — Progress Notes (Signed)
Pt Brother [name] was able to come up to the pt room with confidential status. Security was called and made aware that only daughter and grandsons are allowed to come to pt room. Denies wanting to move to another room.

## 2022-07-28 NOTE — Plan of Care (Signed)
  Problem: Education: Goal: Ability to describe self-care measures that may prevent or decrease complications (Diabetes Survival Skills Education) will improve Outcome: Progressing   Problem: Coping: Goal: Ability to adjust to condition or change in health will improve Outcome: Progressing   Problem: Fluid Volume: Goal: Ability to maintain a balanced intake and output will improve Outcome: Progressing   

## 2022-07-29 LAB — COMPREHENSIVE METABOLIC PANEL
ALT: 50 U/L — ABNORMAL HIGH (ref 0–44)
AST: 26 U/L (ref 15–41)
Albumin: 2.9 g/dL — ABNORMAL LOW (ref 3.5–5.0)
Alkaline Phosphatase: 106 U/L (ref 38–126)
Anion gap: 7 (ref 5–15)
BUN: 37 mg/dL — ABNORMAL HIGH (ref 8–23)
CO2: 23 mmol/L (ref 22–32)
Calcium: 8.9 mg/dL (ref 8.9–10.3)
Chloride: 107 mmol/L (ref 98–111)
Creatinine, Ser: 2.18 mg/dL — ABNORMAL HIGH (ref 0.44–1.00)
GFR, Estimated: 23 mL/min — ABNORMAL LOW (ref >60)
Glucose, Bld: 126 mg/dL — ABNORMAL HIGH (ref 70–99)
Potassium: 4.4 mmol/L (ref 3.5–5.1)
Sodium: 137 mmol/L (ref 135–145)
Total Bilirubin: 0.6 mg/dL (ref 0.3–1.2)
Total Protein: 6.2 g/dL — ABNORMAL LOW (ref 6.5–8.1)

## 2022-07-29 LAB — GLUCOSE, CAPILLARY
Glucose-Capillary: 92 mg/dL (ref 70–99)
Glucose-Capillary: 135 mg/dL — ABNORMAL HIGH (ref 70–99)
Glucose-Capillary: 166 mg/dL — ABNORMAL HIGH (ref 70–99)
Glucose-Capillary: 150 mg/dL — ABNORMAL HIGH (ref 70–99)

## 2022-07-29 MED ORDER — SENNOSIDES-DOCUSATE SODIUM 8.6-50 MG PO TABS
1.0000 | ORAL_TABLET | Freq: Every day | ORAL | Status: DC
  Administered 2022-07-29 – 2022-07-30 (×2): 1 via ORAL
  Filled 2022-07-29 (×2): qty 1

## 2022-07-29 NOTE — Progress Notes (Signed)
Mobility Specialist Progress Note:   07/29/22 0900  Mobility  Activity Ambulated with assistance in hallway  Level of Assistance Contact guard assist, steadying assist  Assistive Device Four wheel walker  Distance Ambulated (ft) 250 ft  Activity Response Tolerated well  Mobility Referral Yes  $Mobility charge 1 Mobility  Mobility Specialist Start Time (ACUTE ONLY) 0900  Mobility Specialist Stop Time (ACUTE ONLY) 0910  Mobility Specialist Time Calculation (min) (ACUTE ONLY) 10 min   Pt eager for mobility session. Required only minG assist throughout session. No LOB noted. Pt required x1 quick standing rest break. Pt back in bed with all needs met.   Addison Lank Mobility Specialist Please contact via SecureChat or  Rehab office at [phone]

## 2022-07-29 NOTE — Progress Notes (Addendum)
Subjective:  [name] is a 72 yo female with PMH of HFrEF, CKD4, DM2, prior MI who presented to the ED on 07/26/2022 for SOB and lower extremity edema likely due to HF exacerbation after not taking her medication for the past month.   She reports that she had some difficulty sleeping because of having to use the bathroom and being constipated, for which she reports the nurse is getting her Miralax. Her shortness of breath and lower extremity edema are improving daily. She ate breakfast this morning. She is still peeing frequently. Again inquired about reasons for medication non adherence, for which she again responded that she "just didn't take them". Informed her of plans for care and she was agreeable with no further questions.  Objective:  Vital signs in last 24 hours: Vitals:   07/29/22 0400 07/29/22 0810 07/29/22 1035 07/29/22 1130  BP: 126/80 125/71  125/79  Pulse: 91 92  89  Resp: 15 17 17 18   Temp: 97.8 F (36.6 C) 97.9 F (36.6 C)  97.8 F (36.6 C)  TempSrc: Oral Oral  Oral  SpO2: 98% 97%  100%  Weight:      Height:       Weight change: -1.2 kg since admission  Intake/Output Summary (Last 24 hours) at 07/29/2022 1209 Last data filed at 07/29/2022 1610 Gross per 24 hour  Intake 840 ml  Output 801 ml  Net 39 ml   Physical Exam Gen: alert, well appearing, in no acute distress HEENT: normocephalic, atraumatic CV: RRR, systolic murmur appreciated Pulm: normal WOB, crackles present in bilateral lung bases Ext: 2+ bilateral lower extremity edema Psych: normal mood and affect   Assessment/Plan:  Principal Problem:   Acute exacerbation of CHF (congestive heart failure) (HCC) Active Problems:   Type 2 diabetes mellitus with stage 4 chronic kidney disease, without long-term current use of insulin (HCC)   Hyperlipidemia   Essential hypertension   Noncompliance w/medication treatment due to intermit use of medication   #Acute exacerbation of CHF #Ischemic  cardiomyopathy #History of CAD Patient reports that her SOB and lower extremity edema are improving each day. She is breathing comfortably on RA. Crackles are appreciable on pulmonary exam. Noticeable improvement in edema. She reports that she is still peeing a lot--unsure if I/Os charted are accurate. Patient is still responding to lasix so will continue to give 40 mg IV lasix TID. -Continue Lasix 40 mg IV three times per day -Strict I's and O's -Daily weights -Hold GDMTs, consider restarting at a later time -Continue ASA 81 mg daily and atorvastatin 80 mg daily  #Stage 4 CKD  Kidney function has not improved but remained stable. BUN went from 39 to 37, Creatinine from 2.17 to 2.18, and GFR from 24 to 23. This could be a new baseline for her. Continue to monitor CMP as diuresed. -monitor CMP in mornings  #Type II Diabetes Mellitus  A1c was 7.9, glucose was 92 at 0600 and 135 at 1128 today. Sugars well controlled with sliding scale insulin. Holding off on restarting jardiance or semaglutide for now. May need to evaluate how long she has been on jardiance and decide if she should continue at a later time since her eGFR is 23. -continue sliding scale insulin -monitor CBG  #Hypertension  BP is well controlled at 125/71. No need to restart Coreg at this time. -monitor BP -Consider restarting Coreg 25 bid as BP allows  #Hyperlipidemia  -Continue atorvastatin 80 mg daily.  #Elevated Liver Enzymes  Liver  enzymes have improved from AST 35 to 26 and ALT 60 to 50. This was likely a congestive hepatopathy and has improved after diuresis. Continue to monitor for normalizing values. -monitor CMP  #Constipation Patient reports small, hard stools. Start senna-docusate tablets. -Senna-docusate tablet -Monitor symptoms   LOS: 3 days   Barrett Shell, Medical Student 07/29/2022, 12:09 PM      I have seen and examined the patient, and reviewed the daily progress note by Randell Patient, MS 3  and discussed the care of the patient with them.   For her HFrEF exacerbation, she reports continued and increased urination despite I's/O's with 800 cc documented overnight. I suspect that the several episodes of unmeasured UOP would align with patient's reports. Will continue with IV lasix 40 mg TID given ongoing good response. In regards to GDMT, she has been off of these medications for at least one month; would hesitate to restart jardiance given low GFR and non-adherence for some time. Would be more open to restarting if she had been adherent to regimen with this GFR, rather than essentially restarting the medication at such low renal function. Her BP is normotensive and she still has evidence of possible AKI on CKD 4-5 so will hold off on restarting entresto. Will hold off during acute exacerbation on restarting B-blocker as well.  Subjectively she is improved in regards to her swelling and breathing. Unclear if she is at a new baseline CKD and want to trend renal function with further diuresis. Plan to decrease lasix dose/transition to PO as UOP decreases and clinical euvolemia approaches, or if renal function begins to downtrend.  Signed:  Champ Mungo, DO 07/29/2022, 12:14 PM

## 2022-07-30 ENCOUNTER — Other Ambulatory Visit (HOSPITAL_COMMUNITY): Payer: Self-pay

## 2022-07-30 DIAGNOSIS — I110 Hypertensive heart disease with heart failure: Secondary | ICD-10-CM

## 2022-07-30 LAB — COMPREHENSIVE METABOLIC PANEL
ALT: 41 U/L (ref 0–44)
AST: 28 U/L (ref 15–41)
Albumin: 3 g/dL — ABNORMAL LOW (ref 3.5–5.0)
Alkaline Phosphatase: 92 U/L (ref 38–126)
Anion gap: 9 (ref 5–15)
BUN: 38 mg/dL — ABNORMAL HIGH (ref 8–23)
CO2: 24 mmol/L (ref 22–32)
Calcium: 9 mg/dL (ref 8.9–10.3)
Chloride: 104 mmol/L (ref 98–111)
Creatinine, Ser: 2.33 mg/dL — ABNORMAL HIGH (ref 0.44–1.00)
GFR, Estimated: 22 mL/min — ABNORMAL LOW (ref >60)
Glucose, Bld: 162 mg/dL — ABNORMAL HIGH (ref 70–99)
Potassium: 4.9 mmol/L (ref 3.5–5.1)
Sodium: 137 mmol/L (ref 135–145)
Total Bilirubin: 0.9 mg/dL (ref 0.3–1.2)
Total Protein: 6.2 g/dL — ABNORMAL LOW (ref 6.5–8.1)

## 2022-07-30 LAB — GLUCOSE, CAPILLARY: Glucose-Capillary: 108 mg/dL — ABNORMAL HIGH (ref 70–99)

## 2022-07-30 MED ORDER — CARVEDILOL 25 MG PO TABS
25.0000 mg | ORAL_TABLET | Freq: Two times a day (BID) | ORAL | Status: DC
  Administered 2022-07-30: 25 mg via ORAL
  Filled 2022-07-30: qty 1

## 2022-07-30 MED ORDER — CARVEDILOL 25 MG PO TABS
25.0000 mg | ORAL_TABLET | Freq: Two times a day (BID) | ORAL | 0 refills | Status: DC
  Filled 2022-07-30: qty 60, 30d supply, fill #0

## 2022-07-30 MED ORDER — FUROSEMIDE 40 MG PO TABS
40.0000 mg | ORAL_TABLET | Freq: Two times a day (BID) | ORAL | 0 refills | Status: DC
Start: 2022-07-30 — End: 2022-11-12
  Filled 2022-07-30: qty 60, 30d supply, fill #0

## 2022-07-30 MED ORDER — FUROSEMIDE 40 MG PO TABS: 40.0000 mg | ORAL_TABLET | Freq: Two times a day (BID) | ORAL | Status: DC

## 2022-07-30 NOTE — TOC Transition Note (Signed)
Transition of Care [facility]) - CM/SW Discharge Note   Patient Details  Name: [name] MRN: [id number] Date of Birth: 1950-02-15  [facility] CM/SW Contact:  Leone Haven, RN Phone Number: 07/30/2022, 11:23 AM   Clinical Narrative:    Patient is for dc today, daughter is at the bedside. Daughter states her son (daughter son)  will be coming to transport patient home. Today.  [employer] to deliver the rollator to room. Cyndi with [facility] notified.       Barriers to Discharge: Continued Medical Work up   Patient Goals and CMS Choice CMS Medicare.gov Compare Post Acute Care list provided to:: Patient Choice offered to / list presented to : Patient  Discharge Placement                         Discharge Plan and Services Additional resources added to the After Visit Summary for   In-house Referral: NA Discharge Planning Services: CM Consult Post Acute Care Choice: Home Health          DME Arranged: Walker rolling with seat DME Agency: [employer] Date DME Agency Contacted: 07/27/22 Time DME Agency Contacted: 1505 Representative spoke with at DME Agency: Vaughan Basta HH Arranged: RN, PT HH Agency: [facility] Date [facility] Agency Contacted: 07/27/22 Time HH Agency Contacted: 1519 Representative spoke with at [facility] Agency: Kandee Keen  Social Determinants of Health (SDOH) Interventions SDOH Screenings   Food Insecurity: No Food Insecurity (07/27/2022)  Housing: Low Risk  (07/27/2022)  Transportation Needs: No Transportation Needs (07/27/2022)  Utilities: Not At Risk (01/10/2022)  Alcohol Screen: Low Risk  (07/27/2022)  Depression (PHQ2-9): Low Risk  (01/10/2022)  Financial Resource Strain: Low Risk  (07/27/2022)  Physical Activity: Inactive (01/10/2022)  Social Connections: Socially Isolated (01/10/2022)  Stress: No Stress Concern Present (01/10/2022)  Tobacco Use: Low Risk  (07/27/2022)     Readmission Risk Interventions     No data to  display

## 2022-07-30 NOTE — Progress Notes (Signed)
Occupational Therapy Treatment Patient Details Name: [name] MRN: [id number] DOB: May 28, 1950 Today's Date: 07/30/2022   History of present illness Pt is 72 year old presented [facility] on  07/26/22 for SOB. Pt with acute exacerbation of CHF.  PMH - CHF, DM, CAD, CKD, MI, CABG   OT comments  Focus of session on educating pt and daughter in energy conservation strategies and benefits of rollator during ADLs. Pt mobilized to [facility] and sink with supervision. Encouraged pt to continue to help around the home with IADLs for endurance. VSS on RA.   Recommendations for follow up therapy are one component of a multi-disciplinary discharge planning process, led by the attending physician.  Recommendations may be updated based on patient status, additional functional criteria and insurance authorization.    Assistance Recommended at Discharge PRN  Patient can return home with the following  A little help with bathing/dressing/bathroom;A little help with walking and/or transfers;Assistance with cooking/housework;Help with stairs or ramp for entrance   Equipment Recommendations  Other (comment) (rollator)    Recommendations for Other Services      Precautions / Restrictions Precautions Precautions: Fall Restrictions Weight Bearing Restrictions: No       Mobility Bed Mobility Overal bed mobility: Modified Independent                  Transfers Overall transfer level: Needs assistance Equipment used: None Transfers: Sit to/from Stand Sit to Stand: Supervision                 Balance                                           ADL either performed or assessed with clinical judgement   ADL Overall ADL's : Needs assistance/impaired     Grooming: Supervision/safety;Standing                   Toilet Transfer: Supervision/safety;Stand-pivot;BSC/3in1   Psychiatrist and Hygiene: Supervision/safety       Functional mobility  during ADLs: Supervision/safety General ADL Comments: educated in energy conservation and provided written handout to reinforce    Extremity/Trunk Assessment              Vision       Perception     Praxis      Cognition Arousal/Alertness: Awake/alert Behavior During Therapy: WFL for tasks assessed/performed Overall Cognitive Status: Within Functional Limits for tasks assessed                                          Exercises      Shoulder Instructions       General Comments      Pertinent Vitals/ Pain       Pain Assessment Pain Assessment: No/denies pain  Home Living                                          Prior Functioning/Environment              Frequency  Min 2X/week        Progress Toward Goals  OT Goals(current goals can now be found in the care plan  section)  Progress towards OT goals: Progressing toward goals  Acute Rehab OT Goals OT Goal Formulation: With patient/family Time For Goal Achievement: 08/10/22 Potential to Achieve Goals: Good  Plan Discharge plan remains appropriate    Co-evaluation                 AM-PAC OT "6 Clicks" Daily Activity     Outcome Measure   Help from another person eating meals?: None Help from another person taking care of personal grooming?: A Little Help from another person toileting, which includes using toliet, bedpan, or urinal?: A Little Help from another person bathing (including washing, rinsing, drying)?: A Little Help from another person to put on and taking off regular upper body clothing?: None Help from another person to put on and taking off regular lower body clothing?: A Little 6 Click Score: 20    End of Session Equipment Utilized During Treatment: Gait belt  OT Visit Diagnosis: Muscle weakness (generalized) (M62.81);Other abnormalities of gait and mobility (R26.89)   Activity Tolerance Patient tolerated treatment well   Patient Left  in bed;with call bell/phone within reach;with family/visitor present   Nurse Communication          Time: 1610-9604 OT Time Calculation (min): 13 min  Charges: OT General Charges $OT Visit: 1 Visit OT Treatments $Self Care/Home Management : 8-22 mins  Berna Spare, OTR/L Acute Rehabilitation Services Office: [phone]   Evern Bio 07/30/2022, 12:09 PM

## 2022-07-30 NOTE — Discharge Summary (Cosign Needed Addendum)
Name: [name] MRN: [id number] DOB: 10-May-1950 72 y.o. PCP: Chauncey Mann, DO  Date of Admission: 07/26/2022 12:53 PM Date of Discharge: 07/30/2022 Attending Physician: Dr. Oswaldo Done  Discharge Diagnosis: Principal Problem:   Acute exacerbation of CHF (congestive heart failure) (HCC) Active Problems:   Type 2 diabetes mellitus with stage 4 chronic kidney disease, without long-term current use of insulin (HCC)   Hyperlipidemia   Essential hypertension   Noncompliance w/medication treatment due to intermit use of medication    Discharge Medications: Allergies as of 07/30/2022   No Known Allergies      Medication List     STOP taking these medications    metolazone 5 MG tablet Commonly known as: ZAROXOLYN       TAKE these medications    aspirin EC 81 MG tablet Take 1 tablet (81 mg total) by mouth daily.   atorvastatin 80 MG tablet Commonly known as: LIPITOR TAKE 1 TABLET BY MOUTH DAILY   calcium carbonate 1500 (600 Ca) MG Tabs tablet Commonly known as: OSCAL Take 1,500 mg by mouth daily.   carvedilol 25 MG tablet Commonly known as: COREG Take 1 tablet (25 mg total) by mouth 2 (two) times daily with a meal. What changed: See the new instructions.   Entresto 24-26 MG Generic drug: sacubitril-valsartan Take 1 tablet by mouth 2 (two) times daily.   ezetimibe 10 MG tablet Commonly known as: ZETIA TAKE ONE TABLET BY MOUTH DAILY   furosemide 40 MG tablet Commonly known as: LASIX Take 1 tablet (40 mg total) by mouth 2 (two) times daily. What changed:  medication strength when to take this   Jardiance 25 MG Tabs tablet Generic drug: empagliflozin TAKE 1 TABLET BY MOUTH DAILY   Pen Needles 32G X 4 MM Misc 1 each by Does not apply route as directed.   Semaglutide(0.25 or 0.5MG /DOS) 2 MG/3ML Sopn Inject 0.5 mg into the skin once a week.               Durable Medical Equipment  (From admission, onward)           Start     Ordered    07/27/22 1517  For home use only DME 4 wheeled rolling walker with seat  Once       Question:  Patient needs a walker to treat with the following condition  Answer:  Abnormal gait   07/27/22 1517   07/27/22 1503  For home use only DME 4 wheeled rolling walker with seat  Once       Question:  Patient needs a walker to treat with the following condition  Answer:  Weakness   07/27/22 1502            Disposition and follow-up:   [name] was discharged from [facility] in Good condition.  At the hospital follow up visit please address:  1.  Follow-up:  a. CHF: ensure adherence to coreg, lasix, entresto, jardiance, ASA. Evaluate symptoms and fluid status.    b. DM2: continue semaglutide inj and jardiance. Recheck A1c in 3 months.   c. HLD: ensure compliance to atorvastin and zetia. Check lipid panel as appropriate.   d. HTN: Check BP, ensure compliance with coreg and entresto.    E. Cognition: evaluate cognitive function.  2.  Labs / imaging needed at time of follow-up: None  3.  Pending labs/ test needing follow-up: None  4.  Medication Changes  Started: No new medication  Stopped: Zaroxolyn  Changed: Take Lasix 40 mg po BID  Follow-up Appointments:  Follow-up Information     [facility]. Go in 14 day(s).   Specialty: Cardiology Why: Hospital follow up 08/13/2022 @ 3 pm PLEASE bring a current medication list to appointment FREE valet parking, Entrance C, off [employer] information: [address] [phone] mc [place] [address] [phone]        [facility] Follow up.   Why: rollator Contact information: [phone]        [facility] Follow up.   Specialty: Home Health Services Why: Agency will call you to set up apt times. Contact information: [address] [place] [address] [phone]                  Hospital Course by problem list:  This is a 72 yo female with PMH of HFrEF (<20% on echo 06/05/2022), CKD 4-5, DM2, HTN, HLD who presented to the ED with SOB and lower extremity edema after not taking her medications for the past month admitted for a heart failure exacerbation.  #Acute exacerbation of CHF #Ischemic cardiomyopathy #History of CAD Patient has HFrEF from ischemic cardiomyopathy after MI in 2016. Last echo on 06/05/2022 showed EF <20% with global LV hypokinesis and paradoxical septal motion consistent with a LBBB, as well as moderate mitral regurgitation. She could not give any reason for why she had not been taking her medication and reports that this has happened once before. She denies mood changes, major life changes or stressors, or barriers to accessing or affording medications. ECG in ED showed normal sinus rhythm with widened QRS consistent with LBBB. Held [place], [place], [place], and metolazone. Patient started on 120 mg total IV lasix daily. Patient responded well with increased urine output and fluid status improved daily. Discharged on 07/30/2022. Continue/restart Lasix 40 mg po BID, Coreg 25 mg bid, Entresto 24-26 mg BID, Jardiance 25 mg daily. Follow up with PCP and cardiologist.  #Stage 4 CKD  Patient has history of CKD stage 4 and likely an AKI due to cardiorenal syndrome from HF exacerbation. BMP/CMP monitored for kidney function which was stable with eGFR ranging from 22-24. Tolerated diuresis well. Will restart Entresto and Jardiance. Monitor kidney function outpatient.  #Type II Diabetes Mellitus  Patient prescribed Jardiance 25 mg and semaglutide 0.5 but was not taking for the past month. Glucose was 220 on admission and A1c was 7.9 improved from 11.1 six months ago. She was given sliding scale insulin while in the hospital and CBG was well controlled. Restarting her semaglutide and jardiance at discharge. Follow up with PCP.  #Hypertension  Held Coreg and  Jardiance upon admission but BP remained WNL in 120s/70s. Restarting Coreg 25 mg bid and Jardiance 25 mg pid at discharge for HTN and HFrEF.  #Hyperlipidemia  No lipid panel obtained while in hospital. Taking atorvastatin 80 mg daily and zetia 10 mg daily. Monitor outpatient.   Discharge Subjective: Patient reports that her SOB and lower extremity edema have continued to improve. She has still been frequently urinating. Her constipation has also now resolved. She has been eating and sleeping well. Patient and patient's daughter report that she is back to her baseline status. Discussed discharge for later today and patient is ready for discharge. Explained importance of taking her medications and she reports understanding. Advised to follow up with PCP and cardiologist. Advised to limit salt intake.  Discharge  Exam:   BP 103/67 (BP Location: Right Arm)   Pulse 89   Temp 97.8 F (36.6 C) (Oral)   Resp 17   Ht 5\' 2"  (1.575 m)   Wt 76.8 kg   SpO2 100%   BMI 30.98 kg/m  Constitutional: well-appearing, sitting in bed, in no acute distress HEENT: normocephalic atraumatic Cardiovascular: regular rate and rhythm, no m/r/g Pulmonary/Chest: normal work of breathing on room air, lungs clear to auscultation bilaterally Ext: 1+ pitting edema to mid tibia Psych: normal mood and affect   Pertinent Labs, Studies, and Procedures:     Latest Ref Rng & Units 07/27/2022   12:39 AM 07/26/2022    1:02 PM 05/22/2022    7:10 AM  CBC  WBC 4.0 - 10.5 K/uL 5.8  5.5    Hemoglobin 12.0 - 15.0 g/dL 40.9  81.1  91.4   Hematocrit 36.0 - 46.0 % 33.0  34.1  32.0   Platelets 150 - 400 K/uL 189  200         Latest Ref Rng & Units 07/30/2022   12:39 AM 07/29/2022    1:00 AM 07/28/2022   12:39 AM  CMP  Glucose 70 - 99 mg/dL 782  956  213   BUN 8 - 23 mg/dL 38  37  39   Creatinine 0.44 - 1.00 mg/dL 0.86  5.78  4.69   Sodium 135 - 145 mmol/L 137  137  136   Potassium 3.5 - 5.1 mmol/L 4.9  4.4  3.3   Chloride 98 -  111 mmol/L 104  107  105   CO2 22 - 32 mmol/L 24  23  22    Calcium 8.9 - 10.3 mg/dL 9.0  8.9  8.7   Total Protein 6.5 - 8.1 g/dL 6.2  6.2  6.0   Total Bilirubin 0.3 - 1.2 mg/dL 0.9  0.6  0.8   Alkaline Phos 38 - 126 U/L 92  106  100   AST 15 - 41 U/L 28  26  35   ALT 0 - 44 U/L 41  50  60     DG Chest 2 View  Result Date: 07/26/2022 CLINICAL DATA:  Shortness of breath EXAM: CHEST - 2 VIEW COMPARISON:  11/20/2019 FINDINGS: Heart size is upper limits of normal. Prior sternotomy and CABG. Aortic atherosclerosis. Small bilateral pleural effusions with associated mild bibasilar opacities. Linear opacities in the bilateral perihilar regions, likely atelectasis or scarring. No pneumothorax. IMPRESSION: Small bilateral pleural effusions with associated mild bibasilar opacities, likely atelectasis. Electronically Signed   By: Duanne Guess D.O.   On: 07/26/2022 13:57     Discharge Instructions: Discharge Instructions     (HEART FAILURE PATIENTS) Call MD:  Anytime you have any of the following symptoms: 1) 3 pound weight gain in 24 hours or 5 pounds in 1 week 2) shortness of breath, with or without a dry hacking cough 3) swelling in the hands, feet or stomach 4) if you have to sleep on extra pillows at night in order to breathe.   Complete by: As directed    Call MD for:  persistant dizziness or light-headedness   Complete by: As directed    Diet - low sodium heart healthy   Complete by: As directed    Discharge instructions   Complete by: As directed    Dear [name],  You were hospitalized for a heart failure exacerbation. We treated you with IV diuretics to remove fluid from the body and got  you feeling better. At home, you should take Coreg two times per day and Lasix two times per day. You should also continue to take aspirin, Lipitor, calcium carbonate, Zetia, and Ozempic as you were previously prescribed.    Stop taking Sherryll Burger and Jardiance until you see your primary care doctor.    Your appointment is Tuesday (7/9) 10:45 AM with Dr. Mickie Bail at the internal medicine center.   While in the hospital, you worked with the physical therapists. They recommended that you also receive physical therapy at home, and we have ordered this for you. Also, please make sure to call your cardiologist to make an appointment.   If you have any questions or concerns before your appointment, call [phone] and ask to speak to the on-call resident.   Discharge instructions   Complete by: As directed    UPDATED DISCHARGE INSTRUCTIONS  Dear [name],   You were hospitalized for a heart failure exacerbation. We treated you with IV diuretics to remove fluid from the body and got you feeling better. At home, you should take Coreg two times per day, Lasix two times per day, Entresto two times per day, and Jardiance once per day. You should also continue to take aspirin, Lipitor, calcium carbonate, Zetia, and Ozempic as you were previously prescribed.     Your appointment is Tuesday (7/9) 10:45 AM with Dr. Mickie Bail at the internal medicine center.    While in the hospital, you worked with the physical therapists. They recommended that you also receive physical therapy at home, and we have ordered this for you. Also, please make sure to call your cardiologist to make an appointment.    If you have any questions or concerns before your appointment, call [phone] and ask to speak to the on-call resident.   Silva-to-Silva encounter (required for Medicare/Medicaid patients)   Complete by: As directed    I Rayann N Atway certify that this patient is under my care and that I, or a nurse practitioner or physician's assistant working with me, had a Silva-to-Silva encounter that meets the physician Silva-to-Silva encounter requirements with this patient on 07/30/2022. The encounter with the patient was in whole, or in part for the following medical condition(s) which is the primary reason for home health  care (List medical condition): physical deconditioning and congestive heart failure   The encounter with the patient was in whole, or in part, for the following medical condition, which is the primary reason for home health care: physical deconditioning   I certify that, based on my findings, the following services are medically necessary home health services:  Physical therapy Nursing     Reason for Medically Necessary Home Health Services:  Skilled Nursing- Changes in Medication/Medication Management Therapy- Therapeutic Exercises to Increase Strength and Endurance     My clinical findings support the need for the above services: Unable to leave home safely without assistance and/or assistive device   Further, I certify that my clinical findings support that this patient is homebound due to: Unable to leave home safely without assistance   Home Health   Complete by: As directed    To provide the following care/treatments: RN   Increase activity slowly   Complete by: As directed        Signed: Barrett Shell, Medical Student 07/30/2022, 12:09 PM   Pager: [phone]  Attestation for Student Documentation:  I personally was present and performed or re-performed the history, physical exam and medical decision-making activities of  this service and have verified that the service and findings are accurately documented in the student's note.  Morrie Sheldon, MD 07/30/2022, 1:02 PM

## 2022-07-30 NOTE — Plan of Care (Signed)
  Problem: Education: Goal: Ability to describe self-care measures that may prevent or decrease complications (Diabetes Survival Skills Education) will improve Outcome: Progressing Goal: Individualized Educational Video(s) Outcome: Progressing   Problem: Coping: Goal: Ability to adjust to condition or change in health will improve Outcome: Progressing   Problem: Fluid Volume: Goal: Ability to maintain a balanced intake and output will improve Outcome: Progressing   Problem: Health Behavior/Discharge Planning: Goal: Ability to identify and utilize available resources and services will improve Outcome: Progressing Goal: Ability to manage health-related needs will improve Outcome: Progressing   Problem: Metabolic: Goal: Ability to maintain appropriate glucose levels will improve Outcome: Progressing   Problem: Nutritional: Goal: Maintenance of adequate nutrition will improve Outcome: Progressing Goal: Progress toward achieving an optimal weight will improve Outcome: Progressing   Problem: Skin Integrity: Goal: Risk for impaired skin integrity will decrease Outcome: Progressing   Problem: Tissue Perfusion: Goal: Adequacy of tissue perfusion will improve Outcome: Progressing   Problem: Education: Goal: Knowledge of General Education information will improve Description: Including pain rating scale, medication(s)/side effects and non-pharmacologic comfort measures Outcome: Progressing   Problem: Health Behavior/Discharge Planning: Goal: Ability to manage health-related needs will improve Outcome: Progressing   Problem: Clinical Measurements: Goal: Ability to maintain clinical measurements within normal limits will improve Outcome: Progressing Goal: Will remain free from infection Outcome: Progressing Goal: Diagnostic test results will improve Outcome: Progressing Goal: Respiratory complications will improve Outcome: Progressing Goal: Cardiovascular complication will  be avoided Outcome: Progressing   Problem: Activity: Goal: Risk for activity intolerance will decrease Outcome: Progressing   Problem: Nutrition: Goal: Adequate nutrition will be maintained Outcome: Progressing   Problem: Coping: Goal: Level of anxiety will decrease Outcome: Progressing   Problem: Elimination: Goal: Will not experience complications related to bowel motility Outcome: Progressing Goal: Will not experience complications related to urinary retention Outcome: Progressing   Problem: Pain Managment: Goal: General experience of comfort will improve Outcome: Progressing   Problem: Safety: Goal: Ability to remain free from injury will improve Outcome: Progressing   Problem: Skin Integrity: Goal: Risk for impaired skin integrity will decrease Outcome: Progressing   Problem: Education: Goal: Ability to demonstrate management of disease process will improve Outcome: Progressing Goal: Ability to verbalize understanding of medication therapies will improve Outcome: Progressing Goal: Individualized Educational Video(s) Outcome: Progressing   Problem: Activity: Goal: Capacity to carry out activities will improve Outcome: Progressing   Problem: Cardiac: Goal: Ability to achieve and maintain adequate cardiopulmonary perfusion will improve Outcome: Progressing   

## 2022-07-30 NOTE — Progress Notes (Signed)
Pt transported to [facility] pharmacy man to Main entrance with Daughter and grandson. By Writer  Discharge paperwork given and IV and tele removed by Assigned Nurse Udora,RN.

## 2022-07-30 NOTE — Plan of Care (Signed)
  Problem: Education: Goal: Ability to describe self-care measures that may prevent or decrease complications (Diabetes Survival Skills Education) will improve Outcome: Adequate for Discharge Goal: Individualized Educational Video(s) Outcome: Adequate for Discharge   Problem: Coping: Goal: Ability to adjust to condition or change in health will improve Outcome: Adequate for Discharge   Problem: Fluid Volume: Goal: Ability to maintain a balanced intake and output will improve Outcome: Adequate for Discharge   Problem: Health Behavior/Discharge Planning: Goal: Ability to identify and utilize available resources and services will improve Outcome: Adequate for Discharge Goal: Ability to manage health-related needs will improve Outcome: Adequate for Discharge   Problem: Metabolic: Goal: Ability to maintain appropriate glucose levels will improve Outcome: Adequate for Discharge   Problem: Nutritional: Goal: Maintenance of adequate nutrition will improve Outcome: Adequate for Discharge Goal: Progress toward achieving an optimal weight will improve Outcome: Adequate for Discharge   Problem: Skin Integrity: Goal: Risk for impaired skin integrity will decrease Outcome: Adequate for Discharge   Problem: Tissue Perfusion: Goal: Adequacy of tissue perfusion will improve Outcome: Adequate for Discharge   Problem: Education: Goal: Knowledge of General Education information will improve Description: Including pain rating scale, medication(s)/side effects and non-pharmacologic comfort measures Outcome: Adequate for Discharge   Problem: Health Behavior/Discharge Planning: Goal: Ability to manage health-related needs will improve Outcome: Adequate for Discharge   Problem: Clinical Measurements: Goal: Ability to maintain clinical measurements within normal limits will improve Outcome: Adequate for Discharge Goal: Will remain free from infection Outcome: Adequate for Discharge Goal:  Diagnostic test results will improve Outcome: Adequate for Discharge Goal: Respiratory complications will improve Outcome: Adequate for Discharge Goal: Cardiovascular complication will be avoided Outcome: Adequate for Discharge   Problem: Activity: Goal: Risk for activity intolerance will decrease Outcome: Adequate for Discharge   Problem: Nutrition: Goal: Adequate nutrition will be maintained Outcome: Adequate for Discharge   Problem: Coping: Goal: Level of anxiety will decrease Outcome: Adequate for Discharge   Problem: Elimination: Goal: Will not experience complications related to bowel motility Outcome: Adequate for Discharge Goal: Will not experience complications related to urinary retention Outcome: Adequate for Discharge   Problem: Pain Managment: Goal: General experience of comfort will improve Outcome: Adequate for Discharge   Problem: Safety: Goal: Ability to remain free from injury will improve Outcome: Adequate for Discharge   Problem: Skin Integrity: Goal: Risk for impaired skin integrity will decrease Outcome: Adequate for Discharge   Problem: Education: Goal: Ability to demonstrate management of disease process will improve Outcome: Adequate for Discharge Goal: Ability to verbalize understanding of medication therapies will improve Outcome: Adequate for Discharge Goal: Individualized Educational Video(s) Outcome: Adequate for Discharge   Problem: Activity: Goal: Capacity to carry out activities will improve Outcome: Adequate for Discharge   Problem: Cardiac: Goal: Ability to achieve and maintain adequate cardiopulmonary perfusion will improve Outcome: Adequate for Discharge   

## 2022-07-30 NOTE — Progress Notes (Signed)
   Heart Failure Stewardship Pharmacist Progress Note   PCP: Chauncey Mann, DO PCP-Cardiologist: Olga Millers, MD    HPI:  72 yo F with PMH of CHF, T2DM, CAD s/p CABG, and CKD IV.   ECHO 01/2014 with EF 25-30%. Cath revealed 3 vessel disease. S/p CABG. Referred to EP for ICD but patient declined.   Admitted 08/2017 with CHF and diuresed. EF 25%. Discharged on lasix 80 mg daily. Repeat ECHO improved some to 30-35% in 03/2019 and 05/2020. Back down to 25-30% 09/2021.  Last seen by cardiology 03/2022. BP 92/50. Reported chronic mild edema and some dyspnea on exertion. No medication changes made. Repeat ECHO 05/2022 with EF <20%, global hypokinesis, G1DD, RV normal, moderate MR.   Presented to the ED on 6/27 with shortness of breath, LE edema, orthopnea. Reported not taking her lasix for several days as she does not believe it was working. She also admits to not taking any of her medications over the last month, and dietary/fluid noncompliance. She stated that she is not interested in pill boxes. She did not forget to take medications, but chose not to take them. CXR showed bilateral pleural effusions with associated bibasilar atelectasis.   Current HF Medications: Diuretic: furosemide 40 mg IV TID  Prior to admission HF Medications: None Prescribed carvedilol 25 mg BID, Jardiance 25 mg daily, lasix 80 mg BID, metolazone 5 mg PRN, Entresto 24/26 mg BID  Pertinent Lab Values: Serum creatinine 2.33, BUN 38, Potassium 4.9, Sodium 137, Magnesium 1.9, BNP 2937.9, A1c 7.9   Vital Signs: Weight: 169 lbs (admission weight: 175 lbs) Blood pressure: 110-120/70s  Heart rate: 80s  I/O: -0.4L yesterday; net -4L  Medication Assistance / Insurance Benefits Check: Does the patient have prescription insurance?  Yes Type of insurance plan: [facility] Medicare  Outpatient Pharmacy:  Prior to admission [facility] Is the patient willing to use [facility] T[facility] pharmacy at discharge? Yes Is the  patient willing to transition their outpatient pharmacy to utilize a [facility] outpatient pharmacy?   No    Assessment: 1. Acute on chronic systolic CHF (LVEF <20%), due to ICM, noncompliance. NYHA class II symptoms. - On furosemide 40 mg IV TID - may be able to transition to PO. Strict I/Os and daily weights. Keep K>4 and Mg>2. - Holding BB as she has been off of this for about a month. Would resume today now that she is more euvolemic. - Consider restarting Entresto and Jardiance if renal function improves. eGFR <30   Plan: 1) Medication changes recommended at this time: - Transition IV to PO lasix - Start carvedilol 6.25 mg BID  2) Patient assistance: - Entresto copay $0 - Jardiance copay $0 - She states she is willing to take medications after discharge  3)  Education  - Patient has been educated on current HF medications and potential additions to HF medication regimen - Patient verbalizes understanding that over the next few months, these medication doses may change and more medications may be added to optimize HF regimen - Patient has been educated on basic disease state pathophysiology and goals of therapy   Sharen Hones, PharmD, BCPS Heart Failure Stewardship Pharmacist Phone [phone]

## 2022-07-31 ENCOUNTER — Telehealth: Payer: Self-pay

## 2022-07-31 NOTE — Transitions of Care (Post Inpatient/ED Visit) (Signed)
   07/31/2022  Name: [name] MRN: [id number] DOB: 04-22-1950  Today's TOC FU Call Status: Today's TOC FU Call Status:: Successful TOC FU Call Competed TOC FU Call Complete Date: 07/31/22  Transition Care Management Follow-up Telephone Call Date of Discharge: 07/30/22 Discharge Facility: [facility]) Type of Discharge: Inpatient Admission Primary Inpatient Discharge Diagnosis:: heart failure How have you been since you were released from the hospital?: Better Any questions or concerns?: No  Items Reviewed: Did you receive and understand the discharge instructions provided?: No Medications obtained,verified, and reconciled?: Yes (Medications Reviewed) Any new allergies since your discharge?: No Dietary orders reviewed?: Yes  Medications Reviewed Today: Medications Reviewed Today     Reviewed by Karena Addison, LPN (Licensed Practical Nurse) on 07/31/22 at 1003  Med List Status: <None>   Medication Order Taking? Sig Documenting Provider Last Dose Status Informant  aspirin 81 MG tablet [id number] No Take 1 tablet (81 mg total) by mouth daily. Lewayne Bunting, MD Past Month Active Self  atorvastatin (LIPITOR) 80 MG tablet [id number] No TAKE 1 TABLET BY MOUTH DAILY Crenshaw, Madolyn Frieze, MD Past Month Active Self  calcium carbonate (OSCAL) 1500 (600 Ca) MG TABS tablet [id number] No Take 1,500 mg by mouth daily. [provider] Past Month Active Self  carvedilol (COREG) 25 MG tablet [id number]  Take 1 tablet (25 mg total) by mouth 2 (two) times daily with a meal. Atway, Rayann N, DO  Active   empagliflozin (JARDIANCE) 25 MG TABS tablet [id number] No TAKE 1 TABLET BY MOUTH DAILY Doran Stabler, DO Past Month Active Self  ezetimibe (ZETIA) 10 MG tablet [id number] No TAKE ONE TABLET BY MOUTH DAILY Jens Som Madolyn Frieze, MD Past Month Active Self  furosemide (LASIX) 40 MG tablet [id number]  Take 1 tablet (40 mg total) by mouth 2 (two) times daily. Morrie Sheldon, MD  Active   Insulin  Pen Needle (PEN NEEDLES) 32G X 4 MM MISC [id number] No 1 each by Does not apply route as directed. Chauncey Mann, DO 05/16/2022 Active Self  sacubitril-valsartan (ENTRESTO) 24-26 MG [id number] No Take 1 tablet by mouth 2 (two) times daily. Lewayne Bunting, MD Past Month Active Self  Semaglutide,0.25 or 0.5MG /DOS, 2 MG/3ML SOPN [id number] No Inject 0.5 mg into the skin once a week. Chauncey Mann, DO Past Month Active Self           Med Note Lenor Derrick   Thu Jul 26, 2022  3:27 PM)              Home Care and Equipment/Supplies: Were Home Health Services Ordered?: NA Any new equipment or medical supplies ordered?: NA  Functional Questionnaire: Do you need assistance with bathing/showering or dressing?: No Do you need assistance with meal preparation?: No Do you need assistance with eating?: No Do you have difficulty maintaining continence: No Do you need assistance with getting out of bed/getting out of a chair/moving?: No Do you have difficulty managing or taking your medications?: No  Follow up appointments reviewed: PCP Follow-up appointment confirmed?: Yes Date of PCP follow-up appointment?: 08/07/22 Follow-up Provider: [facility] Follow-up appointment confirmed?: NA Do you need transportation to your follow-up appointment?: No Do you understand care options if your condition(s) worsen?: Yes-patient verbalized understanding    SIGNATURE Karena Addison, LPN [facility] Nurse Health Advisor Direct Dial [phone]

## 2022-08-01 ENCOUNTER — Encounter: Payer: Self-pay | Admitting: Student

## 2022-08-07 ENCOUNTER — Ambulatory Visit (INDEPENDENT_AMBULATORY_CARE_PROVIDER_SITE_OTHER): Payer: 59 | Admitting: Student

## 2022-08-07 ENCOUNTER — Other Ambulatory Visit: Payer: Self-pay | Admitting: Student

## 2022-08-07 VITALS — BP 112/60 | HR 79 | Temp 98.1°F | Wt 169.3 lb

## 2022-08-07 DIAGNOSIS — I10 Essential (primary) hypertension: Secondary | ICD-10-CM | POA: Diagnosis not present

## 2022-08-07 DIAGNOSIS — I5022 Chronic systolic (congestive) heart failure: Secondary | ICD-10-CM | POA: Diagnosis not present

## 2022-08-07 DIAGNOSIS — N184 Chronic kidney disease, stage 4 (severe): Secondary | ICD-10-CM | POA: Diagnosis not present

## 2022-08-07 DIAGNOSIS — E1122 Type 2 diabetes mellitus with diabetic chronic kidney disease: Secondary | ICD-10-CM | POA: Diagnosis not present

## 2022-08-07 DIAGNOSIS — I130 Hypertensive heart and chronic kidney disease with heart failure and stage 1 through stage 4 chronic kidney disease, or unspecified chronic kidney disease: Secondary | ICD-10-CM | POA: Diagnosis not present

## 2022-08-07 DIAGNOSIS — E785 Hyperlipidemia, unspecified: Secondary | ICD-10-CM

## 2022-08-07 MED ORDER — SEMAGLUTIDE(0.25 OR 0.5MG/DOS) 2 MG/3ML ~~LOC~~ SOPN: 0.5000 mg | PEN_INJECTOR | SUBCUTANEOUS | 2 refills | Status: DC

## 2022-08-07 NOTE — Assessment & Plan Note (Signed)
Patient has a history of chronic systolic heart failure,recently admitted for exacerbation.  Patient however states she is doing well today denies any shortness of breath chest pain.  Do however still have pitting edema on exam today, reports she did not take her Lasix this morning prior to coming to the office.  Plan :     - Continue on Entresto 24-26 MG     - Continue Lasix 40 mg by mouth 2 times daily     - Follow-up in 4 weeks to reassess lower extremity edema

## 2022-08-07 NOTE — Progress Notes (Signed)
CC: Annual wellness visit and recent hospital admission  follow up  HPI:  [name] is a 72 y.o. female living with a history stated below and presents today for annual wellness visit and recent hospitalization follow up. Please see problem based assessment and plan for additional details.  Past Medical History:  Diagnosis Date   Acute exacerbation of CHF (congestive heart failure) (HCC) 09/12/2017   Acute gallstone pancreatitis    Acute gallstone pancreatitis    Acute on chronic systolic heart failure (HCC)    Bilateral pleural effusion 02/22/2014   Cardiomyopathy, ischemic, EF 25-30% 03/23/2014   03/01/2014- Date of procedure- CABG- X5- Severe 3-vessel coronary artery disease with severe LV dysfunction.  Ejection fraction 25-30%.  SURGICAL PROCEDURE:  Coronary artery bypass grafting x 5. SURGEON:  Sheliah Plane, MD.      Cardiomyopathy, ischemic, EF 25-30% 03/23/2014   03/01/2014- Date of procedure- CABG- X5- Severe 3-vessel coronary artery disease with severe LV dysfunction.  Ejection fraction 25-30%.  SURGICAL PROCEDURE:  Coronary artery bypass grafting x 5. SURGEON:  Sheliah Plane, MD.      Chronic systolic heart failure [facility])    Coronary artery disease involving native coronary artery of native heart without angina pectoris    Diabetes mellitus 1998   Dx in 1998. Microalbuminuria, never on insulin.   Diabetes mellitus type 2, controlled, without complications (HCC) 12/05/2005   Last A1C 6.7 (03/21/17)    On Diet Control Only Last Foot Exam: 03/21/17    Last eye exam: to be scheduled early 2019    Dyspnea    Essential hypertension 12/05/2005   Lisinopril 40mg  Daily, Coreg 25mg  BID.    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     atorvastatin (LIPITOR) 80 MG tablet TAKE 1 TABLET BY MOUTH DAILY 90 tablet 3   calcium carbonate (OSCAL) 1500 (600 Ca) MG TABS tablet Take 1,500 mg by mouth daily.     carvedilol (COREG) 25 MG tablet Take 1 tablet (25 mg total) by mouth 2 (two) times daily with a meal. 60 tablet 0   empagliflozin (JARDIANCE) 25 MG TABS tablet TAKE 1 TABLET BY MOUTH DAILY 90 tablet 2   ezetimibe (ZETIA) 10 MG tablet TAKE ONE TABLET BY MOUTH DAILY 90 tablet 3   furosemide (LASIX) 40 MG tablet Take 1 tablet (40 mg total) by mouth 2 (two) times daily. 60 tablet 0   Insulin Pen Needle (PEN NEEDLES) 32G X 4 MM MISC 1 each by Does not apply route as directed. 100 each 1   sacubitril-valsartan (ENTRESTO) 24-26 MG Take 1 tablet by mouth 2 (two) times daily. 180 tablet 3   No current facility-administered medications on file prior to visit.    Family History  Problem Relation Age of Onset   Diabetes Mother        Mother died of MI at age 29   Heart failure Mother    Kidney failure  Mother    Heart failure Sister        Died   Heart failure Brother        Died   Diabetes Brother        Died   Breast cancer Neg Hx     Social History   Socioeconomic History   Marital status: Widowed    Spouse name: Not on file   Number of children: 2   Years of education: Not on file   Highest education level: 9th grade  Occupational History   Occupation: Armed forces training and education officer   Occupation: Retired  Tobacco Use   Smoking status: Never   Smokeless tobacco: Never  Building services engineer Use: Never used  Substance and Sexual Activity   Alcohol use: No    Alcohol/week: 0.0 standard drinks of alcohol   Drug use: No   Sexual activity: Not on file  Other Topics Concern   Not on file   Social History Narrative   Married x 42 yrs.  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   Never smoked. Doesn't drink or use drugs.   Does Patient Exercise:  yes - sometimes      To get diabetes supplies for Antonietta Barcelona RD, CDE, April 20,2011   Social Determinants of Health   Financial Resource Strain: Low Risk  (07/27/2022)   Overall Financial Resource Strain (CARDIA)    Difficulty of Paying Living Expenses: Not very hard  Food Insecurity: No Food Insecurity (07/27/2022)   Hunger Vital Sign    Worried About Running Out of Food in the Last Year: Never true    Ran Out of Food in the Last Year: Never true  Transportation Needs: No Transportation Needs (07/27/2022)   PRAPARE - Administrator, Civil Service (Medical): No    Lack of Transportation (Non-Medical): No  Physical Activity: Inactive (01/10/2022)   Exercise Vital Sign    Days of Exercise per Week: 0 days    Minutes of Exercise per Session: 0 min  Stress: No Stress Concern Present (01/10/2022)   [employer] of Occupational Health - Occupational Stress Questionnaire    Feeling of Stress : Only a little  Social Connections: Socially Isolated (01/10/2022)   Social Connection and Isolation Panel [NHANES]    Frequency of Communication with Friends and Family: More than three times a week    Frequency of Social Gatherings with Friends and Family: More than three times a week    Attends Religious Services: Never    Database administrator or Organizations: No    Attends Banker Meetings: Never    Marital Status: Widowed  Intimate Partner Violence: Not At Risk (01/10/2022)   Humiliation, Afraid, Rape, and Kick questionnaire    Fear of Current or Ex-Partner: No    Emotionally Abused: No    Physically Abused: No    Sexually Abused: No    Review of Systems: ROS negative except for what is noted on the assessment and plan.  Vitals:   08/07/22  1025  BP: 112/60  Pulse: 79  Temp: 98.1 F (36.7 C)  TempSrc: Oral  SpO2: 100%  Weight: 169 lb 4.8 oz (76.8 kg)   Physical Exam: Constitutional: NAD Cardiovascular: regular rate and rhythm, no m/r/g Pulmonary/Chest: normal work of breathing on room air, lungs clear to auscultation bilaterally MSK: Pitting edema noted in the lower extremities bilaterally neurological: alert & oriented x 3, no focal  deficit   Assessment & Plan:   CKD (chronic kidney disease) stage 4, GFR 15-29 ml/min (HCC) History of CKD. Follows up with a nephrologist regularly. Denies any issues with urination. Currently on Entresto, Jardiance, Zetia and Laxis. Last BMP showed a Cr level of 2.33. Plan :  - Recheck BMP today - Avoid all nephrotoxic agents if possible - Follow up in 4 weeks   Hyperlipidemia History of hyperlipidemia currently on Lipitor 80 mg.  Lipid panel done in March this year showed LDL at goal of 65.  No changes in the current regimen is recommended at this time.   Type 2 diabetes mellitus with stage 4 chronic kidney disease, without long-term current use of insulin (HCC) History of type 2 diabetes currently on Jardiance.  Recent A1c was 11.1 from 8.5.  Denies any polydipsia or polyuria.   Plan:      - Restart Ozempic 0.25 mg/week x 4 weeks followed by 0.5 mg/week       - Continue Jardiance 25 mg        Chronic systolic heart failure (HCC) Patient has a history of chronic systolic heart failure,recently admitted for exacerbation.  Patient however states she is doing well today denies any shortness of breath chest pain.  Do however still have pitting edema on exam today, reports she did not take her Lasix this morning prior to coming to the office.  Plan :     - Continue on Entresto 24-26 MG     - Continue Lasix 40 mg by mouth 2 times daily     - Follow-up in 4 weeks to reassess lower extremity edema   Patient seen with Dr. Bayard Beaver, M.D [facility] Internal  Medicine Phone: [phone] Date 08/07/2022 Time 11:47 AM

## 2022-08-07 NOTE — Assessment & Plan Note (Signed)
History of CKD. Follows up with a nephrologist regularly. Denies any issues with urination. Currently on Entresto, Jardiance, Zetia and Laxis. Last BMP showed a Cr level of 2.33. Plan :  - Recheck BMP today - Avoid all nephrotoxic agents if possible - Follow up in 4 weeks

## 2022-08-07 NOTE — Patient Instructions (Addendum)
Thank you, [name] for allowing [place] to provide your care today. Today we discussed your general health, recent hospitalization and your activities of daily living .   - We will restart your Ozempic  - I will follow up with you on your home therapy  - I will see you again in 4 weeks  - Please don't hesitate to call me if you have have any other concerns   I have ordered the following labs for you:  Lab Orders         BMP8+Anion Gap      Referrals ordered today:   Referral Orders  No referral(s) requested today     I have ordered the following medication/changed the following medications:   Stop the following medications: There are no discontinued medications.   Start the following medications: No orders of the defined types were placed in this encounter.    Follow up:  4 weeks     Remember: To take all your medications   Should you have any questions or concerns please call the internal medicine clinic at [phone].    Kathleen Lime, M.D [facility]

## 2022-08-07 NOTE — Assessment & Plan Note (Signed)
History of type 2 diabetes currently on Jardiance.  Recent A1c was 11.1 from 8.5.  Denies any polydipsia or polyuria.   Plan:      - Restart Ozempic 0.25 mg/week x 4 weeks followed by 0.5 mg/week       - Continue Jardiance 25 mg

## 2022-08-07 NOTE — Assessment & Plan Note (Signed)
History of hyperlipidemia currently on Lipitor 80 mg.  Lipid panel done in March this year showed LDL at goal of 65.  No changes in the current regimen is recommended at this time.

## 2022-08-08 LAB — BMP8+ANION GAP
Anion Gap: 15 mmol/L (ref 10.0–18.0)
BUN/Creatinine Ratio: 17 (ref 12–28)
BUN: 36 mg/dL — ABNORMAL HIGH (ref 8–27)
CO2: 27 mmol/L (ref 20–29)
Calcium: 9.2 mg/dL (ref 8.7–10.3)
Chloride: 98 mmol/L (ref 96–106)
Creatinine, Ser: 2.12 mg/dL — ABNORMAL HIGH (ref 0.57–1.00)
Glucose: 243 mg/dL — ABNORMAL HIGH (ref 70–99)
Potassium: 4.7 mmol/L (ref 3.5–5.2)
Sodium: 140 mmol/L (ref 134–144)
eGFR: 24 mL/min/{1.73_m2} — ABNORMAL LOW (ref >59)

## 2022-08-10 ENCOUNTER — Telehealth (HOSPITAL_COMMUNITY): Payer: Self-pay

## 2022-08-10 NOTE — Telephone Encounter (Signed)
Called to confirm Heart & Vascular Transitions of Care appointment. Patient reminded to bring all medications and pill box organizer with them. Confirmed patient has transportation. Gave directions, instructed to utilize valet parking.  Confirmed appointment prior to ending call.   

## 2022-08-10 NOTE — Progress Notes (Signed)
Internal Medicine Clinic Attending  I was physically present during the key portions of the resident provided service and participated in the medical decision making of patient's management care. I reviewed pertinent patient test results.  The assessment, diagnosis, and plan were formulated together and I agree with the documentation in the resident's note.  Cadan Maggart, MD  

## 2022-08-10 NOTE — Addendum Note (Signed)
Addended by: Earl Lagos on: 08/10/2022 10:19 AM   Modules accepted: Level of Service

## 2022-08-13 ENCOUNTER — Ambulatory Visit (HOSPITAL_COMMUNITY)
Admit: 2022-08-13 | Discharge: 2022-08-13 | Disposition: A | Discharge status: Home / Self Care | Payer: 59 | Source: Ambulatory Visit | Attending: Adult Health | Admitting: Adult Health

## 2022-08-13 ENCOUNTER — Encounter (HOSPITAL_COMMUNITY): Payer: Self-pay

## 2022-08-13 VITALS — BP 126/84 | HR 81 | Wt 167.8 lb

## 2022-08-13 DIAGNOSIS — I5022 Chronic systolic (congestive) heart failure: Secondary | ICD-10-CM | POA: Insufficient documentation

## 2022-08-13 DIAGNOSIS — I2510 Atherosclerotic heart disease of native coronary artery without angina pectoris: Secondary | ICD-10-CM | POA: Diagnosis not present

## 2022-08-13 DIAGNOSIS — I255 Ischemic cardiomyopathy: Secondary | ICD-10-CM

## 2022-08-13 DIAGNOSIS — I447 Left bundle-branch block, unspecified: Secondary | ICD-10-CM

## 2022-08-13 DIAGNOSIS — N184 Chronic kidney disease, stage 4 (severe): Secondary | ICD-10-CM | POA: Diagnosis not present

## 2022-08-13 MED ORDER — ENTRESTO 24-26 MG PO TABS
1.0000 | ORAL_TABLET | Freq: Two times a day (BID) | ORAL | 3 refills | Status: DC
Start: 2022-08-13 — End: 2022-11-28

## 2022-08-13 MED ORDER — ASPIRIN 81 MG PO TBEC: 81.0000 mg | DELAYED_RELEASE_TABLET | Freq: Every day | ORAL | 3 refills | Status: DC

## 2022-08-13 NOTE — Progress Notes (Signed)
HEART & VASCULAR TRANSITION OF CARE CONSULT NOTE     Referring Physician: Dr Lafonda Mosses  Primary Care: Dr Ned Card Primary Cardiologist: Dr Jens Som   HPI: Referred to clinic by Dr Lafonda Mosses for heart failure consultation.   [name] is a 72 year old with a history of CAD, CABG 2016, DMII, HTN, HLD, CKD stage IV, and LBBB.   In 2023 Dr Jens Som discussed ICD/CRT-D but she did not want to pursue. EF reduced for several years.    Admitted 07/26/22 with A/C HFrEF. Medication compliance seemed to be major contributing factor. She stopped taking her medications for a month.  Diuresed with IV lasix and transitioned to lasix 40 mg twice a day. Discharged on bb, arni, and. Sglt2i. No MRA with CKD Stage IIIb.   Today she returns for HF follow up.Overall feeling fine. Denies SOB/PND/Orthopnea. Able to walk up steps. No chest pain. Appetite ok. No fever or chills. She has not been weighing at home. Taking all medications except she ran out of entresto and aspirin 4 days ago.  Lives with daughter and grandson. Family provides transportation to appointments.     Cardiac Testing  Echo 05/2022   1. LV global hypokinesis with septal akinesis. Left ventricular ejection  fraction, by estimation, is <20%. The left ventricle has severely  decreased function. The left ventricle demonstrates global hypokinesis.  Left ventricular diastolic parameters are  consistent with Grade I diastolic dysfunction (impaired relaxation).   2. Right ventricular systolic function is normal. The right ventricular  size is normal. There is normal pulmonary artery systolic pressure. The  estimated right ventricular systolic pressure is 19.8 mmHg.   3. Left atrial size was moderately dilated.   4. The mitral valve is abnormal. Moderate mitral valve regurgitation. No  evidence of mitral stenosis.   5. The aortic valve is grossly normal. There is moderate calcification of  the aortic valve. Aortic valve regurgitation is not  visualized. Aortic  valve sclerosis is present, with no evidence of aortic valve stenosis.   Echo 2023   1. Left ventricular ejection fraction, by estimation, is 25 to 30%. The  left ventricle has severely decreased function. The left ventricle  demonstrates regional wall motion abnormalities (septal akinesis). Left  ventricular diastolic parameters are  indeterminate.   2. Right ventricular systolic function is low normal. The right  ventricular size is normal. There is normal pulmonary artery systolic  pressure.   3. The mitral valve is abnormal. Severe mitral valve regurgitation. No  evidence of mitral stenosis.   4. The aortic valve is tricuspid. There is moderate calcification of the  aortic valve. Aortic valve regurgitation is not visualized. No aortic  stenosis is present.   Echo 2022   1. Left ventricular ejection fraction, by estimation, is 30 to 35%. The  left ventricle has moderately decreased function. The left ventricle  demonstrates global hypokinesis. Left ventricular diastolic parameters are  consistent with Grade II diastolic  dysfunction (pseudonormalization).   2. Right ventricular systolic function is low normal. The right  ventricular size is normal. There is normal pulmonary artery systolic  pressure. The estimated right ventricular systolic pressure is 20.6 mmHg.   3. Left atrial size was mildly dilated.   4. The mitral valve is grossly normal. Mild to moderate mitral valve  regurgitation. No evidence of mitral stenosis.   5. The aortic valve is tricuspid. There is mild calcification of the  aortic valve. There is mild thickening of the aortic valve. Aortic valve  Review of Systems: [y] = yes, [ ]  = no   General: Weight gain [ ] ; Weight loss [ ] ; Anorexia [ ] ; Fatigue [ ] ; Fever [ ] ; Chills [ ] ; Weakness [ ]   Cardiac: Chest pain/pressure [ ] ; Resting SOB [ ] ; Exertional SOB [ ] ; Orthopnea [ ] ; Pedal Edema [ ] ; Palpitations [ ] ; Syncope [ ] ; Presyncope [ ] ;  Paroxysmal nocturnal dyspnea[ ]   Pulmonary: Cough [ ] ; Wheezing[ ] ; Hemoptysis[ ] ; Sputum [ ] ; Snoring [ ]   GI: Vomiting[ ] ; Dysphagia[ ] ; Melena[ ] ; Hematochezia [ ] ; Heartburn[ ] ; Abdominal pain [ ] ; Constipation [ ] ; Diarrhea [ ] ; BRBPR [ ]   GU: Hematuria[ ] ; Dysuria [ ] ; Nocturia[ ]   Vascular: Pain in legs with walking [ ] ; Pain in feet with lying flat [ ] ; Non-healing sores [ ] ; Stroke [ ] ; TIA [ ] ; Slurred speech [ ] ;  Neuro: Headaches[ ] ; Vertigo[ ] ; Seizures[ ] ; Paresthesias[ ] ;Blurred vision [ ] ; Diplopia [ ] ; Vision changes [ ]   Ortho/Skin: Arthritis [ ] ; Joint pain [Y ]; Muscle pain [ ] ; Joint swelling [ ] ; Back Pain [Y ]; Rash [ ]   Psych: Depression[ ] ; Anxiety[ ]   Heme: Bleeding problems [ ] ; Clotting disorders [ ] ; Anemia [ ]   Endocrine: Diabetes [ Y]; Thyroid dysfunction[ ]    Past Medical History:  Diagnosis Date   Acute exacerbation of CHF (congestive heart failure) (HCC) 09/12/2017   Acute gallstone pancreatitis    Acute gallstone pancreatitis    Acute on chronic systolic heart failure (HCC)    Bilateral pleural effusion 02/22/2014   Cardiomyopathy, ischemic, EF 25-30% 03/23/2014   03/01/2014- Date of procedure- CABG- X5- Severe 3-vessel coronary artery disease with severe LV dysfunction.  Ejection fraction 25-30%.  SURGICAL PROCEDURE:  Coronary artery bypass grafting x 5. SURGEON:  Sheliah Plane, MD.      Cardiomyopathy, ischemic, EF 25-30% 03/23/2014   03/01/2014- Date of procedure- CABG- X5- Severe 3-vessel coronary artery disease with severe LV dysfunction.  Ejection fraction 25-30%.  SURGICAL PROCEDURE:  Coronary artery bypass grafting x 5. SURGEON:  Sheliah Plane, MD.      Chronic systolic heart failure [facility])    Coronary artery disease involving native coronary artery of native heart without angina pectoris    Diabetes mellitus 1998   Dx in 1998. Microalbuminuria, never on insulin.   Diabetes mellitus type 2, controlled, without complications (HCC) 12/05/2005   Last A1C  6.7 (03/21/17)    On Diet Control Only Last Foot Exam: 03/21/17    Last eye exam: to be scheduled early 2019    Dyspnea    Essential hypertension 12/05/2005   Lisinopril 40mg  Daily, Coreg 25mg  BID.    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   Viral URI with cough 03/21/2017    Current Outpatient Medications  Medication Sig Dispense Refill   aspirin 81 MG tablet Take 1 tablet (81 mg total) by mouth daily.     atorvastatin (LIPITOR) 80 MG tablet TAKE 1 TABLET BY MOUTH DAILY 90 tablet 3   calcium carbonate (OSCAL) 1500 (600 Ca) MG TABS tablet Take 1,500 mg by mouth daily.     carvedilol (COREG)  25 MG tablet Take 1 tablet (25 mg total) by mouth 2 (two) times daily with a meal. 60 tablet 0   empagliflozin (JARDIANCE) 25 MG TABS tablet TAKE 1 TABLET BY MOUTH DAILY 90 tablet 2   ezetimibe (ZETIA) 10 MG tablet TAKE ONE TABLET BY MOUTH DAILY 90 tablet 3   furosemide (LASIX) 40 MG tablet Take 1 tablet (40 mg total) by mouth 2 (two) times daily. 60 tablet 0   Insulin Pen Needle (PEN NEEDLES) 32G X 4 MM MISC 1 each by Does not apply route as directed. 100 each 1   sacubitril-valsartan (ENTRESTO) 24-26 MG Take 1 tablet by mouth 2 (two) times daily. 180 tablet 3   Semaglutide,0.25 or 0.5MG /DOS, 2 MG/3ML SOPN Inject 0.5 mg into the skin once a week. 3 mL 2   No current facility-administered medications for this encounter.    No Known Allergies    Social History   Socioeconomic History   Marital status: Widowed    Spouse name: Not on file   Number of children: 2   Years of education: Not on file   Highest education level: 9th grade  Occupational History   Occupation: Armed forces training and education officer   Occupation:  Retired  Tobacco Use   Smoking status: Never   Smokeless tobacco: Never  Vaping Use   Vaping status: Never Used  Substance and Sexual Activity   Alcohol use: No    Alcohol/week: 0.0 standard drinks of alcohol   Drug use: No   Sexual activity: Not on file  Other Topics Concern   Not on file  Social History Narrative   Married x 42 yrs.  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   Never smoked. Doesn't drink or use drugs.   Does Patient Exercise:  yes - sometimes      To get diabetes supplies for Antonietta Barcelona RD, CDE, 2022-06-02   Social Determinants of Health   Financial Resource Strain: Low Risk  (07/27/2022)   Overall Financial Resource Strain (CARDIA)    Difficulty of Paying Living Expenses: Not very hard  Food Insecurity: No Food Insecurity (07/27/2022)   Hunger Vital Sign    Worried About Running Out of Food in the Last Year: Never true    Ran Out of Food in the Last Year: Never true  Transportation Needs: No Transportation Needs (07/27/2022)   PRAPARE - Administrator, Civil Service (Medical): No    Lack of Transportation (Non-Medical): No  Physical Activity: Inactive (01/10/2022)   Exercise Vital Sign    Days of Exercise per Week: 0 days    Minutes of Exercise per Session: 0 min  Stress: No Stress Concern Present (01/10/2022)   [employer] of Occupational Health - Occupational Stress Questionnaire    Feeling of Stress : Only a little  Social Connections: Socially Isolated (01/10/2022)   Social Connection and Isolation Panel [NHANES]    Frequency of Communication with Friends and Family: More than three times a week    Frequency of Social Gatherings with Friends and Family: More than three times a week    Attends Religious Services: Never    Database administrator or Organizations: No    Attends Banker Meetings: Never    Marital Status: Widowed  Intimate Partner Violence: Not  At Risk (01/10/2022)   Humiliation, Afraid, Rape, and Kick questionnaire    Fear of Current or Ex-Partner: No    Emotionally Abused:  No    Physically Abused: No    Sexually Abused: No      Family History  Problem Relation Age of Onset   Diabetes Mother        Mother died of MI at age 34   Heart failure Mother    Kidney failure Mother    Heart failure Sister        Died   Heart failure Brother        Died   Diabetes Brother        Died   Breast cancer Neg Hx     Vitals:   08/13/22 1502  BP: 126/84  Pulse: 81  SpO2: 97%  Weight: 76.1 kg (167 lb 12.8 oz)   Wt Readings from Last 3 Encounters:  08/13/22 76.1 kg (167 lb 12.8 oz)  08/07/22 76.8 kg (169 lb 4.8 oz)  07/30/22 76.8 kg (169 lb 6.4 oz)    PHYSICAL EXAM: General:  Walked in the clinic.  No respiratory difficulty HEENT: normal Neck: supple. no JVD. Carotids 2+ bilat; no bruits. No lymphadenopathy or thryomegaly appreciated. Cor: PMI nondisplaced. Regular rate & rhythm. No rubs, gallops. LLSB 2/6 MR.  Lungs: clear Abdomen: soft, nontender, nondistended. No hepatosplenomegaly. No bruits or masses. Good bowel sounds. Extremities: no cyanosis, clubbing, rash, edema Neuro: alert & oriented x 3, cranial nerves grossly intact. moves all 4 extremities w/o difficulty. Affect pleasant.  ECG:SR 80 bpm  LBBB 158 Julia    ASSESSMENT & PLAN: 1. Chronic HFrEF, ICM  Echo 2024 LVEF, 20% . EF has been  < 35% for several years. ICD has been discussed by Dr Jens Som many times but  she does not want to pursue and only wants to take medications. She wants to avoid invasive procedures.  NYHA II. Appears euvolemic.  GDMT  Diuretic- Continue lasix 40 mg twice a day.  BB-Continue coreg 25 mg twice a day.  Ace/ARB/ARNI- Sherryll Burger 24-26 mg twice a day.  MRA- No CKD Stage IIIa SGLT2i- Continue jardiance 10 mg daily  Reinforced medication compliance.   2. CAD H/O CABG LIMA to LAD, seq svg to first and second diagonal, svg to OM, svg  to PDA.  No chest pain.  Restart aspirin 81 mg daily. Continue statin and bb.   3. CKD Stage IV -Creatinine baseline 1.9-2.1 -I reviewed creatinine from 08/07/2022 , stable.   3. LBBB  QRS 158s   4. DMII  Hgb A1C 7.9  On SGLT2i  Referred to HFSW (PCP, Medications, Transportation, ETOH Abuse, Drug Abuse, Insurance, Financial ):  No Refer to Pharmacy: No Refer to Home Health:No Refer to [facility]:  no  Refer to General Cardiology: She is Dr Waunita Schooner patient.   Follow up as needed.  Gracianna Vink NP-C  4:32 PM

## 2022-08-13 NOTE — Patient Instructions (Signed)
Medication Changes:  PLEASE PICK UP MEDICATIONS FROM PHARMACY   Follow-Up in: AS NEEDED WITH OUR OFFICE   At the [facility], you and your health needs are our priority. We have a designated team specialized in the treatment of Heart Failure. This Care Team includes your primary Heart Failure Specialized Cardiologist (physician), Advanced Practice Providers (APPs- Physician Assistants and Nurse Practitioners), and Pharmacist who all work together to provide you with the care you need, when you need it.   You may see any of the following providers on your designated Care Team at your next follow up:  Dr. Arvilla Meres Dr. Marca Ancona Dr. Marcos Eke, NP Robbie Lis, [place] [facility] [place], [place] Brynda Peon, NP Karle Plumber, PharmD   Please be sure to bring in all your medications bottles to every appointment.   Need to Contact us:  If you have any questions or concerns before your next appointment please send [place] a message through [place] or call our office at [phone].    TO LEAVE A MESSAGE FOR THE NURSE SELECT OPTION 2, PLEASE LEAVE A MESSAGE INCLUDING: YOUR NAME DATE OF BIRTH CALL BACK NUMBER REASON FOR CALL**this is important as we prioritize the call backs  YOU WILL RECEIVE A CALL BACK THE SAME DAY AS LONG AS YOU CALL BEFORE 4:00 PM

## 2022-09-04 ENCOUNTER — Ambulatory Visit (INDEPENDENT_AMBULATORY_CARE_PROVIDER_SITE_OTHER): Payer: 59

## 2022-09-04 ENCOUNTER — Encounter: Payer: Self-pay | Admitting: Student

## 2022-09-04 ENCOUNTER — Ambulatory Visit (INDEPENDENT_AMBULATORY_CARE_PROVIDER_SITE_OTHER): Payer: 59 | Admitting: Student

## 2022-09-04 VITALS — BP 103/45 | HR 80 | Temp 97.7°F | Ht 62.0 in | Wt 173.2 lb

## 2022-09-04 VITALS — BP 103/45 | HR 80 | Temp 97.9°F | Ht 62.0 in | Wt 173.2 lb

## 2022-09-04 DIAGNOSIS — Z0000 Encounter for general adult medical examination without abnormal findings: Secondary | ICD-10-CM

## 2022-09-04 DIAGNOSIS — Z7985 Long-term (current) use of injectable non-insulin antidiabetic drugs: Secondary | ICD-10-CM

## 2022-09-04 DIAGNOSIS — Z7984 Long term (current) use of oral hypoglycemic drugs: Secondary | ICD-10-CM | POA: Diagnosis not present

## 2022-09-04 DIAGNOSIS — I5020 Unspecified systolic (congestive) heart failure: Secondary | ICD-10-CM | POA: Insufficient documentation

## 2022-09-04 DIAGNOSIS — E1122 Type 2 diabetes mellitus with diabetic chronic kidney disease: Secondary | ICD-10-CM

## 2022-09-04 DIAGNOSIS — I509 Heart failure, unspecified: Secondary | ICD-10-CM

## 2022-09-04 DIAGNOSIS — N184 Chronic kidney disease, stage 4 (severe): Secondary | ICD-10-CM | POA: Diagnosis not present

## 2022-09-04 NOTE — Assessment & Plan Note (Signed)
Recently hospitalize for acute on chronic HF. Echo on 05/2022 showed an EF of <20% with global hypokinesis and paradoxical septal motion consistent with a LBBB and moderate mitral regurgitation. Her exacerbation was triggered by medication non-compliance. She could not provide reasoning as to why she did not take her meds. Reports improvement in her leg edema. No PND, orthopnea, SOB, or rales on exam. Edema pitting 3+ today. She reports no issues obtaining her HF medications and has been adhering to treatment per her report. She does state that she went to pick up Jardiance and her co-pay was $30 so she did not pick up her medication. Given 14 tablets of Jardiance (Lot [id number] Exp Jun 2026) and advised patient to call her insurance company to check what SGLT-2 inhibitors they would cover. Pt reports understanding and will call this afternoon. She reports improvement in her leg edema.  -Continue taking Lasix 40mg  BID -Continue taking Coreg 25mg  BID  -Continue taking Entresto 24-26mg  BID  -Continue taking Jardiance 25mg  daily  -Follow up with Cardiology  -Continue restricting fluid intake  -Elevate legs above heart level  -Compression stockings (DME order placed)

## 2022-09-04 NOTE — Assessment & Plan Note (Signed)
She is currently on Jardiance 25mg  and semaglutide 0.5. Hgb A1c at last visit was 7.9%. Pt is to continue taking this regimen and contact us should she have trouble obtaining Jardiance. Can consider sending medication to [facility] if insurance still requires the co-pay.  -Referred to Optho today for an eye exam (due)

## 2022-09-04 NOTE — Progress Notes (Signed)
Established Patient Office Visit  Subjective   Patient ID: [name], female    DOB: 1950/07/01  Age: 72 y.o. MRN: [id number]  Chief Complaint  Patient presents with   Follow-up   Medication Refill    HPI  [name] is a 72 y.o. who comes in to follow up on her Diabetes and HF. For more details please see the A&P portion of this note.   Past Medical History:  Diagnosis Date   Acute exacerbation of CHF (congestive heart failure) (HCC) 09/12/2017   Acute gallstone pancreatitis    Acute gallstone pancreatitis    Acute on chronic systolic heart failure (HCC)    Bilateral pleural effusion 02/22/2014   Cardiomyopathy, ischemic, EF 25-30% 03/23/2014   03/01/2014- Date of procedure- CABG- X5- Severe 3-vessel coronary artery disease with severe LV dysfunction.  Ejection fraction 25-30%.  SURGICAL PROCEDURE:  Coronary artery bypass grafting x 5. SURGEON:  Sheliah Plane, MD.      Cardiomyopathy, ischemic, EF 25-30% 03/23/2014   03/01/2014- Date of procedure- CABG- X5- Severe 3-vessel coronary artery disease with severe LV dysfunction.  Ejection fraction 25-30%.  SURGICAL PROCEDURE:  Coronary artery bypass grafting x 5. SURGEON:  Sheliah Plane, MD.      Chronic systolic heart failure [facility])    Coronary artery disease involving native coronary artery of native heart without angina pectoris    Diabetes mellitus 1998   Dx in 1998. Microalbuminuria, never on insulin.   Diabetes mellitus type 2, controlled, without complications (HCC) 12/05/2005   Last A1C 6.7 (03/21/17)    On Diet Control Only Last Foot Exam: 03/21/17    Last eye exam: to be scheduled early 2019    Dyspnea    Essential hypertension 12/05/2005   Lisinopril 40mg  Daily, Coreg 25mg  BID.    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   Viral URI with cough 03/21/2017   Past Surgical History:  Procedure Laterality Date   CHOLECYSTECTOMY N/A 08/06/2016   Procedure: LAPAROSCOPIC CHOLECYSTECTOMY WITH INTRAOPERATIVE CHOLANGIOGRAM;  Surgeon: Violeta Gelinas, MD;  [facility];  Service: General;  Laterality: N/A;   COLONOSCOPY WITH PROPOFOL N/A 05/22/2022   Procedure: COLONOSCOPY WITH PROPOFOL;  Surgeon: Vida Rigger, MD;  Location: [facility] ENDOSCOPY;  Service: Gastroenterology;  Laterality: N/A;   CORONARY ARTERY BYPASS GRAFT N/A 03/01/2014   Procedure: CORONARY ARTERY BYPASS GRAFTING (CABG) x five, using left internal mammary artery and right leg greater saphenous vein harvested endoscopically;  Surgeon: Delight Ovens, MD;  Location: [facility] OR;  Service: Open Heart Surgery;  Laterality: N/A;   INTRAOPERATIVE TRANSESOPHAGEAL ECHOCARDIOGRAM N/A 03/01/2014   Procedure: INTRAOPERATIVE TRANSESOPHAGEAL ECHOCARDIOGRAM;  Surgeon: Delight Ovens, MD;  Location: [facility] OR;  Service: Open Heart Surgery;  Laterality: N/A;   LEFT AND RIGHT HEART CATHETERIZATION WITH CORONARY ANGIOGRAM N/A 02/26/2014   Procedure: LEFT AND RIGHT HEART CATHETERIZATION WITH CORONARY ANGIOGRAM;  Surgeon: Lesleigh Noe, MD;  Location: [facility] CATH LAB;  Service: Cardiovascular;  Laterality: N/A;   POLYPECTOMY  05/22/2022   Procedure: POLYPECTOMY;  Surgeon: Vida Rigger, MD;  Location: [facility] ENDOSCOPY;  Service: Gastroenterology;;   TOTAL ABDOMINAL HYSTERECTOMY W/ BILATERAL SALPINGOOPHORECTOMY  1996    Current Outpatient Medications:    aspirin EC 81 MG tablet, Take 1 tablet (81 mg total) by mouth daily. Swallow whole., Disp: 90 tablet, Rfl: 3   atorvastatin (LIPITOR) 80 MG tablet, TAKE 1 TABLET BY MOUTH DAILY, Disp: 90 tablet, Rfl: 3   calcium carbonate (OSCAL) 1500 (600 Ca) MG TABS tablet, Take 1,500 mg by mouth daily., Disp: , Rfl:    carvedilol (COREG) 25 MG tablet, Take 1  tablet (25 mg total) by mouth 2 (two) times daily with a meal., Disp: 60 tablet, Rfl: 0   empagliflozin (JARDIANCE) 25 MG TABS tablet, TAKE 1 TABLET BY MOUTH DAILY, Disp: 90 tablet, Rfl: 2   ezetimibe (ZETIA) 10 MG tablet, TAKE ONE TABLET BY MOUTH DAILY, Disp: 90 tablet, Rfl: 3   furosemide (LASIX) 40 MG tablet, Take 1 tablet (40 mg total) by mouth 2 (two) times daily., Disp: 60 tablet, Rfl: 0   Insulin Pen Needle (PEN NEEDLES) 32G X 4 MM MISC, 1 each by Does not apply route as directed., Disp: 100 each, Rfl: 1   sacubitril-valsartan (ENTRESTO) 24-26 MG, Take 1 tablet by mouth 2 (two) times daily., Disp: 180 tablet, Rfl: 3   Semaglutide,0.25 or 0.5MG /DOS, 2 MG/3ML SOPN, Inject 0.5 mg into the skin once a week., Disp: 3 mL, Rfl: 2   Review of Systems  Constitutional:  Negative for chills and fever.  Respiratory:  Negative for cough.   Cardiovascular:  Positive for leg swelling. Negative for chest pain, orthopnea and PND.  Gastrointestinal:  Negative for nausea.      Objective:     BP (!) 103/45 (BP Location: Left Arm, Patient Position: Sitting, Cuff Size: Normal)   Pulse 80   Temp 97.9 F (36.6 C) (Oral)   Ht 5\' 2"  (1.575 m)   Wt 173 lb 3.2 oz (78.6 kg)   SpO2 100%   BMI 31.68 kg/m  BP Readings from Last 3 Encounters:  09/04/22 (!) 103/45  09/04/22 (!) 103/45  08/13/22 126/84    Physical Exam Constitutional:      General: She is not in acute distress. Cardiovascular:     Rate and Rhythm: Normal rate and regular rhythm.     Heart sounds: S1 normal and S2 normal. No murmur heard.    No friction rub. No gallop. No S3 or S4 sounds.  Pulmonary:     Breath sounds: No decreased breath sounds, wheezing, rhonchi or rales.  Musculoskeletal:     Right lower leg: 3+ Edema present.     Left lower leg: 3+ Edema present.  Neurological:     Mental Status: She is alert.    No results found for any visits on 09/04/22.  Last CBC Lab Results  Component Value Date   WBC 5.8 07/27/2022    HGB 11.1 (L) 07/27/2022   HCT 33.0 (L) 07/27/2022   MCV 88.0 07/27/2022   MCH 29.6 07/27/2022   RDW 16.5 (H) 07/27/2022   PLT 189 07/27/2022   Last metabolic panel Lab Results  Component Value Date   GLUCOSE 243 (H) 08/07/2022   NA 140 08/07/2022   K 4.7 08/07/2022   CL 98 08/07/2022   CO2 27 08/07/2022   BUN 36 (H) 08/07/2022   CREATININE 2.12 (H) 08/07/2022   EGFR 24 (L) 08/07/2022   CALCIUM 9.2 08/07/2022   PROT 6.2 (L) 07/30/2022   ALBUMIN 3.0 (L) 07/30/2022   LABGLOB 3.6 04/09/2022   AGRATIO 1.1 (L) 04/09/2022   BILITOT 0.9 07/30/2022  ALKPHOS 92 07/30/2022   AST 28 07/30/2022   ALT 41 07/30/2022   ANIONGAP 9 07/30/2022   Last hemoglobin A1c Lab Results  Component Value Date   HGBA1C 7.9 (H) 07/27/2022      The 10-year ASCVD risk score (Arnett DK, et al., 2019) is: 14.5%    Assessment & Plan:   Problem List Items Addressed This Visit       Cardiovascular and Mediastinum   Heart failure with reduced ejection fraction (HCC)    Recently hospitalize for acute on chronic HF. Echo on 05/2022 showed an EF of <20% with global hypokinesis and paradoxical septal motion consistent with a LBBB and moderate mitral regurgitation. Her exacerbation was triggered by medication non-compliance. She could not provide reasoning as to why she did not take her meds. Reports improvement in her leg edema. No PND, orthopnea, SOB, or rales on exam. Edema pitting 3+ today. She reports no issues obtaining her HF medications and has been adhering to treatment per her report. She does state that she went to pick up Jardiance and her co-pay was $30 so she did not pick up her medication. Given 14 tablets of Jardiance (Lot [id number] Exp Jun 2026) and advised patient to call her insurance company to check what SGLT-2 inhibitors they would cover. Pt reports understanding and will call this afternoon. She reports improvement in her leg edema.  -Continue taking Lasix 40mg  BID -Continue taking Coreg  25mg  BID  -Continue taking Entresto 24-26mg  BID  -Continue taking Jardiance 25mg  daily  -Follow up with Cardiology  -Continue restricting fluid intake  -Elevate legs above heart level  -Compression stockings (DME order placed)          Endocrine   Type 2 diabetes mellitus with stage 4 chronic kidney disease, without long-term current use of insulin (HCC) - Primary    She is currently on Jardiance 25mg  and semaglutide 0.5. Hgb A1c at last visit was 7.9%. Pt is to continue taking this regimen and contact us should she have trouble obtaining Jardiance. Can consider sending medication to [facility] if insurance still requires the co-pay.  -Referred to Optho today for an eye exam (due)      Relevant Orders   Ambulatory referral to Ophthalmology     Genitourinary   CKD (chronic kidney disease) stage 4, GFR 15-29 ml/min (HCC) (Chronic)    Nephro following. Denies any difficulty or problems with urination. Currently on Entresto, Jardiance, Zetia and Laxis. Last BMP showed a Cr was improving but not at baseline (2.12 decreased from 2.3). Plan : -Monitor symptoms of worsening kidney function such as lack of appetite, difficulty urinating, not producing urine. - Avoid all nephrotoxic agents if possible - Follow up in 4 weeks      Other Visit Diagnoses     Heart failure, unspecified HF chronicity, unspecified heart failure type (HCC)       Relevant Orders   For home use only DME Other see comment       Return in about 4 weeks (around 10/02/2022).   Pt. Seen with Dr. Desmond Dike, MD

## 2022-09-04 NOTE — Assessment & Plan Note (Signed)
Nephro following. Denies any difficulty or problems with urination. Currently on Entresto, Jardiance, Zetia and Laxis. Last BMP showed a Cr was improving but not at baseline (2.12 decreased from 2.3). Plan : -Monitor symptoms of worsening kidney function such as lack of appetite, difficulty urinating, not producing urine. - Avoid all nephrotoxic agents if possible - Follow up in 4 weeks

## 2022-09-04 NOTE — Patient Instructions (Signed)
Thank you, [name] for allowing [place] to provide your care today. Today we discussed that we should keep taking your medications for your heart failure and diabetes.   Referrals ordered today:   Referral Orders         Ambulatory referral to Ophthalmology      Remember:   Call  your insurance company (number at the back of your card) and ask them what SGLT-2 inhibitors are covered by your insurance. Let them know about the $30 copay.    Should you have any questions or concerns please call the internal medicine clinic at [phone].     [name], MD [facility]

## 2022-09-04 NOTE — Progress Notes (Signed)
Subjective:   [name] is a 71 y.o. female who presents for an Initial Medicare Annual Wellness Visit.  Visit Complete: In person  Patient Medicare AWV questionnaire was completed by the patient on 09/04/2022; I have confirmed that all information answered by patient is correct and no changes since this date.  Review of Systems    Defer to PCP       Objective:    Today's Vitals   09/04/22 1511  BP: (!) 103/45  Pulse: 80  Temp: 97.7 F (36.5 C)  TempSrc: Oral  SpO2: 100%  Weight: 173 lb 3.2 oz (78.6 kg)  Height: 5\' 2"  (1.575 m)   Body mass index is 31.68 kg/m.     09/04/2022    3:13 PM 09/04/2022    1:57 PM 08/07/2022   10:29 AM 05/22/2022    6:36 AM 01/10/2022    3:10 PM 08/09/2021    2:13 PM 06/29/2021    2:57 PM  Advanced Directives  Does Patient Have a Medical Advance Directive? Yes Yes Yes Yes No Yes No  Type of Estate agent of [place];Living will Healthcare Power of [place];Living will Healthcare Power of [place];Living will   Healthcare Power of [place];Living will   Does patient want to make changes to medical advance directive? No - Patient declined  No - Patient declined   No - Patient declined   Copy of Healthcare Power of Attorney in Chart? Yes - validated most recent copy scanned in chart (See row information) Yes - validated most recent copy scanned in chart (See row information) Yes - validated most recent copy scanned in chart (See row information)   Yes - validated most recent copy scanned in chart (See row information)   Would patient like information on creating a medical advance directive?     No - Patient declined No - Patient declined No - Patient declined    Current Medications (verified) Outpatient Encounter Medications as of 09/04/2022  Medication Sig   aspirin EC 81 MG tablet Take 1 tablet (81 mg total) by mouth daily. Swallow whole.   atorvastatin (LIPITOR) 80 MG tablet TAKE 1 TABLET BY MOUTH DAILY   calcium  carbonate (OSCAL) 1500 (600 Ca) MG TABS tablet Take 1,500 mg by mouth daily.   carvedilol (COREG) 25 MG tablet Take 1 tablet (25 mg total) by mouth 2 (two) times daily with a meal.   empagliflozin (JARDIANCE) 25 MG TABS tablet TAKE 1 TABLET BY MOUTH DAILY   ezetimibe (ZETIA) 10 MG tablet TAKE ONE TABLET BY MOUTH DAILY   furosemide (LASIX) 40 MG tablet Take 1 tablet (40 mg total) by mouth 2 (two) times daily.   Insulin Pen Needle (PEN NEEDLES) 32G X 4 MM MISC 1 each by Does not apply route as directed.   sacubitril-valsartan (ENTRESTO) 24-26 MG Take 1 tablet by mouth 2 (two) times daily.   Semaglutide,0.25 or 0.5MG /DOS, 2 MG/3ML SOPN Inject 0.5 mg into the skin once a week.   No facility-administered encounter medications on file as of 09/04/2022.    Allergies (verified) Patient has no known allergies.   History: Past Medical History:  Diagnosis Date   Acute exacerbation of CHF (congestive heart failure) (HCC) 09/12/2017   Acute gallstone pancreatitis    Acute gallstone pancreatitis    Acute on chronic systolic heart failure (HCC)    Bilateral pleural effusion 02/22/2014   Cardiomyopathy, ischemic, EF 25-30% 03/23/2014   03/01/2014- Date of procedure- CABG- X5- Severe 3-vessel coronary artery  disease with severe LV dysfunction.  Ejection fraction 25-30%.  SURGICAL PROCEDURE:  Coronary artery bypass grafting x 5. SURGEON:  Sheliah Plane, MD.      Cardiomyopathy, ischemic, EF 25-30% 03/23/2014   03/01/2014- Date of procedure- CABG- X5- Severe 3-vessel coronary artery disease with severe LV dysfunction.  Ejection fraction 25-30%.  SURGICAL PROCEDURE:  Coronary artery bypass grafting x 5. SURGEON:  Sheliah Plane, MD.      Chronic systolic heart failure [facility])    Coronary artery disease involving native coronary artery of native heart without angina pectoris    Diabetes mellitus 1998   Dx in 1998. Microalbuminuria, never on insulin.   Diabetes mellitus type 2, controlled, without complications  (HCC) 12/05/2005   Last A1C 6.7 (03/21/17)    On Diet Control Only Last Foot Exam: 03/21/17    Last eye exam: to be scheduled early 2019    Dyspnea    Essential hypertension 12/05/2005   Lisinopril 40mg  Daily, Coreg 25mg  BID.    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   Viral URI with cough 03/21/2017   Past Surgical History:  Procedure Laterality Date   CHOLECYSTECTOMY N/A 08/06/2016   Procedure: LAPAROSCOPIC CHOLECYSTECTOMY WITH INTRAOPERATIVE CHOLANGIOGRAM;  Surgeon: Violeta Gelinas, MD;  Location[facility];  Service: General;  Laterality: N/A;   COLONOSCOPY WITH PROPOFOL N/A 05/22/2022   Procedure: COLONOSCOPY WITH PROPOFOL;  Surgeon: Vida Rigger, MD;  Location: [facility] ENDOSCOPY;  Service: Gastroenterology;  Laterality: N/A;   CORONARY ARTERY BYPASS GRAFT N/A 03/01/2014   Procedure: CORONARY ARTERY BYPASS GRAFTING (CABG) x five, using left internal mammary artery and right leg greater saphenous vein harvested endoscopically;  Surgeon: Delight Ovens, MD;  Location: [facility] OR;  Service: Open Heart Surgery;  Laterality: N/A;   INTRAOPERATIVE TRANSESOPHAGEAL ECHOCARDIOGRAM N/A 03/01/2014   Procedure: INTRAOPERATIVE TRANSESOPHAGEAL ECHOCARDIOGRAM;  Surgeon: Delight Ovens, MD;  Location: [facility] OR;  Service: Open Heart Surgery;  Laterality: N/A;   LEFT AND RIGHT HEART CATHETERIZATION WITH CORONARY ANGIOGRAM N/A 02/26/2014   Procedure: LEFT AND RIGHT HEART CATHETERIZATION WITH CORONARY ANGIOGRAM;  Surgeon: Lesleigh Noe, MD;  Location: [facility] CATH LAB;  Service: Cardiovascular;  Laterality: N/A;   POLYPECTOMY  05/22/2022   Procedure: POLYPECTOMY;  Surgeon: Vida Rigger, MD;  Location: [facility] ENDOSCOPY;  Service:  Gastroenterology;;   TOTAL ABDOMINAL HYSTERECTOMY W/ BILATERAL SALPINGOOPHORECTOMY  1996   Family History  Problem Relation Age of Onset   Diabetes Mother        Mother died of MI at age 31   Heart failure Mother    Kidney failure Mother    Heart failure Sister        Died   Heart failure Brother        Died   Diabetes Brother        Died   Breast cancer Neg Hx    Social History   Socioeconomic History   Marital status: Widowed    Spouse name: Not on file   Number of children: 2   Years of education: Not on file   Highest education level: 9th grade  Occupational History   Occupation: Armed forces training and education officer  Occupation: Retired  Tobacco Use   Smoking status: Never   Smokeless tobacco: Never  Vaping Use   Vaping status: Never Used  Substance and Sexual Activity   Alcohol use: No    Alcohol/week: 0.0 standard drinks of alcohol   Drug use: No   Sexual activity: Not on file  Other Topics Concern   Not on file  Social History Narrative   Married x 42 yrs.  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   Never smoked. Doesn't drink or use drugs.   Does Patient Exercise:  yes - sometimes      To get diabetes supplies for Antonietta Barcelona RD, CDE, April 20,2011   Social Determinants of Health   Financial Resource Strain: Low Risk  (09/04/2022)   Overall Financial Resource Strain (CARDIA)    Difficulty of Paying Living Expenses: Not hard at all  Food Insecurity: Food Insecurity Present (09/04/2022)   Hunger Vital Sign    Worried About Running Out of Food in the Last Year: Sometimes true    Ran Out of Food in the Last Year: Sometimes true  Transportation Needs: Unmet Transportation Needs (09/04/2022)   PRAPARE - Administrator, Civil Service (Medical): Yes    Lack of Transportation (Non-Medical): No  Physical Activity: Insufficiently Active (09/04/2022)   Exercise Vital Sign    Days of Exercise per Week: 2 days     Minutes of Exercise per Session: 30 min  Stress: No Stress Concern Present (09/04/2022)   [employer] of Occupational Health - Occupational Stress Questionnaire    Feeling of Stress : Only a little  Social Connections: Socially Isolated (09/04/2022)   Social Connection and Isolation Panel [NHANES]    Frequency of Communication with Friends and Family: Once a week    Frequency of Social Gatherings with Friends and Family: Once a week    Attends Religious Services: Never    Database administrator or Organizations: No    Attends Banker Meetings: Never    Marital Status: Widowed    Tobacco Counseling Counseling given: Not Answered   Clinical Intake:  Pre-visit preparation completed: Yes  Pain : No/denies pain     Nutritional Risks: None Diabetes: Yes CBG done?: No Did pt. bring in CBG monitor from home?: No  How often do you need to have someone help you when you read instructions, pamphlets, or other written materials from your doctor or pharmacy?: 1 - Never What is the last grade level you completed in school?: 9th grade  Interpreter Needed?: No  Information entered by ::  ,cma   Activities of Daily Living    09/04/2022    3:13 PM 09/04/2022    3:12 PM  In your present state of health, do you have any difficulty performing the following activities:  Hearing? 0 0  Vision? 0 0  Difficulty concentrating or making decisions? 0 0  Walking or climbing stairs? 0 0  Dressing or bathing? 0 0  Doing errands, shopping? 0 0    Patient Care Team: Chauncey Mann, DO as PCP - General Lewayne Bunting, MD as PCP - Cardiology (Cardiology) Billie Ruddy, OD as Referring Physician (Optometry) Stephannie Li, MD as Consulting Physician (Ophthalmology)  Indicate any recent Medical Services you may have received from other than [facility] providers in the past year (date may be approximate).     Assessment:   This is a routine wellness examination for  Ting.  Hearing/Vision screen No results found.  Dietary issues and exercise activities discussed:     Goals Addressed   None   Depression Screen    09/04/2022    3:13 PM 09/04/2022    1:56 PM 01/10/2022    3:10 PM 06/29/2021    2:57 PM 09/15/2020    1:21 PM 11/18/2019    4:13 PM 09/24/2019    1:37 PM  PHQ 2/9 Scores  PHQ - 2 Score 0 0 0 0 0 0 0  PHQ- 9 Score   0 1 0  0    Fall Risk    09/04/2022    3:13 PM 09/04/2022    1:56 PM 01/10/2022    3:10 PM 08/09/2021    2:12 PM 06/29/2021    2:57 PM  Fall Risk   Falls in the past year? 1 1 0 0 0  Number falls in past yr: 0 0  0 0  Injury with Fall? 0 0  0 0  Risk for fall due to : No Fall Risks No Fall Risks No Fall Risks No Fall Risks   Follow up Falls evaluation completed;Falls prevention discussed Falls evaluation completed;Falls prevention discussed Falls evaluation completed Falls evaluation completed Falls evaluation completed    MEDICARE RISK AT HOME:   TIMED UP AND GO:  Was the test performed? No    Cognitive Function:        09/04/2022    3:13 PM  6CIT Screen  What Year? 0 points  What month? 0 points  What time? 0 points  Count back from 20 0 points  Months in reverse 0 points  Repeat phrase 0 points  Total Score 0 points    Immunizations Immunization History  Administered Date(s) Administered   Fluad Quad(high Dose 65+) 01/10/2022   Influenza,inj,Quad PF,6+ Mos 12/12/2012, 02/23/2014, 10/12/2014, 11/22/2016, 09/26/2017, 11/24/2018   Pneumococcal Conjugate-13 12/31/2017   Pneumococcal Polysaccharide-23 08/17/2010, 08/08/2016   Tdap 08/17/2010, 09/15/2020    TDAP status: Up to date  Flu Vaccine status: Due, Education has been provided regarding the importance of this vaccine. Advised may receive this vaccine at local pharmacy or Health Dept. Aware to provide a copy of the vaccination record if obtained from local pharmacy or Health Dept. Verbalized acceptance and understanding.  Pneumococcal vaccine  status: Up to date  Covid-19 vaccine status: Information provided on how to obtain vaccines.   Qualifies for Shingles Vaccine? No   Zostavax completed No   Shingrix Completed?: No.    Education has been provided regarding the importance of this vaccine. Patient has been advised to call insurance company to determine out of pocket expense if they have not yet received this vaccine. Advised may also receive vaccine at local pharmacy or Health Dept. Verbalized acceptance and understanding.  Screening Tests Health Maintenance  Topic Date Due   COVID-19 Vaccine (1) Never done   Zoster Vaccines- Shingrix (1 of 2) Never done   OPHTHALMOLOGY EXAM  05/30/2022   INFLUENZA VACCINE  08/30/2022   HEMOGLOBIN A1C  10/27/2022   Diabetic kidney evaluation - Urine ACR  01/11/2023   FOOT EXAM  01/11/2023   LIPID PANEL  04/09/2023   Diabetic kidney evaluation - eGFR measurement  08/07/2023   Medicare Annual Wellness (AWV)  09/04/2023   MAMMOGRAM  01/19/2024   Colonoscopy  05/22/2027   DTaP/Tdap/Td (3 - Td or Tdap) 09/16/2030   Pneumonia Vaccine 83+ Years old  Completed   DEXA SCAN  Completed   Hepatitis C Screening  Completed   HPV VACCINES  Aged Out    Health Maintenance  Health Maintenance Due  Topic Date Due   COVID-19 Vaccine (1) Never done   Zoster Vaccines- Shingrix (1 of 2) Never done   OPHTHALMOLOGY EXAM  05/30/2022   INFLUENZA VACCINE  08/30/2022    Colorectal cancer screening: Type of screening: Colonoscopy. Completed 05/22/2022. Repeat every 5 years  Mammogram status: Completed 01/18/2022. Repeat every year:2  Bone Density status: Completed 12/05/2017. Results reflect: Bone density results: OSTEOPENIA. Repeat every 0 years.  Lung Cancer Screening: (Low Dose CT Chest recommended if Age 55-80 years, 20 pack-year currently smoking OR have quit w/in 15years.) does not qualify.   Lung Cancer Screening Referral: N/A  Additional Screening:  Hepatitis C Screening: does not qualify;  Completed 01/31/2014  Vision Screening: Recommended annual ophthalmology exams for early detection of glaucoma and other disorders of the eye. Is the patient up to date with their annual eye exam?  No  Who is the provider or what is the name of the office in which the patient attends annual eye exams? N/A If pt is not established with a provider, would they like to be referred to a provider to establish care? Yes .   Dental Screening: Recommended annual dental exams for proper oral hygiene  Diabetic Foot Exam: Diabetic Foot Exam: Completed 01/28/2022  Community Resource Referral / Chronic Care Management: CRR required this visit?  No   CCM required this visit?  No     Plan:     I have personally reviewed and noted the following in the patient's chart:   Medical and social history Use of alcohol, tobacco or illicit drugs  Current medications and supplements including opioid prescriptions. Patient is not currently taking opioid prescriptions. Functional ability and status Nutritional status Physical activity Advanced directives List of other physicians Hospitalizations, surgeries, and ER visits in previous 12 months Vitals Screenings to include cognitive, depression, and falls Referrals and appointments  In addition, I have reviewed and discussed with patient certain preventive protocols, quality metrics, and best practice recommendations. A written personalized care plan for preventive services as well as general preventive health recommendations were provided to patient.     Cala [place], CMA   09/04/2022   After Visit Summary: (Mail) Due to this being a telephonic visit, the after visit summary with patients personalized plan was offered to patient via mail   Nurse Notes: Face-To-Face Visit  Ms. Massie , Thank you for taking time to come for your Medicare Wellness Visit. I appreciate your ongoing commitment to your health goals. Please review the following plan we  discussed and let me know if I can assist you in the future.   These are the goals we discussed:  Goals      Blood Pressure < 140/90     HEMOGLOBIN A1C < 7.0     LDL CALC < 100        This is a list of the screening recommended for you and due dates:  Health Maintenance  Topic Date Due   COVID-19 Vaccine (1) Never done   Zoster (Shingles) Vaccine (1 of 2) Never done   Eye exam for diabetics  05/30/2022   Flu Shot  08/30/2022   Hemoglobin A1C  10/27/2022   Yearly kidney health urinalysis for diabetes  01/11/2023   Complete foot exam   01/11/2023   Lipid (cholesterol) test  04/09/2023   Yearly kidney function blood test for diabetes  08/07/2023   Medicare  Annual Wellness Visit  09/04/2023   Mammogram  01/19/2024   Colon Cancer Screening  05/22/2027   DTaP/Tdap/Td vaccine (3 - Td or Tdap) 09/16/2030   Pneumonia Vaccine  Completed   DEXA scan (bone density measurement)  Completed   Hepatitis C Screening  Completed   HPV Vaccine  Aged Out

## 2022-09-10 NOTE — Addendum Note (Signed)
Addended by: Burnell Blanks on: 09/10/2022 11:52 AM   Modules accepted: Level of Service

## 2022-09-10 NOTE — Progress Notes (Signed)
Internal Medicine Clinic Attending  I was physically present during the key portions of the resident provided service and participated in the medical decision making of patient's management care. I reviewed pertinent patient test results.  The assessment, diagnosis, and plan were formulated together and I agree with the documentation in the resident's note.  Guilloud, Carolyn, MD  

## 2022-09-24 NOTE — Progress Notes (Signed)
Internal Medicine Clinic Attending  Case, documentation, and findings reviewed. I agree with the assessment, diagnosis, and plan of care as outlined in the AWV note.     

## 2022-10-02 ENCOUNTER — Encounter: Payer: 59 | Admitting: Student

## 2022-10-12 NOTE — Progress Notes (Deleted)
HPI: FU CAD; previously admitted with CHF; echo 1/16 showed EF 25-30, mild to moderate MR, mild LAE/RAE/RVE; moderately reduced RV function; small pericardial effusion. Carotid dopplers 1/16 showed 1-39 bilateral stenosis. Cath revealed 3 vessel CAD and EF 30-35. Had CABG with LIMA to LAD, seq svg to first and second diagonal, svg to OM, svg to PDA. Patient previously referred to electrophysiology for consideration of ICD but patient declined. Last echocardiogram May 2024 showed ejection fraction less than 20%, grade 1 diastolic dysfunction, moderate left atrial enlargement, moderate mitral regurgitation.  Since last seen,    Current Outpatient Medications  Medication Sig Dispense Refill   aspirin EC 81 MG tablet Take 1 tablet (81 mg total) by mouth daily. Swallow whole. 90 tablet 3   atorvastatin (LIPITOR) 80 MG tablet TAKE 1 TABLET BY MOUTH DAILY 90 tablet 3   calcium carbonate (OSCAL) 1500 (600 Ca) MG TABS tablet Take 1,500 mg by mouth daily.     carvedilol (COREG) 25 MG tablet Take 1 tablet (25 mg total) by mouth 2 (two) times daily with a meal. 60 tablet 0   empagliflozin (JARDIANCE) 25 MG TABS tablet TAKE 1 TABLET BY MOUTH DAILY 90 tablet 2   ezetimibe (ZETIA) 10 MG tablet TAKE ONE TABLET BY MOUTH DAILY 90 tablet 3   furosemide (LASIX) 40 MG tablet Take 1 tablet (40 mg total) by mouth 2 (two) times daily. 60 tablet 0   Insulin Pen Needle (PEN NEEDLES) 32G X 4 MM MISC 1 each by Does not apply route as directed. 100 each 1   sacubitril-valsartan (ENTRESTO) 24-26 MG Take 1 tablet by mouth 2 (two) times daily. 180 tablet 3   Semaglutide,0.25 or 0.5MG /DOS, 2 MG/3ML SOPN Inject 0.5 mg into the skin once a week. 3 mL 2   No current facility-administered medications for this visit.     Past Medical History:  Diagnosis Date   Acute exacerbation of CHF (congestive heart failure) (HCC) 09/12/2017   Acute gallstone pancreatitis    Acute gallstone pancreatitis    Acute on chronic systolic  heart failure (HCC)    Bilateral pleural effusion 02/22/2014   Cardiomyopathy, ischemic, EF 25-30% 03/23/2014   03/01/2014- Date of procedure- CABG- X5- Severe 3-vessel coronary artery disease with severe LV dysfunction.  Ejection fraction 25-30%.  SURGICAL PROCEDURE:  Coronary artery bypass grafting x 5. SURGEON:  Sheliah Plane, MD.      Cardiomyopathy, ischemic, EF 25-30% 03/23/2014   03/01/2014- Date of procedure- CABG- X5- Severe 3-vessel coronary artery disease with severe LV dysfunction.  Ejection fraction 25-30%.  SURGICAL PROCEDURE:  Coronary artery bypass grafting x 5. SURGEON:  Sheliah Plane, MD.      Chronic systolic heart failure [facility])    Coronary artery disease involving native coronary artery of native heart without angina pectoris    Diabetes mellitus 1998   Dx in 1998. Microalbuminuria, never on insulin.   Diabetes mellitus type 2, controlled, without complications (HCC) 12/05/2005   Last A1C 6.7 (03/21/17)    On Diet Control Only Last Foot Exam: 03/21/17    Last eye exam: to be scheduled early 2019    Dyspnea    Essential hypertension 12/05/2005   Lisinopril 40mg  Daily, Coreg 25mg  BID.    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   Viral URI with cough 03/21/2017    Past Surgical History:  Procedure Laterality Date   CHOLECYSTECTOMY N/A 08/06/2016   Procedure: LAPAROSCOPIC CHOLECYSTECTOMY WITH INTRAOPERATIVE CHOLANGIOGRAM;  Surgeon: Violeta Gelinas, MD;  Location[facility] OR;  Service: General;  Laterality: N/A;   COLONOSCOPY WITH PROPOFOL N/A 05/22/2022   Procedure: COLONOSCOPY WITH PROPOFOL;  Surgeon: Vida Rigger, MD;  Location: [facility] ENDOSCOPY;  Service: Gastroenterology;  Laterality: N/A;    CORONARY ARTERY BYPASS GRAFT N/A 03/01/2014   Procedure: CORONARY ARTERY BYPASS GRAFTING (CABG) x five, using left internal mammary artery and right leg greater saphenous vein harvested endoscopically;  Surgeon: Delight Ovens, MD;  Location: [facility] OR;  Service: Open Heart Surgery;  Laterality: N/A;   INTRAOPERATIVE TRANSESOPHAGEAL ECHOCARDIOGRAM N/A 03/01/2014   Procedure: INTRAOPERATIVE TRANSESOPHAGEAL ECHOCARDIOGRAM;  Surgeon: Delight Ovens, MD;  Location: [facility] OR;  Service: Open Heart Surgery;  Laterality: N/A;   LEFT AND RIGHT HEART CATHETERIZATION WITH CORONARY ANGIOGRAM N/A 02/26/2014   Procedure: LEFT AND RIGHT HEART CATHETERIZATION WITH CORONARY ANGIOGRAM;  Surgeon: Lesleigh Noe, MD;  Location: [facility] CATH LAB;  Service: Cardiovascular;  Laterality: N/A;   POLYPECTOMY  05/22/2022   Procedure: POLYPECTOMY;  Surgeon: Vida Rigger, MD;  Location: [facility] ENDOSCOPY;  Service: Gastroenterology;;   TOTAL ABDOMINAL HYSTERECTOMY W/ BILATERAL SALPINGOOPHORECTOMY  1996    Social History   Socioeconomic History   Marital status: Widowed    Spouse name: Not on file   Number of children: 2   Years of education: Not on file   Highest education level: 9th grade  Occupational History   Occupation: Armed forces training and education officer   Occupation: Retired  Tobacco Use   Smoking status: Never   Smokeless tobacco: Never  Vaping Use   Vaping status: Never Used  Substance and Sexual Activity   Alcohol use: No    Alcohol/week: 0.0 standard drinks of alcohol   Drug use: No   Sexual activity: Not on file  Other Topics Concern   Not on file  Social History Narrative   Married x 42 yrs.  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   Never smoked. Doesn't drink or use drugs.   Does Patient Exercise:  yes - sometimes      To get diabetes supplies for Antonietta Barcelona RD, CDE, April 20,2011   Social Determinants of Health   Financial Resource Strain: Low Risk   (09/04/2022)   Overall Financial Resource Strain (CARDIA)    Difficulty of Paying Living Expenses: Not hard at all  Food Insecurity: Food Insecurity Present (09/04/2022)   Hunger Vital Sign    Worried About Running Out of Food in the Last Year: Sometimes true    Ran Out of Food in the Last Year: Sometimes true  Transportation Needs: Unmet Transportation Needs (09/04/2022)   PRAPARE - Administrator, Civil Service (Medical): Yes    Lack of Transportation (Non-Medical): No  Physical Activity: Insufficiently Active (09/04/2022)   Exercise Vital Sign    Days of Exercise per Week: 2 days    Minutes of Exercise per Session: 30 min  Stress: No Stress Concern Present (09/04/2022)   [employer] of Occupational Health - Occupational Stress Questionnaire    Feeling of Stress : Only a little  Social Connections: Socially Isolated (09/04/2022)   Social Connection and Isolation Panel [  NHANES]    Frequency of Communication with Friends and Family: Once a week    Frequency of Social Gatherings with Friends and Family: Once a week    Attends Religious Services: Never    Database administrator or Organizations: No    Attends Banker Meetings: Never    Marital Status: Widowed  Intimate Partner Violence: Not At Risk (01/10/2022)   Humiliation, Afraid, Rape, and Kick questionnaire    Fear of Current or Ex-Partner: No    Emotionally Abused: No    Physically Abused: No    Sexually Abused: No    Family History  Problem Relation Age of Onset   Diabetes Mother        Mother died of MI at age 72   Heart failure Mother    Kidney failure Mother    Heart failure Sister        Died   Heart failure Brother        Died   Diabetes Brother        Died   Breast cancer Neg Hx     ROS: no fevers or chills, productive cough, hemoptysis, dysphasia, odynophagia, melena, hematochezia, dysuria, hematuria, rash, seizure activity, orthopnea, PND, pedal edema, claudication. Remaining systems are  negative.  Physical Exam: Well-developed well-nourished in no acute distress.  Skin is warm and dry.  HEENT is normal.  Neck is supple.  Chest is clear to auscultation with normal expansion.  Cardiovascular exam is regular rate and rhythm.  Abdominal exam nontender or distended. No masses palpated. Extremities show no edema. neuro grossly intact  ECG- personally reviewed  A/P  1 ischemic cardiomyopathy-continue Entresto and beta-blocker.  She has declined ICD and understands the risk of sudden death.  2 chronic systolic congestive heart failure-continue diuretics at present dose.  Continue Jardiance.  Check potassium and renal function.  3 mitral regurgitation-moderate on most recent echocardiogram.  She will need follow-up studies in the future.  4 hypertension-blood pressure controlled.  Continue present medical regimen.  5 hyperlipidemia-continue statin.  6 coronary artery disease-she is not having chest pain.  Continue aspirin and statin.  7 chronic stage IV kidney disease-followed by nephrology.  Olga Millers, MD

## 2022-10-22 ENCOUNTER — Telehealth: Payer: Self-pay

## 2022-10-22 NOTE — Telephone Encounter (Signed)
Decision:Approved Effective 10/22/22-10/21/2025

## 2022-10-22 NOTE — Telephone Encounter (Signed)
Prior Authorization for patient (Ozempic 0.25 or 0.5MG ) came through on cover my meds was submitted with last office notes and labs awaiting approval or denial.  BJY:[id number]

## 2022-10-23 ENCOUNTER — Ambulatory Visit: Payer: Medicare Other | Admitting: Cardiology

## 2022-11-12 ENCOUNTER — Other Ambulatory Visit: Payer: Self-pay

## 2022-11-12 ENCOUNTER — Encounter (HOSPITAL_COMMUNITY): Payer: Self-pay | Admitting: Emergency Medicine

## 2022-11-12 ENCOUNTER — Inpatient Hospital Stay (HOSPITAL_COMMUNITY)
Admission: EM | Admit: 2022-11-12 | Discharge: 2022-11-30 | DRG: 308 | Disposition: E | Payer: Medicare Other | Attending: Infectious Diseases | Admitting: Infectious Diseases

## 2022-11-12 ENCOUNTER — Emergency Department (HOSPITAL_COMMUNITY): Payer: Medicare Other

## 2022-11-12 ENCOUNTER — Inpatient Hospital Stay (HOSPITAL_COMMUNITY): Payer: Medicare Other

## 2022-11-12 DIAGNOSIS — I469 Cardiac arrest, cause unspecified: Secondary | ICD-10-CM | POA: Diagnosis not present

## 2022-11-12 DIAGNOSIS — J9811 Atelectasis: Secondary | ICD-10-CM | POA: Diagnosis not present

## 2022-11-12 DIAGNOSIS — R918 Other nonspecific abnormal finding of lung field: Secondary | ICD-10-CM | POA: Diagnosis not present

## 2022-11-12 DIAGNOSIS — Z90722 Acquired absence of ovaries, bilateral: Secondary | ICD-10-CM

## 2022-11-12 DIAGNOSIS — R197 Diarrhea, unspecified: Secondary | ICD-10-CM | POA: Diagnosis present

## 2022-11-12 DIAGNOSIS — T426X5A Adverse effect of other antiepileptic and sedative-hypnotic drugs, initial encounter: Secondary | ICD-10-CM | POA: Diagnosis present

## 2022-11-12 DIAGNOSIS — K7200 Acute and subacute hepatic failure without coma: Secondary | ICD-10-CM | POA: Diagnosis present

## 2022-11-12 DIAGNOSIS — I639 Cerebral infarction, unspecified: Secondary | ICD-10-CM | POA: Diagnosis not present

## 2022-11-12 DIAGNOSIS — N179 Acute kidney failure, unspecified: Secondary | ICD-10-CM | POA: Diagnosis not present

## 2022-11-12 DIAGNOSIS — E1165 Type 2 diabetes mellitus with hyperglycemia: Secondary | ICD-10-CM | POA: Diagnosis present

## 2022-11-12 DIAGNOSIS — I447 Left bundle-branch block, unspecified: Secondary | ICD-10-CM | POA: Diagnosis present

## 2022-11-12 DIAGNOSIS — E669 Obesity, unspecified: Secondary | ICD-10-CM | POA: Diagnosis present

## 2022-11-12 DIAGNOSIS — G9340 Encephalopathy, unspecified: Secondary | ICD-10-CM | POA: Diagnosis not present

## 2022-11-12 DIAGNOSIS — R569 Unspecified convulsions: Secondary | ICD-10-CM

## 2022-11-12 DIAGNOSIS — D631 Anemia in chronic kidney disease: Secondary | ICD-10-CM | POA: Diagnosis not present

## 2022-11-12 DIAGNOSIS — K5730 Diverticulosis of large intestine without perforation or abscess without bleeding: Secondary | ICD-10-CM | POA: Diagnosis not present

## 2022-11-12 DIAGNOSIS — I5042 Chronic combined systolic (congestive) and diastolic (congestive) heart failure: Secondary | ICD-10-CM | POA: Diagnosis not present

## 2022-11-12 DIAGNOSIS — N184 Chronic kidney disease, stage 4 (severe): Secondary | ICD-10-CM | POA: Diagnosis present

## 2022-11-12 DIAGNOSIS — Z515 Encounter for palliative care: Secondary | ICD-10-CM

## 2022-11-12 DIAGNOSIS — R451 Restlessness and agitation: Secondary | ICD-10-CM | POA: Diagnosis not present

## 2022-11-12 DIAGNOSIS — J9601 Acute respiratory failure with hypoxia: Secondary | ICD-10-CM | POA: Diagnosis present

## 2022-11-12 DIAGNOSIS — Z841 Family history of disorders of kidney and ureter: Secondary | ICD-10-CM

## 2022-11-12 DIAGNOSIS — Z5982 Transportation insecurity: Secondary | ICD-10-CM

## 2022-11-12 DIAGNOSIS — G931 Anoxic brain damage, not elsewhere classified: Secondary | ICD-10-CM | POA: Diagnosis not present

## 2022-11-12 DIAGNOSIS — Z452 Encounter for adjustment and management of vascular access device: Secondary | ICD-10-CM | POA: Diagnosis not present

## 2022-11-12 DIAGNOSIS — E876 Hypokalemia: Secondary | ICD-10-CM | POA: Diagnosis not present

## 2022-11-12 DIAGNOSIS — N170 Acute kidney failure with tubular necrosis: Secondary | ICD-10-CM | POA: Diagnosis not present

## 2022-11-12 DIAGNOSIS — I4901 Ventricular fibrillation: Secondary | ICD-10-CM | POA: Diagnosis not present

## 2022-11-12 DIAGNOSIS — Z90710 Acquired absence of both cervix and uterus: Secondary | ICD-10-CM

## 2022-11-12 DIAGNOSIS — I462 Cardiac arrest due to underlying cardiac condition: Secondary | ICD-10-CM | POA: Diagnosis not present

## 2022-11-12 DIAGNOSIS — Z4682 Encounter for fitting and adjustment of non-vascular catheter: Secondary | ICD-10-CM | POA: Diagnosis not present

## 2022-11-12 DIAGNOSIS — I499 Cardiac arrhythmia, unspecified: Secondary | ICD-10-CM | POA: Diagnosis not present

## 2022-11-12 DIAGNOSIS — D6959 Other secondary thrombocytopenia: Secondary | ICD-10-CM | POA: Diagnosis present

## 2022-11-12 DIAGNOSIS — Z555 Less than a high school diploma: Secondary | ICD-10-CM

## 2022-11-12 DIAGNOSIS — I4510 Unspecified right bundle-branch block: Secondary | ICD-10-CM | POA: Diagnosis not present

## 2022-11-12 DIAGNOSIS — I255 Ischemic cardiomyopathy: Secondary | ICD-10-CM | POA: Diagnosis not present

## 2022-11-12 DIAGNOSIS — L8990 Pressure ulcer of unspecified site, unspecified stage: Secondary | ICD-10-CM | POA: Insufficient documentation

## 2022-11-12 DIAGNOSIS — J690 Pneumonitis due to inhalation of food and vomit: Secondary | ICD-10-CM | POA: Diagnosis present

## 2022-11-12 DIAGNOSIS — R404 Transient alteration of awareness: Secondary | ICD-10-CM | POA: Diagnosis not present

## 2022-11-12 DIAGNOSIS — E1122 Type 2 diabetes mellitus with diabetic chronic kidney disease: Secondary | ICD-10-CM | POA: Diagnosis present

## 2022-11-12 DIAGNOSIS — Z7189 Other specified counseling: Secondary | ICD-10-CM | POA: Diagnosis not present

## 2022-11-12 DIAGNOSIS — Z781 Physical restraint status: Secondary | ICD-10-CM | POA: Diagnosis not present

## 2022-11-12 DIAGNOSIS — Z833 Family history of diabetes mellitus: Secondary | ICD-10-CM

## 2022-11-12 DIAGNOSIS — J90 Pleural effusion, not elsewhere classified: Secondary | ICD-10-CM | POA: Diagnosis not present

## 2022-11-12 DIAGNOSIS — Z743 Need for continuous supervision: Secondary | ICD-10-CM | POA: Diagnosis not present

## 2022-11-12 DIAGNOSIS — E870 Hyperosmolality and hypernatremia: Secondary | ICD-10-CM | POA: Diagnosis present

## 2022-11-12 DIAGNOSIS — Z7985 Long-term (current) use of injectable non-insulin antidiabetic drugs: Secondary | ICD-10-CM

## 2022-11-12 DIAGNOSIS — Z66 Do not resuscitate: Secondary | ICD-10-CM | POA: Diagnosis not present

## 2022-11-12 DIAGNOSIS — E872 Acidosis, unspecified: Secondary | ICD-10-CM | POA: Diagnosis not present

## 2022-11-12 DIAGNOSIS — I2510 Atherosclerotic heart disease of native coronary artery without angina pectoris: Secondary | ICD-10-CM | POA: Diagnosis present

## 2022-11-12 DIAGNOSIS — G9341 Metabolic encephalopathy: Secondary | ICD-10-CM | POA: Diagnosis not present

## 2022-11-12 DIAGNOSIS — K8689 Other specified diseases of pancreas: Secondary | ICD-10-CM | POA: Diagnosis not present

## 2022-11-12 DIAGNOSIS — R4701 Aphasia: Secondary | ICD-10-CM | POA: Diagnosis not present

## 2022-11-12 DIAGNOSIS — Z8249 Family history of ischemic heart disease and other diseases of the circulatory system: Secondary | ICD-10-CM

## 2022-11-12 DIAGNOSIS — I952 Hypotension due to drugs: Secondary | ICD-10-CM | POA: Diagnosis present

## 2022-11-12 DIAGNOSIS — S0990XA Unspecified injury of head, initial encounter: Secondary | ICD-10-CM | POA: Diagnosis not present

## 2022-11-12 DIAGNOSIS — E785 Hyperlipidemia, unspecified: Secondary | ICD-10-CM | POA: Diagnosis present

## 2022-11-12 DIAGNOSIS — G9389 Other specified disorders of brain: Secondary | ICD-10-CM | POA: Diagnosis not present

## 2022-11-12 DIAGNOSIS — R6889 Other general symptoms and signs: Secondary | ICD-10-CM | POA: Diagnosis not present

## 2022-11-12 DIAGNOSIS — K219 Gastro-esophageal reflux disease without esophagitis: Secondary | ICD-10-CM | POA: Diagnosis present

## 2022-11-12 DIAGNOSIS — I672 Cerebral atherosclerosis: Secondary | ICD-10-CM | POA: Diagnosis not present

## 2022-11-12 DIAGNOSIS — Z91148 Patient's other noncompliance with medication regimen for other reason: Secondary | ICD-10-CM

## 2022-11-12 DIAGNOSIS — E875 Hyperkalemia: Secondary | ICD-10-CM | POA: Diagnosis not present

## 2022-11-12 DIAGNOSIS — Z7982 Long term (current) use of aspirin: Secondary | ICD-10-CM

## 2022-11-12 DIAGNOSIS — Z794 Long term (current) use of insulin: Secondary | ICD-10-CM | POA: Diagnosis not present

## 2022-11-12 DIAGNOSIS — I130 Hypertensive heart and chronic kidney disease with heart failure and stage 1 through stage 4 chronic kidney disease, or unspecified chronic kidney disease: Secondary | ICD-10-CM | POA: Diagnosis not present

## 2022-11-12 DIAGNOSIS — Z789 Other specified health status: Secondary | ICD-10-CM | POA: Diagnosis not present

## 2022-11-12 DIAGNOSIS — D75839 Thrombocytosis, unspecified: Secondary | ICD-10-CM | POA: Diagnosis present

## 2022-11-12 DIAGNOSIS — Z7984 Long term (current) use of oral hypoglycemic drugs: Secondary | ICD-10-CM

## 2022-11-12 DIAGNOSIS — Z951 Presence of aortocoronary bypass graft: Secondary | ICD-10-CM

## 2022-11-12 DIAGNOSIS — Z79899 Other long term (current) drug therapy: Secondary | ICD-10-CM

## 2022-11-12 DIAGNOSIS — Z6833 Body mass index (BMI) 33.0-33.9, adult: Secondary | ICD-10-CM

## 2022-11-12 LAB — CBC
HCT: 29.6 % — ABNORMAL LOW (ref 36.0–46.0)
Hemoglobin: 9.2 g/dL — ABNORMAL LOW (ref 12.0–15.0)
MCH: 29.2 pg (ref 26.0–34.0)
MCHC: 31.1 g/dL (ref 30.0–36.0)
MCV: 94 fL (ref 80.0–100.0)
Platelets: 97 10*3/uL — ABNORMAL LOW (ref 150–400)
RBC: 3.15 MIL/uL — ABNORMAL LOW (ref 3.87–5.11)
RDW: 16.6 % — ABNORMAL HIGH (ref 11.5–15.5)
WBC: 5 10*3/uL (ref 4.0–10.5)
nRBC: 0 % (ref 0.0–0.2)

## 2022-11-12 LAB — URINALYSIS, W/ REFLEX TO CULTURE (INFECTION SUSPECTED)
Bilirubin Urine: NEGATIVE
Glucose, UA: >=500 mg/dL — AB
Ketones, ur: 5 mg/dL — AB
Leukocytes,Ua: NEGATIVE
Nitrite: NEGATIVE
Protein, ur: 100 mg/dL — AB
Specific Gravity, Urine: 1.012 (ref 1.005–1.030)
pH: 5 (ref 5.0–8.0)

## 2022-11-12 LAB — BASIC METABOLIC PANEL
Anion gap: 18 — ABNORMAL HIGH (ref 5–15)
BUN: 32 mg/dL — ABNORMAL HIGH (ref 8–23)
CO2: 17 mmol/L — ABNORMAL LOW (ref 22–32)
Calcium: 8.5 mg/dL — ABNORMAL LOW (ref 8.9–10.3)
Chloride: 106 mmol/L (ref 98–111)
Creatinine, Ser: 2.41 mg/dL — ABNORMAL HIGH (ref 0.44–1.00)
GFR, Estimated: 21 mL/min — ABNORMAL LOW (ref >60)
Glucose, Bld: 136 mg/dL — ABNORMAL HIGH (ref 70–99)
Potassium: 4 mmol/L (ref 3.5–5.1)
Sodium: 141 mmol/L (ref 135–145)

## 2022-11-12 LAB — MRSA NEXT GEN BY PCR, NASAL: MRSA by PCR Next Gen: NOT DETECTED

## 2022-11-12 LAB — I-STAT VENOUS BLOOD GAS, ED
Acid-base deficit: 8 mmol/L — ABNORMAL HIGH (ref 0.0–2.0)
Bicarbonate: 18.1 mmol/L — ABNORMAL LOW (ref 20.0–28.0)
Calcium, Ion: 1.08 mmol/L — ABNORMAL LOW (ref 1.15–1.40)
HCT: 29 % — ABNORMAL LOW (ref 36.0–46.0)
Hemoglobin: 9.9 g/dL — ABNORMAL LOW (ref 12.0–15.0)
O2 Saturation: 63 %
Potassium: 4.2 mmol/L (ref 3.5–5.1)
Sodium: 141 mmol/L (ref 135–145)
TCO2: 19 mmol/L — ABNORMAL LOW (ref 22–32)
pCO2, Ven: 39.9 mm[Hg] — ABNORMAL LOW (ref 44–60)
pH, Ven: 7.265 (ref 7.25–7.43)
pO2, Ven: 37 mm[Hg] (ref 32–45)

## 2022-11-12 LAB — DIFFERENTIAL
Abs Immature Granulocytes: 0.07 10*3/uL (ref 0.00–0.07)
Basophils Absolute: 0 10*3/uL (ref 0.0–0.1)
Basophils Relative: 1 %
Eosinophils Absolute: 0.1 10*3/uL (ref 0.0–0.5)
Eosinophils Relative: 2 %
Immature Granulocytes: 1 %
Lymphocytes Relative: 39 %
Lymphs Abs: 2 10*3/uL (ref 0.7–4.0)
Monocytes Absolute: 0.2 10*3/uL (ref 0.1–1.0)
Monocytes Relative: 4 %
Neutro Abs: 2.7 10*3/uL (ref 1.7–7.7)
Neutrophils Relative %: 53 %

## 2022-11-12 LAB — HEPATIC FUNCTION PANEL
ALT: 443 U/L — ABNORMAL HIGH (ref 0–44)
AST: 535 U/L — ABNORMAL HIGH (ref 15–41)
Albumin: 2.6 g/dL — ABNORMAL LOW (ref 3.5–5.0)
Alkaline Phosphatase: 130 U/L — ABNORMAL HIGH (ref 38–126)
Bilirubin, Direct: 0.4 mg/dL — ABNORMAL HIGH (ref 0.0–0.2)
Indirect Bilirubin: 0.8 mg/dL (ref 0.3–0.9)
Total Bilirubin: 1.2 mg/dL (ref 0.3–1.2)
Total Protein: 5.9 g/dL — ABNORMAL LOW (ref 6.5–8.1)

## 2022-11-12 LAB — TROPONIN I (HIGH SENSITIVITY)
Troponin I (High Sensitivity): 542 ng/L (ref <18)
Troponin I (High Sensitivity): 2846 ng/L (ref <18)

## 2022-11-12 LAB — LIPASE, BLOOD: Lipase: 29 U/L (ref 11–51)

## 2022-11-12 LAB — I-STAT ARTERIAL BLOOD GAS, ED
Acid-base deficit: 4 mmol/L — ABNORMAL HIGH (ref 0.0–2.0)
Bicarbonate: 21.3 mmol/L (ref 20.0–28.0)
Calcium, Ion: 1.13 mmol/L — ABNORMAL LOW (ref 1.15–1.40)
HCT: 28 % — ABNORMAL LOW (ref 36.0–46.0)
Hemoglobin: 9.5 g/dL — ABNORMAL LOW (ref 12.0–15.0)
O2 Saturation: 100 %
Potassium: 3.5 mmol/L (ref 3.5–5.1)
Sodium: 138 mmol/L (ref 135–145)
TCO2: 22 mmol/L (ref 22–32)
pCO2 arterial: 36.9 mm[Hg] (ref 32–48)
pH, Arterial: 7.369 (ref 7.35–7.45)
pO2, Arterial: 451 mm[Hg] — ABNORMAL HIGH (ref 83–108)

## 2022-11-12 LAB — GLUCOSE, CAPILLARY
Glucose-Capillary: 197 mg/dL — ABNORMAL HIGH (ref 70–99)
Glucose-Capillary: 185 mg/dL — ABNORMAL HIGH (ref 70–99)
Glucose-Capillary: 183 mg/dL — ABNORMAL HIGH (ref 70–99)

## 2022-11-12 LAB — MAGNESIUM: Magnesium: 2.2 mg/dL (ref 1.7–2.4)

## 2022-11-12 LAB — LACTIC ACID, PLASMA
Lactic Acid, Venous: 6.4 mmol/L (ref 0.5–1.9)
Lactic Acid, Venous: 2.4 mmol/L (ref 0.5–1.9)

## 2022-11-12 LAB — BRAIN NATRIURETIC PEPTIDE: B Natriuretic Peptide: 1292.4 pg/mL — ABNORMAL HIGH (ref 0.0–100.0)

## 2022-11-12 MED ORDER — ACETAMINOPHEN 325 MG PO TABS
650.0000 mg | ORAL_TABLET | ORAL | Status: DC | PRN
  Administered 2022-11-14: 650 mg via ORAL
  Filled 2022-11-12: qty 2

## 2022-11-12 MED ORDER — SODIUM CHLORIDE 0.9 % IV SOLN: 250.0000 mL | INTRAVENOUS | Status: DC

## 2022-11-12 MED ORDER — ENOXAPARIN SODIUM 30 MG/0.3ML IJ SOSY
30.0000 mg | PREFILLED_SYRINGE | INTRAMUSCULAR | Status: DC
  Administered 2022-11-12 – 2022-11-22 (×11): 30 mg via SUBCUTANEOUS
  Filled 2022-11-12 (×11): qty 0.3

## 2022-11-12 MED ORDER — MAGNESIUM SULFATE 2 GM/50ML IV SOLN: 2.0000 g | Freq: Once | INTRAVENOUS | Status: DC | PRN

## 2022-11-12 MED ORDER — FAMOTIDINE 20 MG PO TABS
10.0000 mg | ORAL_TABLET | Freq: Every day | ORAL | Status: DC
  Administered 2022-11-12 – 2022-11-21 (×10): 10 mg
  Filled 2022-11-12 (×11): qty 1

## 2022-11-12 MED ORDER — FENTANYL BOLUS VIA INFUSION: 25.0000 ug | INTRAVENOUS | Status: DC | PRN

## 2022-11-12 MED ORDER — ACETAMINOPHEN 650 MG RE SUPP: 650.0000 mg | RECTAL | Status: DC

## 2022-11-12 MED ORDER — BUSPIRONE HCL 15 MG PO TABS: 30.0000 mg | ORAL_TABLET | Freq: Three times a day (TID) | ORAL | Status: DC | PRN

## 2022-11-12 MED ORDER — NOREPINEPHRINE 4 MG/250ML-% IV SOLN
0.0000 ug/min | INTRAVENOUS | Status: DC
  Administered 2022-11-12: 15 ug/min via INTRAVENOUS

## 2022-11-12 MED ORDER — FENTANYL CITRATE PF 50 MCG/ML IJ SOSY
25.0000 ug | PREFILLED_SYRINGE | Freq: Once | INTRAMUSCULAR | Status: AC
  Administered 2022-11-12: 25 ug via INTRAVENOUS

## 2022-11-12 MED ORDER — INSULIN ASPART 100 UNIT/ML IJ SOLN
0.0000 [IU] | INTRAMUSCULAR | Status: DC
  Administered 2022-11-12 – 2022-11-13 (×4): 3 [IU] via SUBCUTANEOUS
  Administered 2022-11-13 (×2): 2 [IU] via SUBCUTANEOUS
  Administered 2022-11-13: 3 [IU] via SUBCUTANEOUS
  Administered 2022-11-14: 2 [IU] via SUBCUTANEOUS
  Administered 2022-11-14: 3 [IU] via SUBCUTANEOUS
  Administered 2022-11-14: 2 [IU] via SUBCUTANEOUS
  Administered 2022-11-14: 3 [IU] via SUBCUTANEOUS
  Administered 2022-11-14: 2 [IU] via SUBCUTANEOUS
  Administered 2022-11-15 (×2): 3 [IU] via SUBCUTANEOUS
  Administered 2022-11-15: 2 [IU] via SUBCUTANEOUS
  Administered 2022-11-15: 3 [IU] via SUBCUTANEOUS
  Administered 2022-11-15 (×2): 2 [IU] via SUBCUTANEOUS
  Administered 2022-11-16: 3 [IU] via SUBCUTANEOUS
  Administered 2022-11-16: 2 [IU] via SUBCUTANEOUS
  Administered 2022-11-16: 8 [IU] via SUBCUTANEOUS
  Administered 2022-11-16 (×2): 2 [IU] via SUBCUTANEOUS
  Administered 2022-11-16: 5 [IU] via SUBCUTANEOUS
  Administered 2022-11-17: 8 [IU] via SUBCUTANEOUS

## 2022-11-12 MED ORDER — ACETAMINOPHEN 325 MG PO TABS: 650.0000 mg | ORAL_TABLET | ORAL | Status: DC

## 2022-11-12 MED ORDER — NOREPINEPHRINE 4 MG/250ML-% IV SOLN
2.0000 ug/min | INTRAVENOUS | Status: DC
  Filled 2022-11-12: qty 250

## 2022-11-12 MED ORDER — POLYETHYLENE GLYCOL 3350 17 G PO PACK
17.0000 g | PACK | Freq: Every day | ORAL | Status: DC
  Administered 2022-11-14 – 2022-11-17 (×4): 17 g
  Filled 2022-11-12 (×4): qty 1

## 2022-11-12 MED ORDER — FENTANYL 2500MCG IN NS 250ML (10MCG/ML) PREMIX INFUSION
25.0000 ug/h | INTRAVENOUS | Status: DC
  Administered 2022-11-12: 50 ug/h via INTRAVENOUS
  Filled 2022-11-12: qty 250

## 2022-11-12 MED ORDER — FENTANYL CITRATE PF 50 MCG/ML IJ SOSY: 25.0000 ug | PREFILLED_SYRINGE | INTRAMUSCULAR | Status: DC | PRN

## 2022-11-12 MED ORDER — ACETAMINOPHEN 160 MG/5ML PO SOLN: 650.0000 mg | ORAL | Status: DC

## 2022-11-12 MED ORDER — ONDANSETRON HCL 4 MG/2ML IJ SOLN
4.0000 mg | Freq: Four times a day (QID) | INTRAMUSCULAR | Status: DC | PRN
  Administered 2022-11-13 – 2022-11-15 (×4): 4 mg via INTRAVENOUS
  Filled 2022-11-12 (×4): qty 2

## 2022-11-12 MED ORDER — ACETAMINOPHEN 160 MG/5ML PO SOLN: 650.0000 mg | ORAL | Status: DC | PRN

## 2022-11-12 MED ORDER — ACETAMINOPHEN 650 MG RE SUPP: 650.0000 mg | RECTAL | Status: DC | PRN

## 2022-11-12 MED ORDER — DOCUSATE SODIUM 50 MG/5ML PO LIQD
100.0000 mg | Freq: Two times a day (BID) | ORAL | Status: DC
  Administered 2022-11-12 – 2022-11-17 (×9): 100 mg
  Filled 2022-11-12 (×9): qty 10

## 2022-11-12 MED ORDER — CHLORHEXIDINE GLUCONATE CLOTH 2 % EX PADS
6.0000 | MEDICATED_PAD | Freq: Every day | CUTANEOUS | Status: DC
  Administered 2022-11-12 – 2022-11-14 (×4): 6 via TOPICAL

## 2022-11-12 MED ORDER — PROPOFOL 1000 MG/100ML IV EMUL
5.0000 ug/kg/min | INTRAVENOUS | Status: DC
  Administered 2022-11-12: 10 ug/kg/min via INTRAVENOUS

## 2022-11-12 MED ORDER — DOCUSATE SODIUM 100 MG PO CAPS: 100.0000 mg | ORAL_CAPSULE | Freq: Two times a day (BID) | ORAL | Status: DC | PRN

## 2022-11-12 MED ORDER — POLYETHYLENE GLYCOL 3350 17 G PO PACK: 17.0000 g | PACK | Freq: Every day | ORAL | Status: DC | PRN

## 2022-11-12 NOTE — Progress Notes (Signed)
RT note. Patient transported from ED to 40M rm 2 on vent without any complications. RT will continue to monitor.    11/12/22 1508  Vent Select  $ Transport Ventilator  Yes  Invasive or Noninvasive Invasive  Adult Vent Y  Airway 7.5 mm  Placement Date/Time: 11/12/22 0724   Difficult airway due to:: Difficulty was unanticipated;Difficult airway - due to anterior larynx  Placed By: ED Physician  Airway Device: Endotracheal Tube  Laryngoscope Blade: Miller;4  ETT Types: Oral  Size (mm):...  Secured at (cm) 23 cm  Measured From Lips  Secured Location Right  Secured By Brewing technologist Repositioned Yes  Cuff Pressure (cm H2O) Clear OR 27-39 CmH2O  Adult Ventilator Settings  Vent Type Servo i  Humidity HME  Vent Mode PRVC  Vt Set 400 mL  Set Rate 22 bmp  FiO2 (%) 60 %  I Time 0.9 Sec(s)  PEEP 5 cmH20  Adult Ventilator Measurements  Peak Airway Pressure 17 L/min  Mean Airway Pressure 9 cmH20  Resp Rate Spontaneous 0 br/min  Resp Rate Total 22 br/min  Exhaled Vt 388 mL  Measured Ve 8.5 L  I:E Ratio Measured 1:2.0  Auto PEEP 0 cmH20  Total PEEP 5 cmH20  SpO2 100 %  Adult Ventilator Alarms  Alarms On Y  Ve High Alarm 20 L/min  Ve Low Alarm 4 L/min  Resp Rate High Alarm 42 br/min  Resp Rate Low Alarm 8  PEEP Low Alarm 3 cmH2O  Press High Alarm 45 cmH2O  T Apnea 20 sec(s)

## 2022-11-12 NOTE — Plan of Care (Signed)

## 2022-11-12 NOTE — ED Notes (Signed)
EEG done

## 2022-11-12 NOTE — Progress Notes (Signed)
EEG complete - results pending

## 2022-11-12 NOTE — ED Triage Notes (Signed)
Pt here from home post cardiac arrest , pt was last seen normal at 4am , EMS called out at 6 10 am cpr in progress , upon ems arrival pt was in PEA went into vfib shocked 3 times , received 5 epis and amiodarone , arrived to the Ed with a king airway and pulses present

## 2022-11-12 NOTE — Procedures (Signed)
Patient Name: [name]  MRN: [id number]  Epilepsy Attending: Charlsie Quest  Referring Physician/Provider: Cheri Fowler, MD  Date: 11/12/2022 Duration: 21.24 mins  Patient history: 72 year old female status post cardiac arrest.  EEG to alert for seizure.  Level of alertness: comatose  AEDs during EEG study: None  Technical aspects: This EEG study was done with scalp electrodes positioned according to the 10-20 International system of electrode placement. Electrical activity was reviewed with band pass filter of 1-70Hz , sensitivity of 7 uV/mm, display speed of 38mm/sec with a 60Hz  notched filter applied as appropriate. EEG data were recorded continuously and digitally stored.  Video monitoring was available and reviewed as appropriate.  Description: EEG showed continuous generalized background suppression, not reactive to stimulation.  Hyperventilation and photic stimulation were not performed.     ABNORMALITY -Background suppression, generalized  IMPRESSION: This study is suggestive of profound diffuse encephalopathy. No seizures or epileptiform discharges were seen throughout the recording.  Dr.Chand  was notified.   Latora Quarry Annabelle Harman

## 2022-11-12 NOTE — Consult Note (Signed)
Cardiology Consultation   Patient ID: PATTIE STINE MRN: [id number]; DOB: 10-22-1950  Admit date: 2022/12/03 Date of Consult: Dec 03, 2022  PCP:  Chauncey Mann, DO   [facility] Providers Cardiologist:  Olga Millers, MD        Patient Profile:   [name] is a 72 y.o. female with a hx of CAD s/p CABG 2016, ischemic cardiomyopathy, hypertension, hyperlipidemia, DM2, CKD stage IV and history of left bundle branch block who is being seen Dec 03, 2022 for the evaluation of PEA/Vfib arrest at the request of Dr. Merrily Pew.  History of Present Illness:   [name] is a 72 year old female with past medical history of CAD s/p CABG 2016, ischemic cardiomyopathy, hypertension, hyperlipidemia, DM2, CKD stage IV and history of left bundle branch block.  She has a history of medication noncompliance.  Echocardiogram obtained in September 2023 demonstrated EF 25 to 30%, septal akinesis, severe MR.  Repeat echocardiogram obtained on 06/05/2022 showed EF less than 20%, grade 1 DD, RVSP 19.8 mmHg, moderate LAE, moderate MR.  During the previous office visit, Dr. Jens Som has discussed with the patient possibility of considering ICD implantation, patient declined ICD.  More recently, patient was admitted to the hospital on 07/26/2022 was acute on chronic systolic heart failure.  She underwent IV diuresis and was discharged with a dry weight of 76 kg.  Volume overload was secondary to medication noncompliance.  Since discharge, she has been living with her daughter.  Family is not sure if she has been compliant with her medication as she managed her own medication.  She does not do much strenuous activity.  She has been able to walk around and going up and down 13 steps of stairs at home.  Family is not aware of any recent chest pain or worsening shortness of breath.  The last time she was seen walking to the bathroom was around 4 AM this morning.  Her daughter who is staying in the next room  heard a thud around 6:15 AM this morning and asked her grandson to check on the patient.  The patient's grandson found the patient slumped over by the bedside.  EMS was called, on arrival, patient reportedly was in PEA arrest then converted to V-fib.  She was defibrillated 3 times, given a total of 5 epi and a 300 mg IV amiodarone before ROSC.  Glucose on arrival was 145.  Family states she would like to be DNR.  On arrival, significant blood work showed creatinine 2.41, baseline creatinine 2.12.  AST 535, ALT 443, troponin 542.  BNP 1292.  Hemoglobin 9.2.  Chest x-ray showed chronic cardiomegaly was mid left lung scarring and atelectasis, resolved pleural effusion when compared to the previous chest x-ray in June.  Patient is currently intubated and sedated.  She is on Levophed and propofol. Cardiology service consulted for V-fib arrest.  Past Medical History:  Diagnosis Date   Acute exacerbation of CHF (congestive heart failure) (HCC) 09/12/2017   Acute gallstone pancreatitis    Acute gallstone pancreatitis    Acute on chronic systolic heart failure (HCC)    Bilateral pleural effusion 02/22/2014   Cardiomyopathy, ischemic, EF 25-30% 03/23/2014   03/01/2014- Date of procedure- CABG- X5- Severe 3-vessel coronary artery disease with severe LV dysfunction.  Ejection fraction 25-30%.  SURGICAL PROCEDURE:  Coronary artery bypass grafting x 5. SURGEON:  Sheliah Plane, MD.      Cardiomyopathy, ischemic, EF 25-30% 03/23/2014   03/01/2014- Date of procedure- CABG- X5- Severe 3-vessel  coronary artery disease with severe LV dysfunction.  Ejection fraction 25-30%.  SURGICAL PROCEDURE:  Coronary artery bypass grafting x 5. SURGEON:  Sheliah Plane, MD.      Chronic systolic heart failure [facility])    Coronary artery disease involving native coronary artery of native heart without angina pectoris    Diabetes mellitus 1998   Dx in 1998. Microalbuminuria, never on insulin.   Diabetes mellitus type 2, controlled, without  complications (HCC) 12/05/2005   Last A1C 6.7 (03/21/17)    On Diet Control Only Last Foot Exam: 03/21/17    Last eye exam: to be scheduled early 2019    Dyspnea    Essential hypertension 12/05/2005   Lisinopril 40mg  Daily, Coreg 25mg  BID.    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   Viral URI with cough 03/21/2017    Past Surgical History:  Procedure Laterality Date   CHOLECYSTECTOMY N/A 08/06/2016   Procedure: LAPAROSCOPIC CHOLECYSTECTOMY WITH INTRAOPERATIVE CHOLANGIOGRAM;  Surgeon: Violeta Gelinas, MD;  Location[facility];  Service: General;  Laterality: N/A;   COLONOSCOPY WITH PROPOFOL N/A 05/22/2022   Procedure: COLONOSCOPY WITH PROPOFOL;  Surgeon: Vida Rigger, MD;  Location: [facility] ENDOSCOPY;  Service: Gastroenterology;  Laterality: N/A;   CORONARY ARTERY BYPASS GRAFT N/A 03/01/2014   Procedure: CORONARY ARTERY BYPASS GRAFTING (CABG) x five, using left internal mammary artery and right leg greater saphenous vein harvested endoscopically;  Surgeon: Delight Ovens, MD;  Location: [facility] OR;  Service: Open Heart Surgery;  Laterality: N/A;   INTRAOPERATIVE TRANSESOPHAGEAL ECHOCARDIOGRAM N/A 03/01/2014   Procedure: INTRAOPERATIVE TRANSESOPHAGEAL ECHOCARDIOGRAM;  Surgeon: Delight Ovens, MD;  Location: [facility] OR;  Service: Open Heart Surgery;  Laterality: N/A;   LEFT AND RIGHT HEART CATHETERIZATION WITH CORONARY ANGIOGRAM N/A 02/26/2014   Procedure: LEFT AND RIGHT HEART CATHETERIZATION WITH CORONARY ANGIOGRAM;  Surgeon: Lesleigh Noe, MD;  Location: [facility] CATH LAB;  Service: Cardiovascular;  Laterality: N/A;   POLYPECTOMY  05/22/2022   Procedure: POLYPECTOMY;  Surgeon: Vida Rigger, MD;  Location: [facility] ENDOSCOPY;  Service:  Gastroenterology;;   TOTAL ABDOMINAL HYSTERECTOMY W/ BILATERAL SALPINGOOPHORECTOMY  1996     Home Medications:  Prior to Admission medications   Medication Sig Start Date End Date Taking? Authorizing Provider  aspirin EC 81 MG tablet Take 1 tablet (81 mg total) by mouth daily. Swallow whole. 08/13/22  Yes Clegg, Amy D, NP  atorvastatin (LIPITOR) 80 MG tablet TAKE 1 TABLET BY MOUTH DAILY 06/20/22  Yes Crenshaw, Madolyn Frieze, MD  calcium carbonate (OSCAL) 1500 (600 Ca) MG TABS tablet Take 1,500 mg by mouth daily.   Yes [provider]  carvedilol (COREG) 25 MG tablet Take 1 tablet (25 mg total) by mouth 2 (two) times daily with a meal. 07/30/22  Yes Atway, Rayann N, DO  empagliflozin (JARDIANCE) 25 MG TABS tablet TAKE 1 TABLET BY MOUTH DAILY 06/26/22  Yes Doran Stabler, DO  ezetimibe (ZETIA) 10 MG tablet TAKE ONE TABLET BY MOUTH DAILY 11/13/21  Yes Lewayne Bunting, MD  furosemide (LASIX) 80 MG tablet Take 80 mg by mouth 2 (two) times daily. 09/06/22  Yes [provider]  sacubitril-valsartan (ENTRESTO) 24-26 MG Take 1  tablet by mouth 2 (two) times daily. 08/13/22  Yes Clegg, Amy D, NP  Insulin Pen Needle (PEN NEEDLES) 32G X 4 MM MISC 1 each by Does not apply route as directed. 03/28/22   Chauncey Mann, DO    Inpatient Medications: Scheduled Meds:  docusate  100 mg Per Tube BID   enoxaparin (LOVENOX) injection  40 mg Subcutaneous Q24H   famotidine  20 mg Per Tube BID   polyethylene glycol  17 g Per Tube Daily   Continuous Infusions:  sodium chloride     magnesium sulfate     norepinephrine (LEVOPHED) Adult infusion 10 mcg/min (11/02/2022 0903)   PRN Meds: [START ON 11/14/2022] acetaminophen **OR** [START ON 11/14/2022] acetaminophen (TYLENOL) oral liquid 160 mg/5 mL **OR** [START ON 11/14/2022] acetaminophen, busPIRone **OR** busPIRone, docusate sodium, fentaNYL, fentaNYL (SUBLIMAZE) injection, fentaNYL (SUBLIMAZE) injection, magnesium sulfate, ondansetron (ZOFRAN) IV, polyethylene  glycol  Allergies:   No Known Allergies  Social History:   Social History   Socioeconomic History   Marital status: Widowed    Spouse name: Not on file   Number of children: 2   Years of education: Not on file   Highest education level: 9th grade  Occupational History   Occupation: Armed forces training and education officer   Occupation: Retired  Tobacco Use   Smoking status: Never   Smokeless tobacco: Never  Vaping Use   Vaping status: Never Used  Substance and Sexual Activity   Alcohol use: No    Alcohol/week: 0.0 standard drinks of alcohol   Drug use: No   Sexual activity: Not on file  Other Topics Concern   Not on file  Social History Narrative   Married x 42 yrs.  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   Never smoked. Doesn't drink or use drugs.   Does Patient Exercise:  yes - sometimes      To get diabetes supplies for Antonietta Barcelona RD, CDE, 2022/06/03   Social Determinants of Health   Financial Resource Strain: Low Risk  (09/04/2022)   Overall Financial Resource Strain (CARDIA)    Difficulty of Paying Living Expenses: Not hard at all  Food Insecurity: Food Insecurity Present (09/04/2022)   Hunger Vital Sign    Worried About Running Out of Food in the Last Year: Sometimes true    Ran Out of Food in the Last Year: Sometimes true  Transportation Needs: Unmet Transportation Needs (09/04/2022)   PRAPARE - Administrator, Civil Service (Medical): Yes    Lack of Transportation (Non-Medical): No  Physical Activity: Insufficiently Active (09/04/2022)   Exercise Vital Sign    Days of Exercise per Week: 2 days    Minutes of Exercise per Session: 30 min  Stress: No Stress Concern Present (09/04/2022)   [employer] of Occupational Health - Occupational Stress Questionnaire    Feeling of Stress : Only a little  Social Connections: Socially Isolated (09/04/2022)   Social Connection and Isolation Panel [NHANES]     Frequency of Communication with Friends and Family: Once a week    Frequency of Social Gatherings with Friends and Family: Once a week    Attends Religious Services: Never    Database administrator or Organizations: No    Attends Banker Meetings: Never    Marital Status: Widowed  Intimate Partner Violence: Not At Risk (01/10/2022)   Humiliation, Afraid, Rape, and Kick questionnaire    Fear of Current or Ex-Partner: No  Emotionally Abused: No    Physically Abused: No    Sexually Abused: No    Family History:    Family History  Problem Relation Age of Onset   Diabetes Mother        Mother died of MI at age 70   Heart failure Mother    Kidney failure Mother    Heart failure Sister        Died   Heart failure Brother        Died   Diabetes Brother        Died   Breast cancer Neg Hx      ROS:  Please see the history of present illness.   All other ROS reviewed and negative.     Physical Exam/Data:   Vitals:   18-Nov-2022 0700 11-18-2022 0724 11-18-22 0730 11-18-22 0745  BP:   100/69 111/65  Pulse:   78 76  Resp:   (!) 21 (!) 22  Temp:    (!) 97.1 F (36.2 C)  SpO2:  100% 100% 100%  Weight: 80 kg     Height:  5\' 2"  (1.575 m)     No intake or output data in the 24 hours ending 11-18-2022 0920    November 18, 2022    7:00 AM 09/04/2022    3:11 PM 09/04/2022    1:54 PM  Last 3 Weights  Weight (lbs) 176 lb 5.9 oz 173 lb 3.2 oz 173 lb 3.2 oz  Weight (kg) 80 kg 78.563 kg 78.563 kg     Body mass index is 32.26 kg/m.  General: Intubated and sedated HEENT: normal Neck: no JVD Vascular: No carotid bruits; Distal pulses 2+ bilaterally Cardiac:  normal S1, S2; RRR; no murmur  Lungs:  clear to auscultation bilaterally, no wheezing, rhonchi or rales.  Intubated Abd: soft, no hepatomegaly  Ext: no edema Musculoskeletal:  No deformities, BUE and BLE strength normal and equal Skin: warm and dry  Neuro: Unable to assess Psych: unable to assess  EKG:  The EKG was  personally reviewed and demonstrates: Sinus rhythm, left bundle branch block. Telemetry:  Telemetry was personally reviewed and demonstrates: Normal sinus rhythm, single transient nonsustained VT lasting 3-4 beats, self terminated.   Relevant CV Studies:  Echo 06/05/2022 1. LV global hypokinesis with septal akinesis. Left ventricular ejection  fraction, by estimation, is <20%. The left ventricle has severely  decreased function. The left ventricle demonstrates global hypokinesis.  Left ventricular diastolic parameters are  consistent with Grade I diastolic dysfunction (impaired relaxation).   2. Right ventricular systolic function is normal. The right ventricular  size is normal. There is normal pulmonary artery systolic pressure. The  estimated right ventricular systolic pressure is 19.8 mmHg.   3. Left atrial size was moderately dilated.   4. The mitral valve is abnormal. Moderate mitral valve regurgitation. No  evidence of mitral stenosis.   5. The aortic valve is grossly normal. There is moderate calcification of  the aortic valve. Aortic valve regurgitation is not visualized. Aortic  valve sclerosis is present, with no evidence of aortic valve stenosis.   6. The inferior vena cava is normal in size with <50% respiratory  variability, suggesting right atrial pressure of 8 mmHg.   Comparison(s): Prior images reviewed side by side. Changes from prior  study are noted.   Conclusion(s)/Recommendation(s): EF appears slightly worse than prior, now  <20%. MR appears more moderate on current study, with no clear diastolic  flow reversal on PV. Consider TEE or  cardiac MRI if clinically indicated.   Laboratory Data:  High Sensitivity Troponin:   Recent Labs  Lab 2022/12/08 0725  TROPONINIHS 542*     Chemistry Recent Labs  Lab 08-Dec-2022 0725 12/08/2022 0731 December 08, 2022 0838  NA 141 141 138  K 4.0 4.2 3.5  CL 106  --   --   CO2 17*  --   --   GLUCOSE 136*  --   --   BUN 32*  --   --    CREATININE 2.41*  --   --   CALCIUM 8.5*  --   --   GFRNONAA 21*  --   --   ANIONGAP 18*  --   --     Recent Labs  Lab 2022/12/08 0725  PROT 5.9*  ALBUMIN 2.6*  AST 535*  ALT 443*  ALKPHOS 130*  BILITOT 1.2   Lipids No results for input(s): "CHOL", "TRIG", "HDL", "LABVLDL", "LDLCALC", "CHOLHDL" in the last 168 hours.  Hematology Recent Labs  Lab 2022-12-08 0725 2022/12/08 0731 12-08-2022 0838  WBC 5.0  --   --   RBC 3.15*  --   --   HGB 9.2* 9.9* 9.5*  HCT 29.6* 29.0* 28.0*  MCV 94.0  --   --   MCH 29.2  --   --   MCHC 31.1  --   --   RDW 16.6*  --   --   PLT 97*  --   --    Thyroid No results for input(s): "TSH", "FREET4" in the last 168 hours.  BNP Recent Labs  Lab 12-08-2022 0735  BNP 1,292.4*    DDimer No results for input(s): "DDIMER" in the last 168 hours.   Radiology/Studies:  DG Chest Portable 1 View  Result Date: 08-Dec-2022 CLINICAL DATA:  72 year old female with altered mental status, intubated. EXAM: PORTABLE CHEST 1 VIEW COMPARISON:  Chest radiograph 07/26/2022 and earlier. FINDINGS: Portable AP supine view at 0732 hours. Endotracheal tube tip in good position just below the level the clavicles. Enteric tube courses to the left upper quadrant, tip not included. Similar lung volumes to June, bilateral layering pleural effusions at that time appear resolved. There is mild residual streaky opacity in the left mid lung, stable and probably scarring or atelectasis. Otherwise when allowing for portable technique the lungs are clear. No pneumothorax. Prior CABG. Stable cardiomegaly. No acute osseous abnormality identified. Negative visible bowel gas. Stable cholecystectomy clips. IMPRESSION: 1. Endotracheal tube in good position and enteric tube continues into the left upper quadrant. 2. Chronic cardiomegaly and mild left mid lung scarring or atelectasis. Resolved pleural effusions seen in June. Electronically Signed   By: Odessa Fleming M.D.   On: 12/08/22 08:44      Assessment and Plan:   PEA/V-fib arrest  -Intubated and sedated, currently on propofol and Levophed.  -Family denies any report of recent shortness of breath or chest discomfort.  However patient has had chronic low EF, most recent echocardiogram in May 2024 showed EF less than 20%.  -Currently not a candidate for intervention, once her condition improves, may consider ischemic workup via cardiac catheterization followed by EP evaluation to discuss ICD again.  Admitted by PCCM.  CAD s/p CABG in 2016: On aspirin and Lipitor at home, medication currently held as patient is intubated.  Ischemic cardiomyopathy with baseline EF less than 20%  Hypertension: Hypotensive, receiving Levophed after PEA/V-fib arrest  Hyperlipidemia: Plan to resume statin once she is extubated  DM2  Elevated transaminases: Likely due to  shock liver  CKD stage IV: Creatinine on arrival 2.4, slightly higher than baseline of 1.9-2.1.   Risk Assessment/Risk Scores:     TIMI Risk Score for Unstable Angina or Non-ST Elevation MI:   The patient's TIMI risk score is 5, which indicates a 26% risk of all cause mortality, new or recurrent myocardial infarction or need for urgent revascularization in the next 14 days.          For questions or updates, please contact [facility] Please consult [email or web address] for contact info under    Signed, Azalee Course, PA  11-15-22 9:20 AM

## 2022-11-12 NOTE — H&P (Signed)
NAME:  [name], MRN:  [id number], DOB:  1950-12-28, LOS: 0 ADMISSION DATE:  12-10-22, CONSULTATION DATE: 12-10-2022 REFERRING MD:  Rexford Maus , CHIEF COMPLAINT: S/p cardiac arrest  History of Present Illness:  72 year old female with coronary artery disease status post CABG, chronic combined HFpEF/HFrEF, diabetes type 2, hypertension and hyperlipidemia who was brought into the emergency department status postcardiac arrest.  Per patient's family she was last seen around 4 AM, in the morning patient's family heard a thud, they went to see her, she was found unresponsive, EMS was called, bystander CPR was started initially patient was noted to be in PEA arrest but then converted to V-fib, she was defibrillated x 3.  Received 5 of epi and amiodarone after that ROSC was achieved.  Patient was brought in the emergency department she was intubated.  PCCM was consulted for help evaluation medical management  Pertinent  Medical History   Past Medical History:  Diagnosis Date   Acute exacerbation of CHF (congestive heart failure) (HCC) 09/12/2017   Acute gallstone pancreatitis    Acute gallstone pancreatitis    Acute on chronic systolic heart failure (HCC)    Bilateral pleural effusion 02/22/2014   Cardiomyopathy, ischemic, EF 25-30% 03/23/2014   03/01/2014- Date of procedure- CABG- X5- Severe 3-vessel coronary artery disease with severe LV dysfunction.  Ejection fraction 25-30%.  SURGICAL PROCEDURE:  Coronary artery bypass grafting x 5. SURGEON:  Sheliah Plane, MD.      Cardiomyopathy, ischemic, EF 25-30% 03/23/2014   03/01/2014- Date of procedure- CABG- X5- Severe 3-vessel coronary artery disease with severe LV dysfunction.  Ejection fraction 25-30%.  SURGICAL PROCEDURE:  Coronary artery bypass grafting x 5. SURGEON:  Sheliah Plane, MD.      Chronic systolic heart failure [facility])    Coronary artery disease involving native coronary artery of native heart without angina pectoris     Diabetes mellitus 1998   Dx in 1998. Microalbuminuria, never on insulin.   Diabetes mellitus type 2, controlled, without complications (HCC) 12/05/2005   Last A1C 6.7 (03/21/17)    On Diet Control Only Last Foot Exam: 03/21/17    Last eye exam: to be scheduled early 2019    Dyspnea    Essential hypertension 12/05/2005   Lisinopril 40mg  Daily, Coreg 25mg  BID.    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   Viral URI with cough 03/21/2017     Significant Hospital Events: Including procedures, antibiotic start and stop dates in addition to other pertinent events     Interim History / Subjective:  As above  Objective   Blood pressure 111/65, pulse 76, temperature (!) 97.1 F (36.2 C), resp. rate (!) 22, height 5\' 2"  (1.575 m), weight 80 kg, SpO2 100%.    Vent Mode: PRVC FiO2 (%):  [100 %] 100 % Set Rate:  [22 bmp] 22 bmp Vt Set:  [400 mL] 400 mL PEEP:  [5 cmH20] 5 cmH20 Plateau Pressure:  [17 cmH20] 17 cmH20  No intake or output data in the 24 hours ending 10-Dec-2022 0856 Filed Weights   Dec 10, 2022 0700  Weight: 80 kg    Examination: General: Crtitically ill-appearing elderly female,  orally intubated HEENT: [place]/AT, eyes anicteric.  ETT and OGT in place Neuro: Sedated, not following commands.  Eyes are closed.  Pupils 2 mm bilateral sluggishly reactive to light Chest: Coarse breath sounds, no wheezes or rhonchi Heart: Regular rate and rhythm, no murmurs or gallops Abdomen: Soft, nondistended, bowel sounds present Skin: No rash  Labs and images reviewed  Resolved Hospital Problem list     Assessment & Plan:  Status post PEA followed by V-fib cardiac arrest Acute respiratory failure with  hypoxia Chronic combined HFrEF/HFpEF Coronary artery disease status post CABG AKI on CKD stage IV Hypocalcemia Hyperlipidemia Diabetes type 2 Anemia of chronic disease Thrombocytopenia of critical illness Acute encephalopathy, postcardiac arrest  Continue normothermia protocol Monitor and supplement electrolytes Continue telemetry monitoring Continue amiodarone Cardiology is consulting Continue lung protective ventilation, FiO2 was titrated down to 60%, continue to titrate down with O2 sat goal 92% Monitor intake and output Echocardiogram in July 2024 showing EF less than 20% with diastolic dysfunction Avoid nephrotoxic agent Trend troponins, initial troponin is 542 Patient's EKG showing left bundle branch block which is chronic Continue sliding scale insulin with CBG goal 140-180 Monitor H&H and platelet count   Best Practice (right click and "Reselect all SmartList Selections" daily)   Diet/type: NPO DVT prophylaxis: LMWH GI prophylaxis: H2B Lines: N/A Foley:  Yes, and it is still needed Code Status:  DNR Last date of multidisciplinary goals of care discussion [10/14: Patient's family was updated at bedside, decision was to continue full scope of care while keeping her DNR]  Labs   CBC: Recent Labs  Lab 11/10/2022 0725 11/22/2022 0731 11/09/2022 0838  WBC 5.0  --   --   NEUTROABS 2.7  --   --   HGB 9.2* 9.9* 9.5*  HCT 29.6* 29.0* 28.0*  MCV 94.0  --   --   PLT 97*  --   --     Basic Metabolic Panel: Recent Labs  Lab 11/07/2022 0731 11/19/2022 0838  NA 141 138  K 4.2 3.5   GFR: CrCl cannot be calculated (Patient's most recent lab result is older than the maximum 21 days allowed.). Recent Labs  Lab 11/06/2022 0725  WBC 5.0  LATICACIDVEN 6.4*    Liver Function Tests: No results for input(s): "AST", "ALT", "ALKPHOS", "BILITOT", "PROT", "ALBUMIN" in the last 168 hours. Recent Labs  Lab 11/02/2022 0735  LIPASE 29   No results for input(s): "AMMONIA" in the  last 168 hours.  ABG    Component Value Date/Time   PHART 7.369 11/22/2022 0838   PCO2ART 36.9 10/30/2022 0838   PO2ART 451 (H) 11/13/2022 0838   HCO3 21.3 11/27/2022 0838   TCO2 22 11/22/2022 0838   ACIDBASEDEF 4.0 (H) 11/18/2022 0838   O2SAT 100 11/13/2022 0838     Coagulation Profile: No results for input(s): "INR", "PROTIME" in the last 168 hours.  Cardiac Enzymes: No results for input(s): "CKTOTAL", "CKMB", "CKMBINDEX", "TROPONINI" in the last 168 hours.  HbA1C: Hemoglobin A1C  Date/Time Value Ref Range Status  01/10/2022 03:26 PM 11.1 (A) 4.0 - 5.6 % Final  06/29/2021 02:42 PM 8.5 (A) 4.0 - 5.6 % Final   Hgb A1c MFr Bld  Date/Time Value Ref Range Status  07/27/2022 12:39 AM 7.9 (H) 4.8 - 5.6 % Final    Comment:    (NOTE)         Prediabetes: 5.7 - 6.4         Diabetes: >6.4  Glycemic control for adults with diabetes: <7.0   09/17/2017 10:00 AM 6.7 (H) 4.8 - 5.6 % Final    Comment:    (NOTE) Pre diabetes:          5.7%-6.4% Diabetes:              >6.4% Glycemic control for   <7.0% adults with diabetes     CBG: No results for input(s): "GLUCAP" in the last 168 hours.  Review of Systems:   Unable to obtain as patient is encephalopathic postcardiac arrest  Past Medical History:  She,  has a past medical history of Acute exacerbation of CHF (congestive heart failure) (HCC) (09/12/2017), Acute gallstone pancreatitis, Acute gallstone pancreatitis, Acute on chronic systolic heart failure (HCC), Bilateral pleural effusion (02/22/2014), Cardiomyopathy, ischemic, EF 25-30% (03/23/2014), Cardiomyopathy, ischemic, EF 25-30% (03/23/2014), Chronic systolic heart failure (HCC), Coronary artery disease involving native coronary artery of native heart without angina pectoris, Diabetes mellitus (1998), Diabetes mellitus type 2, controlled, without complications (HCC) (12/05/2005), Dyspnea, Essential hypertension (12/05/2005), GERD (12/05/2005), GERD (gastroesophageal reflux  disease), History of blurry vision (06/09), Hyperlipidemia, Hypertension, Non-intractable vomiting (12/31/2017), Personal history of colonic adenomas (10/07/2007), Pneumonia (01/2014), S/P CABG x 5 (03/01/2014), and Viral URI with cough (03/21/2017).   Surgical History:   Past Surgical History:  Procedure Laterality Date   CHOLECYSTECTOMY N/A 08/06/2016   Procedure: LAPAROSCOPIC CHOLECYSTECTOMY WITH INTRAOPERATIVE CHOLANGIOGRAM;  Surgeon: Violeta Gelinas, MD;  Location: [facility] OR;  Service: General;  Laterality: N/A;   COLONOSCOPY WITH PROPOFOL N/A 05/22/2022   Procedure: COLONOSCOPY WITH PROPOFOL;  Surgeon: Vida Rigger, MD;  Location: [facility] ENDOSCOPY;  Service: Gastroenterology;  Laterality: N/A;   CORONARY ARTERY BYPASS GRAFT N/A 03/01/2014   Procedure: CORONARY ARTERY BYPASS GRAFTING (CABG) x five, using left internal mammary artery and right leg greater saphenous vein harvested endoscopically;  Surgeon: Delight Ovens, MD;  Location: [facility] OR;  Service: Open Heart Surgery;  Laterality: N/A;   INTRAOPERATIVE TRANSESOPHAGEAL ECHOCARDIOGRAM N/A 03/01/2014   Procedure: INTRAOPERATIVE TRANSESOPHAGEAL ECHOCARDIOGRAM;  Surgeon: Delight Ovens, MD;  Location: [facility] OR;  Service: Open Heart Surgery;  Laterality: N/A;   LEFT AND RIGHT HEART CATHETERIZATION WITH CORONARY ANGIOGRAM N/A 02/26/2014   Procedure: LEFT AND RIGHT HEART CATHETERIZATION WITH CORONARY ANGIOGRAM;  Surgeon: Lesleigh Noe, MD;  Location: [facility] CATH LAB;  Service: Cardiovascular;  Laterality: N/A;   POLYPECTOMY  05/22/2022   Procedure: POLYPECTOMY;  Surgeon: Vida Rigger, MD;  Location: [facility] ENDOSCOPY;  Service: Gastroenterology;;   TOTAL ABDOMINAL HYSTERECTOMY W/ BILATERAL SALPINGOOPHORECTOMY  1996     Social History:   reports that she has never smoked. She has never used smokeless tobacco. She reports that she does not drink alcohol and does not use drugs.   Family History:  Her family history includes Diabetes in her brother and mother; Heart  failure in her brother, mother, and sister; Kidney failure in her mother. There is no history of Breast cancer.   Allergies No Known Allergies   Home Medications  Prior to Admission medications   Medication Sig Start Date End Date Taking? Authorizing Provider  aspirin EC 81 MG tablet Take 1 tablet (81 mg total) by mouth daily. Swallow whole. 08/13/22   Clegg, Amy D, NP  atorvastatin (LIPITOR) 80 MG tablet TAKE 1 TABLET BY MOUTH DAILY 06/20/22   Lewayne Bunting, MD  calcium carbonate (OSCAL) 1500 (600 Ca) MG TABS tablet Take 1,500 mg by mouth daily.    [provider]  carvedilol (COREG) 25 MG tablet Take  1 tablet (25 mg total) by mouth 2 (two) times daily with a meal. 07/30/22   Atway, Rayann N, DO  empagliflozin (JARDIANCE) 25 MG TABS tablet TAKE 1 TABLET BY MOUTH DAILY 06/26/22   Doran Stabler, DO  ezetimibe (ZETIA) 10 MG tablet TAKE ONE TABLET BY MOUTH DAILY 11/13/21   Lewayne Bunting, MD  furosemide (LASIX) 40 MG tablet Take 1 tablet (40 mg total) by mouth 2 (two) times daily. 07/30/22   Morrie Sheldon, MD  furosemide (LASIX) 80 MG tablet Take 80 mg by mouth 2 (two) times daily. 09/06/22   [provider]  Insulin Pen Needle (PEN NEEDLES) 32G X 4 MM MISC 1 each by Does not apply route as directed. 03/28/22   Atway, Rayann N, DO  sacubitril-valsartan (ENTRESTO) 24-26 MG Take 1 tablet by mouth 2 (two) times daily. 08/13/22   Clegg, Amy D, NP  Semaglutide,0.25 or 0.5MG /DOS, 2 MG/3ML SOPN Inject 0.5 mg into the skin once a week. 08/07/22   Kathleen Lime, MD     Critical care time:      The patient is critically ill due to status postcardiac arrest critical care was necessary to treat or prevent imminent or life-threatening deterioration.  Critical care was time spent personally by me on the following activities: development of treatment plan with patient and/or surrogate as well as nursing, discussions with consultants, evaluation of patient's response to treatment, examination of  patient, obtaining history from patient or surrogate, ordering and performing treatments and interventions, ordering and review of laboratory studies, ordering and review of radiographic studies, pulse oximetry, re-evaluation of patient's condition and participation in multidisciplinary rounds.   During this encounter critical care time was devoted to patient care services described in this note for 44 minutes.     Cheri Fowler, MD [facility] See Amion for pager If no response to pager, please call [phone] until 7pm After 7pm, Please call E-link [phone]

## 2022-11-12 NOTE — ED Provider Notes (Signed)
[place] EMERGENCY DEPARTMENT AT [facility] Provider Note   CSN: [id number] Arrival date & time: 11/12/22  0719     History  Chief Complaint  Patient presents with   Cardiac Arrest    DOLOROS KWOLEK is a 72 y.o. female.  Patient is a 72 year old female with a past medical history of CAD status post CABG, CHF with reduced EF, CKD, diabetes presenting to the emergency department post cardiac arrest.  Per EMS, the patient was last seen by family around 4 AM going to the bathroom, conversing and acting like her normal self.  They state when they checked in on her again around 6:30 AM she was found to be unresponsive and they called 911.  Bystander CPR was started.  On EMS arrival the patient was initially in PEA arrest and then converted to V-fib.  She was shocked a total of 3 times, given a total of 5 epi and given 300 mg of amnio with return of circulation.  Glucose on arrival was 145.  Family reports that she has been like her normal self recently and has not had any complaints, no chest pain or shortness of breath.  They state that she would like to be DNR.  The history is provided by a relative and the EMS personnel.  Cardiac Arrest      Home Medications Prior to Admission medications   Medication Sig Start Date End Date Taking? Authorizing Provider  aspirin EC 81 MG tablet Take 1 tablet (81 mg total) by mouth daily. Swallow whole. 08/13/22   Clegg, Amy D, NP  atorvastatin (LIPITOR) 80 MG tablet TAKE 1 TABLET BY MOUTH DAILY 06/20/22   Lewayne Bunting, MD  calcium carbonate (OSCAL) 1500 (600 Ca) MG TABS tablet Take 1,500 mg by mouth daily.    [provider]  carvedilol (COREG) 25 MG tablet Take 1 tablet (25 mg total) by mouth 2 (two) times daily with a meal. 07/30/22   Atway, Rayann N, DO  empagliflozin (JARDIANCE) 25 MG TABS tablet TAKE 1 TABLET BY MOUTH DAILY 06/26/22   Doran Stabler, DO  ezetimibe (ZETIA) 10 MG tablet TAKE ONE TABLET BY MOUTH DAILY 11/13/21    Lewayne Bunting, MD  furosemide (LASIX) 40 MG tablet Take 1 tablet (40 mg total) by mouth 2 (two) times daily. 07/30/22   Morrie Sheldon, MD  furosemide (LASIX) 80 MG tablet Take 80 mg by mouth 2 (two) times daily. 09/06/22   [provider]  Insulin Pen Needle (PEN NEEDLES) 32G X 4 MM MISC 1 each by Does not apply route as directed. 03/28/22   Atway, Rayann N, DO  sacubitril-valsartan (ENTRESTO) 24-26 MG Take 1 tablet by mouth 2 (two) times daily. 08/13/22   Clegg, Amy D, NP  Semaglutide,0.25 or 0.5MG /DOS, 2 MG/3ML SOPN Inject 0.5 mg into the skin once a week. 08/07/22   Kathleen Lime, MD      Allergies    Patient has no known allergies.    Review of Systems   Review of Systems  Physical Exam Updated Vital Signs BP 111/65   Pulse 76   Temp (!) 97.1 F (36.2 C)   Resp (!) 22   Ht 5\' 2"  (1.575 m)   Wt 80 kg   SpO2 100%   BMI 32.26 kg/m  Physical Exam Vitals and nursing note reviewed.  Constitutional:      Comments: Unresponsive, GCS 3  HENT:     Head: Normocephalic and atraumatic.  Nose: Nose normal.     Mouth/Throat:     Mouth: Mucous membranes are moist.     Pharynx: Oropharynx is clear.  Eyes:     Comments: Pupils 3 mm bilaterally  Cardiovascular:     Rate and Rhythm: Normal rate and regular rhythm.     Heart sounds: Normal heart sounds.  Pulmonary:     Effort: Pulmonary effort is normal.     Breath sounds: Normal breath sounds.     Comments: Spontaneous respirations  Abdominal:     General: Abdomen is flat.     Palpations: Abdomen is soft.     Tenderness: There is no abdominal tenderness.  Musculoskeletal:        General: No swelling or deformity.     Cervical back: Normal range of motion.  Skin:    General: Skin is warm and dry.  Neurological:     Comments: GCS 3, no corneal reflex, no gag, spontaneous respirations      ED Results / Procedures / Treatments   Labs (all labs ordered are listed, but only abnormal results are displayed) Labs  Reviewed  CBC - Abnormal; Notable for the following components:      Result Value   RBC 3.15 (*)    Hemoglobin 9.2 (*)    HCT 29.6 (*)    RDW 16.6 (*)    Platelets 97 (*)    All other components within normal limits  LACTIC ACID, PLASMA - Abnormal; Notable for the following components:   Lactic Acid, Venous 6.4 (*)    All other components within normal limits  BRAIN NATRIURETIC PEPTIDE - Abnormal; Notable for the following components:   B Natriuretic Peptide 1,292.4 (*)    All other components within normal limits  I-STAT VENOUS BLOOD GAS, ED - Abnormal; Notable for the following components:   pCO2, Ven 39.9 (*)    Bicarbonate 18.1 (*)    TCO2 19 (*)    Acid-base deficit 8.0 (*)    Calcium, Ion 1.08 (*)    HCT 29.0 (*)    Hemoglobin 9.9 (*)    All other components within normal limits  I-STAT ARTERIAL BLOOD GAS, ED - Abnormal; Notable for the following components:   pO2, Arterial 451 (*)    Acid-base deficit 4.0 (*)    Calcium, Ion 1.13 (*)    HCT 28.0 (*)    Hemoglobin 9.5 (*)    All other components within normal limits  DIFFERENTIAL  LIPASE, BLOOD  BASIC METABOLIC PANEL  LACTIC ACID, PLASMA  HEPATIC FUNCTION PANEL  URINALYSIS, W/ REFLEX TO CULTURE (INFECTION SUSPECTED)  BLOOD GAS, ARTERIAL  CBG MONITORING, ED  TROPONIN I (HIGH SENSITIVITY)    EKG EKG Interpretation Date/Time:  Monday November 12 2022 07:22:23 EDT Ventricular Rate:  77 PR Interval:  214 QRS Duration:  184 QT Interval:  517 QTC Calculation: 586 R Axis:   56  Text Interpretation: Sinus rhythm Borderline prolonged PR interval Left bundle branch block No significant change since last tracing Confirmed by Elayne Snare ([id number]) on 11/12/2022 7:35:37 AM  Radiology DG Chest Portable 1 View  Result Date: 11/12/2022 CLINICAL DATA:  72 year old female with altered mental status, intubated. EXAM: PORTABLE CHEST 1 VIEW COMPARISON:  Chest radiograph 07/26/2022 and earlier. FINDINGS: Portable AP supine  view at 0732 hours. Endotracheal tube tip in good position just below the level the clavicles. Enteric tube courses to the left upper quadrant, tip not included. Similar lung volumes to June, bilateral layering pleural effusions at  that time appear resolved. There is mild residual streaky opacity in the left mid lung, stable and probably scarring or atelectasis. Otherwise when allowing for portable technique the lungs are clear. No pneumothorax. Prior CABG. Stable cardiomegaly. No acute osseous abnormality identified. Negative visible bowel gas. Stable cholecystectomy clips. IMPRESSION: 1. Endotracheal tube in good position and enteric tube continues into the left upper quadrant. 2. Chronic cardiomegaly and mild left mid lung scarring or atelectasis. Resolved pleural effusions seen in June. Electronically Signed   By: Odessa Fleming M.D.   On: 11/12/2022 08:44    Procedures .Critical Care  Performed by: Rexford Maus, DO Authorized by: Rexford Maus, DO   Critical care provider statement:    Critical care time (minutes):  35   Critical care time was exclusive of:  Separately billable procedures and treating other patients   Critical care was necessary to treat or prevent imminent or life-threatening deterioration of the following conditions:  Cardiac failure   Critical care was time spent personally by me on the following activities:  Development of treatment plan with patient or surrogate, discussions with consultants, evaluation of patient's response to treatment, examination of patient, obtaining history from patient or surrogate, ordering and performing treatments and interventions, ordering and review of radiographic studies, ordering and review of laboratory studies, pulse oximetry, re-evaluation of patient's condition and review of old charts   I assumed direction of critical care for this patient from another provider in my specialty: no     Care discussed with: admitting provider    Procedure Name: Intubation Date/Time: 11/12/2022 7:53 AM  Performed by: Rexford Maus, DOPre-anesthesia Checklist: Patient identified, Emergency Drugs available, Suction available and Patient being monitored Oxygen Delivery Method: Ambu bag Preoxygenation: Pre-oxygenation with 100% oxygen Induction Type: Rapid sequence Laryngoscope Size: Glidescope and 3 Grade View: Grade II Tube size: 7.5 mm Number of attempts: 1 Airway Equipment and Method: Rigid stylet and Video-laryngoscopy Placement Confirmation: ETT inserted through vocal cords under direct vision, Positive ETCO2, CO2 detector and Breath sounds checked- equal and bilateral Secured at: 23 cm Tube secured with: ETT holder Dental Injury: Teeth and Oropharynx as per pre-operative assessment         Medications Ordered in ED Medications  fentaNYL (SUBLIMAZE) injection 25 mcg (has no administration in time range)  fentaNYL in NS (2mcg/ml) infusion-PREMIX (50 mcg/hr Intravenous New Bag/Given 11/12/22 0752)  fentaNYL (SUBLIMAZE) bolus via infusion 25-100 mcg (has no administration in time range)  propofol (DIPRIVAN) 1000 MG/100ML infusion (10 mcg/kg/min  80 kg Intravenous New Bag/Given 11/12/22 0753)  norepinephrine (LEVOPHED) 4mg  in (0.016 mg/mL) premix infusion (15 mcg/min Intravenous New Bag/Given 11/12/22 0751)    ED Course/ Medical Decision Making/ A&P Clinical Course as of 11/12/22 0848  Mon Nov 12, 2022  7829 Family in family room states they would like patient to be DNR. Spoke with critical care who will come see. Recommended cardiology consult and central line with pressor requirements.  [VK]  0800 Spoke with cardiology PA who will evaluate the patient at bedside. [VK]  0845 ICU evaluated at bedside. Downtrending pressor requirements, so does not need emergent central line.  [VK]    Clinical Course User Index [VK] Rexford Maus, DO                                 Medical Decision  Making This patient presents to the ED with chief complaint(s)  of post-code with pertinent past medical history of CHF, CAD, DM, CKD which further complicates the presenting complaint. The complaint involves an extensive differential diagnosis and also carries with it a high risk of complications and morbidity.    The differential diagnosis includes ACS, arrhythmia, hypoxia, electrolyte derangement, no signs of trauma on exam, no signs of tension pneumothorax  Additional history obtained: Additional history obtained from family and EMS  Records reviewed Primary Care Documents and cardiology records  ED Course and Reassessment: On patient's arrival I was immediately present at bedside, she had spontaneous circulation with a heart rate in the 70s, blood pressure in the 90s.  She was transition to her stretcher.  LMA was in place.  IV access was obtained.  She was placed on ZOLL monitor and on cardiac monitoring.  EKG was obtained that showed normal sinus rhythm with left bundle branch block without significant change from prior EKG.  Patient's LMA was exchanged for ET tube.  Independent labs interpretation:  The following labs were independently interpreted: lactic acidosis, improved metabolic acidosis, mild anemia from baseline, BNP improved from baseline otherwise pending  Independent visualization of imaging: - I independently visualized the following imaging with scope of interpretation limited to determining acute life threatening conditions related to emergency care: CXR, which revealed no acute disease, lines and tubes in appropriate position  Consultation: - Consulted or discussed management/test interpretation w/ external professional: critical care, cardiology  Consideration for admission or further workup: patient requires admission for further work up of her cardiac arrest Social Determinants of health: N/A    Amount and/or Complexity of Data Reviewed Labs: ordered. Radiology:  ordered.  Risk Prescription drug management. Decision regarding hospitalization.          Final Clinical Impression(s) / ED Diagnoses Final diagnoses:  Cardiac arrest [facility])    Rx / DC Orders ED Discharge Orders     None         Rexford Maus, DO 11/12/22 1610

## 2022-11-13 DIAGNOSIS — I255 Ischemic cardiomyopathy: Secondary | ICD-10-CM | POA: Diagnosis not present

## 2022-11-13 DIAGNOSIS — I469 Cardiac arrest, cause unspecified: Secondary | ICD-10-CM | POA: Diagnosis not present

## 2022-11-13 LAB — MAGNESIUM
Magnesium: 2.3 mg/dL (ref 1.7–2.4)
Magnesium: 2.2 mg/dL (ref 1.7–2.4)

## 2022-11-13 LAB — POCT I-STAT 7, (LYTES, BLD GAS, ICA,H+H)
Acid-base deficit: 4 mmol/L — ABNORMAL HIGH (ref 0.0–2.0)
Bicarbonate: 19.1 mmol/L — ABNORMAL LOW (ref 20.0–28.0)
Calcium, Ion: 1.14 mmol/L — ABNORMAL LOW (ref 1.15–1.40)
HCT: 33 % — ABNORMAL LOW (ref 36.0–46.0)
Hemoglobin: 11.2 g/dL — ABNORMAL LOW (ref 12.0–15.0)
O2 Saturation: 99 %
Patient temperature: 98.1
Potassium: 3.2 mmol/L — ABNORMAL LOW (ref 3.5–5.1)
Sodium: 140 mmol/L (ref 135–145)
TCO2: 20 mmol/L — ABNORMAL LOW (ref 22–32)
pCO2 arterial: 28.2 mm[Hg] — ABNORMAL LOW (ref 32–48)
pH, Arterial: 7.438 (ref 7.35–7.45)
pO2, Arterial: 112 mm[Hg] — ABNORMAL HIGH (ref 83–108)

## 2022-11-13 LAB — GLUCOSE, CAPILLARY
Glucose-Capillary: 102 mg/dL — ABNORMAL HIGH (ref 70–99)
Glucose-Capillary: 126 mg/dL — ABNORMAL HIGH (ref 70–99)
Glucose-Capillary: 138 mg/dL — ABNORMAL HIGH (ref 70–99)
Glucose-Capillary: 139 mg/dL — ABNORMAL HIGH (ref 70–99)
Glucose-Capillary: 161 mg/dL — ABNORMAL HIGH (ref 70–99)
Glucose-Capillary: 178 mg/dL — ABNORMAL HIGH (ref 70–99)

## 2022-11-13 LAB — BASIC METABOLIC PANEL
Anion gap: 18 — ABNORMAL HIGH (ref 5–15)
BUN: 43 mg/dL — ABNORMAL HIGH (ref 8–23)
CO2: 18 mmol/L — ABNORMAL LOW (ref 22–32)
Calcium: 9.4 mg/dL (ref 8.9–10.3)
Chloride: 105 mmol/L (ref 98–111)
Creatinine, Ser: 2.64 mg/dL — ABNORMAL HIGH (ref 0.44–1.00)
GFR, Estimated: 19 mL/min — ABNORMAL LOW (ref >60)
Glucose, Bld: 130 mg/dL — ABNORMAL HIGH (ref 70–99)
Potassium: 3.7 mmol/L (ref 3.5–5.1)
Sodium: 141 mmol/L (ref 135–145)

## 2022-11-13 LAB — PHOSPHORUS
Phosphorus: 4.5 mg/dL (ref 2.5–4.6)
Phosphorus: 4.4 mg/dL (ref 2.5–4.6)

## 2022-11-13 LAB — CBC
HCT: 34.4 % — ABNORMAL LOW (ref 36.0–46.0)
Hemoglobin: 11.2 g/dL — ABNORMAL LOW (ref 12.0–15.0)
MCH: 27.9 pg (ref 26.0–34.0)
MCHC: 32.6 g/dL (ref 30.0–36.0)
MCV: 85.6 fL (ref 80.0–100.0)
Platelets: 111 10*3/uL — ABNORMAL LOW (ref 150–400)
RBC: 4.02 MIL/uL (ref 3.87–5.11)
RDW: 16.1 % — ABNORMAL HIGH (ref 11.5–15.5)
WBC: 8.4 10*3/uL (ref 4.0–10.5)
nRBC: 0 % (ref 0.0–0.2)

## 2022-11-13 MED ORDER — OSMOLITE 1.5 CAL PO LIQD
1000.0000 mL | ORAL | Status: DC
  Administered 2022-11-13: 1000 mL

## 2022-11-13 MED ORDER — SODIUM CHLORIDE 0.9 % IV SOLN
3.0000 g | Freq: Two times a day (BID) | INTRAVENOUS | Status: AC
  Administered 2022-11-13 – 2022-11-17 (×10): 3 g via INTRAVENOUS
  Filled 2022-11-13 (×10): qty 8

## 2022-11-13 MED ORDER — ACETAMINOPHEN 325 MG PO TABS: 650.0000 mg | ORAL_TABLET | ORAL | Status: DC | PRN

## 2022-11-13 MED ORDER — PROSOURCE TF20 ENFIT COMPATIBL EN LIQD
60.0000 mL | Freq: Every day | ENTERAL | Status: DC
  Administered 2022-11-14: 60 mL
  Filled 2022-11-13: qty 60

## 2022-11-13 MED ORDER — ACETAMINOPHEN 160 MG/5ML PO SOLN
650.0000 mg | ORAL | Status: DC | PRN
  Administered 2022-11-13 – 2022-11-21 (×3): 650 mg
  Filled 2022-11-13 (×3): qty 20.3

## 2022-11-13 NOTE — Progress Notes (Signed)
Initial Nutrition Assessment  DOCUMENTATION CODES:   Not applicable  INTERVENTION:   - Plan to exchange OG tube for Cortrak tomorrow, 11/14/22  Initiate enteral nutrition via OG tube: - Start Osmolite 1.5 @ 20 ml/hr and advance rate by 10 ml every 4 hours to goal rate of 50 ml/hr (1200 ml/day) - PROSource TF20 60 ml daily  Tube feeding regimen at goal rate provides 1880 kcal, 95 grams of protein, and 914 ml of H2O.  NUTRITION DIAGNOSIS:   Inadequate oral intake related to inability to eat as evidenced by NPO status.  GOAL:   Patient will meet greater than or equal to 90% of their needs  MONITOR:   Labs, Vent status, Weight trends, TF tolerance  REASON FOR ASSESSMENT:   Ventilator, Consult Enteral/tube feeding initiation and management  ASSESSMENT:   72 year old female who presented to the ED on 10/14 after a cardiac arrest. PMH of CAD s/p CABG, CHF with reduced EF, CKD stage IV, T2DM, HTN, HLD.  Discussed pt with RN and during ICU rounds. Pt tolerating spontaneous breathing trial but mental status precludes extubation. Consult received for enteral nutrition initiation and management. Pt with OG tube in place. PCCM requesting Cortrak placement tomorrow; order in place.  No family present at time of RD visit. Unable to obtain diet and weight history at this time. Reviewed available weight history in chart. Noted weight has been trending up over the last 3 months.  Patient is currently intubated on ventilator support MV: 9.4 L/min Temp (24hrs), Avg:98.4 F (36.9 C), Min:96.3 F (35.7 C), Max:100.2 F (37.9 C)  Medications reviewed and include: colace, pepcid, SSI every 4 hours, miralax, IV abx  Labs reviewed: BUN 43, creatinine 2.64, ionized calcium 1.14, elevated LFTs, platelets 111 CBG's: 126-197 x 24 hours  UOP: 715 ml x 24 hours OGT: 175 ml x 24 hours  NUTRITION - FOCUSED PHYSICAL EXAM:  Flowsheet Row Most Recent Value  Orbital Region No depletion  Upper  Arm Region No depletion  Thoracic and Lumbar Region No depletion  Buccal Region Unable to assess  Temple Region Moderate depletion  Clavicle Bone Region No depletion  Clavicle and Acromion Bone Region Mild depletion  Scapular Bone Region Unable to assess  Dorsal Hand Mild depletion  Patellar Region No depletion  Anterior Thigh Region No depletion  Posterior Calf Region No depletion  Edema (RD Assessment) Mild  Hair Reviewed  Eyes Reviewed  Mouth Reviewed  Skin Reviewed  Nails Reviewed    Diet Order:   Diet Order             Diet NPO time specified  Diet effective now                   EDUCATION NEEDS:   No education needs have been identified at this time  Skin:  Skin Assessment: Reviewed RN Assessment  Last BM:  no documented BM  Height:   Ht Readings from Last 1 Encounters:  11/12/22 5\' 2"  (1.575 m)    Weight:   Wt Readings from Last 1 Encounters:  11/13/22 82 kg    Ideal Body Weight:  50 kg  BMI:  Body mass index is 33.06 kg/m.  Estimated Nutritional Needs:   Kcal:  1750-1950  Protein:  85-100 grams  Fluid:  1.7-1.9 L    Mertie Clause, MS, RD, LDN Registered Dietitian II Please see AMiON for contact information.

## 2022-11-13 NOTE — Progress Notes (Addendum)
NAME:  [name], MRN:  [id number], DOB:  03-12-1950, LOS: 1 ADMISSION DATE:  2022-11-24, CONSULTATION DATE: 11-24-2022 REFERRING MD:  Rexford Maus , CHIEF COMPLAINT: S/p cardiac arrest  History of Present Illness:  72 year old female with coronary artery disease status post CABG, chronic combined HFpEF/HFrEF, diabetes type 2, hypertension and hyperlipidemia who was brought into the emergency department status postcardiac arrest.  Per patient's family she was last seen around 4 AM, in the morning patient's family heard a thud, they went to see her, she was found unresponsive, EMS was called, bystander CPR was started initially patient was noted to be in PEA arrest but then converted to V-fib, she was defibrillated x 3.  Received 5 of epi and amiodarone after that ROSC was achieved.  Patient was brought in the emergency department she was intubated.  PCCM was consulted for help evaluation medical management  Pertinent  Medical History   Past Medical History:  Diagnosis Date   Acute exacerbation of CHF (congestive heart failure) (HCC) 09/12/2017   Acute gallstone pancreatitis    Acute gallstone pancreatitis    Acute on chronic systolic heart failure (HCC)    Bilateral pleural effusion 02/22/2014   Cardiomyopathy, ischemic, EF 25-30% 03/23/2014   03/01/2014- Date of procedure- CABG- X5- Severe 3-vessel coronary artery disease with severe LV dysfunction.  Ejection fraction 25-30%.  SURGICAL PROCEDURE:  Coronary artery bypass grafting x 5. SURGEON:  Sheliah Plane, MD.      Cardiomyopathy, ischemic, EF 25-30% 03/23/2014   03/01/2014- Date of procedure- CABG- X5- Severe 3-vessel coronary artery disease with severe LV dysfunction.  Ejection fraction 25-30%.  SURGICAL PROCEDURE:  Coronary artery bypass grafting x 5. SURGEON:  Sheliah Plane, MD.      Chronic systolic heart failure [facility])    Coronary artery disease involving native coronary artery of native heart without angina pectoris     Diabetes mellitus 1998   Dx in 1998. Microalbuminuria, never on insulin.   Diabetes mellitus type 2, controlled, without complications (HCC) 12/05/2005   Last A1C 6.7 (03/21/17)    On Diet Control Only Last Foot Exam: 03/21/17    Last eye exam: to be scheduled early 2019    Dyspnea    Essential hypertension 12/05/2005   Lisinopril 40mg  Daily, Coreg 25mg  BID.    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   Viral URI with cough 03/21/2017     Significant Hospital Events: Including procedures, antibiotic start and stop dates in addition to other pertinent events     Interim History / Subjective:  Afebrile Came off of vasopressor support. Tolerating spontaneous breathing trial but mental status precludes extubation  Objective   Blood pressure 138/85, pulse 86, temperature 100.2 F (37.9 C), resp. rate 20, height 5\' 2"  (1.575 m), weight 82 kg, SpO2 100%.    Vent Mode: PRVC FiO2 (%):  [40 %-60 %] 40 % Set Rate:  [22 bmp] 22 bmp Vt Set:  [400 mL] 400 mL PEEP:  [5 cmH20] 5 cmH20 Plateau Pressure:  [18 cmH20] 18 cmH20   Intake/Output Summary (Last 24 hours) at 11/13/2022 0804 Last data filed at 11/13/2022 0647 Gross per 24 hour  Intake 658.25 ml  Output 890 ml  Net -231.75 ml   Filed Weights   November 20, 2022 0700 11/13/22 0500  Weight: 80 kg 82 kg    Examination: General: Acute on chronically ill-appearing female, orally intubated HEENT: [place]/AT, eyes anicteric.  ETT and OGT in place Neuro: Eyes closed, does not open, not following commands, pupils pinpoint, minimally reactive to light, positive cough and gag Chest: Clear to auscultation, no crackles Heart: Regular rate and rhythm Abdomen: Soft,  nondistended, bowel sounds present Skin: No rash  Labs and images reviewed  Resolved Hospital Problem list     Assessment & Plan:  Status post PEA followed by V-fib cardiac arrest Acute respiratory failure with hypoxia Aspiration pneumonia Chronic combined HFrEF/HFpEF Coronary artery disease status post CABG Elevated troponin in the setting of CPR AKI on CKD stage IV Hypocalcemia Hyperlipidemia Diabetes type 2 Anemia of chronic disease Thrombocytopenia of critical illness Acute encephalopathy, postcardiac arrest  Continue normothermia protocol Continue aggressive electrolyte replacement Patient maintained in sinus rhythm Continue IV amiodarone Appreciate cardiology input, recommend if she recovers she may be considered for ICD Continue lung protective ventilation VAP prevention bundle in place Off sedation Tolerating spontaneous breathing trial but mental status precludes extubation Follow-up respiratory culture Started on IV Unasyn CT head was negative will obtain MRI brain tomorrow Repeat serum creatinine is pending Avoid nephrotoxic agent Follow-up CBC Echocardiogram in July 2024 showing EF less than 20% with diastolic dysfunction Patient's EKG showing left bundle branch block which is chronic Continue sliding scale insulin with CBG goal 140-180 A1c 7.9  Best Practice (right click and "Reselect all SmartList Selections" daily)   Diet/type: NPO DVT prophylaxis: LMWH GI prophylaxis: H2B Lines: N/A Foley:  Yes, and it is still needed Code Status:  DNR Last date of multidisciplinary goals of care discussion November 20, 2022: Patient's family was updated at bedside, decision was to continue full scope of care while keeping her DNR]  Labs   CBC: Recent Labs  Lab 11/20/22 0725 Nov 20, 2022 0731 2022/11/20 0838 11/13/22 0407  WBC 5.0  --   --   --   NEUTROABS 2.7  --   --   --   HGB 9.2* 9.9* 9.5* 11.2*  HCT 29.6* 29.0* 28.0* 33.0*  MCV 94.0  --   --   --   PLT 97*  --    --   --     Basic Metabolic Panel: Recent Labs  Lab 11/20/22 0725 20-Nov-2022 0731 Nov 20, 2022 0838 2022-11-20 1813 11/13/22 0407  NA 141 141 138  --  140  K 4.0 4.2 3.5  --  3.2*  CL 106  --   --   --   --   CO2 17*  --   --   --   --   GLUCOSE 136*  --   --   --   --   BUN 32*  --   --   --   --   CREATININE 2.41*  --   --   --   --   CALCIUM 8.5*  --   --   --   --   MG  --   --   --  2.2  --    GFR: Estimated Creatinine Clearance: 21 mL/min (A) (by C-G formula based on SCr of 2.41 mg/dL (H)). Recent Labs  Lab November 20, 2022 0725 11-20-2022 1526  WBC 5.0  --   LATICACIDVEN 6.4* 2.4*    Liver Function Tests: Recent Labs  Lab November 20, 2022 0725  AST 535*  ALT 443*  ALKPHOS 130*  BILITOT 1.2  PROT 5.9*  ALBUMIN 2.6*   Recent Labs  Lab 11/04/2022 0735  LIPASE 29   No results for input(s): "AMMONIA" in the last 168 hours.  ABG    Component Value Date/Time   PHART 7.438 11/13/2022 0407   PCO2ART 28.2 (L) 11/13/2022 0407   PO2ART 112 (H) 11/13/2022 0407   HCO3 19.1 (L) 11/13/2022 0407   TCO2 20 (L) 11/13/2022 0407   ACIDBASEDEF 4.0 (H) 11/13/2022 0407   O2SAT 99 11/13/2022 0407     Coagulation Profile: No results for input(s): "INR", "PROTIME" in the last 168 hours.  Cardiac Enzymes: No results for input(s): "CKTOTAL", "CKMB", "CKMBINDEX", "TROPONINI" in the last 168 hours.  HbA1C: Hemoglobin A1C  Date/Time Value Ref Range Status  01/10/2022 03:26 PM 11.1 (A) 4.0 - 5.6 % Final  06/29/2021 02:42 PM 8.5 (A) 4.0 - 5.6 % Final   Hgb A1c MFr Bld  Date/Time Value Ref Range Status  07/27/2022 12:39 AM 7.9 (H) 4.8 - 5.6 % Final    Comment:    (NOTE)         Prediabetes: 5.7 - 6.4         Diabetes: >6.4         Glycemic control for adults with diabetes: <7.0   09/17/2017 10:00 AM 6.7 (H) 4.8 - 5.6 % Final    Comment:    (NOTE) Pre diabetes:          5.7%-6.4% Diabetes:              >6.4% Glycemic control for   <7.0% adults with diabetes     CBG: Recent Labs   Lab 11/18/2022 1525 11/02/2022 1915 11/06/2022 2306 11/13/22 0308 11/13/22 0800  GLUCAP 197* 185* 183* 161* 139*    Critical care time:      The patient is critically ill due to status postcardiac arrest critical care was necessary to treat or prevent imminent or life-threatening deterioration.  Critical care was time spent personally by me on the following activities: development of treatment plan with patient and/or surrogate as well as nursing, discussions with consultants, evaluation of patient's response to treatment, examination of patient, obtaining history from patient or surrogate, ordering and performing treatments and interventions, ordering and review of laboratory studies, ordering and review of radiographic studies, pulse oximetry, re-evaluation of patient's condition and participation in multidisciplinary rounds.   During this encounter critical care time was devoted to patient care services described in this note for 39 minutes.     Cheri Fowler, MD [facility] See Amion for pager If no response to pager, please call [phone] until 7pm After 7pm, Please call E-link [phone]

## 2022-11-13 NOTE — Progress Notes (Signed)
eLink Physician-Brief Progress Note Patient Name: [name] DOB: May 29, 1950 MRN: [id number]   Date of Service  11/13/2022  HPI/Events of Note  Notified of vomited, holding tube feeding.  Have zofran to give. No desaturation  Also seems to be starting to wake up and is purposeful  eICU Interventions  Elink to be informed if restraints would become necessary     Intervention Category Intermediate Interventions: Other:  Darl Pikes 11/13/2022, 10:21 PM

## 2022-11-13 NOTE — TOC CM/SW Note (Signed)
Transition of Care [facility]) - Inpatient Brief Assessment   Patient Details  Name: [name] MRN: [id number] Date of Birth: [birth year]  Transition of Care [facility]) CM/SW Contact:    Tom-Johnson, Hershal Coria, RN Phone Number: 11/13/2022, 1:44 PM   Clinical Narrative:  Patient presented to the ED after being found unresponsive at home by family. Admitted with Cardiac Arrest, currently Intubated and Sedated.   Patient not Medically ready for discharge.  CM will continue to follow as patient progresses with care towards discharge.         Transition of Care Asessment: Insurance and Status: Insurance coverage has been reviewed Patient has primary care physician: Yes Home environment has been reviewed: UTA. Patient intubated Prior level of function:: UTA. Patient intubated Prior/Current Home Services:  (UTA. Patient intubated)   Readmission risk has been reviewed: Yes Transition of care needs: transition of care needs identified, TOC will continue to follow

## 2022-11-13 NOTE — Progress Notes (Signed)
Cardiologist:  Jens Som  Subjective:   Intubated/Sedated   Objective:  Vitals:   11/13/22 0600 11/13/22 0700 11/13/22 0801 11/13/22 0804  BP: 131/81 138/85  (!) 140/84  Pulse: 85 86    Resp: 20 20    Temp: 100.2 F (37.9 C) 100.2 F (37.9 C)    TempSrc: Bladder  Bladder   SpO2: 100% 100%    Weight:      Height:        Intake/Output from previous day:  Intake/Output Summary (Last 24 hours) at 11/13/2022 0846 Last data filed at 11/13/2022 1610 Gross per 24 hour  Intake 658.25 ml  Output 890 ml  Net -231.75 ml    Physical Exam:  Intubated/sedated Lungs clear anteriorly No murmur Abdomen benign Trace edema  Lab Results: Basic Metabolic Panel: Recent Labs    11/12/22 0725 11/12/22 0731 11/12/22 0838 11/12/22 1813 11/13/22 0407  NA 141   < > 138  --  140  K 4.0   < > 3.5  --  3.2*  CL 106  --   --   --   --   CO2 17*  --   --   --   --   GLUCOSE 136*  --   --   --   --   BUN 32*  --   --   --   --   CREATININE 2.41*  --   --   --   --   CALCIUM 8.5*  --   --   --   --   MG  --   --   --  2.2  --    < > = values in this interval not displayed.   Liver Function Tests: Recent Labs    11/12/22 0725  AST 535*  ALT 443*  ALKPHOS 130*  BILITOT 1.2  PROT 5.9*  ALBUMIN 2.6*   Recent Labs    11/12/22 0735  LIPASE 29   CBC: Recent Labs    11/12/22 0725 11/12/22 0731 11/12/22 0838 11/13/22 0407  WBC 5.0  --   --   --   NEUTROABS 2.7  --   --   --   HGB 9.2*   < > 9.5* 11.2*  HCT 29.6*   < > 28.0* 33.0*  MCV 94.0  --   --   --   PLT 97*  --   --   --    < > = values in this interval not displayed.    Imaging: EEG adult  Result Date: 11/12/2022 Charlsie Quest, MD     11/12/2022 11:25 AM Patient Name: [name] MRN: [id number] Epilepsy Attending: Charlsie Quest Referring Physician/Provider: Cheri Fowler, MD Date: 11/12/2022 Duration: 21.24 mins Patient history: 72 year old female status post cardiac arrest.  EEG to alert for  seizure. Level of alertness: comatose AEDs during EEG study: None Technical aspects: This EEG study was done with scalp electrodes positioned according to the 10-20 International system of electrode placement. Electrical activity was reviewed with band pass filter of 1-70Hz , sensitivity of 7 uV/mm, display speed of 65mm/sec with a 60Hz  notched filter applied as appropriate. EEG data were recorded continuously and digitally stored.  Video monitoring was available and reviewed as appropriate. Description: EEG showed continuous generalized background suppression, not reactive to stimulation.  Hyperventilation and photic stimulation were not performed.   ABNORMALITY -Background suppression, generalized IMPRESSION: This study is suggestive of profound diffuse encephalopathy. No seizures or epileptiform discharges were seen  throughout the recording. Dr.Chand  was notified. Charlsie Quest   CT Head Wo Contrast  Result Date: 11/12/2022 CLINICAL DATA:  72 year old female altered mental status, post cardiac arrest. PEA. EXAM: CT HEAD WITHOUT CONTRAST TECHNIQUE: Contiguous axial images were obtained from the base of the skull through the vertex without intravenous contrast. RADIATION DOSE REDUCTION: This exam was performed according to the departmental dose-optimization program which includes automated exposure control, adjustment of the mA and/or kV according to patient size and/or use of iterative reconstruction technique. COMPARISON:  None Available. 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 Smaller chronic left medial posterior cerebellar infarct. Linear area of chronic encephalomalacia at the anterior body of the corpus callosum, mild involvement of the right caudate. Maintained gray-white differentiation and sulci in both hemispheres. No midline shift, ventriculomegaly, mass effect, evidence of mass lesion, intracranial hemorrhage or evidence of cortically based acute infarction.  Vascular: Severe Calcified atherosclerosis at the skull base. No suspicious intracranial vascular hyperdensity. Skull: Intact.  No acute osseous abnormality identified. Sinuses/Orbits: Mild sinus mucosal thickening superimposed on left maxillary retention cysts. Tympanic cavities and mastoids remain clear. Other: Mildly Disconjugate gaze. Postoperative changes to the left globe. Some calcified scalp vessel atherosclerosis. Endotracheal and oral enteric tubes partially visible. Associated fluid in the pharynx. IMPRESSION: 1. No acute intracranial abnormality identified. No CT evidence of anoxic injury. 2. Chronic small and medium-sized vessel infarcts, including the right superior cerebellum. 3. Severe calcified atherosclerosis at the skull base. Electronically Signed   By: Odessa Fleming M.D.   On: 11/12/2022 09:26   DG Chest Portable 1 View  Result Date: 11/12/2022 CLINICAL DATA:  72 year old female with altered mental status, intubated. EXAM: PORTABLE CHEST 1 VIEW COMPARISON:  Chest radiograph 07/26/2022 and earlier. FINDINGS: Portable AP supine view at 0732 hours. Endotracheal tube tip in good position just below the level the clavicles. Enteric tube courses to the left upper quadrant, tip not included. Similar lung volumes to June, bilateral layering pleural effusions at that time appear resolved. There is mild residual streaky opacity in the left mid lung, stable and probably scarring or atelectasis. Otherwise when allowing for portable technique the lungs are clear. No pneumothorax. Prior CABG. Stable cardiomegaly. No acute osseous abnormality identified. Negative visible bowel gas. Stable cholecystectomy clips. IMPRESSION: 1. Endotracheal tube in good position and enteric tube continues into the left upper quadrant. 2. Chronic cardiomegaly and mild left mid lung scarring or atelectasis. Resolved pleural effusions seen in June. Electronically Signed   By: Odessa Fleming M.D.   On: 11/12/2022 08:44    Cardiac  Studies:  ECG:    Telemetry: NSR no VT or AV block   Echo: EF < 20% chronic 05/2022   Medications:    Chlorhexidine Gluconate Cloth  6 each Topical Q0600   docusate  100 mg Per Tube BID   enoxaparin (LOVENOX) injection  30 mg Subcutaneous Q24H   famotidine  10 mg Per Tube Daily   insulin aspart  0-15 Units Subcutaneous Q4H   polyethylene glycol  17 g Per Tube Daily      magnesium sulfate     norepinephrine (LEVOPHED) Adult infusion Stopped (11/12/22 1416)    Assessment/Plan:  PEA/V-fib arrest:  prolonged down time. EEG with severe diffuse encephalopathy no seizure focus ? CT head no acute findings  Now DNR wean to extubate and not re intubate Rhythm and hemodynamics stable off pressors   CAD s/p CABG in 2016: On aspirin and Lipitor at home, medication currently held  as patient is intubated.No evidence of acute ischemic event clinically Elevated troponin 2846 likely from prolonged CPR  ECG with chronic LBBB Likely arrhythmic from chronic cardiomyopathy Patient refused AICD in past    Ischemic cardiomyopathy with baseline EF less than 20%   Hypertension: stable no longer hypotensive    Hyperlipidemia: Plan to resume statin once she is extubated   DM2 insulin [place] q 4 hours    Elevated transaminases: Likely due to shock liver   CKD stage IV: Creatinine on arrival 2.4, slightly higher than baseline of 1.9-2.1.  Charlton Haws 11/13/2022, 8:46 AM

## 2022-11-13 NOTE — Progress Notes (Signed)
Pharmacy Antibiotic Note  [name] is a 72 y.o. female admitted on 11/12/2022 post cardiac arrest with concern for aspiration pneumonia.  Pharmacy has been consulted for Unasyn dosing.  WBC wnl, Tm 100.2, LA trending down. MRSA PCR negative.   Plan: Unasyn 3g IV every 12 hours.  Follow-up respiratory culture   Height: 5\' 2"  (157.5 cm) Weight: 82 kg (180 lb 12.4 oz) IBW/kg (Calculated) : 50.1  Temp (24hrs), Avg:97.7 F (36.5 C), Min:96.1 F (35.6 C), Max:100.2 F (37.9 C)  Recent Labs  Lab 11/12/22 0725 11/12/22 1526  WBC 5.0  --   CREATININE 2.41*  --   LATICACIDVEN 6.4* 2.4*    Estimated Creatinine Clearance: 21 mL/min (A) (by C-G formula based on SCr of 2.41 mg/dL (H)).    No Known Allergies  Antimicrobials this admission: Unasyn 10/15 >>  Dose adjustments this admission:   Microbiology results: 10/15 Sputum:   10/14 MRSA PCR: negative  Thank you for allowing pharmacy to be a part of this patient's care.  Link Snuffer, PharmD, BCPS, BCCCP Please refer to [facility] for [facility] numbers 11/13/2022 9:57 AM

## 2022-11-14 ENCOUNTER — Inpatient Hospital Stay (HOSPITAL_COMMUNITY): Payer: Medicare Other

## 2022-11-14 DIAGNOSIS — I469 Cardiac arrest, cause unspecified: Secondary | ICD-10-CM | POA: Diagnosis not present

## 2022-11-14 DIAGNOSIS — I255 Ischemic cardiomyopathy: Secondary | ICD-10-CM | POA: Diagnosis not present

## 2022-11-14 LAB — BASIC METABOLIC PANEL
Anion gap: 17 — ABNORMAL HIGH (ref 5–15)
Anion gap: 19 — ABNORMAL HIGH (ref 5–15)
BUN: 53 mg/dL — ABNORMAL HIGH (ref 8–23)
BUN: 57 mg/dL — ABNORMAL HIGH (ref 8–23)
CO2: 19 mmol/L — ABNORMAL LOW (ref 22–32)
CO2: 21 mmol/L — ABNORMAL LOW (ref 22–32)
Calcium: 9.1 mg/dL (ref 8.9–10.3)
Calcium: 9.3 mg/dL (ref 8.9–10.3)
Chloride: 108 mmol/L (ref 98–111)
Chloride: 107 mmol/L (ref 98–111)
Creatinine, Ser: 2.85 mg/dL — ABNORMAL HIGH (ref 0.44–1.00)
Creatinine, Ser: 2.93 mg/dL — ABNORMAL HIGH (ref 0.44–1.00)
GFR, Estimated: 17 mL/min — ABNORMAL LOW (ref >60)
GFR, Estimated: 16 mL/min — ABNORMAL LOW (ref >60)
Glucose, Bld: 146 mg/dL — ABNORMAL HIGH (ref 70–99)
Glucose, Bld: 163 mg/dL — ABNORMAL HIGH (ref 70–99)
Potassium: 3.4 mmol/L — ABNORMAL LOW (ref 3.5–5.1)
Potassium: 3.5 mmol/L (ref 3.5–5.1)
Sodium: 144 mmol/L (ref 135–145)
Sodium: 147 mmol/L — ABNORMAL HIGH (ref 135–145)

## 2022-11-14 LAB — CBC WITH DIFFERENTIAL/PLATELET
Abs Immature Granulocytes: 0.06 10*3/uL (ref 0.00–0.07)
Basophils Absolute: 0 10*3/uL (ref 0.0–0.1)
Basophils Relative: 0 %
Eosinophils Absolute: 0 10*3/uL (ref 0.0–0.5)
Eosinophils Relative: 0 %
HCT: 35.8 % — ABNORMAL LOW (ref 36.0–46.0)
Hemoglobin: 11.4 g/dL — ABNORMAL LOW (ref 12.0–15.0)
Immature Granulocytes: 1 %
Lymphocytes Relative: 8 %
Lymphs Abs: 0.8 10*3/uL (ref 0.7–4.0)
MCH: 28.4 pg (ref 26.0–34.0)
MCHC: 31.8 g/dL (ref 30.0–36.0)
MCV: 89.3 fL (ref 80.0–100.0)
Monocytes Absolute: 1.1 10*3/uL — ABNORMAL HIGH (ref 0.1–1.0)
Monocytes Relative: 11 %
Neutro Abs: 8.4 10*3/uL — ABNORMAL HIGH (ref 1.7–7.7)
Neutrophils Relative %: 80 %
Platelets: 103 10*3/uL — ABNORMAL LOW (ref 150–400)
RBC: 4.01 MIL/uL (ref 3.87–5.11)
RDW: 16.5 % — ABNORMAL HIGH (ref 11.5–15.5)
WBC: 10.4 10*3/uL (ref 4.0–10.5)
nRBC: 0 % (ref 0.0–0.2)

## 2022-11-14 LAB — GLUCOSE, CAPILLARY
Glucose-Capillary: 109 mg/dL — ABNORMAL HIGH (ref 70–99)
Glucose-Capillary: 112 mg/dL — ABNORMAL HIGH (ref 70–99)
Glucose-Capillary: 162 mg/dL — ABNORMAL HIGH (ref 70–99)
Glucose-Capillary: 160 mg/dL — ABNORMAL HIGH (ref 70–99)
Glucose-Capillary: 128 mg/dL — ABNORMAL HIGH (ref 70–99)
Glucose-Capillary: 141 mg/dL — ABNORMAL HIGH (ref 70–99)

## 2022-11-14 LAB — CBC
HCT: 34.4 % — ABNORMAL LOW (ref 36.0–46.0)
Hemoglobin: 11.1 g/dL — ABNORMAL LOW (ref 12.0–15.0)
MCH: 28.5 pg (ref 26.0–34.0)
MCHC: 32.3 g/dL (ref 30.0–36.0)
MCV: 88.4 fL (ref 80.0–100.0)
Platelets: 100 10*3/uL — ABNORMAL LOW (ref 150–400)
RBC: 3.89 MIL/uL (ref 3.87–5.11)
RDW: 16.6 % — ABNORMAL HIGH (ref 11.5–15.5)
WBC: 9.2 10*3/uL (ref 4.0–10.5)
nRBC: 0 % (ref 0.0–0.2)

## 2022-11-14 LAB — PHOSPHORUS
Phosphorus: 5.1 mg/dL — ABNORMAL HIGH (ref 2.5–4.6)
Phosphorus: 4.7 mg/dL — ABNORMAL HIGH (ref 2.5–4.6)

## 2022-11-14 LAB — MAGNESIUM
Magnesium: 2.5 mg/dL — ABNORMAL HIGH (ref 1.7–2.4)
Magnesium: 2.6 mg/dL — ABNORMAL HIGH (ref 1.7–2.4)

## 2022-11-14 MED ORDER — AMIODARONE HCL 200 MG PO TABS
200.0000 mg | ORAL_TABLET | Freq: Two times a day (BID) | ORAL | Status: DC
  Administered 2022-11-14 – 2022-11-21 (×15): 200 mg
  Filled 2022-11-14 (×16): qty 1

## 2022-11-14 MED ORDER — ORAL CARE MOUTH RINSE: 15.0000 mL | OROMUCOSAL | Status: DC | PRN

## 2022-11-14 MED ORDER — ORAL CARE MOUTH RINSE
15.0000 mL | OROMUCOSAL | Status: DC
  Administered 2022-11-14 – 2022-11-15 (×11): 15 mL via OROMUCOSAL

## 2022-11-14 MED ORDER — METOCLOPRAMIDE HCL 5 MG/ML IJ SOLN
5.0000 mg | Freq: Once | INTRAMUSCULAR | Status: AC
  Administered 2022-11-14: 5 mg via INTRAVENOUS
  Filled 2022-11-14: qty 2

## 2022-11-14 MED ORDER — DOCUSATE SODIUM 50 MG/5ML PO LIQD: 100.0000 mg | Freq: Two times a day (BID) | ORAL | Status: DC | PRN

## 2022-11-14 MED ORDER — LACTATED RINGERS IV BOLUS
500.0000 mL | Freq: Once | INTRAVENOUS | Status: AC
  Administered 2022-11-14: 500 mL via INTRAVENOUS

## 2022-11-14 NOTE — Progress Notes (Signed)
Afternooon rounds: pending MRI from this morning. Unpurposeful movements of her arms. Not on sedation. Renal function worsening, but given IVF bolus with some increased output. May need more IVF in the setting of recent shock. Vomiting earlier today, received Reglan. TF were held. Unclear why she is vomiting. Will monitor. She had cortrak that was replaced but appear coiled upward. This was pulled back. PRN zofran is in. Updated family at bedside and answered all questions. Goal is still to determine function s/p arrest.

## 2022-11-14 NOTE — Progress Notes (Signed)
Pt was taken to MRI and back to 73m02 without complications.

## 2022-11-14 NOTE — Progress Notes (Addendum)
NAME:  [name], MRN:  [id number], DOB:  28-Apr-1950, LOS: 2 ADMISSION DATE:  11/10/2022, CONSULTATION DATE: 11/08/2022 REFERRING MD:  Elayne Snare K , CHIEF COMPLAINT: S/p cardiac arrest  History of Present Illness:  72 year old female with past medical history of coronary artery disease status post CABG, chronic combined HFpEF/HFrEF, diabetes type 2, hypertension and hyperlipidemia who was brought into the emergency department status post cardiac arrest.  Per patient's family she was last seen around 4 AM. Patient's family states they heard a thud around 6:15am. They went to see her and she was found unresponsive. EMS was called and bystander CPR was started. Initially, patient was noted to be in PEA arrest but then converted to V-fib, she was defibrillated x 3.  Received 5 of epi and amiodarone after that ROSC was achieved.  Patient was brought in the emergency department she was intubated.  PCCM was consulted for help evaluation medical management. Labs in ED notable for Cr 2.41 (baseline ~2.12), AST 535, ALT 443, trop 542, BNP 1292, hgb 9.2. CXR with atelectasis.   Pertinent  Medical History   Past Medical History:  Diagnosis Date   Acute exacerbation of CHF (congestive heart failure) (HCC) 09/12/2017   Acute gallstone pancreatitis    Acute gallstone pancreatitis    Acute on chronic systolic heart failure (HCC)    Bilateral pleural effusion 02/22/2014   Cardiomyopathy, ischemic, EF 25-30% 03/23/2014   03/01/2014- Date of procedure- CABG- X5- Severe 3-vessel coronary artery disease with severe LV dysfunction.  Ejection fraction 25-30%.  SURGICAL PROCEDURE:  Coronary artery bypass grafting x 5. SURGEON:  Sheliah Plane, MD.      Cardiomyopathy, ischemic, EF 25-30% 03/23/2014   03/01/2014- Date of procedure- CABG- X5- Severe 3-vessel coronary artery disease with severe LV dysfunction.  Ejection fraction 25-30%.  SURGICAL PROCEDURE:  Coronary artery bypass grafting x 5. SURGEON:  Sheliah Plane, MD.      Chronic systolic heart failure [facility])    Coronary artery disease involving native coronary artery of native heart without angina pectoris    Diabetes mellitus 1998   Dx in 1998. Microalbuminuria, never on insulin.   Diabetes mellitus type 2, controlled, without complications (HCC) 12/05/2005   Last A1C 6.7 (03/21/17)    On Diet Control Only Last Foot Exam: 03/21/17    Last eye exam: to be scheduled early 2019    Dyspnea    Essential hypertension 12/05/2005   Lisinopril 40mg  Daily, Coreg 25mg  BID.    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   Viral URI with cough 03/21/2017     Significant Hospital Events: Including procedures, antibiotic start and stop dates in addition to other pertinent events   10/14: admitted to [facility] s/p cardiac arrest. EEG w/ encephalopathy - no seizures  10/15: of vasopressor support. SBT okay but mental status prohibitive  10/16: MRI today, renal function worsening. Off sedation but continues to have poor mental status.   Interim History / Subjective:  Unable to participate, as above   Objective   Blood pressure 104/68, pulse 69, temperature 98.8 F (37.1 C), resp. rate (!) 40, height 5\' 2"  (1.575 m), weight 82 kg,  SpO2 100%.    Vent Mode: PRVC FiO2 (%):  [40 %] 40 % Set Rate:  [22 bmp] 22 bmp Vt Set:  [400 mL] 400 mL PEEP:  [5 cmH20] 5 cmH20 Pressure Support:  [5 cmH20] 5 cmH20 Plateau Pressure:  [13 cmH20-15 cmH20] 15 cmH20   Intake/Output Summary (Last 24 hours) at 11/14/2022 0713 Last data filed at 11/14/2022 3295 Gross per 24 hour  Intake 306.67 ml  Output 920 ml  Net -613.33 ml   Filed Weights   11/02/2022 0700 11/13/22 0500  Weight: 80  kg 82 kg    Examination: General: ill appearing elderly female in no acute distress HEENT: right pupil 3mm, left 2mm, sluggish but reactive bilaterally. Intubated. OGT in place.  Neuro: does not follow commands. Withdraws to pain. Does not localize.  Cardiac: s1/s2 without murmur, rub, gallop Pulm: coarse sounds bilaterally. Intubated. Placed on SBT this AM.  Abdomen: soft, non-distended  Skin: No rash  Labs and images reviewed  Resolved Hospital Problem list   PEA arrest  Assessment & Plan:  Status post PEA followed by V-fib cardiac arrest Acute respiratory failure with hypoxia Aspiration pneumonia Acute encephalopathy, postcardiac arrest - CT head negative. MRI today  - off sedation, tolerating SBT but mental status prohibitive of extubation. Pending MRI  - f/u resp culture  - con't IV unasyn for likely aspiration event  - amiodarone 200 BID PO - appreciate cards input  - continue normothermia protocol, lung protective ventilation, VAP bundle   Chronic combined HFrEF/HFpEF Coronary artery disease status post CABG - appreciate cards recommendations  - may be ICD candidate if she recovers from critical illness  - Echocardiogram in July 2024 showing EF less than 20% with diastolic dysfunction  Elevated troponin; secondary to cardiac arrest, CPR - cardiology recs appreciated  - no intervention at this time   AKI on CKD stage IV; likely pre-renal in the setting of cardiac arrest. Baseline Cr 2.12  - trend bmp - Cr continues to rise. - 10/15 UOP (0.4cc/kg/hr) - avoid nephrotoxic agents   Hypocalcemia - ICU elyte replacement protocol   Hyperlipidemia - holding home atorvastatin   Diabetes type 2 - CBG q4h  - SSI coverage CBG goal 140-180 - A1c 7.9   Anemia of chronic disease - Trend CBC  - transfuse prn   Thrombocytopenia of critical illness - trend CBC  Best Practice (right click and "Reselect all SmartList Selections" daily)   Diet/type:  tubefeeds DVT prophylaxis: LMWH GI prophylaxis: H2B Lines: N/A Foley:  Yes, and it is still needed Code Status:  DNR Last date of multidisciplinary goals of care discussion [10/14: Patient's family was updated at bedside, decision was to continue full scope of care while keeping her DNR]  Labs   CBC: Recent Labs  Lab 10/30/2022 0725 11/29/2022 0731 11/22/2022 0838 11/13/22 0407 11/13/22 0823  WBC 5.0  --   --   --  8.4  NEUTROABS 2.7  --   --   --   --   HGB 9.2* 9.9* 9.5* 11.2* 11.2*  HCT 29.6* 29.0* 28.0* 33.0* 34.4*  MCV 94.0  --   --   --  85.6  PLT 97*  --   --   --  111*    Basic Metabolic Panel: Recent Labs  Lab 11/15/2022 0725 11/24/2022 0731 11/18/2022 0838 10/30/2022 1813 11/13/22 0407 11/13/22 0823 11/13/22 1714  NA 141 141 138  --  140 141  --   K 4.0 4.2 3.5  --  3.2* 3.7  --   CL 106  --   --   --   --  105  --   CO2 17*  --   --   --   --  18*  --   GLUCOSE 136*  --   --   --   --  130*  --   BUN 32*  --   --   --   --  43*  --   CREATININE 2.41*  --   --   --   --  2.64*  --   CALCIUM 8.5*  --   --   --   --  9.4  --   MG  --   --   --  2.2  --  2.3 2.2  PHOS  --   --   --   --   --  4.5 4.4   GFR: Estimated Creatinine Clearance: 19.1 mL/min (A) (by C-G formula based on SCr of 2.64 mg/dL (H)). Recent Labs  Lab 12-01-2022 0725 Dec 01, 2022 1526 11/13/22 0823  WBC 5.0  --  8.4  LATICACIDVEN 6.4* 2.4*  --     Liver Function Tests: Recent Labs  Lab December 01, 2022 0725  AST 535*  ALT 443*  ALKPHOS 130*  BILITOT 1.2  PROT 5.9*  ALBUMIN 2.6*   Recent Labs  Lab 01-Dec-2022 0735  LIPASE 29   No results for input(s): "AMMONIA" in the last 168 hours.  ABG    Component Value Date/Time   PHART 7.438 11/13/2022 0407   PCO2ART 28.2 (L) 11/13/2022 0407   PO2ART 112 (H) 11/13/2022 0407   HCO3 19.1 (L) 11/13/2022 0407   TCO2 20 (L) 11/13/2022 0407   ACIDBASEDEF 4.0 (H) 11/13/2022 0407   O2SAT 99 11/13/2022 0407     Coagulation Profile: No results for input(s):  "INR", "PROTIME" in the last 168 hours.  Cardiac Enzymes: No results for input(s): "CKTOTAL", "CKMB", "CKMBINDEX", "TROPONINI" in the last 168 hours.  HbA1C: Hemoglobin A1C  Date/Time Value Ref Range Status  01/10/2022 03:26 PM 11.1 (A) 4.0 - 5.6 % Final  06/29/2021 02:42 PM 8.5 (A) 4.0 - 5.6 % Final   Hgb A1c MFr Bld  Date/Time Value Ref Range Status  07/27/2022 12:39 AM 7.9 (H) 4.8 - 5.6 % Final    Comment:    (NOTE)         Prediabetes: 5.7 - 6.4         Diabetes: >6.4         Glycemic control for adults with diabetes: <7.0   09/17/2017 10:00 AM 6.7 (H) 4.8 - 5.6 % Final    Comment:    (NOTE) Pre diabetes:          5.7%-6.4% Diabetes:              >6.4% Glycemic control for   <7.0% adults with diabetes     CBG: Recent Labs  Lab 11/13/22 1206 11/13/22 1602 11/13/22 1929 11/13/22 2338 11/14/22 0347  GLUCAP 126* 102* 178* 138* 109*    Critical care time: 40    Karma Greaser [facility] 11/14/2022, 7:28 AM  Please see [email or web address] for pager details.  From 7A-7P if no response, please call [phone]. After hours, please call ELink [phone].

## 2022-11-14 NOTE — Procedures (Signed)
Cortrak  Person Inserting Tube:  Mahala Menghini, RD Tube Type:  Cortrak - 55 inches Tube Size:  10 Tube Location:  Left nare Secured by: Bridle Technique Used to Measure Tube Placement:  Marking at nare/corner of mouth Cortrak Secured At:  70 cm   Cortrak Tube Team Note:  Consult received to place a Cortrak feeding tube. Pt vomiting overnight on trickle tube feeds. OG tube removed and attempted post-pyloric Cortrak placement. Pt actively vomiting during placement; RN aware. Reglan given. Unable to advance Cortrak tube post-pyloric at this time. PCCM team made aware.  X-ray is required, abdominal x-ray has been ordered by the Cortrak team. Please confirm tube placement before using the Cortrak tube.   If the tube becomes dislodged please keep the tube and contact the Cortrak team at [email or web address] for replacement.  If after hours and replacement cannot be delayed, place a NG tube and confirm placement with an abdominal x-ray.    Mertie Clause, MS, RD, LDN Registered Dietitian II Please see AMiON for contact information.

## 2022-11-14 NOTE — Progress Notes (Signed)
eLink Physician-Brief Progress Note Patient Name: KALIANNE FETTING DOB: [birth year] MRN: [id number]   Date of Service  11/14/2022  HPI/Events of Note  Received request for restraints Patient seen intubated and a risk for self harm by pulling lines and tubes  eICU Interventions  Bilateral soft wrist restraints ordered Bedside team to assess in am if restraints to be continued     Intervention Category Intermediate Interventions: Other:  Darl Pikes 11/14/2022, 12:06 AM

## 2022-11-14 NOTE — Progress Notes (Signed)
Nutrition Follow-up  DOCUMENTATION CODES:   Not applicable  INTERVENTION:  Prior to use of Cortrak tube, recommend confirming placement via repeat abdominal x-ray ordered after tube was adjusted (tube was pulled back some).  Once appropriate to resume tube feeds via Cortrak: -Resume Osmolite 1.5 at 20 mL/hour -If tolerates trickle rate, advance by 10 mL/hour every 8 hours to goal rate of 50 mL/hour (1200 mL goal daily volume) -Provide PROSource TF20 60 mL daily -Provides: 1880 kcal, 95 grams of protein, 914 mL H2O daily  NUTRITION DIAGNOSIS:   Inadequate oral intake related to inability to eat as evidenced by NPO status.  Ongoing - tube feeds currently being held.  GOAL:   Patient will meet greater than or equal to 90% of their needs  Not met at this time.  MONITOR:   Labs, Vent status, Weight trends, TF tolerance  REASON FOR ASSESSMENT:   Ventilator, Consult Enteral/tube feeding initiation and management  ASSESSMENT:   72 year old female who presented to the ED on 10/14 after a cardiac arrest. PMH of CAD s/p CABG, CHF with reduced EF, CKD stage IV, T2DM, HTN, HLD.  10/15: tube feeds initiated at 20 mL/hour with orders to advance by 10 mL/hour every 4 hours to goal, but held later in evening after emesis  Pt intubated at time of RD assessment. Not on any sedation. Per review of chart tube feeds were only running a few hours prior to emesis. Tube feeds remain off at this time. Per RN pt continues to have emesis even though no tube feeds infusing. Plan was attempt to place post-pyloric Cortrak today, but unable to advance post-pyloric. Plan per PA is to continue to hold tube feeds and adjust antiemetic regimen to control emesis. Noted pt had a BM today.  Admission wt 80 kg. Currently 82.1 kg.  Patient is currently intubated on ventilator support MV: 8.7 L/min Temp (24hrs), Avg:99.4 F (37.4 C), Min:98.4 F (36.9 C), Max:100.9 F (38.3 C)  Medications reviewed and  include: Colace 100 mg BID, famotidine, Novolog 0-15 units Q4hrs, Miralax, Unasyn Received LR 500 mL bolus today  Labs reviewed: CBG 109-178, Sodium 147, CO2 21, BUN 57, Creatinine 2.93  Enteral Access: 10 Fr. Cortrak tube placed 10/16; per initial abdominal x-ray 10/16 tube coiled in stomach with tip in gastric fundus Tube was then re-adjusted and another abdominal x-ray was ordered. Still pending read on that second abdominal x-ray.  UOP 705 mL (0.4 mL/kg/hr) in previous 24 hrs  I/O: -845.1 mL since admission  Diet Order:   Diet Order             Diet NPO time specified  Diet effective now                  EDUCATION NEEDS:   No education needs have been identified at this time  Skin:  Skin Assessment: Reviewed RN Assessment  Last BM:  no documented BM  Height:   Ht Readings from Last 1 Encounters:  11/12/22 5\' 2"  (1.575 m)   Weight:   Wt Readings from Last 1 Encounters:  11/14/22 82.1 kg   Ideal Body Weight:  50 kg  BMI:  Body mass index is 33.1 kg/m.  Estimated Nutritional Needs:   Kcal:  1750-1950  Protein:  85-100 grams  Fluid:  1.7-1.9 L  Kayren Holck Tollie Eth, MS, RD, LDN, CNSC Pager number available on Amion

## 2022-11-14 NOTE — Progress Notes (Signed)
Cortrak Tube Team Note:  Noted Cortrak tube coiled upward toward GE junction on abdominal x-ray. RD returned to room to pull Cortrak back some. Cortrak tube now secured at 62 cm. Additional x-ray obtained.  X-ray is required, abdominal x-ray has been ordered by the Cortrak team. Please confirm tube placement before using the Cortrak tube.   If the tube becomes dislodged please keep the tube and contact the Cortrak team at [email or web address] for replacement.  If after hours and replacement cannot be delayed, place a NG tube and confirm placement with an abdominal x-ray.    Mertie Clause, MS, RD, LDN Registered Dietitian II Please see [email or web address] for contact information.

## 2022-11-14 NOTE — Progress Notes (Signed)
Cardiologist:  Jens Som  Subjective:   Intubated no meaningful movement   Objective:  Vitals:   11/14/22 0400 11/14/22 0500 11/14/22 0600 11/14/22 0700  BP: 111/79 110/68 104/68 (!) 127/94  Pulse: 78 70 69 85  Resp: (!) 52 (!) 43 (!) 40 (!) 23  Temp: 99.1 F (37.3 C) 99.1 F (37.3 C) 98.8 F (37.1 C) 99 F (37.2 C)  TempSrc:      SpO2: 100% 100% 100% 100%  Weight:      Height:        Intake/Output from previous day:  Intake/Output Summary (Last 24 hours) at 11/14/2022 0839 Last data filed at 11/14/2022 4782 Gross per 24 hour  Intake 306.67 ml  Output 855 ml  Net -548.33 ml    Physical Exam:  Intubated/sedated Lungs clear anteriorly No murmur Abdomen benign Trace edema  Lab Results: Basic Metabolic Panel: Recent Labs    11/12/22 0725 11/12/22 0731 11/13/22 0407 11/13/22 0823 11/13/22 1714  NA 141   < > 140 141  --   K 4.0   < > 3.2* 3.7  --   CL 106  --   --  105  --   CO2 17*  --   --  18*  --   GLUCOSE 136*  --   --  130*  --   BUN 32*  --   --  43*  --   CREATININE 2.41*  --   --  2.64*  --   CALCIUM 8.5*  --   --  9.4  --   MG  --    < >  --  2.3 2.2  PHOS  --   --   --  4.5 4.4   < > = values in this interval not displayed.   Liver Function Tests: Recent Labs    11/12/22 0725  AST 535*  ALT 443*  ALKPHOS 130*  BILITOT 1.2  PROT 5.9*  ALBUMIN 2.6*   Recent Labs    11/12/22 0735  LIPASE 29   CBC: Recent Labs    11/12/22 0725 11/12/22 0731 11/13/22 0407 11/13/22 0823  WBC 5.0  --   --  8.4  NEUTROABS 2.7  --   --   --   HGB 9.2*   < > 11.2* 11.2*  HCT 29.6*   < > 33.0* 34.4*  MCV 94.0  --   --  85.6  PLT 97*  --   --  111*   < > = values in this interval not displayed.    Imaging: EEG adult  Result Date: 11/12/2022 Julia Quest, MD     11/12/2022 11:25 AM Patient Name: [name] MRN: [id number] Epilepsy Attending: Charlsie Silva Referring Physician/Provider: Cheri Fowler, MD Date: 11/12/2022  Duration: 21.24 mins Patient history: 72 year old female status post cardiac arrest.  EEG to alert for seizure. Level of alertness: comatose AEDs during EEG study: None Technical aspects: This EEG study was done with scalp electrodes positioned according to the 10-20 International system of electrode placement. Electrical activity was reviewed with band pass filter of 1-70Hz , sensitivity of 7 uV/mm, display speed of 76mm/sec with a 60Hz  notched filter applied as appropriate. EEG data were recorded continuously and digitally stored.  Video monitoring was available and reviewed as appropriate. Description: EEG showed continuous generalized background suppression, not reactive to stimulation.  Hyperventilation and photic stimulation were not performed.   ABNORMALITY -Background suppression, generalized IMPRESSION: This study is suggestive of profound diffuse encephalopathy.  No seizures or epileptiform discharges were seen throughout the recording. Dr.Chand  was notified. [name]   CT Head Wo Contrast  Result Date: 11/12/2022 CLINICAL DATA:  72 year old female altered mental status, post cardiac arrest. PEA. EXAM: CT HEAD WITHOUT CONTRAST TECHNIQUE: Contiguous axial images were obtained from the base of the skull through the vertex without intravenous contrast. RADIATION DOSE REDUCTION: This exam was performed according to the departmental dose-optimization program which includes automated exposure control, adjustment of the mA and/or kV according to patient size and/or use of iterative reconstruction technique. COMPARISON:  None Available. 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 Smaller chronic left medial posterior cerebellar infarct. Linear area of chronic encephalomalacia at the anterior body of the corpus callosum, mild involvement of the right caudate. Maintained gray-white differentiation and sulci in both hemispheres. No midline shift, ventriculomegaly, mass  effect, evidence of mass lesion, intracranial hemorrhage or evidence of cortically based acute infarction. Vascular: Severe Calcified atherosclerosis at the skull base. No suspicious intracranial vascular hyperdensity. Skull: Intact.  No acute osseous abnormality identified. Sinuses/Orbits: Mild sinus mucosal thickening superimposed on left maxillary retention cysts. Tympanic cavities and mastoids remain clear. Other: Mildly Disconjugate gaze. Postoperative changes to the left globe. Some calcified scalp vessel atherosclerosis. Endotracheal and oral enteric tubes partially visible. Associated fluid in the pharynx. IMPRESSION: 1. No acute intracranial abnormality identified. No CT evidence of anoxic injury. 2. Chronic small and medium-sized vessel infarcts, including the right superior cerebellum. 3. Severe calcified atherosclerosis at the skull base. Electronically Signed   By: Odessa Fleming M.D.   On: 11/12/2022 09:26    Cardiac Studies:  ECG:    Telemetry: NSR no VT or AV block   Echo: EF < 20% chronic 05/2022   Medications:    amiodarone  200 mg Per Tube BID   Chlorhexidine Gluconate Cloth  6 each Topical Q0600   docusate  100 mg Per Tube BID   enoxaparin (LOVENOX) injection  30 mg Subcutaneous Q24H   famotidine  10 mg Per Tube Daily   feeding supplement (PROSource TF20)  60 mL Per Tube Daily   insulin aspart  0-15 Units Subcutaneous Q4H   polyethylene glycol  17 g Per Tube Daily      ampicillin-sulbactam (UNASYN) IV 200 mL/hr at 11/14/22 0100   feeding supplement (OSMOLITE 1.5 CAL) Stopped (11/13/22 2105)   norepinephrine (LEVOPHED) Adult infusion Stopped (11/12/22 1416)    Assessment/Plan:  PEA/V-fib arrest:  prolonged down time. EEG with severe diffuse encephalopathy no seizure focus ? CT head no acute findings  Now DNR wean to extubate and not re intubate Rhythm and hemodynamics stable off pressors   CAD s/p CABG in 2016: On aspirin and Lipitor at home, medication currently held as  patient is intubated.No evidence of acute ischemic event clinically Elevated troponin 2846 likely from prolonged CPR  ECG with chronic LBBB Likely arrhythmic from chronic cardiomyopathy Patient refused AICD in past    Ischemic cardiomyopathy with baseline EF less than 20%   Hypertension: stable no longer hypotensive    Hyperlipidemia: Plan to resume statin once she is extubated   DM2 insulin [place] q 4 hours    Elevated transaminases: Likely due to shock liver   CKD stage IV: Creatinine on arrival 2.4, slightly higher than baseline of 1.9-2.1.  Waiting on MRI if expectation of neurologic recovery poor based on imaging ? Terminal wean  Charlton Haws 11/14/2022, 8:39 AM

## 2022-11-15 DIAGNOSIS — I469 Cardiac arrest, cause unspecified: Secondary | ICD-10-CM | POA: Diagnosis not present

## 2022-11-15 LAB — CBC
HCT: 32.8 % — ABNORMAL LOW (ref 36.0–46.0)
Hemoglobin: 10.8 g/dL — ABNORMAL LOW (ref 12.0–15.0)
MCH: 28.6 pg (ref 26.0–34.0)
MCHC: 32.9 g/dL (ref 30.0–36.0)
MCV: 87 fL (ref 80.0–100.0)
Platelets: 117 K/uL — ABNORMAL LOW (ref 150–400)
RBC: 3.77 MIL/uL — ABNORMAL LOW (ref 3.87–5.11)
RDW: 16.6 % — ABNORMAL HIGH (ref 11.5–15.5)
WBC: 11.4 K/uL — ABNORMAL HIGH (ref 4.0–10.5)
nRBC: 0 % (ref 0.0–0.2)

## 2022-11-15 LAB — GLUCOSE, CAPILLARY
Glucose-Capillary: 129 mg/dL — ABNORMAL HIGH (ref 70–99)
Glucose-Capillary: 124 mg/dL — ABNORMAL HIGH (ref 70–99)
Glucose-Capillary: 124 mg/dL — ABNORMAL HIGH (ref 70–99)
Glucose-Capillary: 164 mg/dL — ABNORMAL HIGH (ref 70–99)
Glucose-Capillary: 156 mg/dL — ABNORMAL HIGH (ref 70–99)
Glucose-Capillary: 155 mg/dL — ABNORMAL HIGH (ref 70–99)

## 2022-11-15 LAB — PHOSPHORUS: Phosphorus: 4.7 mg/dL — ABNORMAL HIGH (ref 2.5–4.6)

## 2022-11-15 LAB — BASIC METABOLIC PANEL
Anion gap: 16 — ABNORMAL HIGH (ref 5–15)
BUN: 58 mg/dL — ABNORMAL HIGH (ref 8–23)
CO2: 22 mmol/L (ref 22–32)
Calcium: 9 mg/dL (ref 8.9–10.3)
Chloride: 107 mmol/L (ref 98–111)
Creatinine, Ser: 2.91 mg/dL — ABNORMAL HIGH (ref 0.44–1.00)
GFR, Estimated: 17 mL/min — ABNORMAL LOW (ref >60)
Glucose, Bld: 144 mg/dL — ABNORMAL HIGH (ref 70–99)
Potassium: 3.5 mmol/L (ref 3.5–5.1)
Sodium: 145 mmol/L (ref 135–145)

## 2022-11-15 LAB — CULTURE, RESPIRATORY W GRAM STAIN

## 2022-11-15 LAB — MAGNESIUM: Magnesium: 2.6 mg/dL — ABNORMAL HIGH (ref 1.7–2.4)

## 2022-11-15 MED ORDER — PIVOT 1.5 CAL PO LIQD
1000.0000 mL | ORAL | Status: DC
  Administered 2022-11-15 – 2022-11-16 (×2): 1000 mL
  Filled 2022-11-15 (×2): qty 1000

## 2022-11-15 MED ORDER — CHLORHEXIDINE GLUCONATE CLOTH 2 % EX PADS
6.0000 | MEDICATED_PAD | CUTANEOUS | Status: DC
  Administered 2022-11-15 – 2022-11-22 (×8): 6 via TOPICAL

## 2022-11-15 MED ORDER — ORAL CARE MOUTH RINSE
15.0000 mL | OROMUCOSAL | Status: DC
  Administered 2022-11-15 – 2022-11-23 (×30): 15 mL via OROMUCOSAL

## 2022-11-15 MED ORDER — LACTATED RINGERS IV BOLUS
500.0000 mL | Freq: Once | INTRAVENOUS | Status: AC
  Administered 2022-11-15: 500 mL via INTRAVENOUS

## 2022-11-15 MED ORDER — ORAL CARE MOUTH RINSE: 15.0000 mL | OROMUCOSAL | Status: DC | PRN

## 2022-11-15 MED ORDER — POTASSIUM CHLORIDE 10 MEQ/100ML IV SOLN
10.0000 meq | INTRAVENOUS | Status: AC
  Administered 2022-11-15 (×2): 10 meq via INTRAVENOUS
  Filled 2022-11-15 (×2): qty 100

## 2022-11-15 NOTE — Plan of Care (Signed)
  Problem: Metabolic: Goal: Ability to maintain appropriate glucose levels will improve Outcome: Progressing   Problem: Nutritional: Goal: Maintenance of adequate nutrition will improve Outcome: Progressing   Problem: Clinical Measurements: Goal: Respiratory complications will improve Outcome: Progressing Goal: Cardiovascular complication will be avoided Outcome: Progressing   Problem: Pain Managment: Goal: General experience of comfort will improve Outcome: Progressing   Problem: Fluid Volume: Goal: Ability to maintain a balanced intake and output will improve Outcome: Not Progressing   Problem: Activity: Goal: Risk for activity intolerance will decrease Outcome: Not Progressing

## 2022-11-15 NOTE — Progress Notes (Signed)
eLink Physician-Brief Progress Note Patient Name: MAYLEY LISH DOB: Jan 14, 1951 MRN: [id number]   Date of Service  11/15/2022  HPI/Events of Note  K 3.5, Cr 2.9 Elevated po4/mag  eICU Interventions  Replacement ordered     Intervention Category Intermediate Interventions: Electrolyte abnormality - evaluation and management  Ranee Gosselin 11/15/2022, 3:32 AM

## 2022-11-15 NOTE — Progress Notes (Signed)
NAME:  [name], MRN:  [id number], DOB:  06-19-1950, LOS: 3 ADMISSION DATE:  14-Nov-2022, CONSULTATION DATE: Nov 14, 2022 REFERRING MD:  Elayne Snare K , CHIEF COMPLAINT: S/p cardiac arrest  History of Present Illness:  72 year old female with past medical history of coronary artery disease status post CABG, chronic combined HFpEF/HFrEF, diabetes type 2, hypertension and hyperlipidemia who was brought into the emergency department status post cardiac arrest.  Per patient's family she was last seen around 4 AM. Patient's family states they heard a thud around 6:15am. They went to see her and she was found unresponsive. EMS was called and bystander CPR was started. Initially, patient was noted to be in PEA arrest but then converted to V-fib, she was defibrillated x 3.  Received 5 of epi and amiodarone after that ROSC was achieved.  Patient was brought in the emergency department she was intubated.  PCCM was consulted for help evaluation medical management. Labs in ED notable for Cr 2.41 (baseline ~2.12), AST 535, ALT 443, trop 542, BNP 1292, hgb 9.2. CXR with atelectasis.   Pertinent  Medical History   Past Medical History:  Diagnosis Date   Acute exacerbation of CHF (congestive heart failure) (HCC) 09/12/2017   Acute gallstone pancreatitis    Acute gallstone pancreatitis    Acute on chronic systolic heart failure (HCC)    Bilateral pleural effusion 02/22/2014   Cardiomyopathy, ischemic, EF 25-30% 03/23/2014   03/01/2014- Date of procedure- CABG- X5- Severe 3-vessel coronary artery disease with severe LV dysfunction.  Ejection fraction 25-30%.  SURGICAL PROCEDURE:  Coronary artery bypass grafting x 5. SURGEON:  Sheliah Plane, MD.      Cardiomyopathy, ischemic, EF 25-30% 03/23/2014   03/01/2014- Date of procedure- CABG- X5- Severe 3-vessel coronary artery disease with severe LV dysfunction.  Ejection fraction 25-30%.  SURGICAL PROCEDURE:  Coronary artery bypass grafting x 5. SURGEON:  Sheliah Plane, MD.      Chronic systolic heart failure [facility])    Coronary artery disease involving native coronary artery of native heart without angina pectoris    Diabetes mellitus 1998   Dx in 1998. Microalbuminuria, never on insulin.   Diabetes mellitus type 2, controlled, without complications (HCC) 12/05/2005   Last A1C 6.7 (03/21/17)    On Diet Control Only Last Foot Exam: 03/21/17    Last eye exam: to be scheduled early 2019    Dyspnea    Essential hypertension 12/05/2005   Lisinopril 40mg  Daily, Coreg 25mg  BID.    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   Viral URI with cough 03/21/2017     Significant Hospital Events: Including procedures, antibiotic start and stop dates in addition to other pertinent events   2022/11/14: admitted to [facility] s/p cardiac arrest. EEG w/ encephalopathy - no seizures  10/15: of vasopressor support. SBT okay but mental status prohibitive  10/16: MRI today showing mild findings of ischemic injury involving cerebral cortex, renal function worsening. Off sedation but continues to have poor mental status.  10/17: improved mental status. Continue to wean to hopeful extubation tomorrow?  Interim History / Subjective:  Unable to participate, as above   Objective   Blood pressure  111/84, pulse 84, temperature 99.1 F (37.3 C), resp. rate 18, height 5\' 2"  (1.575 m), weight 82.1 kg, SpO2 100%.    Vent Mode: PRVC FiO2 (%):  [40 %] 40 % Set Rate:  [22 bmp] 22 bmp Vt Set:  [400 mL] 400 mL PEEP:  [5 cmH20] 5 cmH20 Pressure Support:  [8 cmH20] 8 cmH20 Plateau Pressure:  [13 cmH20-15 cmH20] 14 cmH20   Intake/Output Summary (Last 24 hours) at 11/15/2022 0740 Last data filed at  11/15/2022 0700 Gross per 24 hour  Intake 455.34 ml  Output 710 ml  Net -254.66 ml   Filed Weights   12-08-22 0700 11/13/22 0500 11/14/22 4034  Weight: 80 kg 82 kg 82.1 kg    Examination: General: ill appearing elderly female in no acute distress, opens eyes HEENT: right pupil 2mm, left 2mm, sluggish but reactive bilaterally. Intubated. cortrak in place.  Neuro: wakes to voice, intermittently follows commands Cardiac: s1/s2 without murmur, rub, gallop Pulm: coarse sounds bilaterally. Intubated. Placed on SBT this AM.  Abdomen: soft, non-distended  Skin: No rash  Labs and images reviewed  Resolved Hospital Problem list   PEA arrest Hypocalcemia  Assessment & Plan:  Status post PEA followed by V-fib cardiac arrest Acute respiratory failure with hypoxia Aspiration pneumonia Acute encephalopathy, postcardiac arrest - CT head negative. MRI 10/16 showing mild findings of ischemic injury  - off sedation, tolerating SBT. Mental status slowly improving - resp culture without growth thus far  - con't IV unasyn for likely aspiration event - on day 3 of 5 - amiodarone 200 BID PO - appreciate cards input  - continue normothermia protocol, lung protective ventilation, VAP bundle   Chronic combined HFrEF/HFpEF Coronary artery disease status post CABG - appreciate cards recommendations  - may be ICD candidate if she recovers from critical illness  - Echocardiogram in July 2024 showing EF less than 20% with diastolic dysfunction  Elevated troponin; secondary to cardiac arrest, CPR - cardiology recs appreciated  - no intervention at this time   AKI on CKD stage IV; likely pre-renal in the setting of cardiac arrest. Baseline Cr 2.12  - trend bmp - Cr plateau at 2.91  - Given 500 IVF bolus yesterday with improvement in UOP in the evening. Will redose . 10/16 UOP  - avoid nephrotoxic agents   Hyperlipidemia - holding home atorvastatin   Diabetes type 2 - CBG q4h  - SSI  coverage CBG goal 140-180 - A1c 7.9  - Adding TF back today - vomiting yesterday. Will start with trickle feeds.  Anemia of chronic disease - Trend CBC  - transfuse prn hgb <7  Thrombocytopenia of critical illness - trend CBC  Best Practice (right click and "Reselect all SmartList Selections" daily)   Diet/type: tubefeeds DVT prophylaxis: LMWH GI prophylaxis: H2B Lines: N/A Foley:  Yes, and it is still needed Code Status:  DNR Last date of multidisciplinary goals of care discussion 08-Dec-2022: Patient's family was updated at bedside, decision was to continue full scope of care while keeping her DNR]  Labs   CBC: Recent Labs  Lab Dec 08, 2022 0725 08-Dec-2022 0731 11/13/22 0407 11/13/22 0823 11/14/22 1022 11/14/22 1316 11/15/22 0250  WBC 5.0  --   --  8.4 9.2 10.4 11.4*  NEUTROABS 2.7  --   --   --   --  8.4*  --   HGB 9.2*   < > 11.2* 11.2* 11.1* 11.4* 10.8*  HCT 29.6*   < > 33.0* 34.4* 34.4* 35.8* 32.8*  MCV 94.0  --   --  85.6 88.4 89.3 87.0  PLT 97*  --   --  111* 100* 103* 117*   < > = values in this interval not displayed.    Basic Metabolic Panel: Recent Labs  Lab 12-03-22 0725 2022/12/03 0731 11/13/22 0407 11/13/22 0823 11/13/22 1714 11/14/22 1022 11/14/22 1316 11/14/22 1715 11/15/22 0250  NA 141   < > 140 141  --  144 147*  --  145  K 4.0   < > 3.2* 3.7  --  3.4* 3.5  --  3.5  CL 106  --   --  105  --  108 107  --  107  CO2 17*  --   --  18*  --  19* 21*  --  22  GLUCOSE 136*  --   --  130*  --  146* 163*  --  144*  BUN 32*  --   --  43*  --  53* 57*  --  58*  CREATININE 2.41*  --   --  2.64*  --  2.85* 2.93*  --  2.91*  CALCIUM 8.5*  --   --  9.4  --  9.1 9.3  --  9.0  MG  --    < >  --  2.3 2.2 2.5*  --  2.6* 2.6*  PHOS  --   --   --  4.5 4.4 5.1*  --  4.7* 4.7*   < > = values in this interval not displayed.   GFR: Estimated Creatinine Clearance: 17.4 mL/min (A) (by C-G formula based on SCr of 2.91 mg/dL (H)). Recent Labs  Lab 2022-12-03 0725  12/03/22 1526 11/13/22 0823 11/14/22 1022 11/14/22 1316 11/15/22 0250  WBC 5.0  --  8.4 9.2 10.4 11.4*  LATICACIDVEN 6.4* 2.4*  --   --   --   --     Liver Function Tests: Recent Labs  Lab 12-03-22 0725  AST 535*  ALT 443*  ALKPHOS 130*  BILITOT 1.2  PROT 5.9*  ALBUMIN 2.6*   Recent Labs  Lab 12-03-2022 0735  LIPASE 29   No results for input(s): "AMMONIA" in the last 168 hours.  ABG    Component Value Date/Time   PHART 7.438 11/13/2022 0407   PCO2ART 28.2 (L) 11/13/2022 0407   PO2ART 112 (H) 11/13/2022 0407   HCO3 19.1 (L) 11/13/2022 0407   TCO2 20 (L) 11/13/2022 0407   ACIDBASEDEF 4.0 (H) 11/13/2022 0407   O2SAT 99 11/13/2022 0407     Coagulation Profile: No results for input(s): "INR", "PROTIME" in the last 168 hours.  Cardiac Enzymes: No results for input(s): "CKTOTAL", "CKMB", "CKMBINDEX", "TROPONINI" in the last 168 hours.  HbA1C: Hemoglobin A1C  Date/Time Value Ref Range Status  01/10/2022 03:26 PM 11.1 (A) 4.0 - 5.6 % Final  06/29/2021 02:42 PM 8.5 (A) 4.0 - 5.6 % Final   Hgb A1c MFr Bld  Date/Time Value Ref Range Status  07/27/2022 12:39 AM 7.9 (H) 4.8 - 5.6 % Final    Comment:    (NOTE)         Prediabetes: 5.7 - 6.4         Diabetes: >6.4         Glycemic control for adults with diabetes: <7.0   09/17/2017 10:00 AM 6.7 (H) 4.8 - 5.6 % Final    Comment:    (NOTE) Pre diabetes:          5.7%-6.4% Diabetes:              >  6.4% Glycemic control for   <7.0% adults with diabetes     CBG: Recent Labs  Lab 11/14/22 1247 11/14/22 1544 11/14/22 1923 11/14/22 2305 11/15/22 0345  GLUCAP 162* 160* 128* 141* 129*    Critical care time: 40    Herold [facility] 11/15/2022, 7:40 AM  Please see [email or web address] for pager details.  From 7A-7P if no response, please call [phone]. After hours, please call ELink [phone].

## 2022-11-15 NOTE — IPAL (Cosign Needed)
  Interdisciplinary Goals of Care Family Meeting   Date carried out: 11/15/2022  Location of the meeting: Phone conference  Member's involved: Physician, Family Member or next of kin, and Other: PA  Durable Power of Attorney or acting medical decision maker: [name], patient daughter     Discussion: We discussed goals of care for [name] .  Daughter, POA was called to discuss advanced directive. Planning to extubate patient this afternoon as patient continues to pass SBT, mental status improving. We discussed patient is high risk to need reintubation. If extubation fails, she has opted to focus on patient comfort at that time and expresses patient would not want to be on ventilator long term. Dr. Merrily Pew was available for this discussion and also heard daughter express desire for comfort if extubation were to fail. Code status has been changed in the chart.   Code status:   Code Status: Do not attempt resuscitation (DNR) PRE-ARREST INTERVENTIONS DESIRED   Disposition: Continue current acute care - extubate today. Focus on comfort if she were to decline.   Time spent for the meeting: 15 minutes     Cristopher Peru, PA-C  11/15/2022, 1:31 PM

## 2022-11-15 NOTE — Progress Notes (Signed)
Penne Lash, patient's daughter & POA, called nurse expressing concerns over too many people visiting patient and getting information. Josselin informed RN that her son will be coming by to set up a password for visitors and information. Currently, patient's son Fayrene Fearing is at the bedside with wife & daughter, as well as patient's sister & brother-in-law. All family members informed of POA's request to stop visitors from seeing patient and receiving information. Family members have since left patient's room.

## 2022-11-15 NOTE — Progress Notes (Signed)
Patients Health Care Power Of Attorney voiced concerns regarding current visitors and requesting patient be changed to confidential patient status with set list of designated visitors.    Patient's Health Care POA [name]'s son [name] to bedside with his mother/patient's HCPOA on the phone [name] set up patient password, designated visitor list, and request to set patient as a confidential patient.

## 2022-11-15 NOTE — Procedures (Signed)
Extubation Procedure Note  Patient Details:   Name: CARRINA SCHOENBERGER DOB: 1950/04/22 MRN: [id number]   Airway Documentation:    Vent end date: 11/15/22 Vent end time: 1400   Evaluation  O2 sats: stable throughout Complications: No apparent complications Patient did tolerate procedure well. Bilateral Breath Sounds: Clear, Diminished (coarse)   No  RT extubated patient to 4L [place] per MD order with RN at bedside. Positive cuff leak noted. No stridor or distress noted at this time.  RT will continue to monitor as needed.   Jaquelyn Bitter 11/15/2022, 2:40 PM

## 2022-11-15 NOTE — Progress Notes (Signed)
RT NOTE: upon arrival to patient room, patient had already been placed on CPAP/PSV of 10/5.  Unsure of time patient was changed however patient is tolerating well at this time.  Will continue to monitor and wean as tolerated.

## 2022-11-16 DIAGNOSIS — I469 Cardiac arrest, cause unspecified: Secondary | ICD-10-CM | POA: Diagnosis not present

## 2022-11-16 LAB — BASIC METABOLIC PANEL
Anion gap: 11 (ref 5–15)
BUN: 66 mg/dL — ABNORMAL HIGH (ref 8–23)
CO2: 24 mmol/L (ref 22–32)
Calcium: 9.1 mg/dL (ref 8.9–10.3)
Chloride: 112 mmol/L — ABNORMAL HIGH (ref 98–111)
Creatinine, Ser: 3.18 mg/dL — ABNORMAL HIGH (ref 0.44–1.00)
GFR, Estimated: 15 mL/min — ABNORMAL LOW (ref >60)
Glucose, Bld: 150 mg/dL — ABNORMAL HIGH (ref 70–99)
Potassium: 3.2 mmol/L — ABNORMAL LOW (ref 3.5–5.1)
Sodium: 147 mmol/L — ABNORMAL HIGH (ref 135–145)

## 2022-11-16 LAB — GLUCOSE, CAPILLARY
Glucose-Capillary: 125 mg/dL — ABNORMAL HIGH (ref 70–99)
Glucose-Capillary: 140 mg/dL — ABNORMAL HIGH (ref 70–99)
Glucose-Capillary: 258 mg/dL — ABNORMAL HIGH (ref 70–99)
Glucose-Capillary: 224 mg/dL — ABNORMAL HIGH (ref 70–99)
Glucose-Capillary: 138 mg/dL — ABNORMAL HIGH (ref 70–99)
Glucose-Capillary: 114 mg/dL — ABNORMAL HIGH (ref 70–99)

## 2022-11-16 LAB — CBC
HCT: 31.5 % — ABNORMAL LOW (ref 36.0–46.0)
Hemoglobin: 10.2 g/dL — ABNORMAL LOW (ref 12.0–15.0)
MCH: 28.4 pg (ref 26.0–34.0)
MCHC: 32.4 g/dL (ref 30.0–36.0)
MCV: 87.7 fL (ref 80.0–100.0)
Platelets: 118 10*3/uL — ABNORMAL LOW (ref 150–400)
RBC: 3.59 MIL/uL — ABNORMAL LOW (ref 3.87–5.11)
RDW: 16.6 % — ABNORMAL HIGH (ref 11.5–15.5)
WBC: 9.4 10*3/uL (ref 4.0–10.5)
nRBC: 0.2 % (ref 0.0–0.2)

## 2022-11-16 MED ORDER — FUROSEMIDE 10 MG/ML IJ SOLN
40.0000 mg | Freq: Two times a day (BID) | INTRAMUSCULAR | Status: DC
  Administered 2022-11-16 – 2022-11-17 (×3): 40 mg via INTRAVENOUS
  Filled 2022-11-16 (×3): qty 4

## 2022-11-16 MED ORDER — PROSOURCE TF20 ENFIT COMPATIBL EN LIQD
60.0000 mL | Freq: Every day | ENTERAL | Status: DC
  Administered 2022-11-16 – 2022-11-19 (×4): 60 mL
  Filled 2022-11-16 (×4): qty 60

## 2022-11-16 MED ORDER — OSMOLITE 1.5 CAL PO LIQD
1000.0000 mL | ORAL | Status: DC
  Administered 2022-11-16 – 2022-11-17 (×2): 1000 mL
  Filled 2022-11-16 (×3): qty 1000

## 2022-11-16 MED ORDER — POTASSIUM CHLORIDE 20 MEQ PO PACK
60.0000 meq | PACK | Freq: Once | ORAL | Status: AC
  Administered 2022-11-16: 60 meq
  Filled 2022-11-16: qty 3

## 2022-11-16 NOTE — Plan of Care (Signed)
CHL Tonsillectomy/Adenoidectomy, Postoperative PEDS care plan entered in error.

## 2022-11-16 NOTE — Progress Notes (Signed)
PT Cancellation Note  Patient Details Name: [name] MRN: [id number] DOB: 06-Aug-1950   Cancelled Treatment:    Reason Eval/Treat Not Completed: Other (comment); RN reports pt not following commands, has noted some R UE movement, pt only groaning with rolling for communication.  Will follow up when able to bring +2 assistance for mobility.    Elray Mcgregor 11/16/2022, 4:16 PM Sheran Lawless, PT Acute Rehabilitation Services Office:[phone] 11/16/2022

## 2022-11-16 NOTE — Progress Notes (Signed)
eLink Physician-Brief Progress Note Patient Name: [name] DOB: 01/21/1951 MRN: [id number]   Date of Service  11/16/2022  HPI/Events of Note  K3.2 , 3.18  eICU Interventions  Kcl ordered     Intervention Category Minor Interventions: Electrolytes abnormality - evaluation and management  [name] 11/16/2022, 5:24 AM

## 2022-11-16 NOTE — Progress Notes (Signed)
NAME:  [name], MRN:  [id number], DOB:  1950-03-07, LOS: 4 ADMISSION DATE:  11/18/2022, CONSULTATION DATE: 11/24/2022 REFERRING MD:  Elayne Snare K , CHIEF COMPLAINT: S/p cardiac arrest  History of Present Illness:  72 year old female with past medical history of coronary artery disease status post CABG, chronic combined HFpEF/HFrEF, diabetes type 2, hypertension and hyperlipidemia who was brought into the emergency department status post cardiac arrest.  Per patient's family she was last seen around 4 AM. Patient's family states they heard a thud around 6:15am. They went to see her and she was found unresponsive. EMS was called and bystander CPR was started. Initially, patient was noted to be in PEA arrest but then converted to V-fib, she was defibrillated x 3.  Received 5 of epi and amiodarone after that ROSC was achieved.  Patient was brought in the emergency department she was intubated.  PCCM was consulted for help evaluation medical management. Labs in ED notable for Cr 2.41 (baseline ~2.12), AST 535, ALT 443, trop 542, BNP 1292, hgb 9.2. CXR with atelectasis.   Pertinent  Medical History   Past Medical History:  Diagnosis Date   Acute exacerbation of CHF (congestive heart failure) (HCC) 09/12/2017   Acute gallstone pancreatitis    Acute gallstone pancreatitis    Acute on chronic systolic heart failure (HCC)    Bilateral pleural effusion 02/22/2014   Cardiomyopathy, ischemic, EF 25-30% 03/23/2014   03/01/2014- Date of procedure- CABG- X5- Severe 3-vessel coronary artery disease with severe LV dysfunction.  Ejection fraction 25-30%.  SURGICAL PROCEDURE:  Coronary artery bypass grafting x 5. SURGEON:  Sheliah Plane, MD.      Cardiomyopathy, ischemic, EF 25-30% 03/23/2014   03/01/2014- Date of procedure- CABG- X5- Severe 3-vessel coronary artery disease with severe LV dysfunction.  Ejection fraction 25-30%.  SURGICAL PROCEDURE:  Coronary artery bypass grafting x 5. SURGEON:  Sheliah Plane, MD.      Chronic systolic heart failure [facility])    Coronary artery disease involving native coronary artery of native heart without angina pectoris    Diabetes mellitus 1998   Dx in 1998. Microalbuminuria, never on insulin.   Diabetes mellitus type 2, controlled, without complications (HCC) 12/05/2005   Last A1C 6.7 (03/21/17)    On Diet Control Only Last Foot Exam: 03/21/17    Last eye exam: to be scheduled early 2019    Dyspnea    Essential hypertension 12/05/2005   Lisinopril 40mg  Daily, Coreg 25mg  BID.    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   Viral URI with cough 03/21/2017     Significant Hospital Events: Including procedures, antibiotic start and stop dates in addition to other pertinent events   10/14: admitted to [facility] s/p cardiac arrest. EEG w/ encephalopathy - no seizures  10/15: of vasopressor support. SBT okay but mental status prohibitive  10/16: MRI today showing mild findings of ischemic injury involving cerebral cortex, renal function worsening. Off sedation but continues to have poor mental status.  10/17: extubated. Now DNR Limited.   Interim History / Subjective:  No acute events overnight. Patient nonverbal to questioning   Objective   Blood pressure 108/64, pulse  84, temperature 99.1 F (37.3 C), resp. rate (!) 29, height 5\' 2"  (1.575 m), weight 82.1 kg, SpO2 100%.    Vent Mode: CPAP;PSV FiO2 (%):  [40 %] 40 % PEEP:  [5 cmH20] 5 cmH20 Pressure Support:  [10 cmH20] 10 cmH20   Intake/Output Summary (Last 24 hours) at 11/16/2022 0659 Last data filed at 11/16/2022 0600 Gross per 24 hour  Intake 1295.45 ml  Output 390 ml  Net 905.45 ml   Filed Weights    11/20/2022 0700 11/13/22 0500 11/14/22 7253  Weight: 80 kg 82 kg 82.1 kg    Examination: General: ill appearing elderly female in no acute distress, opens eyes HEENT: right pupil 2mm, left 2mm, sluggish but reactive bilaterally. Cortrak in place. Mucous membranes moist Neuro: eyes open spontaneously. Does not follow commands. No verbal response.  Cardiac: s1/s2 without murmur, rub, gallop Pulm: coarse sounds bilaterally. No wheezing, rales. On 2LNC sat 100% Abdomen: soft, non-distended  Skin: No rash  Labs and images reviewed  Resolved Hospital Problem list   PEA arrest Hypocalcemia  Assessment & Plan:  Status post PEA followed by V-fib cardiac arrest Acute respiratory failure with hypoxia Aspiration pneumonia Acute encephalopathy, postcardiac arrest - CT head negative. MRI 10/16 showing mild findings of ischemic injury  - extubated 10/17 PM and DNR Limited  - O2 titrate to SpO2 >92% - con't amiodarone 200 BID for vfib arrest per cards - resp culture without growth thus far  - con't IV unasyn for likely aspiration event - on day 4 of 5 - appreciate cards input   Chronic combined HFrEF/HFpEF Coronary artery disease status post CABG - appreciate cards recommendations  - may be ICD candidate if she recovers from critical illness  - Echocardiogram in July 2024 showing EF less than 20% with diastolic dysfunction  Hypokalemia  - replete per elyte protocol  - trend BMP  Elevated troponin; secondary to cardiac arrest, CPR - cardiology recs appreciated  - no intervention at this time   AKI on CKD stage IV; likely pre-renal in the setting of cardiac arrest. Baseline Cr 2.12  - trend bmp - continues to increase  - Given 1L fluid challenge 10/16-17. With Cr increase likely not pre-renal volume issue.  - restarting Lasix 40mg  q12h - avoid nephrotoxic agents   Hyperlipidemia - holding home atorvastatin, will need swallow study   Diabetes type 2 - CBG q4h  - SSI coverage CBG  goal 140-180 - A1c 7.9   Anemia of chronic disease - Trend CBC  - transfuse prn hgb <7  Thrombocytopenia of critical illness - trend CBC  Goals of care: discussed code status with daughter 10/17 who was agreeable to extubation. The goal post-extubation will be to move toward comfort measures if she were to decompensate. She would not want patient re-intubated. DNR Limited order was placed.   Best Practice (right click and "Reselect all SmartList Selections" daily)   Diet/type: tubefeeds and NPO DVT prophylaxis: LMWH GI prophylaxis: H2B Lines: N/A Foley:  Yes, and it is still needed Code Status:  DNR Last date of multidisciplinary goals of care discussion [10/17: see IPAL note, discussed with daughter who agrees to make her DNR Limited. Extubated and will focus on comfort if she declines.]   Labs   CBC: Recent Labs  Lab 11/06/2022 0725 11/07/2022 0731 11/13/22 0823 11/14/22 1022 11/14/22 1316 11/15/22 0250 11/16/22 0354  WBC 5.0  --  8.4 9.2 10.4 11.4* 9.4  NEUTROABS 2.7  --   --   --  8.4*  --   --   HGB 9.2*   < > 11.2* 11.1* 11.4* 10.8* 10.2*  HCT 29.6*   < > 34.4* 34.4* 35.8* 32.8* 31.5*  MCV 94.0  --  85.6 88.4 89.3 87.0 87.7  PLT 97*  --  111* 100* 103* 117* 118*   < > = values in this interval not displayed.    Basic Metabolic Panel: Recent Labs  Lab 11/13/22 0823 11/13/22 1714 11/14/22 1022 11/14/22 1316 11/14/22 1715 11/15/22 0250 11/16/22 0354  NA 141  --  144 147*  --  145 147*  K 3.7  --  3.4* 3.5  --  3.5 3.2*  CL 105  --  108 107  --  107 112*  CO2 18*  --  19* 21*  --  22 24  GLUCOSE 130*  --  146* 163*  --  144* 150*  BUN 43*  --  53* 57*  --  58* 66*  CREATININE 2.64*  --  2.85* 2.93*  --  2.91* 3.18*  CALCIUM 9.4  --  9.1 9.3  --  9.0 9.1  MG 2.3 2.2 2.5*  --  2.6* 2.6*  --   PHOS 4.5 4.4 5.1*  --  4.7* 4.7*  --    GFR: Estimated Creatinine Clearance: 15.9 mL/min (A) (by C-G formula based on SCr of 3.18 mg/dL (H)). Recent Labs  Lab  11/24/2022 0725 11/10/2022 1526 11/13/22 0823 11/14/22 1022 11/14/22 1316 11/15/22 0250 11/16/22 0354  WBC 5.0  --    < > 9.2 10.4 11.4* 9.4  LATICACIDVEN 6.4* 2.4*  --   --   --   --   --    < > = values in this interval not displayed.    Liver Function Tests: Recent Labs  Lab 11/19/2022 0725  AST 535*  ALT 443*  ALKPHOS 130*  BILITOT 1.2  PROT 5.9*  ALBUMIN 2.6*   Recent Labs  Lab 10/31/2022 0735  LIPASE 29   No results for input(s): "AMMONIA" in the last 168 hours.  ABG    Component Value Date/Time   PHART 7.438 11/13/2022 0407   PCO2ART 28.2 (L) 11/13/2022 0407   PO2ART 112 (H) 11/13/2022 0407   HCO3 19.1 (L) 11/13/2022 0407   TCO2 20 (L) 11/13/2022 0407   ACIDBASEDEF 4.0 (H) 11/13/2022 0407   O2SAT 99 11/13/2022 0407     Coagulation Profile: No results for input(s): "INR", "PROTIME" in the last 168 hours.  Cardiac Enzymes: No results for input(s): "CKTOTAL", "CKMB", "CKMBINDEX", "TROPONINI" in the last 168 hours.  HbA1C: Hemoglobin A1C  Date/Time Value Ref Range Status  01/10/2022 03:26 PM 11.1 (A) 4.0 - 5.6 % Final  06/29/2021 02:42 PM 8.5 (A) 4.0 - 5.6 % Final   Hgb A1c MFr Bld  Date/Time Value Ref Range Status  07/27/2022 12:39 AM 7.9 (H) 4.8 - 5.6 % Final    Comment:    (NOTE)         Prediabetes: 5.7 - 6.4         Diabetes: >6.4         Glycemic control for adults with diabetes: <7.0   09/17/2017 10:00 AM 6.7 (H) 4.8 - 5.6 % Final    Comment:    (NOTE) Pre diabetes:          5.7%-6.4% Diabetes:              >6.4% Glycemic control for   <7.0% adults with diabetes  CBG: Recent Labs  Lab 11/15/22 1154 11/15/22 1514 11/15/22 1911 11/15/22 2320 11/16/22 0332  GLUCAP 124* 164* 156* 155* 125*    Critical care time: 32    Herold [facility]ms [facility] 11/16/2022, 6:59 AM  Please see [email or web address] for pager details.  From 7A-7P if no response, please call [phone]. After hours, please call ELink  [phone].

## 2022-11-16 NOTE — Plan of Care (Signed)

## 2022-11-16 NOTE — Progress Notes (Signed)
Nutrition Follow-up  DOCUMENTATION CODES:   Not applicable  INTERVENTION:   Continue tube feeds via Cortrak: - Change formula to Osmolite 1.5, begin at 30 ml/hr and advance rate by 10 ml every 8 hours to goal rate of 50 ml/hr (1200 ml/day) - PROSource TF20 60 ml daily - Recommend free water flushes of 100 ml every 4 hours  Tube feeding regimen at goal rate provides 1880 kcal, 95 grams of protein, and 914 ml of H2O.  Total free water with recommended free water flushes: 1514 ml  NUTRITION DIAGNOSIS:   Inadequate oral intake related to inability to eat as evidenced by NPO status.  Ongoing, being addressed via TF  GOAL:   Patient will meet greater than or equal to 90% of their needs  Progressing with advancement of TF to goal  MONITOR:   Labs, Vent status, Weight trends, TF tolerance  REASON FOR ASSESSMENT:   Ventilator, Consult Enteral/tube feeding initiation and management  ASSESSMENT:   72 year old female who presented to the ED on 10/14 after a cardiac arrest. PMH of CAD s/p CABG, CHF with reduced EF, CKD stage IV, T2DM, HTN, HLD.  10/15 - TF initiated at 20 ml/hr with orders to advance by 10 ml every 4 hours to goal, TF held later in evening after emesis 10/16 - post-pyloric Cortrak placement attempted but unsuccessful (left gastric), several episodes of emesis 10/17 - trickle TF resumed via Cortrak, extubated  Discussed pt with RN and during ICU rounds. No vomiting today per RN. Plan is to move toward comfort measures if pt were to decompensate. Tube feeds currently infusing via Cortrak at trickle rate. Consult received to advance tube feeding regimen to goal. Will adjust orders. Pt remains NPO.  Current TF: Pivot 1.5 @ 20 ml/hr  Admission weight: 80 kg Current weight: 72.1 kg  Medications reviewed and include: colace, pepcid, IV lasix 40 mg every 12 hours, SSI every 4 hours, miralax, klor-con 60 mEq x 1, IV abx  Labs reviewed: sodium 147, potassium 3.2,  chloride 112, BUN 66, creatinine 3.18, phosphorus 4.7 on 10/17, magnesium 2.6 on 10/17, platelets 118 CBG's: 124-164 x 24 hours  UOP: 390 ml x 24 hours I/O's: -329 ml since admit  Diet Order:   Diet Order             Diet NPO time specified  Diet effective now                   EDUCATION NEEDS:   No education needs have been identified at this time  Skin:  Skin Assessment: Reviewed RN Assessment  Last BM:  11/15/22 multiple type 6  Height:   Ht Readings from Last 1 Encounters:  11/12/22 5\' 2"  (1.575 m)    Weight:   Wt Readings from Last 1 Encounters:  11/16/22 72.1 kg    Ideal Body Weight:  50 kg  BMI:  Body mass index is 29.07 kg/m.  Estimated Nutritional Needs:   Kcal:  1750-1950  Protein:  85-100 grams  Fluid:  1.7-1.9 L    Mertie Clause, MS, RD, LDN Registered Dietitian II Please see AMiON for contact information.

## 2022-11-16 NOTE — Progress Notes (Signed)
Spoke with Dr. Sherrilee Gilles with [facility] who accepts patient for transfer to their service 10/19. Report given on patient

## 2022-11-17 LAB — TECHNOLOGIST SMEAR REVIEW: Plt Morphology: DECREASED

## 2022-11-17 LAB — CBC WITH DIFFERENTIAL/PLATELET
Abs Immature Granulocytes: 0.03 10*3/uL (ref 0.00–0.07)
Basophils Absolute: 0 10*3/uL (ref 0.0–0.1)
Basophils Relative: 0 %
Eosinophils Absolute: 0 10*3/uL (ref 0.0–0.5)
Eosinophils Relative: 0 %
HCT: 29.7 % — ABNORMAL LOW (ref 36.0–46.0)
Hemoglobin: 9.3 g/dL — ABNORMAL LOW (ref 12.0–15.0)
Immature Granulocytes: 0 %
Lymphocytes Relative: 20 %
Lymphs Abs: 1.7 10*3/uL (ref 0.7–4.0)
MCH: 27.4 pg (ref 26.0–34.0)
MCHC: 31.3 g/dL (ref 30.0–36.0)
MCV: 87.4 fL (ref 80.0–100.0)
Monocytes Absolute: 0.9 10*3/uL (ref 0.1–1.0)
Monocytes Relative: 10 %
Neutro Abs: 5.8 10*3/uL (ref 1.7–7.7)
Neutrophils Relative %: 70 %
Platelets: 120 10*3/uL — ABNORMAL LOW (ref 150–400)
RBC: 3.4 MIL/uL — ABNORMAL LOW (ref 3.87–5.11)
RDW: 17.1 % — ABNORMAL HIGH (ref 11.5–15.5)
WBC: 8.5 10*3/uL (ref 4.0–10.5)
nRBC: 0.7 % — ABNORMAL HIGH (ref 0.0–0.2)

## 2022-11-17 LAB — GLUCOSE, CAPILLARY
Glucose-Capillary: 155 mg/dL — ABNORMAL HIGH (ref 70–99)
Glucose-Capillary: 163 mg/dL — ABNORMAL HIGH (ref 70–99)
Glucose-Capillary: 167 mg/dL — ABNORMAL HIGH (ref 70–99)
Glucose-Capillary: 197 mg/dL — ABNORMAL HIGH (ref 70–99)
Glucose-Capillary: 201 mg/dL — ABNORMAL HIGH (ref 70–99)
Glucose-Capillary: 259 mg/dL — ABNORMAL HIGH (ref 70–99)
Glucose-Capillary: 270 mg/dL — ABNORMAL HIGH (ref 70–99)

## 2022-11-17 LAB — BASIC METABOLIC PANEL
Anion gap: 10 (ref 5–15)
BUN: 87 mg/dL — ABNORMAL HIGH (ref 8–23)
CO2: 22 mmol/L (ref 22–32)
Calcium: 8.7 mg/dL — ABNORMAL LOW (ref 8.9–10.3)
Chloride: 116 mmol/L — ABNORMAL HIGH (ref 98–111)
Creatinine, Ser: 3.82 mg/dL — ABNORMAL HIGH (ref 0.44–1.00)
GFR, Estimated: 12 mL/min — ABNORMAL LOW (ref >60)
Glucose, Bld: 277 mg/dL — ABNORMAL HIGH (ref 70–99)
Potassium: 4.4 mmol/L (ref 3.5–5.1)
Sodium: 148 mmol/L — ABNORMAL HIGH (ref 135–145)

## 2022-11-17 LAB — HEPATIC FUNCTION PANEL
ALT: 133 U/L — ABNORMAL HIGH (ref 0–44)
AST: 103 U/L — ABNORMAL HIGH (ref 15–41)
Albumin: 2.6 g/dL — ABNORMAL LOW (ref 3.5–5.0)
Alkaline Phosphatase: 130 U/L — ABNORMAL HIGH (ref 38–126)
Bilirubin, Direct: <0.1 mg/dL (ref 0.0–0.2)
Total Bilirubin: 0.7 mg/dL (ref 0.3–1.2)
Total Protein: 7.2 g/dL (ref 6.5–8.1)

## 2022-11-17 LAB — MAGNESIUM: Magnesium: 2.9 mg/dL — ABNORMAL HIGH (ref 1.7–2.4)

## 2022-11-17 LAB — IRON AND TIBC
Iron: 55 ug/dL (ref 28–170)
Saturation Ratios: 22 % (ref 10.4–31.8)
TIBC: 248 ug/dL — ABNORMAL LOW (ref 250–450)
UIBC: 193 ug/dL

## 2022-11-17 LAB — FERRITIN: Ferritin: 886 ng/mL — ABNORMAL HIGH (ref 11–307)

## 2022-11-17 MED ORDER — INSULIN ASPART 100 UNIT/ML IJ SOLN
0.0000 [IU] | INTRAMUSCULAR | Status: DC
  Administered 2022-11-17: 4 [IU] via SUBCUTANEOUS
  Administered 2022-11-17: 11 [IU] via SUBCUTANEOUS
  Administered 2022-11-17 – 2022-11-18 (×3): 4 [IU] via SUBCUTANEOUS
  Administered 2022-11-18: 3 [IU] via SUBCUTANEOUS
  Administered 2022-11-18: 7 [IU] via SUBCUTANEOUS
  Administered 2022-11-18: 11 [IU] via SUBCUTANEOUS
  Administered 2022-11-18: 7 [IU] via SUBCUTANEOUS
  Administered 2022-11-18: 3 [IU] via SUBCUTANEOUS
  Administered 2022-11-19: 4 [IU] via SUBCUTANEOUS
  Administered 2022-11-19: 7 [IU] via SUBCUTANEOUS
  Administered 2022-11-19 (×3): 4 [IU] via SUBCUTANEOUS
  Administered 2022-11-19: 3 [IU] via SUBCUTANEOUS

## 2022-11-17 MED ORDER — FREE WATER
200.0000 mL | Freq: Four times a day (QID) | Status: DC
  Administered 2022-11-17 – 2022-11-18 (×4): 200 mL

## 2022-11-17 MED ORDER — FREE WATER
200.0000 mL | Status: DC
Start: 2022-11-17 — End: 2022-11-17

## 2022-11-17 NOTE — Progress Notes (Signed)
Overview: [name] is a 72 y.o. with PMH of CAD s/p CABG, hx of HFrEF, T2DM, HTN, and HLD who was brought to the ED after a cardiac arrest. Pt's family heard a falling sound and found the patient unresponsive prompting them to call EMS. They started CPR in the meantime. Initial rhythm was PEA arrest bu that changed to vfib and she was shocked x3. She received 5 rounds of epi and amiodarone and ROSC was achieved. She was intubated and admitted to the ICU. She stayed in the ICU from 10/14-10/18 and was stable to transfer out. In the ICU she was treated for aspiration pneumonia with IV unasyn. She also had signs of liver and renal shock which were managed conservatively.   Overnight:NAEON  Subjective:  Unable to answer any questions.   Objective: Febrile at 100.4 yesterday but afebrile since then, normotensive and satting well on RA.   Vital signs in last 24 hours: Vitals:   11/16/22 1900 11/16/22 2000 11/16/22 2146 11/17/22 0442  BP: 120/81 123/83 118/88 103/74  Pulse: 91 92 96 87  Resp: 19 (!) 25 18 18   Temp: 99.7 F (37.6 C) 99.7 F (37.6 C) 98.1 F (36.7 C) 98.2 F (36.8 C)  TempSrc:   Oral Oral  SpO2: 98% 98% 100% 100%  Weight:      Height:       Supplemental O2: Room Air Last BM Date : 11/15/22 SpO2: 100 % O2 Flow Rate (L/min): 3 L/min FiO2 (%): 40 % Filed Weights   11/13/22 0500 11/14/22 0702 11/16/22 0704  Weight: 82 kg 82.1 kg 72.1 kg    Intake/Output Summary (Last 24 hours) at 11/17/2022 1308 Last data filed at 11/16/2022 2000 Gross per 24 hour  Intake 966 ml  Output 50 ml  Net 916 ml   Net IO Since Admission: 586.38 mL [11/17/22 0709]  Physical Exam General: Laying in bed HENT: head turned to the left side, NG tube present Lungs:  bibasilar rales present, no wheezing heard, auscultation limited to anterior lungs Cardiovascular: regular rate and rhythm, good radial pulse Abdomen: No TTP of abdomen, bowel sounds present MSK: no asymmetry  noted Skin: no lesions on skin Neuro: not responding to voice or sternal rub, no grimacing to pain.    Diagnostics    Latest Ref Rng & Units 11/17/2022    7:50 AM 11/16/2022    3:54 AM 11/15/2022    2:50 AM  CBC  WBC 4.0 - 10.5 K/uL 8.5  9.4  11.4   Hemoglobin 12.0 - 15.0 g/dL 9.3  65.7  84.6   Hematocrit 36.0 - 46.0 % 29.7  31.5  32.8   Platelets 150 - 400 K/uL 120  118  117        Latest Ref Rng & Units 11/17/2022    7:50 AM 11/16/2022    3:54 AM 11/15/2022    2:50 AM  BMP  Glucose 70 - 99 mg/dL 962  952  841   BUN 8 - 23 mg/dL 87  66  58   Creatinine 0.44 - 1.00 mg/dL 3.24  4.01  0.27   Sodium 135 - 145 mmol/L 148  147  145   Potassium 3.5 - 5.1 mmol/L 4.4  3.2  3.5   Chloride 98 - 111 mmol/L 116  112  107   CO2 22 - 32 mmol/L 22  24  22    Calcium 8.9 - 10.3 mg/dL 8.7  9.1  9.0      Assessment/Plan:  Principal Problem:   Cardiac arrest [facility]) Active Problems:   Ischemic cardiomyopathy  Acute encephalopathy 2/2 to 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 MRI showing consistent changes. Will consult neurology. Will need their input to guide GOC discussion with the family. Neurology consult placed.   Acute hypoxic respiratory failure 2/2 aspiration pneumonia On room air. Resolving to resolved. Day 5/5 of Unasyn.   AKI Worsened from 3.2 to 3.8 this am. Likely in setting of her cardiac arrest and exacerbated by her NPO status given her mentation. Will start FWF today which will help with hydration.   Elevated transaminases  Likely in setting of her cardiac arrest. Improving. Will trend for now. Repeat hepatic function panel pending.   HFrEF CAD with hx of CABG. Cardiology saw pt and recommended current measures. Pt's echo shows baseline low EF at 20 %. Will hold lasix for now given the AKI and add back once per tube. Will continue amiodarone.   Hypernatremia At 148  this am. Likely secondary to tube feeds. Will need FWF. Will start those today at at 200 cc/q6hrs. Deficit at 800 cc. Holding IV lasix as above.   T2DM Will continue tube feed coverage q4hrs. Will increase resistant given elevated readings >180.  Normocytic anemia Likely in the setting of acute illness. Will add on iron studies for her.   Thrombocytosis Improving. Will continue to monitor. Will get smear.   Diet: NPO IVF: None VTE: Enoxaparin Code: DNR PT/OT recs: Pending Prior to Admission Living Arrangement: Home Anticipated Discharge Location: Home Barriers to Discharge: Medical Stability Dispo: Anticipated discharge in approximately 5-5 day(s).   Gwenevere Abbot, MD Eligha Bridegroom. [facility] Internal Medicine Residency, PGY-3  Pager: [phone] After 5 pm and on weekends: Please call the on-call pager

## 2022-11-17 NOTE — Progress Notes (Signed)
Pharmacy Antibiotic Note  [name] is a 72 y.o. female admitted on 11/12/2022 post cardiac arrest with concern for aspiration pneumonia.  Pharmacy has been consulted for Unasyn dosing.  WBC wnl, Tm 100.2, LA trending down. MRSA PCR negative. Scr increased to 3.18 mg/dL, CrCl ~84 mL/min  Plan: Unasyn 3g IV every 12 hours x 5 days Monitor for clinical signs of improvement  Height: 5\' 2"  (157.5 cm) Weight: 72.1 kg (158 lb 15.2 oz) IBW/kg (Calculated) : 50.1  Temp (24hrs), Avg:99.6 F (37.6 C), Min:98.1 F (36.7 C), Max:100.4 F (38 C)  Recent Labs  Lab 11/12/22 0725 11/12/22 1526 11/13/22 0823 11/14/22 1022 11/14/22 1316 11/15/22 0250 11/16/22 0354  WBC 5.0  --  8.4 9.2 10.4 11.4* 9.4  CREATININE 2.41*  --  2.64* 2.85* 2.93* 2.91* 3.18*  LATICACIDVEN 6.4* 2.4*  --   --   --   --   --     Estimated Creatinine Clearance: 14.9 mL/min (A) (by C-G formula based on SCr of 3.18 mg/dL (H)).    No Known Allergies  Antimicrobials this admission: Unasyn 10/15 >> 10/19  Microbiology results: 10/15 Sputum:  rare GNRS, rare GPCs in pairs 10/14 MRSA PCR: negative  Thank you for allowing pharmacy to be a part of this patient's care.  Enos Fling, PharmD PGY-1 Acute Care Pharmacy Resident 11/17/2022 7:46 AM

## 2022-11-17 NOTE — Plan of Care (Signed)
  Problem: Nutritional: Goal: Maintenance of adequate nutrition will improve 11/17/2022 1825 by Barnet Pall, RN Outcome: Progressing 11/17/2022 1825 by Barnet Pall, RN Outcome: Not Progressing   Problem: Skin Integrity: Goal: Risk for impaired skin integrity will decrease 11/17/2022 1825 by Barnet Pall, RN Outcome: Progressing 11/17/2022 1825 by Barnet Pall, RN Outcome: Not Progressing   Problem: Nutrition: Goal: Adequate nutrition will be maintained 11/17/2022 1825 by Barnet Pall, RN Outcome: Progressing 11/17/2022 1825 by Barnet Pall, RN Outcome: Not Progressing

## 2022-11-17 NOTE — Evaluation (Signed)
Physical Therapy Evaluation Patient Details Name: [name] MRN: [id number] DOB: 08/23/1950 Today's Date: 11/17/2022  History of Present Illness  Patient is a 72 y/o female admitted 2022-11-29 after cardiac arrest, was intubated, extubated 10/17.  MRI 10/16 ischemic injury involving cerebral cortex.  PMH - CHF/ischemic cardiomyopathy EF 25-30%, DM, CAD, CKD, MI, CABG, HTN.  Clinical Impression  Patient presents with decreased mobility due to deficits listed in PT problem list.  Currently not following commands and only brief visual attention with max stimulation.  She tolerated EOB well, though no balance reactions noted today and very delayed response on R hand to hold washcloth and did not attempt to wipe her face while PT held her hand close to her face.  Feel she may benefit from skilled PT in the acute setting and will need inpatient rehab (<3 hours/day) at d/c.         If plan is discharge home, recommend the following: Two people to help with walking and/or transfers;Assist for transportation;Help with stairs or ramp for entrance;Two people to help with bathing/dressing/bathroom;Assistance with feeding;Assistance with cooking/housework   Can travel by private vehicle   No    Equipment Recommendations None recommended by PT  Recommendations for Other Services       Functional Status Assessment Patient has had a recent decline in their functional status and/or demonstrates limited ability to make significant improvements in function in a reasonable and predictable amount of time     Precautions / Restrictions Precautions Precautions: Fall Precaution Comments: coretrak      Mobility  Bed Mobility Overal bed mobility: Needs Assistance Bed Mobility: Rolling, Sidelying to Sit, Sit to Supine Rolling: +2 for physical assistance, Total assist Sidelying to sit: +2 for physical assistance, Total assist   Sit to supine: +2 for physical assistance, Total assist   General bed  mobility comments: assist for all aspects to dangle EOB    Transfers                        Ambulation/Gait                  Stairs            Wheelchair Mobility     Tilt Bed    Modified Rankin (Stroke Patients Only)       Balance Overall balance assessment: Needs assistance Sitting-balance support: No upper extremity supported Sitting balance-Leahy Scale: Zero Sitting balance - Comments: on EOB about 10 minutes working to get visual attention which was very delayed and with max cues and only briefly                                     Pertinent Vitals/Pain Pain Assessment Pain Assessment: Faces Faces Pain Scale: No hurt    Home Living Family/patient expects to be discharged to:: Private residence Living Arrangements: Children;Other relatives Available Help at Discharge: Family;Available PRN/intermittently Type of Home: Apartment Home Access: Level entry     Alternate Level Stairs-Number of Steps: flight Home Layout: Two level;Bed/bath upstairs Home Equipment: Tub bench Additional Comments: Patient nonverbal and no family present, information from prior visit 3 months ago    Prior Function Prior Level of Function : Independent/Modified Independent             Mobility Comments: used no AD       Extremity/Trunk Assessment   Upper Extremity  Assessment Upper Extremity Assessment: RUE deficits/detail;LUE deficits/detail RUE Deficits / Details: PROM WFL, some activation of grip noted when handing wash cloth; otherwise no activatin noted LUE Deficits / Details: PROM WFL, not following commands for moving arm and no spontaneous movements noted    Lower Extremity Assessment Lower Extremity Assessment: LLE deficits/detail;RLE deficits/detail RLE Deficits / Details: PROM  WFL, pt not moving to command or activating spontaneously LLE Deficits / Details: PROM WFL, pt not moving to command or activating spontaneously     Cervical / Trunk Assessment Cervical / Trunk Assessment: Other exceptions Cervical / Trunk Exceptions: prefers head turned to R and extended  Communication   Communication Communication: Difficulty communicating thoughts/reduced clarity of speech;Difficulty following commands/understanding Following commands:  (does not follow commands)  Cognition Arousal: Alert Behavior During Therapy: Flat affect Overall Cognitive Status: Impaired/Different from baseline                                 General Comments: Not following commands, limited brief visual attention with max stimulation; nonverbal        General Comments General comments (skin integrity, edema, etc.): noted dry cracked lips cleansed with cloth and applied mouth moisturzer    Exercises     Assessment/Plan    PT Assessment Patient needs continued PT services  PT Problem List Decreased strength;Decreased balance;Decreased cognition;Decreased mobility       PT Treatment Interventions Functional mobility training;Balance training;Patient/family education;Therapeutic activities;Therapeutic exercise    PT Goals (Current goals can be found in the Care Plan section)  Acute Rehab PT Goals Patient Stated Goal: unable to state PT Goal Formulation: Patient unable to participate in goal setting Time For Goal Achievement: 11/30/22 Potential to Achieve Goals: Fair    Frequency Min 1X/week     Co-evaluation               AM-PAC PT "6 Clicks" Mobility  Outcome Measure Help needed turning from your back to your side while in a flat bed without using bedrails?: Total Help needed moving from lying on your back to sitting on the side of a flat bed without using bedrails?: Total Help needed moving to and from a bed to a chair (including a wheelchair)?: Total Help needed standing up from a chair using your arms (e.g., wheelchair or bedside chair)?: Total Help needed to walk in hospital room?: Total Help  needed climbing 3-5 steps with a railing? : Total 6 Click Score: 6    End of Session   Activity Tolerance: Patient tolerated treatment well Patient left: in bed;with bed alarm set   PT Visit Diagnosis: Muscle weakness (generalized) (M62.81);Other symptoms and signs involving the nervous system (R29.898);Other abnormalities of gait and mobility (R26.89)    Time: 1045-1110 PT Time Calculation (min) (ACUTE ONLY): 25 min   Charges:   PT Evaluation $PT Eval Moderate Complexity: 1 Mod PT Treatments $Therapeutic Activity: 8-22 mins PT General Charges $$ ACUTE PT VISIT: 1 Visit         Sheran Lawless, PT Acute Rehabilitation Services Office:[phone] 11/17/2022   [name] 11/17/2022, 12:53 PM

## 2022-11-18 ENCOUNTER — Inpatient Hospital Stay (HOSPITAL_COMMUNITY): Payer: Medicare Other

## 2022-11-18 DIAGNOSIS — R569 Unspecified convulsions: Secondary | ICD-10-CM | POA: Diagnosis not present

## 2022-11-18 DIAGNOSIS — I469 Cardiac arrest, cause unspecified: Secondary | ICD-10-CM | POA: Diagnosis not present

## 2022-11-18 LAB — URINALYSIS, COMPLETE (UACMP) WITH MICROSCOPIC
Bilirubin Urine: NEGATIVE
Glucose, UA: 50 mg/dL — AB
Hgb urine dipstick: NEGATIVE
Ketones, ur: NEGATIVE mg/dL
Leukocytes,Ua: NEGATIVE
Nitrite: NEGATIVE
Protein, ur: NEGATIVE mg/dL
Specific Gravity, Urine: 1.018 (ref 1.005–1.030)
pH: 5 (ref 5.0–8.0)

## 2022-11-18 LAB — GLUCOSE, CAPILLARY
Glucose-Capillary: 252 mg/dL — ABNORMAL HIGH (ref 70–99)
Glucose-Capillary: 144 mg/dL — ABNORMAL HIGH (ref 70–99)
Glucose-Capillary: 125 mg/dL — ABNORMAL HIGH (ref 70–99)
Glucose-Capillary: 197 mg/dL — ABNORMAL HIGH (ref 70–99)
Glucose-Capillary: 201 mg/dL — ABNORMAL HIGH (ref 70–99)

## 2022-11-18 LAB — CBC
HCT: 29.5 % — ABNORMAL LOW (ref 36.0–46.0)
Hemoglobin: 9.5 g/dL — ABNORMAL LOW (ref 12.0–15.0)
MCH: 28 pg (ref 26.0–34.0)
MCHC: 32.2 g/dL (ref 30.0–36.0)
MCV: 87 fL (ref 80.0–100.0)
Platelets: 131 10*3/uL — ABNORMAL LOW (ref 150–400)
RBC: 3.39 MIL/uL — ABNORMAL LOW (ref 3.87–5.11)
RDW: 17.1 % — ABNORMAL HIGH (ref 11.5–15.5)
WBC: 9.6 10*3/uL (ref 4.0–10.5)
nRBC: 0.7 % — ABNORMAL HIGH (ref 0.0–0.2)

## 2022-11-18 LAB — BASIC METABOLIC PANEL
Anion gap: 13 (ref 5–15)
BUN: 107 mg/dL — ABNORMAL HIGH (ref 8–23)
CO2: 16 mmol/L — ABNORMAL LOW (ref 22–32)
Calcium: 8.3 mg/dL — ABNORMAL LOW (ref 8.9–10.3)
Chloride: 118 mmol/L — ABNORMAL HIGH (ref 98–111)
Creatinine, Ser: 3.86 mg/dL — ABNORMAL HIGH (ref 0.44–1.00)
GFR, Estimated: 12 mL/min — ABNORMAL LOW (ref >60)
Glucose, Bld: 217 mg/dL — ABNORMAL HIGH (ref 70–99)
Potassium: 6.5 mmol/L (ref 3.5–5.1)
Sodium: 147 mmol/L — ABNORMAL HIGH (ref 135–145)

## 2022-11-18 LAB — NA AND K (SODIUM & POTASSIUM), RAND UR
Potassium Urine: 49 mmol/L
Sodium, Ur: <10 mmol/L

## 2022-11-18 LAB — MAGNESIUM: Magnesium: 3 mg/dL — ABNORMAL HIGH (ref 1.7–2.4)

## 2022-11-18 LAB — POTASSIUM: Potassium: 4.3 mmol/L (ref 3.5–5.1)

## 2022-11-18 LAB — AMMONIA: Ammonia: 29 umol/L (ref 9–35)

## 2022-11-18 LAB — OSMOLALITY, URINE: Osmolality, Ur: 478 mosm/kg (ref 300–900)

## 2022-11-18 MED ORDER — ALBUTEROL SULFATE (2.5 MG/3ML) 0.083% IN NEBU
10.0000 mg | INHALATION_SOLUTION | Freq: Once | RESPIRATORY_TRACT | Status: AC
  Administered 2022-11-18: 10 mg via RESPIRATORY_TRACT
  Filled 2022-11-18: qty 12

## 2022-11-18 MED ORDER — CALCIUM GLUCONATE-NACL 1-0.675 GM/50ML-% IV SOLN
1.0000 g | Freq: Once | INTRAVENOUS | Status: AC
  Administered 2022-11-18: 1000 mg via INTRAVENOUS
  Filled 2022-11-18: qty 50

## 2022-11-18 MED ORDER — FREE WATER
200.0000 mL | Status: DC
  Administered 2022-11-18 – 2022-11-21 (×14): 200 mL

## 2022-11-18 MED ORDER — SODIUM ZIRCONIUM CYCLOSILICATE 10 G PO PACK: 10.0000 g | PACK | Freq: Once | ORAL | Status: DC

## 2022-11-18 MED ORDER — NEPRO/CARBSTEADY PO LIQD
1000.0000 mL | ORAL | Status: DC
  Administered 2022-11-18: 1000 mL

## 2022-11-18 MED ORDER — SODIUM ZIRCONIUM CYCLOSILICATE 10 G PO PACK
10.0000 g | PACK | Freq: Once | ORAL | Status: AC
  Administered 2022-11-18: 10 g
  Filled 2022-11-18: qty 1

## 2022-11-18 NOTE — Hospital Course (Addendum)
GWYNN HEIN was a 72 y.o. with PMH of CAD s/p CABG, hx of HFrEF, T2DM, HTN, and HLD who was brought to the ED after a cardiac arrest. Pt's family heard a falling sound and found the patient unresponsive prompting them to call EMS. They started CPR in the meantime. Initial rhythm was PEA arrest bu that changed to vfib and she was shocked x3. She received 5 rounds of epi and amiodarone and ROSC was achieved. She was intubated and admitted to the ICU. She stayed in the ICU from 10/14-10/18 and was stable to transfer out. In the ICU she was treated for aspiration pneumonia with IV unasyn. She also had signs of liver and renal shock which were managed conservatively.   Acute encephalopathy  AKI  Hepatic injury Status Post PEA arrest When she transferred from the ICU she was encephalopathic and non-responsive. Would have eyes open spontaneously, not localize to pain, not follow commands and was making unintelligible sounds. Neurology was consulted and recommended encephalopathy work up prior to attributing her mental status to anoxic brain injury given mild changes on MRI. Repeat MRI did not show signs of ischemic damage. EEG did not show signs of a post-ictal state. Thus, her encephalopathy was thought to be more consistent with a metabolic cause. She had renal and hepatic shock that worsened during the course of her hospitalization despite IV fluids and increased free water. In the past, patient has expressed to not want to pursue HD. Consulted nephrology, options were limited to adding Bicarb to her fluids which did no improvement in renal function the next day. This was further complicated by the patient pulling her feeding tube.  Her hospitalization was also complicated by N/V and diarrhea. SBO and acute intra-abdominal process was ruled out with CT. Her renal function continued to worsen as she was NPO. Family made the decision to not replace the tube and undergo conservative measures. Family not  interested in PEG tube. Palliative was consulted and family transitioned to comfort care. Patient underwent comfort care until the day of her death on Nov 30, 2022.   Hyperkalemia Pt's lab showing K 6.5. This is likely in setting of her worsening AKI. Calcium gluconate was ordered along with albuterol and lokelma. EKG was ordered that showed some peaked t waves. Discussions with family regarding pt's wishes show pt did not want short or long term HD. Her potassium then normalized to 4.4.   Acute hypoxic respiratory failure 2/2 aspiration pneumonia On room air after she transferred from the ICU. Resolved s/p 5 days of unasyn. She did not show any more signs of a respiratory infection.   HFrEF CAD with hx of CABG. She was on amiodarone and lasix. Pt's echo shows baseline low EF at 20 %. Lasix was held given AKI. Amiodarone was then discontinued when her feeding tube was discontinued.   Hypernatremia  Likely secondary to tube feeds. Minimal resolution with limited free water feeds.    T2DM Remained on SSI.

## 2022-11-18 NOTE — Progress Notes (Signed)
Critical lab values: Received critical lab values from [facility] / Lab tech at 0942 AM. K is 6.5 Made the provider aware Will continue to monitor

## 2022-11-18 NOTE — Progress Notes (Addendum)
Overview: [name] is a 72 y.o. with PMH of CAD s/p CABG, hx of HFrEF, T2DM, HTN, and HLD who was brought to the ED after a cardiac arrest. Pt's family heard a falling sound and found the patient unresponsive prompting them to call EMS. They started CPR in the meantime. Initial rhythm was PEA arrest bu that changed to vfib and she was shocked x3. She received 5 rounds of epi and amiodarone and ROSC was achieved. She was intubated and admitted to the ICU. She stayed in the ICU from 10/14-10/18 and was stable to transfer out. In the ICU she was treated for aspiration pneumonia with IV unasyn. She also had signs of liver and renal shock which were managed conservatively.   Overnight:NAEON  Subjective: not responding to voice, sternal rub or pain.   Objective: afebrile, normotensive and satting well on RA.   Vital signs in last 24 hours: Vitals:   11/17/22 1551 11/17/22 1949 11/17/22 2352 11/18/22 0408  BP: 119/80 134/88 127/76 (!) 136/91  Pulse: 89 88 90 88  Resp: 20 16  17   Temp: 97.6 F (36.4 C) 98.1 F (36.7 C) 97.7 F (36.5 C) 97.8 F (36.6 C)  TempSrc: Oral Oral Oral Oral  SpO2: 100% 100% 97% 99%  Weight:      Height:       Supplemental O2: Room Air Last BM Date : 11/17/22 Filed Weights   11/13/22 0500 11/14/22 0702 11/16/22 0704  Weight: 82 kg 82.1 kg 72.1 kg    Intake/Output Summary (Last 24 hours) at 11/18/2022 0601 Last data filed at 11/17/2022 1723 Gross per 24 hour  Intake --  Output 500 ml  Net -500 ml   Net IO Since Admission: 86.38 mL [11/18/22 0601]  Physical Exam  General: Laying in bed HENT: head turned to the left side, NG tube present Lungs:  bibasilar rales present, no wheezing heard, auscultation limited to anterior lungs Cardiovascular: regular rate and rhythm, good radial pulse Abdomen: No TTP of abdomen, bowel sounds present MSK: no asymmetry noted Skin: no lesions on skin Neuro: not responding to voice or sternal rub, no grimacing to  pain.    Diagnostics    Latest Ref Rng & Units 11/17/2022    7:50 AM 11/16/2022    3:54 AM 11/15/2022    2:50 AM  CBC  WBC 4.0 - 10.5 K/uL 8.5  9.4  11.4   Hemoglobin 12.0 - 15.0 g/dL 9.3  30.8  65.7   Hematocrit 36.0 - 46.0 % 29.7  31.5  32.8   Platelets 150 - 400 K/uL 120  118  117        Latest Ref Rng & Units 11/17/2022    7:50 AM 11/16/2022    3:54 AM 11/15/2022    2:50 AM  BMP  Glucose 70 - 99 mg/dL 846  962  952   BUN 8 - 23 mg/dL 87  66  58   Creatinine 0.44 - 1.00 mg/dL 8.41  3.24  4.01   Sodium 135 - 145 mmol/L 148  147  145   Potassium 3.5 - 5.1 mmol/L 4.4  3.2  3.5   Chloride 98 - 111 mmol/L 116  112  107   CO2 22 - 32 mmol/L 22  24  22    Calcium 8.9 - 10.3 mg/dL 8.7  9.1  9.0      Assessment/Plan: Principal Problem:   Cardiac arrest (HCC) Active Problems:   Ischemic cardiomyopathy  Acute encephalopathy 2/2 to  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 MRI showing changes concerning for ischemic injury. Neurology consulted and planning to work up further before attributing her symptoms to anoxic brain injury. Will need their input to guide GOC discussion with the family. Neurology consult placed.   Hyperkalemia Pt's lab showing K 6.5. This is likely in setting of her worsening AKI. Calcium gluconate was ordered along with albuterol and lokelma. EKG was ordered that showed some peaked t waves. Discussions with family regarding pt's wishes show pt did not want short or long term HD. Will hold off on consulting nephrology given pt's wishes. Repeat K pending. Will request tube feeds be changed to low K.   Acute hypoxic respiratory failure 2/2 aspiration pneumonia On room air. Resolved s/p 5 days of unasyn.   AKI Likely in setting of her cardiac arrest and exacerbated by her NPO status given her mentation. Will increase FWF to q2hrs today  which will help with hydration. Urine studies pending.  Elevated transaminases  Likely in setting of her cardiac arrest. Improving. Will trend for now. Repeat hepatic function panel shows improvement.   HFrEF CAD with hx of CABG. Cardiology saw pt and recommended current measures. Pt's echo shows baseline low EF at 20 %. Will hold lasix for now given the AKI and add back once per tube. Will continue amiodarone.   Hypernatremia At 147 this am. Likely secondary to tube feeds. Will need FWF. Increase today. Deficit at 800 cc. Holding IV lasix as above.   T2DM Will continue tube feed coverage q4hrs. Will increase resistant given elevated readings >180.  Normocytic anemia Likely in the setting of acute illness. Iron studies show anemia 2/2 to inflammation with elevated ferritin, decreased TIBC and normal Iron levels.   Thrombocytosis Improving. Will continue to monitor. Smear shows no platelet abnormality.   Diet: Tube feeds IVF: None VTE: Enoxaparin Code: DNR PT/OT recs: Pending Prior to Admission Living Arrangement: Home Anticipated Discharge Location: Home Barriers to Discharge: Medical Stability Dispo: Anticipated discharge in approximately 5 day(s).   Gwenevere Abbot, MD Eligha Bridegroom. [facility], PGY-3  Pager: [phone] After 5 pm and on weekends: Please call the on-call pager

## 2022-11-18 NOTE — Consult Note (Signed)
Neurology Consultation  Reason for Consult: s/p cardiac arrest - persistent encephalopathy  Referring Physician: Dr. Criselda Peaches   CC: cardiac arrest   History is obtained from:medical record   HPI: [name] is a 72 y.o. female with past medical history of CAD s/p CABG, HFrEF, DM, HTN, HLD, GERD who presented to hospital on 11/12/2022 after cardiac arrest. Family found her unresponsive and called EMS. Bystander CPR was initiated. EMS found patient in PEA arrest then V fib and was shocked x 3. She received 5 rounds of epi and amio with eventually ROSC. She was admitted tot he ICU and intubated from 10/14-10/18. She was treated for aspiration PNA. She also developed renal and liver shock.  MRI brain on 10/16 with mild findings of ischemic injury involving cerebral cortex. Routine EEG on 10/14  suggestive of profound diffuse encephalopathy.   She transferred to the floor on 10/18 and neurology consulted due to continued encephalopathy.  Labs this am; NA 147, K 6.5 (being treated by primary), CO2 16, BUN 107, Cr 3.86.  Neurology consulted   ROS:  Unable to obtain due to altered mental status.   Past Medical History:  Diagnosis Date   Acute exacerbation of CHF (congestive heart failure) (HCC) 09/12/2017   Acute gallstone pancreatitis    Acute gallstone pancreatitis    Acute on chronic systolic heart failure (HCC)    Bilateral pleural effusion 02/22/2014   Cardiomyopathy, ischemic, EF 25-30% 03/23/2014   03/01/2014- Date of procedure- CABG- X5- Severe 3-vessel coronary artery disease with severe LV dysfunction.  Ejection fraction 25-30%.  SURGICAL PROCEDURE:  Coronary artery bypass grafting x 5. SURGEON:  Sheliah Plane, MD.      Cardiomyopathy, ischemic, EF 25-30% 03/23/2014   03/01/2014- Date of procedure- CABG- X5- Severe 3-vessel coronary artery disease with severe LV dysfunction.  Ejection fraction 25-30%.  SURGICAL PROCEDURE:  Coronary artery bypass grafting x 5. SURGEON:  Sheliah Plane,  MD.      Chronic systolic heart failure [facility])    Coronary artery disease involving native coronary artery of native heart without angina pectoris    Diabetes mellitus 1998   Dx in 1998. Microalbuminuria, never on insulin.   Diabetes mellitus type 2, controlled, without complications (HCC) 12/05/2005   Last A1C 6.7 (03/21/17)    On Diet Control Only Last Foot Exam: 03/21/17    Last eye exam: to be scheduled early 2019    Dyspnea    Essential hypertension 12/05/2005   Lisinopril 40mg  Daily, Coreg 25mg  BID.    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   Viral URI with cough 03/21/2017     Family History  Problem Relation Age of Onset   Diabetes Mother        Mother died of MI at age 59   Heart failure Mother    Kidney failure Mother    Heart failure Sister        Died   Heart failure Brother        Died   Diabetes Brother        Died   Breast cancer Neg Hx      Social History:   reports  that she has never smoked. She has never used smokeless tobacco. She reports that she does not drink alcohol and does not use drugs.  Medications  Current Facility-Administered Medications:    acetaminophen (TYLENOL) 160 MG/5ML solution 650 mg, 650 mg, Per Tube, Q4H PRN, Cheri Fowler, MD, 650 mg at 11/16/22 1105   acetaminophen (TYLENOL) tablet 650 mg, 650 mg, Oral, Q4H PRN, 650 mg at 11/14/22 0913 **OR** [DISCONTINUED] acetaminophen (TYLENOL) 160 MG/5ML solution 650 mg, 650 mg, Per Tube, Q4H PRN **OR** acetaminophen (TYLENOL) suppository 650 mg, 650 mg, Rectal, Q4H PRN, Merrily Pew, Sud[place], MD   albuterol (PROVENTIL) (2.5 MG/3ML) 0.083% nebulizer solution 10 mg, 10 mg, Nebulization, Once, Gwenevere Abbot, MD    amiodarone (PACERONE) tablet 200 mg, 200 mg, Per Tube, BID, Cheri Fowler, MD, 200 mg at 11/18/22 0903   calcium gluconate 1 g/ 50 mL sodium chloride IVPB, 1 g, Intravenous, Once, Gwenevere Abbot, MD   Chlorhexidine Gluconate Cloth 2 % PADS 6 each, 6 each, Topical, Daily, Cheri Fowler, MD, 6 each at 11/18/22 0000   docusate (COLACE) 50 MG/5ML liquid 100 mg, 100 mg, Per Tube, BID, Cheri Fowler, MD, 100 mg at 11/17/22 2228   docusate (COLACE) 50 MG/5ML liquid 100 mg, 100 mg, Per Tube, BID PRN, Cheri Fowler, MD   enoxaparin (LOVENOX) injection 30 mg, 30 mg, Subcutaneous, Q24H, Chand, Sudham, MD, 30 mg at 11/17/22 1117   famotidine (PEPCID) tablet 10 mg, 10 mg, Per Tube, Daily, Cheri Fowler, MD, 10 mg at 11/18/22 0902   feeding supplement (OSMOLITE 1.5 CAL) liquid 1,000 mL, 1,000 mL, Per Tube, Continuous, Cheri Fowler, MD, Last Rate: 50 mL/hr at 11/17/22 1731, 1,000 mL at 11/17/22 1731   feeding supplement (PROSource TF20) liquid 60 mL, 60 mL, Per Tube, Daily, Cheri Fowler, MD, 60 mL at 11/18/22 0903   free water 200 mL, 200 mL, Per Tube, Q6H, Gwenevere Abbot, MD, 200 mL at 11/18/22 0600   insulin aspart (novoLOG) injection 0-20 Units, 0-20 Units, Subcutaneous, Q4H, Gwenevere Abbot, MD, 3 Units at 11/18/22 0857   ondansetron [facility]) injection 4 mg, 4 mg, Intravenous, Q6H PRN, Cheri Fowler, MD, 4 mg at 11/15/22 0017   Oral care mouth rinse, 15 mL, Mouth Rinse, 4 times per day, Cheri Fowler, MD, 15 mL at 11/18/22 0800   Oral care mouth rinse, 15 mL, Mouth Rinse, PRN, Cheri Fowler, MD   polyethylene glycol (MIRALAX / GLYCOLAX) packet 17 g, 17 g, Per Tube, Daily, Cheri Fowler, MD, 17 g at 11/17/22 0859   Exam: Current vital signs: BP (!) 141/99 (BP Location: Right Arm)   Pulse 91   Temp (!) 97.5 F (36.4 C) (Oral)   Resp 16   Ht 5\' 2"  (1.575 m)   Wt 73.9 kg   SpO2 99%   BMI 29.80 kg/m  Vital signs in last 24 hours: Temp:  [97.5 F (36.4 C)-98.2 F (36.8 C)] 97.5 F (36.4 C) (10/20  0828) Pulse Rate:  [88-96] 91 (10/20 0828) Resp:  [16-22] 16 (10/20 0828) BP: (119-141)/(76-99) 141/99 (10/20 0828) SpO2:  [97 %-100 %] 99 % (10/20 0828) Weight:  [73.9 kg] 73.9 kg (10/20 0500)  GENERAL: critically ill HEENT: - Normocephalic and atraumatic, dry mm LUNGS - Clear to auscultation bilaterally with no wheezes CV - S1S2 RRR, no m/r/g, equal pulses bilaterally. ABDOMEN - Soft, nontender, nondistended with normoactive BS Ext: warm, well perfused, intact peripheral pulses, no edema  NEURO:  Mental Status:  Eyes are open, does not follow commands, does not track, grimaces  to pain  Language: speech is mute   Cranial Nerves: PERRL EOM with right gaze preference, visual fields no blink to threat bilaterally, no facial asymmetry ,  facial sensation intact, hearing intact, tongue/uvula/soft palate midline, No evidence of tongue atrophy or fibrillations Motor: localizes to pain on left arm, right hemiplegia, left leg no movement  Tone: is normal and bulk is normal Sensation- grimaces to painful stimuli on right side and left leg, no movement of right hemibody or left leg Coordination: unable to obtain  Gait- deferred   Labs I have reviewed labs in epic and the results pertinent to this consultation are:  CBC    Component Value Date/Time   WBC 9.6 11/18/2022 0740   RBC 3.39 (L) 11/18/2022 0740   HGB 9.5 (L) 11/18/2022 0740   HGB 8.4 (L) 04/03/2018 1356   HCT 29.5 (L) 11/18/2022 0740   HCT 25.1 (L) 04/03/2018 1356   PLT 131 (L) 11/18/2022 0740   PLT 175 04/03/2018 1356   MCV 87.0 11/18/2022 0740   MCV 84 04/03/2018 1356   MCH 28.0 11/18/2022 0740   MCHC 32.2 11/18/2022 0740   RDW 17.1 (H) 11/18/2022 0740   RDW 16.0 (H) 04/03/2018 1356   LYMPHSABS 1.7 11/17/2022 0750   MONOABS 0.9 11/17/2022 0750   EOSABS 0.0 11/17/2022 0750   BASOSABS 0.0 11/17/2022 0750    CMP     Component Value Date/Time   NA 147 (H) 11/18/2022 0740   NA 140 08/07/2022 1138   K 6.5 (HH)  11/18/2022 0740   CL 118 (H) 11/18/2022 0740   CO2 16 (L) 11/18/2022 0740   GLUCOSE 217 (H) 11/18/2022 0740   BUN 107 (H) 11/18/2022 0740   BUN 36 (H) 08/07/2022 1138   CREATININE 3.86 (H) 11/18/2022 0740   CREATININE 2.40 (H) 03/26/2019 1049   CALCIUM 8.3 (L) 11/18/2022 0740   PROT 7.2 11/17/2022 0939   PROT 7.5 04/09/2022 1443   ALBUMIN 2.6 (L) 11/17/2022 0939   ALBUMIN 3.9 04/09/2022 1443   AST 103 (H) 11/17/2022 0939   ALT 133 (H) 11/17/2022 0939   ALKPHOS 130 (H) 11/17/2022 0939   BILITOT 0.7 11/17/2022 0939   BILITOT 0.4 04/09/2022 1443   GFRNONAA 12 (L) 11/18/2022 0740   GFRNONAA 20 (L) 03/26/2019 1049   GFRAA 32 (L) 09/24/2019 1330   GFRAA 23 (L) 03/26/2019 1049    Lipid Panel     Component Value Date/Time   CHOL 135 04/09/2022 1443   TRIG 105 04/09/2022 1443   HDL 51 04/09/2022 1443   CHOLHDL 2.6 04/09/2022 1443   CHOLHDL 2.7 03/26/2019 1049   VLDL 13 06/26/2016 1122   LDLCALC 65 04/09/2022 1443   LDLCALC 84 03/26/2019 1049    Lab Results  Component Value Date   HGBA1C 7.9 (H) 07/27/2022      Imaging I have reviewed the images obtained:  CT-head 10/14 no acute process  MRI examination of the brain 10/16 mild findings of ischemic injury involving cerebral cortex.  Assessment:  72 year old female admitted to the hospital post cardiac arrest on 10/14 and has remained encephalopathic since extubation on 10/18. She does have AKI and elevated transaminases   Currently, has a very poor exam, with not much of meaningful responses indicating higher brain function.  Given her extensive cerebrovascular risk history, I suspect that she has poor brain reserve and the anoxic event although the previous MRI did not show extensive anoxic injury, was enough to cause irreversible damage to her  brain. That said, it would be prudent to rule out any reversible causes of her altered mental status.  Impression: Evaluate for anoxic brain injury versus toxic metabolic  encephalopathy  Recommendations: - repeat MRI brain w/o to re-evaluate if acute process  - check ammonia levels - check routine EEG  - correct metabolic derangements - neurology will continue to follow   Gevena Mart DNP, ACNPC-AG  [facility]   Attending Neurohospitalist Addendum Patient seen and examined with APP/Resident. Agree with the history and physical as documented above. Agree with the plan as documented, which I helped formulate. I have independently reviewed the chart, obtained history, review of systems and examined the patient.I have personally reviewed pertinent head/neck/spine imaging (CT/MRI). Plan was relayed to Dr. Welton Flakes from the primary service Please feel free to call with any questions.  -- Milon Dikes, MD Neurologist [facility] Pager: [phone]

## 2022-11-18 NOTE — Progress Notes (Signed)
EEG complete - results pending 

## 2022-11-18 NOTE — Procedures (Signed)
Patient Name: [name]  MRN: [id number]  Epilepsy Attending: Charlsie Quest  Referring Physician/Provider: Mathews Argyle, NP  Date: 11/18/2022 Duration: 26.25 mins  Patient history: 72yo F s/p cardiac arrest getting eeg to evaluate for seizure  Level of alertness: Awake  AEDs during EEG study: None  Technical aspects: This EEG study was done with scalp electrodes positioned according to the 10-20 International system of electrode placement. Electrical activity was reviewed with band pass filter of 1-70Hz , sensitivity of 7 uV/mm, display speed of 27mm/sec with a 60Hz  notched filter applied as appropriate. EEG data were recorded continuously and digitally stored.  Video monitoring was available and reviewed as appropriate.  Description: The posterior dominant rhythm consists of 7 Hz activity of moderate voltage (25-35 uV) seen predominantly in posterior head regions, symmetric and reactive to eye opening and eye closing. EEG showed continuous generalized 5 to 7 Hz theta slowing admixed with intermittent 2-3hz  delta slowing. Physiologic photic driving was not seen during photic stimulation. Hyperventilation was not performed.     ABNORMALITY - Continuous slow, generalized  IMPRESSION: This study is suggestive of moderate diffuse encephalopathy. No seizures or epileptiform discharges were seen throughout the recording.  Jeb Schloemer Annabelle Harman

## 2022-11-19 ENCOUNTER — Other Ambulatory Visit (HOSPITAL_COMMUNITY): Payer: Medicare Other

## 2022-11-19 ENCOUNTER — Inpatient Hospital Stay (HOSPITAL_COMMUNITY): Payer: Medicare Other

## 2022-11-19 DIAGNOSIS — G9340 Encephalopathy, unspecified: Secondary | ICD-10-CM | POA: Diagnosis not present

## 2022-11-19 LAB — CBC WITH DIFFERENTIAL/PLATELET
Abs Immature Granulocytes: 0.1 10*3/uL — ABNORMAL HIGH (ref 0.00–0.07)
Basophils Absolute: 0 10*3/uL (ref 0.0–0.1)
Basophils Relative: 0 %
Eosinophils Absolute: 0.3 10*3/uL (ref 0.0–0.5)
Eosinophils Relative: 2 %
HCT: 30 % — ABNORMAL LOW (ref 36.0–46.0)
Hemoglobin: 9.6 g/dL — ABNORMAL LOW (ref 12.0–15.0)
Immature Granulocytes: 1 %
Lymphocytes Relative: 23 %
Lymphs Abs: 2.9 10*3/uL (ref 0.7–4.0)
MCH: 28.2 pg (ref 26.0–34.0)
MCHC: 32 g/dL (ref 30.0–36.0)
MCV: 88.2 fL (ref 80.0–100.0)
Monocytes Absolute: 1.2 10*3/uL — ABNORMAL HIGH (ref 0.1–1.0)
Monocytes Relative: 10 %
Neutro Abs: 8 10*3/uL — ABNORMAL HIGH (ref 1.7–7.7)
Neutrophils Relative %: 64 %
Platelets: 147 10*3/uL — ABNORMAL LOW (ref 150–400)
RBC: 3.4 MIL/uL — ABNORMAL LOW (ref 3.87–5.11)
RDW: 17.2 % — ABNORMAL HIGH (ref 11.5–15.5)
WBC: 12.5 10*3/uL — ABNORMAL HIGH (ref 4.0–10.5)
nRBC: 0.9 % — ABNORMAL HIGH (ref 0.0–0.2)

## 2022-11-19 LAB — COMPREHENSIVE METABOLIC PANEL
ALT: 97 U/L — ABNORMAL HIGH (ref 0–44)
ALT: 89 U/L — ABNORMAL HIGH (ref 0–44)
AST: 73 U/L — ABNORMAL HIGH (ref 15–41)
AST: 78 U/L — ABNORMAL HIGH (ref 15–41)
Albumin: 2.7 g/dL — ABNORMAL LOW (ref 3.5–5.0)
Albumin: 2.8 g/dL — ABNORMAL LOW (ref 3.5–5.0)
Alkaline Phosphatase: 153 U/L — ABNORMAL HIGH (ref 38–126)
Alkaline Phosphatase: 140 U/L — ABNORMAL HIGH (ref 38–126)
Anion gap: 13 (ref 5–15)
Anion gap: 12 (ref 5–15)
BUN: 114 mg/dL — ABNORMAL HIGH (ref 8–23)
BUN: 121 mg/dL — ABNORMAL HIGH (ref 8–23)
CO2: 24 mmol/L (ref 22–32)
CO2: 23 mmol/L (ref 22–32)
Calcium: 9.2 mg/dL (ref 8.9–10.3)
Calcium: 9.3 mg/dL (ref 8.9–10.3)
Chloride: 110 mmol/L (ref 98–111)
Chloride: 111 mmol/L (ref 98–111)
Creatinine, Ser: 3.59 mg/dL — ABNORMAL HIGH (ref 0.44–1.00)
Creatinine, Ser: 3.46 mg/dL — ABNORMAL HIGH (ref 0.44–1.00)
GFR, Estimated: 13 mL/min — ABNORMAL LOW (ref >60)
GFR, Estimated: 13 mL/min — ABNORMAL LOW (ref >60)
Glucose, Bld: 163 mg/dL — ABNORMAL HIGH (ref 70–99)
Glucose, Bld: 143 mg/dL — ABNORMAL HIGH (ref 70–99)
Potassium: 4.1 mmol/L (ref 3.5–5.1)
Potassium: 4.1 mmol/L (ref 3.5–5.1)
Sodium: 147 mmol/L — ABNORMAL HIGH (ref 135–145)
Sodium: 146 mmol/L — ABNORMAL HIGH (ref 135–145)
Total Bilirubin: 0.5 mg/dL (ref 0.3–1.2)
Total Bilirubin: 0.4 mg/dL (ref 0.3–1.2)
Total Protein: 7.7 g/dL (ref 6.5–8.1)
Total Protein: 7.5 g/dL (ref 6.5–8.1)

## 2022-11-19 LAB — GLUCOSE, CAPILLARY
Glucose-Capillary: 135 mg/dL — ABNORMAL HIGH (ref 70–99)
Glucose-Capillary: 155 mg/dL — ABNORMAL HIGH (ref 70–99)
Glucose-Capillary: 159 mg/dL — ABNORMAL HIGH (ref 70–99)
Glucose-Capillary: 166 mg/dL — ABNORMAL HIGH (ref 70–99)
Glucose-Capillary: 183 mg/dL — ABNORMAL HIGH (ref 70–99)
Glucose-Capillary: 208 mg/dL — ABNORMAL HIGH (ref 70–99)

## 2022-11-19 LAB — UREA NITROGEN, URINE: Urea Nitrogen, Ur: 843 mg/dL

## 2022-11-19 LAB — POTASSIUM: Potassium: 4.2 mmol/L (ref 3.5–5.1)

## 2022-11-19 MED ORDER — NEPRO/CARBSTEADY PO LIQD
1000.0000 mL | ORAL | Status: DC
  Administered 2022-11-19: 1000 mL

## 2022-11-19 MED ORDER — LACTATED RINGERS IV BOLUS
500.0000 mL | Freq: Once | INTRAVENOUS | Status: AC
  Administered 2022-11-19: 500 mL via INTRAVENOUS

## 2022-11-19 MED ORDER — IOHEXOL 9 MG/ML PO SOLN
500.0000 mL | ORAL | Status: AC
Start: 2022-11-19 — End: 2022-11-19
  Administered 2022-11-19 (×2): 500 mL via ORAL

## 2022-11-19 NOTE — Progress Notes (Signed)
NEUROLOGY CONSULT FOLLOW UP NOTE   Date of service: November 19, 2022 Patient Name: [name] MRN:  [id number] DOB:  04/20/1950  Brief HPI  [name] is a 72 y.o. female  with PMH of CHF, DM, htn, hpl, who presented with cardiac arrest who has had persistent encephalopathy.  She has also had LFT elevation as well as AKI with increasing BUN.   Interval Hx/subjective   She continues to be encephalopathic Vitals   Vitals:   11/18/22 2044 11/19/22 0438 11/19/22 0536 11/19/22 0737  BP: (!) 126/92 121/86  129/82  Pulse: 91 85  86  Resp: 18 18  17   Temp: 97.8 F (36.6 C) (!) 97.5 F (36.4 C)  (!) 97.4 F (36.3 C)  TempSrc: Oral Oral  Oral  SpO2: 100% 100%  100%  Weight:   75.4 kg   Height:         Body mass index is 30.4 kg/m.  Physical Exam   In bed, NAD  Neurologic Examination   Neuro: Mental Status: Patient opens eyes to mild stimuli, however she does not fixate or track the examiner, she does not follow commands.  She does appear to try and orient her head towards voice. Cranial Nerves: II: She does not blink to threat. Pupils are reactive bilaterally, however they are slightly asymmetric III,IV, VI: She appears to have a right gaze preference, however with vestibular ocular reflex, she does cross midline to the left Motor: She withdraws to noxious stimuli bilaterally, however she does not move her left leg is much as the right.   Sensory: As above  Cerebellar: Does not perform       Labs and Diagnostic Imaging   CBC:  Recent Labs  Lab 11/17/22 0750 11/18/22 0740 11/19/22 0642  WBC 8.5 9.6 12.5*  NEUTROABS 5.8  --  8.0*  HGB 9.3* 9.5* 9.6*  HCT 29.7* 29.5* 30.0*  MCV 87.4 87.0 88.2  PLT 120* 131* 147*    Basic Metabolic Panel:  Lab Results  Component Value Date   NA 147 (H) 11/19/2022   K 4.1 11/19/2022   CO2 24 11/19/2022   GLUCOSE 163 (H) 11/19/2022   BUN 114 (H) 11/19/2022   CREATININE 3.59 (H) 11/19/2022   CALCIUM 9.2  11/19/2022   GFRNONAA 13 (L) 11/19/2022   GFRAA 32 (L) 09/24/2019   Lipid Panel:  Lab Results  Component Value Date   LDLCALC 65 04/09/2022   HgbA1c:  Lab Results  Component Value Date   HGBA1C 7.9 (H) 07/27/2022   Urine Drug Screen: No results found for: "LABOPIA", "COCAINSCRNUR", "LABBENZ", "AMPHETMU", "THCU", "LABBARB"  Alcohol Level No results found for: "ETH" INR  Lab Results  Component Value Date   INR 1.22 03/01/2014   APTT  Lab Results  Component Value Date   APTT 47 (H) 03/01/2014   AED levels: No results found for: "PHENYTOIN", "ZONISAMIDE", "LAMOTRIGINE", "LEVETIRACETA"   MRI Brain(Personally reviewed): Significant motion, question of some subtle changes on diffusion and FLAIR in the occipital regions and hippocampi.  Impression   [name] is a 72 y.o. female with cardiac arrest and persistent encephalopathy.  My suspicion is for encephalopathy at this point is likely multifactorial including possible mild anoxic injury, possible contribution from septic encephalopathy from pneumonia, and uremic encephalopathy.   With her acute renal failure, clinical prognosis is very difficult given the confounding factor, and subtle changes on MRI are very difficult to interpret in the setting of anoxic brain injury.  She is scheduled for a repeat MRI today, and though this could be helpful, I think without addressing her underlying renal failure will be difficult to predict her long-term neurological prognosis.  Recommendations  MRI brain Correct metabolic derangements Neurology will follow.  ______________________________________________________________________   Thank you for the opportunity to take part in the care of this patient. If you have any further questions, please contact the neurology consultation team on call. Updated oncall schedule is listed on AMION.  Signed,  Ritta Slot

## 2022-11-19 NOTE — Progress Notes (Addendum)
Overview: [name] is a 72 y.o. with PMH of CAD s/p CABG, chronic combined systolic and diastolic HF (EF<20% 06/05/2022), T2DM, HTN, and HLD found unresponsive by family after hearing her fall, brought into the ED after cardiac arrest s/p CPR. Was found to have PEA -> Vfib s/p defibrillation x3, 5 rounds of epinephrine and amiodarone until ROSC was achieved. She intubated, admitted to the ICU 10/14-10/18 where she was found to have liver and renal shock. Her status was also complicated by aspiration PNA as is s/p 5 days of Unasyn.   Overnight: potassium could not be collected.  Subjective: Resting in bed.  Objective: afebrile, normotensive and satting well on RA.   Vital signs in last 24 hours: Vitals:   11/18/22 1541 11/18/22 2044 11/19/22 0438 11/19/22 0536  BP: 128/82 (!) 126/92 121/86   Pulse: 92 91 85   Resp: 16 18 18    Temp: 98.2 F (36.8 C) 97.8 F (36.6 C) (!) 97.5 F (36.4 C)   TempSrc: Oral Oral Oral   SpO2: 100% 100% 100%   Weight:    75.4 kg  Height:       Supplemental O2: Room Air Last BM Date : 11/18/22 Filed Weights   11/16/22 0704 11/18/22 0500 11/19/22 0536  Weight: 72.1 kg 73.9 kg 75.4 kg    Intake/Output Summary (Last 24 hours) at 11/19/2022 1610 Last data filed at 11/19/2022 0620 Gross per 24 hour  Intake 2891.67 ml  Output 850 ml  Net 2041.67 ml   Net IO Since Admission: 1,978.05 mL [11/19/22 0652]  Physical Exam  General: Laying in bed HENT: head turned to the left side, NG tube present Lungs:  bibasilar rales present, no wheezing heard, auscultation limited to anterior lungs Cardiovascular: regular rate and rhythm, good radial pulse Abdomen: No TTP of abdomen, bowel sounds present MSK: no asymmetry noted Skin: no lesions on skin Neuro: not responding to voice or sternal rub, no grimacing to pain.    Diagnostics    Latest Ref Rng & Units 11/18/2022    7:40 AM 11/17/2022    7:50 AM 11/16/2022    3:54 AM  CBC  WBC 4.0 - 10.5  K/uL 9.6  8.5  9.4   Hemoglobin 12.0 - 15.0 g/dL 9.5  9.3  96.0   Hematocrit 36.0 - 46.0 % 29.5  29.7  31.5   Platelets 150 - 400 K/uL 131  120  118        Latest Ref Rng & Units 11/18/2022    2:17 PM 11/18/2022    7:40 AM 11/17/2022    7:50 AM  BMP  Glucose 70 - 99 mg/dL  454  098   BUN 8 - 23 mg/dL  119  87   Creatinine 1.47 - 1.00 mg/dL  8.29  5.62   Sodium 130 - 145 mmol/L  147  148   Potassium 3.5 - 5.1 mmol/L 4.3  6.5  4.4   Chloride 98 - 111 mmol/L  118  116   CO2 22 - 32 mmol/L  16  22   Calcium 8.9 - 10.3 mg/dL  8.3  8.7      Assessment/Plan: Principal Problem:   Cardiac arrest (HCC) Active Problems:   Ischemic cardiomyopathy  Acute encephalopathy 2/2 to 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 Appreciate neurology's recommendations. MRI today is pending, Ammonia levels WNL. Overall prognosis is still reserved after her prolonged hypoxic injury and  shock to liver and kidneys.  -repeat MRI pending  -consult palliative for GOC with family   AKI CKD Stage IV   Baseline Cr is 2.17-2.18 in march of this year. Cr is 3.59 today with increasing BUN of 114. Cr improved from yesterday. She has expressed in the past not wanting dialysis, holding calling nephro. Her diarrhea and vomiting today will not aid with her AKI.  -500cc LR bolus today  -FWF q 2 hours   Diarrhea  Emesis Had 4 bouts of diarrhea and one bout of emesis since last night. Emesis was non-billous. No blood in stools. Remains afebrile. Bump in WBC. She is on tube feeds which can cause loose stools. Also on laxatives which will be discontinued. Stopped unasyn 2 days ago.  -500cc LR bolus  -dc docusate and miralax  -GI panel  -CT ab pelv without contrast  -Hold tube feeds for now.   Hyperkalemia She is status post calcium gluconate, albuterol, and lokelma yesterday. K today is stable at 4.1.  -Continue to monitor with RFP   -low K feeds when able to resume feeding (holding for now).   Elevated transaminases  Improving. Alk phos increased to 153 since yesterday (130) but improved AST/ALT 73/97, respectively.  -Continue trending with LFTs.  Hypernatremia At 147, unchanged from yesterday.  Likely secondary to tube feeds. Will need FWF. Increase today. Deficit at 1.7L. Holding IV lasix as above.   Acute hypoxic respiratory failure 2/2 aspiration pneumonia On room air. Resolved s/p 5 days of Unasyn. There is an increase in WBC today, 12.5 from 8.5, she remained afebrile. Hgb, Hct, and platelet counts are stable. She completed Unasyn two days ago for aspiration pna. Has emesis and diarrhea today.  -Continue to monitor fever curve  -Tylenol only in cases of fever.   HFrEF CAD with hx of CABG. Cw cardiology's recommended current measures. Pt's echo shows baseline low EF at 20 %.  -Hold lasix given AKI  -Cw amiodarone.   T2DM Will hold tube feeds for now. Will continue with resistant SSI.   Normocytic anemia Likely in the setting of acute illness. Anemia of chronic disease with increased ferritin  Thrombocytosis Improving. Will continue to monitor. Smear shows no platelet abnormality.   Diet: Tube feeds IVF: None VTE: Enoxaparin Code: DNR PT/OT recs: Pending Prior to Admission Living Arrangement: Home Anticipated Discharge Location: Home Barriers to Discharge: Medical Stability Dispo: Anticipated discharge in approximately 5 day(s).   [name], MD  PGY-1  [phone] After 5 pm and on weekends: Please call the on-call pager

## 2022-11-19 NOTE — Progress Notes (Signed)
Patient was taken for the CT via bed at 1820 PM

## 2022-11-19 NOTE — Progress Notes (Addendum)
Patient was taken for MRI at 1015AM via bed. And taken back for cleaning as she had BM over there at 1100AM.

## 2022-11-20 ENCOUNTER — Inpatient Hospital Stay (HOSPITAL_COMMUNITY): Payer: Medicare Other

## 2022-11-20 ENCOUNTER — Encounter (HOSPITAL_COMMUNITY): Payer: Self-pay | Admitting: Internal Medicine

## 2022-11-20 DIAGNOSIS — G9341 Metabolic encephalopathy: Secondary | ICD-10-CM

## 2022-11-20 DIAGNOSIS — L8990 Pressure ulcer of unspecified site, unspecified stage: Secondary | ICD-10-CM | POA: Insufficient documentation

## 2022-11-20 DIAGNOSIS — N179 Acute kidney failure, unspecified: Secondary | ICD-10-CM | POA: Diagnosis not present

## 2022-11-20 DIAGNOSIS — Z7189 Other specified counseling: Secondary | ICD-10-CM

## 2022-11-20 DIAGNOSIS — Z515 Encounter for palliative care: Secondary | ICD-10-CM

## 2022-11-20 DIAGNOSIS — I469 Cardiac arrest, cause unspecified: Secondary | ICD-10-CM | POA: Diagnosis not present

## 2022-11-20 DIAGNOSIS — I255 Ischemic cardiomyopathy: Secondary | ICD-10-CM | POA: Diagnosis not present

## 2022-11-20 LAB — RENAL FUNCTION PANEL
Albumin: 2.7 g/dL — ABNORMAL LOW (ref 3.5–5.0)
Anion gap: 15 (ref 5–15)
BUN: 114 mg/dL — ABNORMAL HIGH (ref 8–23)
CO2: 21 mmol/L — ABNORMAL LOW (ref 22–32)
Calcium: 9.1 mg/dL (ref 8.9–10.3)
Chloride: 109 mmol/L (ref 98–111)
Creatinine, Ser: 3.22 mg/dL — ABNORMAL HIGH (ref 0.44–1.00)
GFR, Estimated: 15 mL/min — ABNORMAL LOW (ref >60)
Glucose, Bld: 151 mg/dL — ABNORMAL HIGH (ref 70–99)
Phosphorus: 3.6 mg/dL (ref 2.5–4.6)
Potassium: 4 mmol/L (ref 3.5–5.1)
Sodium: 145 mmol/L (ref 135–145)

## 2022-11-20 LAB — CBC
HCT: 29.8 % — ABNORMAL LOW (ref 36.0–46.0)
Hemoglobin: 9.5 g/dL — ABNORMAL LOW (ref 12.0–15.0)
MCH: 28.1 pg (ref 26.0–34.0)
MCHC: 31.9 g/dL (ref 30.0–36.0)
MCV: 88.2 fL (ref 80.0–100.0)
Platelets: 163 10*3/uL (ref 150–400)
RBC: 3.38 MIL/uL — ABNORMAL LOW (ref 3.87–5.11)
RDW: 17.2 % — ABNORMAL HIGH (ref 11.5–15.5)
WBC: 10.2 10*3/uL (ref 4.0–10.5)
nRBC: 0.9 % — ABNORMAL HIGH (ref 0.0–0.2)

## 2022-11-20 LAB — GLUCOSE, CAPILLARY
Glucose-Capillary: 152 mg/dL — ABNORMAL HIGH (ref 70–99)
Glucose-Capillary: 153 mg/dL — ABNORMAL HIGH (ref 70–99)
Glucose-Capillary: 162 mg/dL — ABNORMAL HIGH (ref 70–99)
Glucose-Capillary: 188 mg/dL — ABNORMAL HIGH (ref 70–99)
Glucose-Capillary: 237 mg/dL — ABNORMAL HIGH (ref 70–99)
Glucose-Capillary: 64 mg/dL — ABNORMAL LOW (ref 70–99)
Glucose-Capillary: 89 mg/dL (ref 70–99)

## 2022-11-20 LAB — GASTROINTESTINAL PANEL BY PCR, STOOL (REPLACES STOOL CULTURE)

## 2022-11-20 LAB — MAGNESIUM
Magnesium: 3.2 mg/dL — ABNORMAL HIGH (ref 1.7–2.4)
Magnesium: 3.1 mg/dL — ABNORMAL HIGH (ref 1.7–2.4)

## 2022-11-20 LAB — PHOSPHORUS: Phosphorus: 4 mg/dL (ref 2.5–4.6)

## 2022-11-20 MED ORDER — DEXTROSE 50 % IV SOLN
25.0000 g | INTRAVENOUS | Status: AC
Start: 2022-11-20 — End: 2022-11-20
  Administered 2022-11-20: 25 g via INTRAVENOUS
  Filled 2022-11-20: qty 50

## 2022-11-20 MED ORDER — INSULIN ASPART 100 UNIT/ML IJ SOLN
0.0000 [IU] | Freq: Three times a day (TID) | INTRAMUSCULAR | Status: DC
  Administered 2022-11-20: 3 [IU] via SUBCUTANEOUS

## 2022-11-20 MED ORDER — INSULIN ASPART 100 UNIT/ML IJ SOLN
0.0000 [IU] | Freq: Three times a day (TID) | INTRAMUSCULAR | Status: DC
  Administered 2022-11-20: 4 [IU] via SUBCUTANEOUS

## 2022-11-20 MED ORDER — PROSOURCE TF20 ENFIT COMPATIBL EN LIQD
60.0000 mL | Freq: Every day | ENTERAL | Status: DC
  Administered 2022-11-20 – 2022-11-21 (×2): 60 mL
  Filled 2022-11-20 (×3): qty 60

## 2022-11-20 MED ORDER — OSMOLITE 1.5 CAL PO LIQD
1200.0000 mL | ORAL | Status: DC
  Administered 2022-11-20 – 2022-11-21 (×2): 1200 mL
  Filled 2022-11-20 (×2): qty 1422

## 2022-11-20 NOTE — Progress Notes (Signed)
Overview: [name] is a 72 y.o. with PMH of CAD s/p CABG, chronic combined systolic and diastolic HF (EF<20% 06/05/2022), T2DM, HTN, and HLD found unresponsive by family after hearing her fall, brought into the ED after cardiac arrest s/p CPR. Was found to have PEA -> Vfib s/p defibrillation x3, 5 rounds of epinephrine and amiodarone until ROSC was achieved. She intubated, admitted to the ICU 10/14-10/18 where she was found to have liver and renal shock. Her status was also complicated by aspiration PNA as is s/p 5 days of Unasyn.   Overnight: NAEO  Subjective: resting in bed, much more alert, eyes open and responding hi, smiling. Diarrhea and emesis subsided.  Objective:   Vital signs in last 24 hours: Vitals:   11/19/22 1925 11/20/22 0420 11/20/22 0500 11/20/22 0736  BP: (!) 142/91 (!) 141/85  135/88  Pulse: 94 86  90  Resp: 18 17  16   Temp:  (!) 97.5 F (36.4 C)  97.8 F (36.6 C)  TempSrc:  Oral    SpO2: 97% 100%  98%  Weight:   75.3 kg   Height:       Supplemental O2: Room Air Last BM Date : 11/19/22 Filed Weights   11/18/22 0500 11/19/22 0536 11/20/22 0500  Weight: 73.9 kg 75.4 kg 75.3 kg    Intake/Output Summary (Last 24 hours) at 11/20/2022 1610 Last data filed at 11/20/2022 0730 Gross per 24 hour  Intake --  Output 952 ml  Net -952 ml   Net IO Since Admission: 1,102.71 mL [11/20/22 0908]  Physical Exam  General: Laying in bed HENT: eyes open,  NG tube present Lungs:  bibasilar rales present, no wheezing heard, auscultation limited to anterior lungs Cardiovascular: regular rate and rhythm, good radial pulse Abdomen: bowel sounds present, suprapubic tenderness present  MSK: no asymmetry noted Neuro: localizes to pain, does not obey commands, says hi back and responds to no when asked if she is in pain.    Diagnostics    Latest Ref Rng & Units 11/20/2022    6:31 AM 11/19/2022    6:42 AM 11/18/2022    7:40 AM  CBC  WBC 4.0 - 10.5 K/uL 10.2  12.5   9.6   Hemoglobin 12.0 - 15.0 g/dL 9.5  9.6  9.5   Hematocrit 36.0 - 46.0 % 29.8  30.0  29.5   Platelets 150 - 400 K/uL 163  147  131        Latest Ref Rng & Units 11/20/2022    6:31 AM 11/19/2022    8:07 PM 11/19/2022    6:42 AM  BMP  Glucose 70 - 99 mg/dL 960  454  098   BUN 8 - 23 mg/dL 119  147  829   Creatinine 0.44 - 1.00 mg/dL 5.62  1.30  8.65   Sodium 135 - 145 mmol/L 145  146  147   Potassium 3.5 - 5.1 mmol/L 4.0  4.1  4.1   Chloride 98 - 111 mmol/L 109  111  110   CO2 22 - 32 mmol/L 21  23  24    Calcium 8.9 - 10.3 mg/dL 9.1  9.3  9.2      Assessment/Plan: Principal Problem:   Cardiac arrest (HCC) Active Problems:   Ischemic cardiomyopathy  Acute encephalopathy 2/2 to 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 Appreciate neurology's recommendations. MRI today not concerning for  ischemic stroke, Ammonia levels WNL. Overall prognosis is still reserved after her prolonged hypoxic injury and shock to liver and kidneys. She was more alert today, eyes open, responding to some questions. Still does not obey commands. Cr is slightly improved today at Cr 3.22 from 3.46 last night. She is s/p 500 cc LR bolus. She does have a history of CHF last echo revealed EF at 20 %. CT concerning for anasarca. She does have crackles on exam. She expressed not wanting HD in the past.  -consult palliative for GOC with family placed  -free water feeds as below  Diarrhea  Emesis Had 4 bouts of diarrhea and one bout of emesis 11/20/2022 Remains afebrile. WBC decreased from yesterday at 10.2. We discontinued her laxatives and was NPO for a day. Can rechallenge with feeds today. GI panel was negative. No concern for blockage on CT.  -continue holding docusate and miralax -resume tube feeds per RD consult  Hyperkalemia She is status post calcium gluconate, albuterol, and lokelma yesterday. K today  is stable at 4.0  -Continue to monitor with RFP   Elevated transaminases  Improved yesterday. LFTs tomorrow.  Hypernatremia At 145, improved from holding tube feeds. Will need FWF. Deficit at 1.2L. Holding IV lasix as above.  -FWF 200 q 6 hrs   Acute hypoxic respiratory failure 2/2 aspiration pneumonia - resolved Saturating at room air. Has not had a fever WBC WNL.  -Continue to monitor fever curve  -Tylenol only in cases of fever.   HFrEF CAD with hx of CABG. Cw cardiology's recommended current measures. Pt's echo shows baseline low EF at 20 %.  -Hold lasix given AKI  -Cw amiodarone.   T2DM CBGs. Will continue with resistant SSI.   Normocytic anemia Anemia of chronic disease with increased ferritin on labs.   Thrombocytosis Improving. Will continue to monitor. Smear shows no platelet abnormality.   Diet: Tube feeds IVF: None VTE: Enoxaparin Code: DNR PT/OT recs: Pending Prior to Admission Living Arrangement: Home Anticipated Discharge Location: Home Barriers to Discharge: Medical Stability Dispo: Anticipated discharge in approximately 5 day(s).   [name], MD  PGY-1  [phone] After 5 pm and on weekends: Please call the on-call pager

## 2022-11-20 NOTE — Consult Note (Signed)
Consultation Note Date: 11/20/2022   Patient Name: [name]  DOB: Oct 27, 1950  MRN: [id number]  Age / Sex: 72 y.o., female  PCP: Chauncey Mann, DO Referring Physician: Ginnie Smart, MD  Reason for Consultation: Establishing goals of care  HPI/Patient Profile: 72 y.o. female  with past medical history of CAD s/p CABG, chronic combined systolic and diastolic HF (EF<20% 06/05/2022), T2DM, HTN, and HLD  admitted on 11/16/2022 s/p cardiac arrest.   Was found to have PEA -> Vfib s/p defibrillation x3, 5 rounds of epinephrine and amiodarone until ROSC was achieved. She intubated, admitted to the ICU 10/14-10/18 where she was found to have liver and renal shock. Her status was also complicated by aspiration PNA as is s/p 5 days of Unasyn.   PMT has been consulted to assist with goals of care conversation.  Clinical Assessment and Goals of Care:  I have reviewed medical records including EPIC notes, labs and imaging, discussed with RN, assessed the patient and then had a phone conversation with patient's daughter Caedyn to discuss diagnosis prognosis, GOC, EOL wishes, disposition and options.  I introduced Palliative Medicine as specialized medical care for people living with serious illness. It focuses on providing relief from the symptoms and stress of a serious illness. The goal is to improve quality of life for both the patient and the family.  We discussed a brief life review of the patient and then focused on their current illness.   I attempted to elicit values and goals of care important to the patient.    Medical History Review and Understanding:  We discussed patient's acute illness in the context of her chronic comorbidities.  Patient's daughter has a good understanding of the severity of her illness, though she would like assistance with obtaining an update from primary attending.  Social  History: Patient's primary support person is her daughter/HCPOA Boneta Lucks.  She is widowed and has a son who died.  Advance Directives: A detailed discussion regarding advanced directives was had.  Patient has clear documentation on file in [place] which I thoroughly reviewed and discussed with family.  Code Status: Concepts specific to code status, artifical feeding and hydration, and rehospitalization were considered and discussed.   Discussion: Patient's daughter shares that they have had very open conversations about patient's care preferences and wishes in the past, particularly as she was anticipating an open heart surgery.  She has always been very clear that she does not want "any machines" to keep her alive.  She has been clear that if her organs are not working, to let her pass peacefully.  We discussed the gray area of complex medical decision making when patient's organs are working, though seriously dysfunctioning and at high risk for further decline.   Daughter states "I can only do what she tells me" in reference to patient's previously stated wishes.  She is fasting and praying for a good outcome, though she would be accepting of God's will if it is patient's time.  Daughter has had experience with difficult decisions in the past, such as at the time of her brother's death, as well as her father's death from prostate cancer.  She would not want a PEG if patient did not improve with temporary cortrak.   Provided update on recent imaging and test results per her request.  She states she does not need PMT phone number, but rather she is able to be reached 24/7. Patient's daughter is currently overwhelmed as she is recovering from  surgeries herself, as well as babysitting for other family members.  She is not able to visit as much she would like and gets quite distressed when on hold with the patient's unit for extended period of time.  Emotional support and therapeutic listening was  provided.   The difference between aggressive medical intervention and comfort care was considered in light of the patient's goals of care. Hospice and Palliative Care services outpatient were explained and offered.   Discussed the importance of continued conversation with family and the medical providers regarding overall plan of care and treatment options, ensuring decisions are within the context of the patient's values and GOCs.   Questions and concerns were addressed.  The family was encouraged to call with questions or concerns.  PMT will continue to support holistically.   SUMMARY OF RECOMMENDATIONS   -Continue DNR/DNI -Continue supportive care and gentle medical interventions while allowing more time for outcomes -No aggressive interventions such as HD, PEG, or other life support -Ongoing goals of care discussions pending clinical course -Advocated for daughter's request to receive daily updates from primary team -Psychosocial and emotional for provided -PMT continue to follow and support   Prognosis:  Guarded  Discharge Planning: To Be Determined      Primary Diagnoses: Present on Admission:  Cardiac arrest [facility])  Ischemic cardiomyopathy  AKI (acute kidney injury) (HCC)   Physical Exam Vitals and nursing note reviewed.  Constitutional:      General: She is sleeping. She is not in acute distress.    Comments: Cortrak in place  Cardiovascular:     Rate and Rhythm: Normal rate.  Pulmonary:     Effort: Pulmonary effort is normal. No respiratory distress.  Neurological:     Mental Status: She is confused.  Psychiatric:        Cognition and Memory: Cognition is impaired.     Vital Signs: BP 130/78   Pulse 87   Temp 98.2 F (36.8 C)   Resp 18   Ht 5\' 2"  (1.575 m)   Wt 75.3 kg   SpO2 100%   BMI 30.36 kg/m  Pain Scale: Faces   Pain Score: 0-No pain   SpO2: SpO2: 100 % O2 Device:SpO2: 100 % O2 Flow Rate: .O2 Flow Rate (L/min): 3 L/min   Palliative  Assessment/Data: TBD      Total time: I spent 75 minutes in the care of the patient today in the above activities and documenting the encounter.  MDM: High    Nehemias Sauceda Jeni Salles, PA-C  Palliative Medicine Team Team phone # [phone]  Thank you for allowing the Palliative Medicine Team to assist in the care of this patient. Please utilize secure chat with additional questions, if there is no response within 30 minutes please call the above phone number.  Palliative Medicine Team providers are available by phone from 7am to 7pm daily and can be reached through the team cell phone.  Should this patient require assistance outside of these hours, please call the patient's attending physician.

## 2022-11-20 NOTE — Plan of Care (Signed)

## 2022-11-20 NOTE — Progress Notes (Signed)
NEUROLOGY CONSULT FOLLOW UP NOTE   Date of service: November 20, 2022 Patient Name: CHELBI DUNCIL MRN:  [id number] DOB:  [birth year]  Brief HPI  DIONTE PREECE is a 72 y.o. female  with PMH of CHF, DM, htn, hpl, who presented with cardiac arrest who has had persistent encephalopathy.  She has also had LFT elevation as well as AKI with increasing BUN.   Interval Hx/subjective   She continues to be encephalopathic Vitals   Vitals:   11/19/22 1925 11/20/22 0420 11/20/22 0500 11/20/22 0736  BP: (!) 142/91 (!) 141/85  135/88  Pulse: 94 86  90  Resp: 18 17  16   Temp:  (!) 97.5 F (36.4 C)  97.8 F (36.6 C)  TempSrc:  Oral    SpO2: 97% 100%  98%  Weight:   75.3 kg   Height:         Body mass index is 30.36 kg/m.  Physical Exam   In bed, NAD  Neurologic Examination   Neuro: Mental Status: Patient opens eyes to mild stimuli, however she does not fixate or track the examiner, she does not follow commands.  She does appear to try and orient her head towards voice. Cranial Nerves: II: She does not blink to threat. Pupils are reactive bilaterally, however they are slightly asymmetric III,IV, VI: She appears to have a right gaze preference, however with vestibular ocular reflex, she does cross midline to the left Motor: She withdraws to noxious stimuli bilaterally, however she does not move her left leg is much as the right.   Sensory: As above  Cerebellar: Does not perform       Labs and Diagnostic Imaging   CBC:  Recent Labs  Lab 11/17/22 0750 11/18/22 0740 11/19/22 0642 11/20/22 0631  WBC 8.5   < > 12.5* 10.2  NEUTROABS 5.8  --  8.0*  --   HGB 9.3*   < > 9.6* 9.5*  HCT 29.7*   < > 30.0* 29.8*  MCV 87.4   < > 88.2 88.2  PLT 120*   < > 147* 163   < > = values in this interval not displayed.    Basic Metabolic Panel:  Lab Results  Component Value Date   NA 145 11/20/2022   K 4.0 11/20/2022   CO2 21 (L) 11/20/2022   GLUCOSE 151 (H) 11/20/2022   BUN  114 (H) 11/20/2022   CREATININE 3.22 (H) 11/20/2022   CALCIUM 9.1 11/20/2022   GFRNONAA 15 (L) 11/20/2022   GFRAA 32 (L) 09/24/2019   Lipid Panel:  Lab Results  Component Value Date   LDLCALC 65 04/09/2022   HgbA1c:  Lab Results  Component Value Date   HGBA1C 7.9 (H) 07/27/2022   Urine Drug Screen: No results found for: "LABOPIA", "COCAINSCRNUR", "LABBENZ", "AMPHETMU", "THCU", "LABBARB"  Alcohol Level No results found for: "ETH" INR  Lab Results  Component Value Date   INR 1.22 03/01/2014   APTT  Lab Results  Component Value Date   APTT 47 (H) 03/01/2014   AED levels: No results found for: "PHENYTOIN", "ZONISAMIDE", "LAMOTRIGINE", "LEVETIRACETA"   MRI Brain(Personally reviewed): Significant motion, question of some subtle changes on diffusion and FLAIR in the occipital regions and hippocampi.  Impression   PIPPIN BURDINE is a 72 y.o. female with cardiac arrest and persistent encephalopathy.  My suspicion is for encephalopathy at this point is likely multifactorial including possible mild anoxic injury, possible contribution from septic encephalopathy from pneumonia, and uremic encephalopathy.  She does not have the appearance of a severe anoxic brian injury and even if this is playing some role, at this point prognosis would be relatively promising and she would need multiple months to decide where she would plateau.  I suspect at this point her metabolic issues, in particular her AKI are playing a larger role in her encephalopathy.  Recommendations  Correct metabolic derangements Supportive care Neurology will be available as needed.   ______________________________________________________________________   Thank you for the opportunity to take part in the care of this patient. If you have any further questions, please contact the neurology consultation team on call. Updated oncall schedule is listed on AMION.  Signed,  Ritta Slot

## 2022-11-20 NOTE — Progress Notes (Signed)
Patient was taken for the MRI at 0735 AM via bed

## 2022-11-20 NOTE — Progress Notes (Addendum)
Nutrition Follow-up  DOCUMENTATION CODES:   Not applicable  INTERVENTION:  Initiate tube feeding via Corttrak: Osmolite 1.5 at 50 ml/h (1200 ml per day).  Start at 13ml/hr and advance rate by 10ml every 12 hrs as tolerated to goal rate of 45ml/hr Prosource TF20 60 ml daily  Provides 1880 kcal, 95 gm protein, 914 ml free water daily not including FWF.   Consider Reglan   NUTRITION DIAGNOSIS:   Inadequate oral intake related to inability to eat as evidenced by NPO status.    GOAL:   Patient will meet greater than or equal to 90% of their needs    MONITOR:   Labs, Vent status, Weight trends, TF tolerance  REASON FOR ASSESSMENT:   Ventilator, Consult Enteral/tube feeding initiation and management  ASSESSMENT:   72 year old female who presented to the ED on 10/14 after a cardiac arrest. PMH of CAD s/p CABG, CHF with reduced EF, CKD stage IV, T2DM, HTN, HLD. TF- Osmolite 1.5 at 53ml/hr Prosource 60ml once day, FWF every 4 hours.  10/15-TF initiated at 53ml/hr with orders to advance by 10ml every 4 hrs, TF held later in evening after emesis, 10/16 - post-pyloric Cortrak placement attempted but unsuccessful (left gastric), several episodes of emesis 10/17 - trickle TF resumed via Cortrak, extubated 10/18- RD contact to advance feeding. Goal rate of 48ml/hr.  10/21- Reported  4 bouts of diarrhea, one bout of emesis.  Emesis was non-bilious.  Feeding held. MD reached out to. Pending rounds and family care plan meeting.  Should TF restart please refer to interventions in above note.   Weight history: 11/20/22 75.3 kg  09/04/22 78.6 kg  09/04/22 78.6 kg  08/13/22 76.1 kg  08/07/22 76.8 kg  07/30/22 76.8 kg  05/22/22 83 kg    Hospital weight history: Date/Time Weight Weight in lbs  11/20/22 0500 75.3 kg 166.01 lbs  11/19/22 0536 75.4 kg 166.23 lbs  11/18/22 0500 73.9 kg 162.92 lbs  11/16/22 0704 72.1 kg 158.95 lbs  11/14/22 0702 82.1 kg 181 lbs  11/13/22 0500  82 kg 180.78 lbs  11/12/22 0700 80 kg 176.37 lb   NUTRITION - FOCUSED PHYSICAL EXAM:  Flowsheet Row Most Recent Value  Orbital Region No depletion  Upper Arm Region No depletion  Thoracic and Lumbar Region No depletion  Buccal Region Unable to assess  Temple Region Moderate depletion  Clavicle Bone Region No depletion  Clavicle and Acromion Bone Region Mild depletion  Scapular Bone Region Unable to assess  Dorsal Hand Mild depletion  Patellar Region No depletion  Anterior Thigh Region No depletion  Posterior Calf Region No depletion  Edema (RD Assessment) Mild  Hair Reviewed  Eyes Reviewed  Mouth Reviewed  Skin Reviewed  Nails Reviewed       Diet Order:   Diet Order             Diet NPO time specified  Diet effective now                   EDUCATION NEEDS:   No education needs have been identified at this time  Skin:  Skin Assessment: Reviewed RN Assessment  Last BM:  11/15/22 multiple type 6  Height:   Ht Readings from Last 1 Encounters:  11/12/22 5\' 2"  (1.575 m)    Weight:   Wt Readings from Last 1 Encounters:  11/20/22 75.3 kg    Ideal Body Weight:  50 kg  BMI:  Body mass index is 30.36 kg/m.  Estimated  Nutritional Needs:   Kcal:  1750-1950  Protein:  85-100 grams  Fluid:  1.7-1.9 L    Jamelle Haring RDN, LDN Clinical Dietitian  RDN pager # available on Amion

## 2022-11-20 NOTE — Progress Notes (Signed)
Patient is back on the floor after MRI

## 2022-11-20 NOTE — Progress Notes (Signed)
Physical Therapy Treatment Patient Details Name: [name] MRN: [id number] DOB: 12/09/1950 Today's Date: 11/20/2022   History of Present Illness Patient is a 72 y/o female admitted 19-Nov-2022 after cardiac arrest, was intubated, extubated 10/17.  MRI 10/16 ischemic injury involving cerebral cortex.  PMH - CHF/ischemic cardiomyopathy EF 25-30%, DM, CAD, CKD, MI, CABG, HTN.    PT Comments  Pt seen for PT tx with pt received in bed, nurse in room & assisting throughout session. Pt requires total assist +1-2 for all mobility but does tolerate sitting EOB ~5 minutes with max assist, 1 brief episode of close supervision but then returns to max assist. Pt moaning throughout session, does appear to respond "yes" to a question but otherwise does not follow commands; pt does not demonstrate startle reflex/blink when object brought to eyes but overall appears more responsive compared to previous dates. Will continue to follow pt acutely to progress mobility as able.    If plan is discharge home, recommend the following: Two people to help with walking and/or transfers;Assist for transportation;Help with stairs or ramp for entrance;Two people to help with bathing/dressing/bathroom;Assistance with feeding;Assistance with cooking/housework   Can travel by private vehicle     No  Equipment Recommendations  None recommended by PT (defer to next venue)    Recommendations for Other Services       Precautions / Restrictions Precautions Precautions: Fall Precaution Comments: coretrak Restrictions Weight Bearing Restrictions: No     Mobility  Bed Mobility Overal bed mobility: Needs Assistance Bed Mobility: Rolling, Supine to Sit, Sit to Supine Rolling: Total assist, +2 for physical assistance, +2 for safety/equipment, Used rails   Supine to sit: Total assist, HOB elevated, Used rails Sit to supine: Total assist, +2 for physical assistance, +2 for safety/equipment   General bed mobility  comments: +2 total assist to scoot to [facility] with bed in trendelenburg position    Transfers                        Ambulation/Gait                   Stairs             Wheelchair Mobility     Tilt Bed    Modified Rankin (Stroke Patients Only)       Balance Overall balance assessment: Needs assistance Sitting-balance support: No upper extremity supported Sitting balance-Leahy Scale: Poor Sitting balance - Comments: Pt requires max assist for static sitting balance, sat EOB ~5 minutes, did have one brief episode of sitting with close supervision<>CGA but then requires assistance. Pt maintains cervical flexion, decreased ability to hold head upright, posterior LOB at times.                                    Cognition Arousal: Alert Behavior During Therapy: Flat affect Overall Cognitive Status: Impaired/Different from baseline                                 General Comments: Pt briefly responding to PT, answered 1-2 simple questions, responds "yes", Restless throughout session, no safety awareness or overall awareness.        Exercises      General Comments General comments (skin integrity, edema, etc.): Pt noted to be incontinent of BM, requires total assist for peri  hygiene & changing bed linens. Nurse in room & assisting throughout session.      Pertinent Vitals/Pain Pain Assessment Pain Assessment: PAINAD Breathing: normal Negative Vocalization: occasional moan/groan, low speech, negative/disapproving quality Facial Expression: smiling or inexpressive Body Language: relaxed Consolability: no need to console PAINAD Score: 1 Facial Expression: Relaxed, neutral Body Movements: Restlessness Muscle Tension: Relaxed    Home Living                          Prior Function            PT Goals (current goals can now be found in the care plan section) Acute Rehab PT Goals Patient Stated Goal: unable  to state PT Goal Formulation: Patient unable to participate in goal setting Time For Goal Achievement: 11/30/22 Potential to Achieve Goals: Fair Progress towards PT goals: Progressing toward goals    Frequency    Min 1X/week      PT Plan      Co-evaluation              AM-PAC PT "6 Clicks" Mobility   Outcome Measure  Help needed turning from your back to your side while in a flat bed without using bedrails?: Total Help needed moving from lying on your back to sitting on the side of a flat bed without using bedrails?: Total Help needed moving to and from a bed to a chair (including a wheelchair)?: Total Help needed standing up from a chair using your arms (e.g., wheelchair or bedside chair)?: Total Help needed to walk in hospital room?: Total Help needed climbing 3-5 steps with a railing? : Total 6 Click Score: 6    End of Session   Activity Tolerance: Patient tolerated treatment well Patient left: in bed;with call bell/phone within reach;with bed alarm set   PT Visit Diagnosis: Muscle weakness (generalized) (M62.81);Other symptoms and signs involving the nervous system (R29.898);Other abnormalities of gait and mobility (R26.89)     Time: 1610-9604 PT Time Calculation (min) (ACUTE ONLY): 23 min  Charges:    $Therapeutic Activity: 23-37 mins PT General Charges $$ ACUTE PT VISIT: 1 Visit                     Aleda Grana, PT, DPT 11/20/22, 12:49 PM   Sandi Mariscal 11/20/2022, 12:47 PM

## 2022-11-21 ENCOUNTER — Inpatient Hospital Stay (HOSPITAL_COMMUNITY): Payer: Medicare Other

## 2022-11-21 DIAGNOSIS — Z515 Encounter for palliative care: Secondary | ICD-10-CM | POA: Diagnosis not present

## 2022-11-21 DIAGNOSIS — I469 Cardiac arrest, cause unspecified: Secondary | ICD-10-CM | POA: Diagnosis not present

## 2022-11-21 DIAGNOSIS — N179 Acute kidney failure, unspecified: Secondary | ICD-10-CM | POA: Diagnosis not present

## 2022-11-21 DIAGNOSIS — Z7189 Other specified counseling: Secondary | ICD-10-CM | POA: Diagnosis not present

## 2022-11-21 LAB — CBC
HCT: 26.5 % — ABNORMAL LOW (ref 36.0–46.0)
Hemoglobin: 8.5 g/dL — ABNORMAL LOW (ref 12.0–15.0)
MCH: 27.9 pg (ref 26.0–34.0)
MCHC: 32.1 g/dL (ref 30.0–36.0)
MCV: 86.9 fL (ref 80.0–100.0)
Platelets: 145 10*3/uL — ABNORMAL LOW (ref 150–400)
RBC: 3.05 MIL/uL — ABNORMAL LOW (ref 3.87–5.11)
RDW: 17.2 % — ABNORMAL HIGH (ref 11.5–15.5)
WBC: 8.1 10*3/uL (ref 4.0–10.5)
nRBC: 2.1 % — ABNORMAL HIGH (ref 0.0–0.2)

## 2022-11-21 LAB — COMPREHENSIVE METABOLIC PANEL
ALT: 98 U/L — ABNORMAL HIGH (ref 0–44)
AST: 128 U/L — ABNORMAL HIGH (ref 15–41)
Albumin: 2.6 g/dL — ABNORMAL LOW (ref 3.5–5.0)
Alkaline Phosphatase: 163 U/L — ABNORMAL HIGH (ref 38–126)
Anion gap: 13 (ref 5–15)
BUN: 128 mg/dL — ABNORMAL HIGH (ref 8–23)
CO2: 19 mmol/L — ABNORMAL LOW (ref 22–32)
Calcium: 8.2 mg/dL — ABNORMAL LOW (ref 8.9–10.3)
Chloride: 107 mmol/L (ref 98–111)
Creatinine, Ser: 3.39 mg/dL — ABNORMAL HIGH (ref 0.44–1.00)
GFR, Estimated: 14 mL/min — ABNORMAL LOW (ref >60)
Glucose, Bld: 437 mg/dL — ABNORMAL HIGH (ref 70–99)
Potassium: 4.5 mmol/L (ref 3.5–5.1)
Sodium: 139 mmol/L (ref 135–145)
Total Bilirubin: 0.7 mg/dL (ref 0.3–1.2)
Total Protein: 6.7 g/dL (ref 6.5–8.1)

## 2022-11-21 LAB — GLUCOSE, CAPILLARY
Glucose-Capillary: 168 mg/dL — ABNORMAL HIGH (ref 70–99)
Glucose-Capillary: 283 mg/dL — ABNORMAL HIGH (ref 70–99)
Glucose-Capillary: 337 mg/dL — ABNORMAL HIGH (ref 70–99)
Glucose-Capillary: 365 mg/dL — ABNORMAL HIGH (ref 70–99)
Glucose-Capillary: 421 mg/dL — ABNORMAL HIGH (ref 70–99)
Glucose-Capillary: 422 mg/dL — ABNORMAL HIGH (ref 70–99)
Glucose-Capillary: 93 mg/dL (ref 70–99)

## 2022-11-21 LAB — MAGNESIUM
Magnesium: 3.1 mg/dL — ABNORMAL HIGH (ref 1.7–2.4)
Magnesium: 3.2 mg/dL — ABNORMAL HIGH (ref 1.7–2.4)

## 2022-11-21 LAB — PHOSPHORUS
Phosphorus: 3.9 mg/dL (ref 2.5–4.6)
Phosphorus: 3.4 mg/dL (ref 2.5–4.6)

## 2022-11-21 MED ORDER — ACETAMINOPHEN 160 MG/5ML PO SOLN
650.0000 mg | Freq: Three times a day (TID) | ORAL | Status: DC
  Administered 2022-11-21: 650 mg
  Filled 2022-11-21 (×2): qty 20.3

## 2022-11-21 MED ORDER — DEXTROSE 50 % IV SOLN: 25.0000 g | INTRAVENOUS | Status: DC

## 2022-11-21 MED ORDER — INSULIN ASPART 100 UNIT/ML IJ SOLN
0.0000 [IU] | INTRAMUSCULAR | Status: DC
  Administered 2022-11-21: 20 [IU] via SUBCUTANEOUS
  Administered 2022-11-21: 4 [IU] via SUBCUTANEOUS
  Administered 2022-11-21: 7 [IU] via SUBCUTANEOUS

## 2022-11-21 MED ORDER — BANATROL TF EN LIQD: 60.0000 mL | Freq: Two times a day (BID) | ENTERAL | Status: DC

## 2022-11-21 NOTE — Progress Notes (Signed)
Daily Progress Note   Patient Name: [name]       Date: 11/21/2022 DOB: September 13, 1950  Age: 72 y.o. MRN#: [id number] Attending Physician: Ginnie Smart, MD Primary Care Physician: Chauncey Mann, DO Admit Date: 11/09/2022  Reason for Consultation/Follow-up: Establishing goals of care  Subjective: Medical records reviewed including progress notes, labs, imaging. Patient assessed at the bedside.  She is resting comfortably with cortrak in place. Did not attempt to arouse in order to preserve comfort.  No family present during my visit.  Called patient's daughter Jazly for ongoing goals of care discussions and palliative support.  She states that she has not received an update yet today, nor did she hear from the doctor yesterday.  Offered to advocate for an update and she is appreciative.  I then reviewed today's lab work my concern for worsening discomfort.  We discussed current care plan and concern for patient's tenuous condition possibly declining rather than improving.  She states that she understands.    I explored daughter's thoughts on how to proceed with patient's care in light of today's findings.  She continues to feel that she is doing all that she can by respecting patient's previously stated wishes and "doing what she told me."  Daughter would like to continue with medical interventions and see how her mother does in response.  While she is concerned that patient will continue to decline, she does not want to call people to visit her " or see her like this" just yet.  Emotional support and therapeutic listening was provided.  Questions and concerns addressed. PMT will continue to support holistically.   Length of Stay: 9   Physical Exam Vitals and nursing note reviewed.   Constitutional:      General: She is sleeping. She is not in acute distress.    Appearance: She is ill-appearing.  Cardiovascular:     Rate and Rhythm: Normal rate.  Pulmonary:     Effort: Pulmonary effort is normal. No respiratory distress.  Neurological:     Mental Status: She is lethargic.  Psychiatric:        Cognition and Memory: Cognition is impaired.             Vital Signs: BP 113/77 (BP Location: Left Arm)   Pulse 86   Temp 98.1 F (36.7 C)   Resp 18   Ht 5\' 2"  (1.575 m)   Wt 75.3 kg   SpO2 100%   BMI 30.36 kg/m  SpO2: SpO2: 100 % O2 Device: O2 Device: Room Air O2 Flow Rate: O2 Flow Rate (L/min): 3 L/min      Palliative Assessment/Data: 30% (on tube feeds)    Palliative Care Assessment & Plan   Patient Profile: 72 y.o. female  with past medical history of CAD s/p CABG, chronic combined systolic and diastolic HF (EF<20% 06/05/2022), T2DM, HTN, and HLD  admitted on 11/01/2022 s/p cardiac arrest.    Was found to have PEA -> Vfib s/p defibrillation x3, 5 rounds of epinephrine and amiodarone until ROSC was achieved. She intubated, admitted to the ICU 10/14-10/18 where she was found to have liver and renal shock. Her status was also complicated by aspiration PNA  as is s/p 5 days of Unasyn.    PMT has been consulted to assist with goals of care conversation.  Assessment: Goals of care conversation Acute metabolic encephalopathy AKI on CKD 4, worsening Elevated transaminases, worsening Status post cardiac arrest  Recommendations/Plan: Continue DNR/DNI Continue current care plan. No escalation or aggressive interventions such as HD, PEG, or other life support  Ongoing goals of care discussions pending clinical course Advocated for daughter's request to receive daily updates from primary team Psychosocial and emotional for provided PMT continue to follow and support   Prognosis: Poor  Discharge Planning: To Be Determined  Care plan was discussed with  patient's daughter, primary team   Total time: I spent 35 minutes in the care of the patient today in the above activities and documenting the encounter.   Wendie Diskin Jeni Salles, PA-C  Palliative Medicine Team Team phone # [phone]  Thank you for allowing the Palliative Medicine Team to assist in the care of this patient. Please utilize secure chat with additional questions, if there is no response within 30 minutes please call the above phone number.  Palliative Medicine Team providers are available by phone from 7am to 7pm daily and can be reached through the team cell phone.  Should this patient require assistance outside of these hours, please call the patient's attending physician.

## 2022-11-21 NOTE — Progress Notes (Signed)
Overview: [name] is a 72 y.o. with PMH of CAD s/p CABG, chronic combined systolic and diastolic HF (EF<20% 06/05/2022), T2DM, HTN, and HLD found unresponsive by family after hearing her fall, brought into the ED after cardiac arrest s/p CPR. Was found to have PEA -> Vfib s/p defibrillation x3, 5 rounds of epinephrine and amiodarone until ROSC was achieved. She intubated, admitted to the ICU 10/14-10/18 where she was found to have liver and renal shock. Her status was also complicated by aspiration PNA as is s/p 5 days of Unasyn.   Overnight: NAEO  Subjective: Is in more distress today. Localized to her upper abdomen.   Objective:   Vital signs in last 24 hours: Vitals:   11/20/22 2033 11/21/22 0451 11/21/22 0735 11/21/22 1209  BP: 113/60 (!) 115/91 113/77 (!) 125/104  Pulse: 88 89 86 85  Resp: 17 16 18    Temp: (!) 97.4 F (36.3 C) 98.1 F (36.7 C)  98.2 F (36.8 C)  TempSrc:    Oral  SpO2: 97% 98% 100% 100%  Weight:      Height:       Supplemental O2: Room Air Last BM Date : 11/21/22 Filed Weights   11/18/22 0500 11/19/22 0536 11/20/22 0500  Weight: 73.9 kg 75.4 kg 75.3 kg    Intake/Output Summary (Last 24 hours) at 11/21/2022 1418 Last data filed at 11/21/2022 1349 Gross per 24 hour  Intake 400 ml  Output 552 ml  Net -152 ml   Net IO Since Admission: 750.71 mL [11/21/22 1418]  Physical Exam  General: Laying in bed, seems in pain HENT: eyes open, NG tube present Lungs: bibasilar rales present, no wheezing heard, auscultation limited to anterior lungs Cardiovascular: regular rate and rhythm, good radial pulse Abdomen: bowel sounds present, TTP on epigastric region today MSK: no asymmetry noted Neuro: localizes to pain, does not obey commands, says hi back    Diagnostics    Latest Ref Rng & Units 11/21/2022    5:03 AM 11/20/2022    6:31 AM 11/19/2022    6:42 AM  CBC  WBC 4.0 - 10.5 K/uL 8.1  10.2  12.5   Hemoglobin 12.0 - 15.0 g/dL 8.5  9.5  9.6    Hematocrit 36.0 - 46.0 % 26.5  29.8  30.0   Platelets 150 - 400 K/uL 145  163  147        Latest Ref Rng & Units 11/21/2022    5:03 AM 11/20/2022    6:31 AM 11/19/2022    8:07 PM  BMP  Glucose 70 - 99 mg/dL 161  096  045   BUN 8 - 23 mg/dL 409  811  914   Creatinine 0.44 - 1.00 mg/dL 7.82  9.56  2.13   Sodium 135 - 145 mmol/L 139  145  146   Potassium 3.5 - 5.1 mmol/L 4.5  4.0  4.1   Chloride 98 - 111 mmol/L 107  109  111   CO2 22 - 32 mmol/L 19  21  23    Calcium 8.9 - 10.3 mg/dL 8.2  9.1  9.3      Assessment/Plan: Principal Problem:   Cardiac arrest (HCC) Active Problems:   Ischemic cardiomyopathy   AKI (acute kidney injury) (HCC)   Pressure injury of skin  Acute encephalopathy 2/2 to 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 Worsening Cr function and LFTs today. Appreciate palliative's involvement and assistance. Supportive care and  gentle medical interventions for now. No raise in WBC or fevers ON, had catheter in. Will remove catheter and start purewick. Had two BM ON and per nurse no blood reported.  -free water feeds as below -Purewick  -Tylenol 650 mg TID  -Monitor BMs for any blood/changes in color  -Continue on famotidine 10 mg   Diarrhea  Emesis Had 4 bouts of diarrhea and one bout of emesis 11/20/2022 Remains afebrile. WBC decreased from yesterday at 10.2. We discontinued her laxatives and was NPO for a day. GI panel was negative. No concern for blockage on CT. May be related to her tube placement or laxatives. Seemed in pain today and pulled feeding tube. -continue holding docusate and miralax -cw tube feeds  -FU abdominal XR   Elevated transaminases  Seems worse than last time.  -Continue to monitor with LFTs  Hypernatremia Now within range. Will need FWF due to worsening renal function.  - continue holding IV lasix as above.  -continue FWF 200 q 6 hrs   Acute hypoxic respiratory failure 2/2  aspiration pneumonia - resolved Saturating at room air. Has not had a fever WBC WNL.  -Continue to monitor fever curve. -Tylenol only in cases of fever.   HFrEF CAD with hx of CABG. Cw cardiology's recommended current measures. Pt's echo shows baseline low EF at 20 %.  -Hold lasix given AKI  -Cw amiodarone.   T2DM CBGs. Was hyperglycemic today, resistant scale switched to every 4 hours.  Normocytic anemia Anemia of chronic disease with increased ferritin on labs.   Thrombocytosis Improving. Will continue to monitor. Smear shows no platelet abnormality.   Diet: Tube feeds IVF: None VTE: Enoxaparin Code: DNR PT/OT recs: Pending Prior to Admission Living Arrangement: Home Anticipated Discharge Location: Home Barriers to Discharge: Medical Stability Dispo: Anticipated discharge in approximately 5 day(s).   [name], MD  PGY-1  [phone] After 5 pm and on weekends: Please call the on-call pager

## 2022-11-21 NOTE — Progress Notes (Signed)
Patient pulled feeding tube out again. Physician was notified. Decision was made to hold off on replacing NG tube at this time. Tube feeds are off and p.o. medications held.

## 2022-11-21 NOTE — Progress Notes (Signed)
Nutrition Follow-up  DOCUMENTATION CODES:   Not applicable  INTERVENTION: Initiate tube feeding via Corttrak: Osmolite 1.5 at 50 ml/h (1200 ml per day).  Start at 39ml/hr and advance rate by 10ml every 12 hrs as tolerated to goal rate of 36ml/hr Prosource TF20 60 ml daily  Banatrol 60ml BID Provides 1880 kcal, 95 gm protein, 914 ml free water daily not including FWF.    NUTRITION DIAGNOSIS:   Inadequate oral intake related to inability to eat as evidenced by NPO status.    GOAL:   Patient will meet greater than or equal to 90% of their needs    MONITOR:   Labs, Vent status, Weight trends, TF tolerance  REASON FOR ASSESSMENT:   Ventilator, Consult Enteral/tube feeding initiation and management  ASSESSMENT:   72 year old female who presented to the ED on 10/14 after a cardiac arrest. PMH of CAD s/p CABG, CHF with reduced EF, CKD stage IV, T2DM, HTN, HLD.  Cortrak feeding restarted yesterday.  RN reports multiple watery Bowel movements last night. Running at goal rate of 31ml/hr.  Palliative consulted with goals of care addressed with daughter. Daughter stated that she is ok with current support of nutrition via cortrak although does not want PEG placed. NUTRITION - FOCUSED PHYSICAL EXAM:  Flowsheet Row Most Recent Value  Orbital Region No depletion  Upper Arm Region No depletion  Thoracic and Lumbar Region No depletion  Buccal Region Unable to assess  Temple Region Moderate depletion  Clavicle Bone Region No depletion  Clavicle and Acromion Bone Region Mild depletion  Scapular Bone Region Unable to assess  Dorsal Hand Mild depletion  Patellar Region No depletion  Anterior Thigh Region No depletion  Posterior Calf Region No depletion  Edema (RD Assessment) Mild  Hair Reviewed  Eyes Reviewed  Mouth Reviewed  Skin Reviewed  Nails Reviewed       Diet Order:   Diet Order     None       EDUCATION NEEDS:   No education needs have been identified at  this time  Skin:  Skin Assessment: Reviewed RN Assessment  Last BM:  10/23  Height:   Ht Readings from Last 1 Encounters:  11/12/22 5\' 2"  (1.575 m)    Weight:   Wt Readings from Last 1 Encounters:  11/20/22 75.3 kg    Ideal Body Weight:  50 kg  BMI:  Body mass index is 30.36 kg/m.  Estimated Nutritional Needs:   Kcal:  1750-1950  Protein:  85-100 grams  Fluid:  1.7-1.9 L    Jamelle Haring RDN, LDN Clinical Dietitian  RDN pager # available on Amion

## 2022-11-21 NOTE — Care Management Important Message (Signed)
Important Message  Patient Details  Name: NATOSHA PADGET MRN: [id number] Date of Birth: Oct 26, 1950   Important Message Given:  Yes - Medicare IM     Halia Franey 11/21/2022, 1:32 PM

## 2022-11-21 NOTE — Plan of Care (Signed)

## 2022-11-22 DIAGNOSIS — I469 Cardiac arrest, cause unspecified: Secondary | ICD-10-CM | POA: Diagnosis not present

## 2022-11-22 DIAGNOSIS — Z515 Encounter for palliative care: Secondary | ICD-10-CM | POA: Diagnosis not present

## 2022-11-22 DIAGNOSIS — E872 Acidosis, unspecified: Secondary | ICD-10-CM | POA: Diagnosis not present

## 2022-11-22 DIAGNOSIS — N179 Acute kidney failure, unspecified: Secondary | ICD-10-CM | POA: Diagnosis not present

## 2022-11-22 DIAGNOSIS — Z7189 Other specified counseling: Secondary | ICD-10-CM | POA: Diagnosis not present

## 2022-11-22 LAB — RENAL FUNCTION PANEL
Albumin: 3 g/dL — ABNORMAL LOW (ref 3.5–5.0)
Anion gap: 17 — ABNORMAL HIGH (ref 5–15)
BUN: 141 mg/dL — ABNORMAL HIGH (ref 8–23)
CO2: 16 mmol/L — ABNORMAL LOW (ref 22–32)
Calcium: 8.8 mg/dL — ABNORMAL LOW (ref 8.9–10.3)
Chloride: 114 mmol/L — ABNORMAL HIGH (ref 98–111)
Creatinine, Ser: 3.45 mg/dL — ABNORMAL HIGH (ref 0.44–1.00)
GFR, Estimated: 14 mL/min — ABNORMAL LOW (ref >60)
Glucose, Bld: 110 mg/dL — ABNORMAL HIGH (ref 70–99)
Phosphorus: 4.2 mg/dL (ref 2.5–4.6)
Potassium: 4.7 mmol/L (ref 3.5–5.1)
Sodium: 147 mmol/L — ABNORMAL HIGH (ref 135–145)

## 2022-11-22 LAB — HEPATIC FUNCTION PANEL
ALT: 134 U/L — ABNORMAL HIGH (ref 0–44)
AST: 169 U/L — ABNORMAL HIGH (ref 15–41)
Albumin: 3 g/dL — ABNORMAL LOW (ref 3.5–5.0)
Alkaline Phosphatase: 154 U/L — ABNORMAL HIGH (ref 38–126)
Bilirubin, Direct: 0.3 mg/dL — ABNORMAL HIGH (ref 0.0–0.2)
Indirect Bilirubin: 0.8 mg/dL (ref 0.3–0.9)
Total Bilirubin: 1.1 mg/dL (ref 0.3–1.2)
Total Protein: 7.6 g/dL (ref 6.5–8.1)

## 2022-11-22 LAB — CBC
HCT: 27.8 % — ABNORMAL LOW (ref 36.0–46.0)
Hemoglobin: 8.9 g/dL — ABNORMAL LOW (ref 12.0–15.0)
MCH: 28 pg (ref 26.0–34.0)
MCHC: 32 g/dL (ref 30.0–36.0)
MCV: 87.4 fL (ref 80.0–100.0)
Platelets: 145 10*3/uL — ABNORMAL LOW (ref 150–400)
RBC: 3.18 MIL/uL — ABNORMAL LOW (ref 3.87–5.11)
RDW: 18 % — ABNORMAL HIGH (ref 11.5–15.5)
WBC: 11.4 10*3/uL — ABNORMAL HIGH (ref 4.0–10.5)
nRBC: 1.7 % — ABNORMAL HIGH (ref 0.0–0.2)

## 2022-11-22 LAB — PHOSPHORUS: Phosphorus: 4.1 mg/dL (ref 2.5–4.6)

## 2022-11-22 LAB — LACTIC ACID, PLASMA
Lactic Acid, Venous: 3.3 mmol/L (ref 0.5–1.9)
Lactic Acid, Venous: 3 mmol/L (ref 0.5–1.9)

## 2022-11-22 LAB — GLUCOSE, CAPILLARY
Glucose-Capillary: 92 mg/dL (ref 70–99)
Glucose-Capillary: 121 mg/dL — ABNORMAL HIGH (ref 70–99)
Glucose-Capillary: 153 mg/dL — ABNORMAL HIGH (ref 70–99)
Glucose-Capillary: 156 mg/dL — ABNORMAL HIGH (ref 70–99)
Glucose-Capillary: 170 mg/dL — ABNORMAL HIGH (ref 70–99)

## 2022-11-22 LAB — MAGNESIUM: Magnesium: 3.3 mg/dL — ABNORMAL HIGH (ref 1.7–2.4)

## 2022-11-22 MED ORDER — SODIUM BICARBONATE 8.4 % IV SOLN
50.0000 meq | INTRAVENOUS | Status: AC
  Administered 2022-11-22 – 2022-11-23 (×2): 50 meq via INTRAVENOUS
  Filled 2022-11-22 (×2): qty 50

## 2022-11-22 MED ORDER — DEXTROSE 5 % IV BOLUS
1000.0000 mL | Freq: Once | INTRAVENOUS | Status: AC
  Administered 2022-11-22: 1000 mL via INTRAVENOUS

## 2022-11-22 MED ORDER — HYDROMORPHONE HCL 1 MG/ML IJ SOLN: 0.2500 mg | INTRAMUSCULAR | Status: DC | PRN

## 2022-11-22 MED ORDER — INSULIN ASPART 100 UNIT/ML IJ SOLN
0.0000 [IU] | INTRAMUSCULAR | Status: DC
  Administered 2022-11-22 – 2022-11-23 (×3): 1 [IU] via SUBCUTANEOUS

## 2022-11-22 MED ORDER — AMIODARONE HCL IN DEXTROSE 360-4.14 MG/200ML-% IV SOLN
30.0000 mg/h | INTRAVENOUS | Status: DC
  Filled 2022-11-22: qty 200

## 2022-11-22 MED ORDER — AMIODARONE HCL IN DEXTROSE 360-4.14 MG/200ML-% IV SOLN
30.0000 mg/h | INTRAVENOUS | Status: DC
  Filled 2022-11-22 (×2): qty 200

## 2022-11-22 MED ORDER — LACTATED RINGERS IV BOLUS
500.0000 mL | Freq: Once | INTRAVENOUS | Status: AC
  Administered 2022-11-22: 500 mL via INTRAVENOUS

## 2022-11-22 MED ORDER — INSULIN ASPART 100 UNIT/ML IJ SOLN
0.0000 [IU] | INTRAMUSCULAR | Status: DC
  Administered 2022-11-22: 3 [IU] via SUBCUTANEOUS
  Administered 2022-11-22: 2 [IU] via SUBCUTANEOUS

## 2022-11-22 MED ORDER — AMIODARONE HCL IN DEXTROSE 360-4.14 MG/200ML-% IV SOLN
60.0000 mg/h | INTRAVENOUS | Status: DC
  Filled 2022-11-22: qty 200

## 2022-11-22 NOTE — Progress Notes (Addendum)
Daily Progress Note   Patient Name: [name]       Date: 11/22/2022 DOB: 01/07/1951  Age: 72 y.o. MRN#: [id number] Attending Physician: Ginnie Smart, MD Primary Care Physician: Chauncey Mann, DO Admit Date: 11/27/2022  Reason for Consultation/Follow-up: Establishing goals of care  Subjective: Medical records reviewed including progress notes, labs, imaging. Patient assessed at the bedside. She is moaning intermittently, does not open her eyes to my voice. Cortrak is out. Noted orders are in for replacement. Allowed patient to rest in order to preserve comfort. No family present during my visit.   Called patient's daughter [name] for ongoing goals of care discussions and palliative support. She shares her understanding after speaking with primary team that her mother is doing about the same. We discussed today's worsening labs and her continued encephalopathy, with update on PT session today provided as well. She understands the importance of ongoing goals of care discussions in light of the above information and high risk for continued decline.  Questions and concerns addressed. PMT will continue to support holistically.   Length of Stay: 10   Physical Exam Vitals and nursing note reviewed.  Constitutional:      General: She is sleeping. She is not in acute distress.    Appearance: She is ill-appearing.  Cardiovascular:     Rate and Rhythm: Normal rate.  Pulmonary:     Effort: Pulmonary effort is normal. No respiratory distress.  Neurological:     Mental Status: She is lethargic.  Psychiatric:        Cognition and Memory: Cognition is impaired.            Vital Signs: BP 120/75 (BP Location: Left Arm)   Pulse 85   Temp 98.9 F (37.2 C) (Oral)   Resp 18   Ht 5\' 2"   (1.575 m)   Wt 73.3 kg   SpO2 100%   BMI 29.56 kg/m  SpO2: SpO2: 100 % O2 Device: O2 Device: Room Air O2 Flow Rate: O2 Flow Rate (L/min): 3 L/min      Palliative Assessment/Data: 30% (on tube feeds)    Palliative Care Assessment & Plan   Patient Profile: 72 y.o. female  with past medical history of CAD s/p CABG, chronic combined systolic and diastolic HF (EF<20% 06/05/2022), T2DM, HTN, and HLD  admitted on 11/29/2022 s/p cardiac arrest.    Was found to have PEA -> Vfib s/p defibrillation x3, 5 rounds of epinephrine and amiodarone until ROSC was achieved. She intubated, admitted to the ICU 10/14-10/18 where she was found to have liver and renal shock. Her status was also complicated by aspiration PNA as is s/p 5 days of Unasyn.    PMT has been consulted to assist with goals of care conversation.  Assessment: Goals of care conversation Acute metabolic encephalopathy AKI on CKD 4, worsening Elevated transaminases, worsening Status post cardiac arrest  Recommendations/Plan: Continue DNR/DNI Continue current care plan and more time for outcomes No escalation or aggressive interventions such as HD, PEG, or other life support  Ongoing goals of care discussions pending clinical course Psychosocial and emotional for provided PMT continue to follow and support   Prognosis: Poor  Discharge Planning: To  Be Determined  Care plan was discussed with patient's daughter, IMTS   Total time: I spent 35 minutes in the care of the patient today in the above activities and documenting the encounter.   Deana Krock Jeni Salles, PA-C  Palliative Medicine Team Team phone # [phone]  Thank you for allowing the Palliative Medicine Team to assist in the care of this patient. Please utilize secure chat with additional questions, if there is no response within 30 minutes please call the above phone number.  Palliative Medicine Team providers are available by phone from 7am to 7pm daily and  can be reached through the team cell phone.  Should this patient require assistance outside of these hours, please call the patient's attending physician.

## 2022-11-22 NOTE — Progress Notes (Signed)
Physical Therapy Treatment Patient Details Name: [name] MRN: [id number] DOB: 09-Apr-1950 Today's Date: 11/22/2022   History of Present Illness Patient is a 72 y/o female admitted November 26, 2022 after cardiac arrest, was intubated, extubated 10/17.  MRI 10/16 ischemic injury involving cerebral cortex.  PMH - CHF/ischemic cardiomyopathy EF 25-30%, DM, CAD, CKD, MI, CABG, HTN.    PT Comments  Pt greeted supine in bed, restless with LLE off EOB. Pt continues to require total A +2 for all mobility as pt unable to follow commands and demonstrating no purposeful movement to assist with mobility. Pt requiring max A to maintain sitting balance at EOB as pt with strong posterior lean and intermittent retropulsion. Pt moaning and verbalizing throughout session however unintelligible and unable to answer simple questions this session, pt responding "hello" when telling pt goodbye at end of session. Pt awake with eyes open throughout session. Pt continues to benefit from skilled PT services to progress toward functional mobility goals.     If plan is discharge home, recommend the following: Two people to help with walking and/or transfers;Assist for transportation;Help with stairs or ramp for entrance;Two people to help with bathing/dressing/bathroom;Assistance with feeding;Assistance with cooking/housework   Can travel by private vehicle     No  Equipment Recommendations  None recommended by PT (defer to next venue)    Recommendations for Other Services OT consult;Speech consult     Precautions / Restrictions Precautions Precautions: Fall Restrictions Weight Bearing Restrictions: No     Mobility  Bed Mobility Overal bed mobility: Needs Assistance Bed Mobility: Rolling, Supine to Sit, Sit to Supine Rolling: Total assist, +2 for physical assistance, +2 for safety/equipment Sidelying to sit: +2 for physical assistance, Total assist   Sit to supine: Total assist, +2 for physical assistance, +2  for safety/equipment   General bed mobility comments: +2 total A for all aspects    Transfers                        Ambulation/Gait                   Stairs             Wheelchair Mobility     Tilt Bed    Modified Rankin (Stroke Patients Only)       Balance Overall balance assessment: Needs assistance Sitting-balance support: No upper extremity supported Sitting balance-Leahy Scale: Zero Sitting balance - Comments: Pt requires max assist for static sitting balance as pt with strong posterior lean and retropulsion, sat EOB ~5 minutes, Pt maintains cervical flexion, decreased ability to hold head upright, posterior LOB at times.                                    Cognition Arousal: Alert Behavior During Therapy: Flat affect Overall Cognitive Status: Impaired/Different from baseline                                 General Comments: pt verbalizing in moans throughout session, not responding to questions or with inteligible speech.        Exercises Other Exercises Other Exercises: unable to follow commands to complete    General Comments General comments (skin integrity, edema, etc.): Pt noted to be incontinent of uring, requires total assist for peri hygiene & changing bed linens.  Pertinent Vitals/Pain Pain Assessment Pain Assessment: Faces Faces Pain Scale: Hurts a little bit Pain Location: generalized with mobility Pain Descriptors / Indicators: Grimacing, Guarding Pain Intervention(s): Monitored during session, Limited activity within patient's tolerance    Home Living                          Prior Function            PT Goals (current goals can now be found in the care plan section) Acute Rehab PT Goals Patient Stated Goal: unable to state PT Goal Formulation: Patient unable to participate in goal setting Time For Goal Achievement: 11/30/22 Progress towards PT goals: Not  progressing toward goals - comment (cognition)    Frequency    Min 1X/week      PT Plan      Co-evaluation              AM-PAC PT "6 Clicks" Mobility   Outcome Measure  Help needed turning from your back to your side while in a flat bed without using bedrails?: Total Help needed moving from lying on your back to sitting on the side of a flat bed without using bedrails?: Total Help needed moving to and from a bed to a chair (including a wheelchair)?: Total Help needed standing up from a chair using your arms (e.g., wheelchair or bedside chair)?: Total Help needed to walk in hospital room?: Total Help needed climbing 3-5 steps with a railing? : Total 6 Click Score: 6    End of Session   Activity Tolerance: Patient tolerated treatment well Patient left: in bed;with call bell/phone within reach;with bed alarm set;Other (comment) (with bed in chair position) Nurse Communication: Mobility status PT Visit Diagnosis: Muscle weakness (generalized) (M62.81);Other symptoms and signs involving the nervous system (R29.898);Other abnormalities of gait and mobility (R26.89)     Time: 1610-9604 PT Time Calculation (min) (ACUTE ONLY): 20 min  Charges:    $Therapeutic Activity: 8-22 mins PT General Charges $$ ACUTE PT VISIT: 1 Visit                     Danni Shima R. PTA Acute Rehabilitation Services Office: [phone]   Catalina Antigua 11/22/2022, 10:25 AM

## 2022-11-22 NOTE — Progress Notes (Addendum)
Overview: [name] is a 72 y.o. with PMH of CAD s/p CABG, chronic combined systolic and diastolic HF (EF<20% 06/05/2022), T2DM, HTN, and HLD found unresponsive by family after hearing her fall, brought into the ED after cardiac arrest s/p CPR. Was found to have PEA -> Vfib s/p defibrillation x3, 5 rounds of epinephrine and amiodarone until ROSC was achieved. She intubated, admitted to the ICU 10/14-10/18 where she was found to have liver and renal shock. Her status was also complicated by aspiration PNA as is s/p 5 days of Unasyn.   Overnight: patient pulled her NG tube   Subjective: Not interactive. Encephalopathic.  Objective:   Vital signs in last 24 hours: Vitals:   11/21/22 1642 11/21/22 1947 11/22/22 0511 11/22/22 0743  BP: 127/80 123/84 129/88 120/75  Pulse: 83 82 85 85  Resp: 18 17 18 18   Temp: (!) 97.4 F (36.3 C) (!) 97.4 F (36.3 C) 97.7 F (36.5 C) 98.9 F (37.2 C)  TempSrc: Oral Oral Oral Oral  SpO2: 100% 100% 100% 100%  Weight:   73.3 kg   Height:       Supplemental O2: Room Air Last BM Date : 11/21/22 Filed Weights   11/19/22 0536 11/20/22 0500 11/22/22 0511  Weight: 75.4 kg 75.3 kg 73.3 kg   No intake or output data in the 24 hours ending 11/22/22 1501  Net IO Since Admission: 750.71 mL [11/22/22 1501]  Physical Exam  General: Laying in bed, seems in pain HENT: Does not open eyes on command, dry mouth  Lungs: bibasilar rales present, no wheezing heard, auscultation limited to anterior lungs Cardiovascular: regular rate and rhythm, good radial pulse Abdomen: bowel sounds present, soft MSK: no asymmetry noted Neuro: localizes to pain, does not obey commands   Diagnostics    Latest Ref Rng & Units 11/22/2022   10:14 AM 11/21/2022    5:03 AM 11/20/2022    6:31 AM  CBC  WBC 4.0 - 10.5 K/uL 11.4  8.1  10.2   Hemoglobin 12.0 - 15.0 g/dL 8.9  8.5  9.5   Hematocrit 36.0 - 46.0 % 27.8  26.5  29.8   Platelets 150 - 400 K/uL 145  145  163         Latest Ref Rng & Units 11/22/2022    6:41 AM 11/21/2022    5:03 AM 11/20/2022    6:31 AM  BMP  Glucose 70 - 99 mg/dL 540  981  191   BUN 8 - 23 mg/dL 478  295  621   Creatinine 0.44 - 1.00 mg/dL 3.08  6.57  8.46   Sodium 135 - 145 mmol/L 147  139  145   Potassium 3.5 - 5.1 mmol/L 4.7  4.5  4.0   Chloride 98 - 111 mmol/L 114  107  109   CO2 22 - 32 mmol/L 16  19  21    Calcium 8.9 - 10.3 mg/dL 8.8  8.2  9.1      Assessment/Plan: Principal Problem:   Cardiac arrest (HCC) Active Problems:   Ischemic cardiomyopathy   AKI (acute kidney injury) (HCC)   Pressure injury of skin   Metabolic acidosis  Acute encephalopathy 2/2 to likely anoxic brain injury Status Post PEA arrest AKI on CKD Stage IV Pt pulled feeding tube, feeding tube not in place. Replaced with NG tube and pt pulled it again overnight. In the setting of decreased feeds it is not surprising that her Cr function and LFTs are worse  today. AG increased. Team spoke to family, family elected to not replace tube feeds due to ability to be an intervention to prolong life. Spoke to nephro, limited interventions. -D5 with 3 amps of bicarb today 75/hr for 5 hrs  -NPO  -Tylenol 650 mg suppositories -Monitor BMs for any blood/changes in color  -Dc'ing famotidine 10 mg as she cannot tolerate PO intake -Bladder scans q shift.  Diarrhea - resolved Emesis Had 4 bouts of diarrhea and one bout of emesis 11/20/2022 Remains afebrile. Discontinued her laxatives and was NPO for a day. GI panel was negative. No concern for blockage on CT. Still in pain after NG tube was pulled again.  -continue holding docusate and miralax -NPO  Elevated transaminases  Worsening, likely in the setting of decreased tube feeds.  -Continue to monitor LFTs  Hypernatremia Will not be able to get FWF.  -500cc LR bolus today  -D5 with bicarb as above.  -continue holding IV lasix as above.   Acute hypoxic respiratory failure 2/2 aspiration pneumonia -  resolved Saturating at room air. Has not had a fever WBC WNL.  -Continue to monitor fever curve. -Tylenol only in cases of fever.   HFrEF CAD with hx of CABG. Cw cardiology's recommended current measures. Pt's echo shows baseline low EF at 20 %.  -Hold lasix given AKI  -Cw amiodarone, switched to IV 30mg /hr rate, appreciate pharmacy's assistance with this.   T2DM CBGs. Was hyperglycemic today, on very sensitive scale now  Normocytic anemia Anemia of chronic disease with increased ferritin on labs.   Thrombocytosis Will continue to monitor. Smear shows no platelet abnormality.   Diet: Tube feeds IVF: None VTE: Enoxaparin Code: DNR PT/OT recs: Pending Prior to Admission Living Arrangement: Home Anticipated Discharge Location: Home Barriers to Discharge: Medical Stability Dispo: Anticipated discharge in approximately 5 day(s).   [name], MD  PGY-1  [phone] After 5 pm and on weekends: Please call the on-call pager

## 2022-11-23 DIAGNOSIS — Z789 Other specified health status: Secondary | ICD-10-CM

## 2022-11-23 DIAGNOSIS — R451 Restlessness and agitation: Secondary | ICD-10-CM

## 2022-11-23 DIAGNOSIS — I469 Cardiac arrest, cause unspecified: Secondary | ICD-10-CM | POA: Diagnosis not present

## 2022-11-23 DIAGNOSIS — K7200 Acute and subacute hepatic failure without coma: Secondary | ICD-10-CM | POA: Diagnosis not present

## 2022-11-23 DIAGNOSIS — Z515 Encounter for palliative care: Secondary | ICD-10-CM | POA: Diagnosis not present

## 2022-11-23 DIAGNOSIS — Z7189 Other specified counseling: Secondary | ICD-10-CM | POA: Diagnosis not present

## 2022-11-23 DIAGNOSIS — Z66 Do not resuscitate: Secondary | ICD-10-CM

## 2022-11-23 LAB — COMPREHENSIVE METABOLIC PANEL
ALT: 536 U/L — ABNORMAL HIGH (ref 0–44)
AST: 1145 U/L — ABNORMAL HIGH (ref 15–41)
Albumin: 2.7 g/dL — ABNORMAL LOW (ref 3.5–5.0)
Alkaline Phosphatase: 188 U/L — ABNORMAL HIGH (ref 38–126)
Anion gap: 18 — ABNORMAL HIGH (ref 5–15)
BUN: 149 mg/dL — ABNORMAL HIGH (ref 8–23)
CO2: 19 mmol/L — ABNORMAL LOW (ref 22–32)
Calcium: 8.4 mg/dL — ABNORMAL LOW (ref 8.9–10.3)
Chloride: 112 mmol/L — ABNORMAL HIGH (ref 98–111)
Creatinine, Ser: 3.86 mg/dL — ABNORMAL HIGH (ref 0.44–1.00)
GFR, Estimated: 12 mL/min — ABNORMAL LOW (ref >60)
Glucose, Bld: 194 mg/dL — ABNORMAL HIGH (ref 70–99)
Potassium: 4.4 mmol/L (ref 3.5–5.1)
Sodium: 149 mmol/L — ABNORMAL HIGH (ref 135–145)
Total Bilirubin: 1.5 mg/dL — ABNORMAL HIGH (ref 0.3–1.2)
Total Protein: 6.9 g/dL (ref 6.5–8.1)

## 2022-11-23 LAB — CBC
HCT: 27.5 % — ABNORMAL LOW (ref 36.0–46.0)
Hemoglobin: 9.2 g/dL — ABNORMAL LOW (ref 12.0–15.0)
MCH: 28.7 pg (ref 26.0–34.0)
MCHC: 33.5 g/dL (ref 30.0–36.0)
MCV: 85.7 fL (ref 80.0–100.0)
Platelets: 114 10*3/uL — ABNORMAL LOW (ref 150–400)
RBC: 3.21 MIL/uL — ABNORMAL LOW (ref 3.87–5.11)
RDW: 17.6 % — ABNORMAL HIGH (ref 11.5–15.5)
WBC: 13.9 10*3/uL — ABNORMAL HIGH (ref 4.0–10.5)
nRBC: 3.2 % — ABNORMAL HIGH (ref 0.0–0.2)

## 2022-11-23 LAB — GLUCOSE, CAPILLARY: Glucose-Capillary: 196 mg/dL — ABNORMAL HIGH (ref 70–99)

## 2022-11-23 MED ORDER — LORAZEPAM 2 MG/ML PO CONC
1.0000 mg | ORAL | Status: DC | PRN
Start: 2022-11-23 — End: 2022-11-23

## 2022-11-23 MED ORDER — ACETAMINOPHEN 650 MG RE SUPP: 650.0000 mg | Freq: Four times a day (QID) | RECTAL | Status: DC | PRN

## 2022-11-23 MED ORDER — HYDROMORPHONE HCL 1 MG/ML IJ SOLN
0.5000 mg | INTRAMUSCULAR | Status: DC | PRN
  Administered 2022-11-24 (×3): 1 mg via INTRAVENOUS
  Administered 2022-11-25: 0.5 mg via INTRAVENOUS
  Administered 2022-11-25: 1 mg via INTRAVENOUS
  Administered 2022-11-25 – 2022-11-26 (×6): 0.5 mg via INTRAVENOUS
  Administered 2022-11-27 (×2): 1 mg via INTRAVENOUS
  Filled 2022-11-23: qty 1
  Filled 2022-11-23 (×2): qty 0.5
  Filled 2022-11-23: qty 1
  Filled 2022-11-23 (×5): qty 0.5
  Filled 2022-11-23 (×4): qty 1

## 2022-11-23 MED ORDER — GLYCOPYRROLATE 0.2 MG/ML IJ SOLN: 0.2000 mg | INTRAMUSCULAR | Status: DC | PRN

## 2022-11-23 MED ORDER — LORAZEPAM 1 MG PO TABS: 1.0000 mg | ORAL_TABLET | ORAL | Status: DC | PRN

## 2022-11-23 MED ORDER — LORAZEPAM 2 MG/ML IJ SOLN
1.0000 mg | INTRAMUSCULAR | Status: DC | PRN
Start: 2022-11-23 — End: 2022-11-28
  Filled 2022-11-23: qty 1

## 2022-11-23 MED ORDER — LORAZEPAM 2 MG/ML PO CONC: 1.0000 mg | ORAL | Status: DC | PRN

## 2022-11-23 MED ORDER — GLYCOPYRROLATE 1 MG PO TABS: 1.0000 mg | ORAL_TABLET | ORAL | Status: DC | PRN

## 2022-11-23 MED ORDER — POLYVINYL ALCOHOL 1.4 % OP SOLN: 1.0000 [drp] | Freq: Four times a day (QID) | OPHTHALMIC | Status: DC | PRN

## 2022-11-23 MED ORDER — ACETAMINOPHEN 325 MG PO TABS: 650.0000 mg | ORAL_TABLET | Freq: Four times a day (QID) | ORAL | Status: DC | PRN

## 2022-11-23 MED ORDER — BIOTENE DRY MOUTH MT LIQD
15.0000 mL | Freq: Two times a day (BID) | OROMUCOSAL | Status: DC
  Administered 2022-11-23 – 2022-11-27 (×9): 15 mL via TOPICAL

## 2022-11-23 MED ORDER — LORAZEPAM 2 MG/ML IJ SOLN: 1.0000 mg | INTRAMUSCULAR | Status: DC | PRN

## 2022-11-23 MED ORDER — MORPHINE SULFATE (PF) 2 MG/ML IV SOLN
1.0000 mg | INTRAVENOUS | Status: DC | PRN
Start: 2022-11-23 — End: 2022-11-23

## 2022-11-23 MED ORDER — BIOTENE DRY MOUTH MT LIQD: 15.0000 mL | OROMUCOSAL | Status: DC | PRN

## 2022-11-23 MED ORDER — LORAZEPAM 2 MG/ML IJ SOLN
1.0000 mg | Freq: Four times a day (QID) | INTRAMUSCULAR | Status: DC
  Administered 2022-11-23 – 2022-11-28 (×19): 1 mg via INTRAVENOUS
  Filled 2022-11-23 (×18): qty 1

## 2022-11-23 NOTE — TOC Progression Note (Signed)
Transition of Care [facility]) - Progression Note    Patient Details  Name: [name] MRN: [id number] Date of Birth: 1950-10-07  [facility]) CM/SW Contact  Leone Haven, RN Phone Number: 11/23/2022, 1:37 PM  Clinical Narrative:    Patient is transitioning to comfort care per MD note. TOC following.        Expected Discharge Plan and Services                                               Social Determinants of Health (SDOH) Interventions SDOH Screenings   Food Insecurity: No Food Insecurity (11/12/2022)  Recent Concern: Food Insecurity - Food Insecurity Present (09/04/2022)  Housing: Low Risk  (11/12/2022)  Transportation Needs: No Transportation Needs (11/12/2022)  Recent Concern: Transportation Needs - Unmet Transportation Needs (09/04/2022)  Utilities: Not At Risk (11/12/2022)  Alcohol Screen: Low Risk  (09/04/2022)  Depression (PHQ2-9): Low Risk  (09/04/2022)  Financial Resource Strain: Low Risk  (09/04/2022)  Physical Activity: Insufficiently Active (09/04/2022)  Social Connections: Socially Isolated (09/04/2022)  Stress: No Stress Concern Present (09/04/2022)  Tobacco Use: Low Risk  (11/12/2022)    Readmission Risk Interventions    11/13/2022    1:42 PM  Readmission Risk Prevention Plan  Transportation Screening Complete  PCP or Specialist Appt within 5-7 Days Complete  Home Care Screening Complete  Medication Review (RN CM) Referral to Pharmacy

## 2022-11-23 NOTE — Progress Notes (Addendum)
Overview: [name] is a 72 y.o. with PMH of CAD s/p CABG, chronic combined systolic and diastolic HF (EF<20% 06/05/2022), T2DM, HTN, and HLD found unresponsive by family after hearing her fall, brought into the ED after cardiac arrest s/p CPR. Was found to have PEA -> Vfib s/p defibrillation x3, 5 rounds of epinephrine and amiodarone until ROSC was achieved. She intubated, admitted to the ICU 10/14-10/18 where she was found to have liver and renal shock. Her status was also complicated by aspiration PNA as is s/p 5 days of Unasyn.   Overnight: had to be transferred as she could not get the amiodarone in her prior floor  Subjective: Not interactive. Encephalopathic.  Objective:   Vital signs in last 24 hours: Vitals:   11/22/22 2316 11/23/22 0040 11/23/22 0539 11/23/22 0735  BP: (!) 114/99 132/80 91/62 116/77  Pulse:  80 90 76  Resp: 14 18 20 12   Temp: 97.7 F (36.5 C) (!) 95.9 F (35.5 C) (!) 97.2 F (36.2 C) (!) 97.3 F (36.3 C)  TempSrc: Oral Oral Axillary Oral  SpO2: 98% 100% 98% 98%  Weight:   70.9 kg   Height:       Supplemental O2: Room Air Last BM Date : 11/22/22 Filed Weights   11/20/22 0500 11/22/22 0511 11/23/22 0539  Weight: 75.3 kg 73.3 kg 70.9 kg    Intake/Output Summary (Last 24 hours) at 11/23/2022 1047 Last data filed at 11/23/2022 8295 Gross per 24 hour  Intake 0 ml  Output 150 ml  Net -150 ml    Net IO Since Admission: 600.71 mL [11/23/22 1047]  Physical Exam  General: Laying in bed HENT: Does not open eyes on command Lungs: bibasilar rales present, no wheezing heard, auscultation limited to anterior lungs Cardiovascular: regular rate and rhythm, good radial pulse, extremities are cold to palpation Abdomen: bowel sounds present, soft MSK: no asymmetry noted Neuro: localizes to pain, does not obey commands   Diagnostics    Latest Ref Rng & Units 11/23/2022    4:09 AM 11/22/2022   10:14 AM 11/21/2022    5:03 AM  CBC  WBC 4.0 - 10.5  K/uL 13.9  11.4  8.1   Hemoglobin 12.0 - 15.0 g/dL 9.2  8.9  8.5   Hematocrit 36.0 - 46.0 % 27.5  27.8  26.5   Platelets 150 - 400 K/uL 114  145  145        Latest Ref Rng & Units 11/23/2022    4:09 AM 11/22/2022    6:41 AM 11/21/2022    5:03 AM  BMP  Glucose 70 - 99 mg/dL 621  308  657   BUN 8 - 23 mg/dL 846  962  952   Creatinine 0.44 - 1.00 mg/dL 8.41  3.24  4.01   Sodium 135 - 145 mmol/L 149  147  139   Potassium 3.5 - 5.1 mmol/L 4.4  4.7  4.5   Chloride 98 - 111 mmol/L 112  114  107   CO2 22 - 32 mmol/L 19  16  19    Calcium 8.9 - 10.3 mg/dL 8.4  8.8  8.2      Assessment/Plan: Principal Problem:   Cardiac arrest (HCC) Active Problems:   Ischemic cardiomyopathy   AKI (acute kidney injury) (HCC)   Pressure injury of skin   Metabolic acidosis   Acute liver failure without hepatic coma  Comfort care Acute encephalopathy 2/2 to likely anoxic brain injury Status Post PEA arrest AKI  on CKD Stage IV Patient had minimal improvement with bicarb and fluids ON. She continued to deteriorate. Family was contacted, discussed poor prognosis and that these measures may be prolonging her life and not giving her the comfort she may need. Not interested in hospice transfer. -Transition to comfort care today.   Diarrhea - resolved Emesis Had 4 bouts of diarrhea and one bout of emesis 11/20/2022 Remains afebrile. Discontinued her laxatives and was NPO for a day. GI panel was negative. No concern for blockage on CT. Still in pain after NG tube was pulled again.  -continue holding docusate and miralax -NPO -pt is comfort care now  Elevated transaminases  Worsening, likely in the setting of decreased tube feeds.  -Dc'ing amio   -Transitioning to comfort care  Hypernatremia Will not be able to get FWF.  -Transitioning to comfort care  Acute hypoxic respiratory failure 2/2 aspiration pneumonia - resolved Saturating at room air. Has not had a fever WBC WNL.   HFrEF CAD with hx of  CABG. Cw cardiology's recommended current measures. Pt's echo shows baseline low EF at 20 %.  -Hold lasix given AKI  -Holding amio due to worsening LFTs  -Pt transitioned to comfort care  T2DM Family does not want anything by mouth or fluids. Transitioning to comfort care   Diet: NPO IVF: None VTE: Enoxaparin Code: DNR - comfort care   [name], MD  PGY-1  [phone] After 5 pm and on weekends: Please call the on-call pager

## 2022-11-23 NOTE — Progress Notes (Signed)
Nutrition Brief Note  Chart reviewed. Pt now transitioning to comfort care.  No further nutrition interventions planned at this time.  Please re-consult as needed.   Allie Evalyne Cortopassi, RDN, LDN Clinical Nutrition  

## 2022-11-23 NOTE — Progress Notes (Signed)
Daily Progress Note   Patient Name: [name]       Date: 11/23/2022 DOB: [birth year]  Age: 72 y.o. MRN#: [id number] Attending Physician: Ginnie Smart, MD Primary Care Physician: Chauncey Mann, DO Admit Date: 10/31/2022  Reason for Consultation/Follow-up: Establishing goals of care  Subjective: I have reviewed medical records including EPIC notes, MAR, and labs. Received report from primary RN - no acute concerns.  Went to visit patient at bedside - no family/visitors present. Patient was lying in bed with eyes closed - she is restless with mitts in place. She does not verbally respond to voice/gentle touch. Signs of discomfort noted. No respiratory distress, increased work of breathing, or secretions noted.   10:55 AM Called daughter/[name] for ongoing support and GOC. She is tearful - therapeutic listening provided as she reflects on the conversation and information she just received from IMTS. [name] understands that patient's liver and kidney function continue to worsen; liver function has dramatically worsened today. She indicates she has made the decision for patient's transition to full comfort measures today.   She understands a transition to comfort measures in house and what that would entail inclusive of medications to control pain, dyspnea, agitation, nausea, and itching. We discussed stopping all unnecessary measures such as blood draws, needle sticks, oxygen, antibiotics, CBGs/insulin, cardiac monitoring, IVF, and frequent vital signs. Education provided that other non-pharmacological interventions would be utilized for holistic support and comfort such as spiritual support if requested, repositioning, music therapy, offering comfort feeds, and/or therapeutic listening.  All care would focus on how the patient is looking and feeling. [name] indicates she wants her mothers passing to be comfortable and peaceful.  Education provided on current [employer] EOL visitation. [name] does not want patient to have any visitors other than herself - she "doesn't want anyone else to see her this way;" though she notes she feels overwhelmed and may not visit. She request to be notified when patient passes, which staff will do.   Briefly reviewed option for residential hospice transfer - [name] does not wish for patient to be moved at this time.  All questions and concerns addressed. Encouraged to call with questions and/or concerns. PMT card provided via text per her request.  Requested RN remove mitts once patient is more calm and relaxed with comfort medications.  Length of Stay: 11  Current Medications: Scheduled Meds:   Chlorhexidine Gluconate Cloth  6 each Topical Daily   enoxaparin (LOVENOX) injection  30 mg Subcutaneous Q24H   insulin aspart  0-6 Units Subcutaneous Q4H   mouth rinse  15 mL Mouth Rinse 4 times per day    Continuous Infusions:  amiodarone      PRN Meds: [DISCONTINUED] acetaminophen **OR** [DISCONTINUED] acetaminophen (TYLENOL) oral liquid 160 mg/5 mL **OR** acetaminophen, HYDROmorphone (DILAUDID) injection, ondansetron (ZOFRAN) IV  Physical Exam Vitals and nursing note reviewed.  Constitutional:      General: She is not in acute distress.    Appearance: She is ill-appearing.  Pulmonary:     Effort: No respiratory distress.  Skin:    General: Skin is warm and dry.  Neurological:     Mental Status: She is lethargic.     Motor: Weakness present.  Psychiatric:     Comments: restless             Vital Signs: BP 116/77 (BP Location: Left Arm)   Pulse 76   Temp (!) 97.3 F (36.3 C) (Oral)   Resp 12   Ht 5\' 2"  (1.575 m)   Wt 70.9 kg   SpO2 98%   BMI 28.59 kg/m  SpO2: SpO2: 98 % O2 Device: O2 Device: Room Air O2 Flow Rate: O2  Flow Rate (L/min): 3 L/min  Intake/output summary:  Intake/Output Summary (Last 24 hours) at 11/23/2022 1053 Last data filed at 11/23/2022 4098 Gross per 24 hour  Intake 0 ml  Output 150 ml  Net -150 ml   LBM: Last BM Date : 11/22/22 Baseline Weight: Weight: 80 kg Most recent weight: Weight: 70.9 kg       Palliative Assessment/Data: PPS 10%      Patient Active Problem List   Diagnosis Date Noted   Acute liver failure without hepatic coma 11/23/2022   Metabolic acidosis 11/22/2022   Pressure injury of skin 11/20/2022   Cardiac arrest (HCC) 11/04/2022   Heart failure with reduced ejection fraction (HCC) 09/04/2022   Noncompliance w/medication treatment due to intermit use of medication 07/27/2022   Acute exacerbation of CHF (congestive heart failure) (HCC) 07/26/2022   History of COVID-19 11/19/2019   CKD (chronic kidney disease) stage 4, GFR 15-29 ml/min (HCC) 09/24/2019   Osteopenia of neck of left femur 12/31/2017   Left bundle branch block    Need for immunization against influenza 11/22/2016   AKI (acute kidney injury) (HCC) 08/15/2016   Chronic systolic heart failure (HCC)    Anemia 08/24/2014   Dyslipidemia 04/29/2014   Preventative health care 04/29/2014   Ischemic cardiomyopathy 03/23/2014   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 female  with past medical history of CAD s/p CABG, chronic combined systolic and diastolic HF (EF<20% 06/05/2022), T2DM, HTN, and HLD  admitted on 11/05/2022 s/p cardiac arrest.    Was found to have PEA -> Vfib s/p defibrillation x3, 5 rounds of epinephrine and  amiodarone until ROSC was achieved. She intubated, admitted to the ICU 10/14-10/18 where she was found to have liver and renal shock. Her status was  also complicated by aspiration PNA as is s/p 5 days of Unasyn.   Assessment: Principal Problem:   Cardiac arrest (HCC) Active Problems:   Ischemic cardiomyopathy   AKI (acute kidney injury) (HCC)   Pressure injury of skin   Metabolic acidosis   Acute liver failure without hepatic coma   Terminal care  Recommendations/Plan: Continue full comfort measures Continue DNR/DNI as previously documented - durable DNR form completed and placed in shadow chart. Copy was made and will be scanned into Vynca/ACP tab Daughter does not want patient transferred to residential hospice at this time - anticipate hospital death Added orders for EOL symptom management and to reflect full comfort measures, as well as discontinued orders that were not focused on comfort Unrestricted visitation orders were placed per current [place] EOL visitation policy  Nursing to provide frequent assessments and administer PRN medications as clinically necessary to ensure EOL comfort PMT will continue to follow and support holistically  Symptom Management Dilaudid PRN pain/dyspnea/increased work of breathing/RR>25 Tylenol PRN pain/fever Biotin twice daily Benadryl PRN itching Robinul PRN secretions Haldol PRN agitation/delirium Scheduled ativan q6h; PRN doses for breakthrough anxiety/seizure/sleep/distress Zofran PRN nausea/vomiting Liquifilm Tears PRN dry eye   Goals of Care and Additional Recommendations: Limitations on Scope of Treatment: Full Comfort Care  Code Status:    Code Status Orders  (From admission, onward)           Start     Ordered   11/15/22 1656  Do not attempt resuscitation (DNR)- Limited -Do Not Intubate (DNI)  (Code Status)  Continuous       Question Answer Comment  If pulseless and not breathing No CPR or chest compressions.   In  Pre-Arrest Conditions (Patient Is Breathing and Has A Pulse) Do not intubate. Provide all appropriate non-invasive medical interventions. Avoid ICU transfer unless indicated or required.   Consent: Discussion documented in EHR or advanced directives reviewed      11/15/22 1655           Code Status History     Date Active Date Inactive Code Status Order ID Comments User Context   11/23/2022 0853 11/15/2022 1655 Do not attempt resuscitation (DNR) PRE-ARREST INTERVENTIONS DESIRED [id number]  Cheri Fowler, MD ED   07/26/2022 1607 07/30/2022 1717 DNR [id number]  Modena Slater, DO ED   09/12/2017 2047 09/17/2017 1924 Full Code [id number]  Lanelle Bal, MD ED   08/04/2016 1655 08/08/2016 1949 Full Code [id number]  Fuller Plan, MD Inpatient   03/01/2014 1613 03/06/2014 2100 Full Code [id number]  Rowe Clack, PA-C Inpatient   02/26/2014 1007 03/01/2014 1613 Full Code [id number]  Lyn Records, MD Inpatient   02/22/2014 1013 02/26/2014 1007 Full Code [id number]  Christen Bame, MD Inpatient      Advance Directive Documentation    Flowsheet Row Most Recent Value  Type of Advance Directive Living will  Pre-existing out of facility DNR order (yellow form or pink MOST form) --  "MOST" Form in Place? --       Prognosis:  < 2 weeks  Discharge Planning: Anticipated Hospital Death  Care plan was discussed with primary RN, IMTS, patient's daughter  Thank you for allowing the Palliative Medicine Team to assist in the care of this patient.     Haskel Khan, NP  Please contact Palliative Medicine Team phone at [phone] for questions and concerns.   *Portions of this note are a verbal dictation therefore any spelling and/or grammatical errors are due to the "Dragon  Medical One" system interpretation.

## 2022-11-23 NOTE — Plan of Care (Signed)
  Problem: Metabolic: Goal: Ability to maintain appropriate glucose levels will improve Outcome: Progressing   Problem: Skin Integrity: Goal: Risk for impaired skin integrity will decrease Outcome: Progressing   Problem: Tissue Perfusion: Goal: Adequacy of tissue perfusion will improve Outcome: Progressing   Problem: Clinical Measurements: Goal: Will remain free from infection Outcome: Progressing   Problem: Pain Managment: Goal: General experience of comfort will improve Outcome: Progressing   Problem: Safety: Goal: Ability to remain free from injury will improve Outcome: Progressing

## 2022-11-23 NOTE — Plan of Care (Signed)
  Problem: Skin Integrity: Goal: Risk for impaired skin integrity will decrease Outcome: Progressing   Problem: Activity: Goal: Risk for activity intolerance will decrease Outcome: Progressing   Problem: Safety: Goal: Non-violent Restraint(s) Outcome: Progressing

## 2022-11-24 DIAGNOSIS — Z515 Encounter for palliative care: Secondary | ICD-10-CM | POA: Diagnosis not present

## 2022-11-24 DIAGNOSIS — I469 Cardiac arrest, cause unspecified: Secondary | ICD-10-CM | POA: Diagnosis not present

## 2022-11-24 DIAGNOSIS — Z66 Do not resuscitate: Secondary | ICD-10-CM | POA: Diagnosis not present

## 2022-11-24 DIAGNOSIS — Z789 Other specified health status: Secondary | ICD-10-CM | POA: Diagnosis not present

## 2022-11-24 NOTE — Plan of Care (Signed)
Care plan reviewed.

## 2022-11-24 NOTE — Progress Notes (Signed)
Hospital day#12 Subjective:   Summary: [name] is a 72 yo [name] is a 72 y.o. with PMH of CAD s/p CABG, chronic combined systolic and diastolic HF (EF<20% 06/05/2022), T2DM, HTN, and HLD found unresponsive by family after hearing her fall, brought into the ED after cardiac arrest s/p CPR with a rapidly declining hospital course who was transitioned to comfort care measures by [facility] on 10/25   Overnight Events: none. No family or visitors present. No mitts present on the patient.   Objective:  Vital signs in last 24 hours: Vitals:   11/23/22 0539 11/23/22 0735 11/24/22 0100 11/24/22 0753  BP: 91/62 116/77 (!) 139/53 (!) 144/76  Pulse: 90 76 64 73  Resp: 20 12 13 12   Temp: (!) 97.2 F (36.2 C) (!) 97.3 F (36.3 C) (!) 97.4 F (36.3 C) (!) 97 F (36.1 C)  TempSrc: Axillary Oral Axillary Axillary  SpO2: 98% 98%  90%  Weight: 70.9 kg     Height:       Physical Exam:  Constitutional: chronically ill appearing woman, resting comfortably in bed without signs of pain or restlessness. No respiratory distress or oral secretions present.     Intake/Output Summary (Last 24 hours) at 11/24/2022 1342 Last data filed at 11/24/2022 0609 Gross per 24 hour  Intake 0 ml  Output 925 ml  Net -925 ml   Net IO Since Admission: -324.29 mL [11/24/22 1342]  Pertinent Labs:    Latest Ref Rng & Units 11/23/2022    4:09 AM 11/22/2022   10:14 AM 11/21/2022    5:03 AM  CBC  WBC 4.0 - 10.5 K/uL 13.9  11.4  8.1   Hemoglobin 12.0 - 15.0 g/dL 9.2  8.9  8.5   Hematocrit 36.0 - 46.0 % 27.5  27.8  26.5   Platelets 150 - 400 K/uL 114  145  145        Latest Ref Rng & Units 11/23/2022    4:09 AM 11/22/2022    6:41 AM 11/22/2022    6:40 AM  CMP  Glucose 70 - 99 mg/dL 409  811    BUN 8 - 23 mg/dL 914  782    Creatinine 0.44 - 1.00 mg/dL 9.56  2.13    Sodium 086 - 145 mmol/L 149  147    Potassium 3.5 - 5.1 mmol/L 4.4  4.7    Chloride 98 - 111 mmol/L 112  114    CO2 22 -  32 mmol/L 19  16    Calcium 8.9 - 10.3 mg/dL 8.4  8.8    Total Protein 6.5 - 8.1 g/dL 6.9   7.6   Total Bilirubin 0.3 - 1.2 mg/dL 1.5   1.1   Alkaline Phos 38 - 126 U/L 188   154   AST 15 - 41 U/L 1,145   169   ALT 0 - 44 U/L 536   134     Imaging: No results found.  Assessment/Plan:   Principal Problem:   Cardiac arrest [facility]) Active Problems:   Ischemic cardiomyopathy   AKI (acute kidney injury) (HCC)   Pressure injury of skin   Metabolic acidosis   Acute liver failure without hepatic coma   Comfort care measures: Appreciate palliative care recommendations and support to [name] and her family members.  - DNI/DNR - No inpatient hospice at this time - Symptom management per palliative care comfort care protocol Dilaudid PRN pain/dyspnea/increased work of breathing/RR>25; required 2 doses  overnight Scheduled ativan q6h; PRN doses for breakthrough anxiety/seizure/sleep/distress; required 3 doses today Tylenol PRN pain/fever Biotin twice daily Benadryl PRN itching Robinul PRN secretions Haldol PRN agitation/delirium Zofran PRN nausea/vomiting Liquifilm Tears PRN dry eye  Dispo: Anticipate in-hospital death for this patient in comfort care measures.  Family Update: Communicated with daughter and Linda Hedges today, 10/26.  Morene Crocker, MD Internal Medicine Resident PGY-2 Please contact the on call pager after 5 pm and on weekends at [phone].

## 2022-11-24 NOTE — Progress Notes (Signed)
Daily Progress Note   Patient Name: [name]       Date: 11/24/2022 DOB: [birth year]  Age: 72 y.o. MRN#: [id number] Attending Physician: Ginnie Smart, MD Primary Care Physician: Chauncey Mann, DO Admit Date: 11/24/2022  Reason for Consultation/Follow-up: Non pain symptom management, Pain control, Psychosocial/spiritual support, and Terminal Care  Subjective: I have reviewed medical records including EPIC notes, MAR, and labs. Received report from primary RN - no acute concerns. RN reports patient has been lethargic this morning - minimally arousable.   Went to visit patient at bedside - no family/visitors present. Patient was asleep - I did not attempt to wake her to preserve comfort. She appears more relaxed and calm compared to yesterday - mitts have been removed. No signs or non-verbal gestures of pain or discomfort noted. No respiratory distress, increased work of breathing, or secretions noted.   9:28 AM Called patient's daughter/[name] - emotional support provided. Provided updates per my and RN assessment from today. She continues to wish for patient to remain in house for EOL care.  All questions and concerns addressed. Encouraged to call with questions and/or concerns. PMT card again provided via text per her request.  Length of Stay: 12  Current Medications: Scheduled Meds:   antiseptic oral rinse  15 mL Topical BID   LORazepam  1 mg Intravenous Q6H    Continuous Infusions:   PRN Meds: acetaminophen **OR** acetaminophen, glycopyrrolate **OR** glycopyrrolate **OR** glycopyrrolate, HYDROmorphone (DILAUDID) injection, LORazepam **OR** LORazepam **OR** LORazepam, ondansetron (ZOFRAN) IV, polyvinyl alcohol  Physical Exam Vitals and nursing note reviewed.   Constitutional:      General: She is not in acute distress.    Appearance: She is ill-appearing.  Pulmonary:     Effort: No respiratory distress.  Skin:    General: Skin is warm and dry.  Neurological:     Mental Status: She is lethargic.     Motor: Weakness present.             Vital Signs: BP (!) 144/76 (BP Location: Left Arm)   Pulse 73   Temp (!) 97 F (36.1 C) (Axillary)   Resp 12   Ht 5\' 2"  (1.575 m)   Wt 70.9 kg   SpO2 90%   BMI 28.59 kg/m  SpO2: SpO2: 90 % O2 Device: O2 Device:  Room Air O2 Flow Rate: O2 Flow Rate (L/min): 3 L/min  Intake/output summary:  Intake/Output Summary (Last 24 hours) at 11/24/2022 0839 Last data filed at 11/24/2022 5409 Gross per 24 hour  Intake 0 ml  Output 925 ml  Net -925 ml   LBM: Last BM Date : 11/22/22 Baseline Weight: Weight: 80 kg Most recent weight: Weight: 70.9 kg       Palliative Assessment/Data: PPS 10%      Patient Active Problem List   Diagnosis Date Noted   Acute liver failure without hepatic coma 11/23/2022   Metabolic acidosis 11/22/2022   Pressure injury of skin 11/20/2022   Cardiac arrest (HCC) 11/17/2022   Heart failure with reduced ejection fraction (HCC) 09/04/2022   Noncompliance w/medication treatment due to intermit use of medication 07/27/2022   Acute exacerbation of CHF (congestive heart failure) (HCC) 07/26/2022   History of COVID-19 11/19/2019   CKD (chronic kidney disease) stage 4, GFR 15-29 ml/min (HCC) 09/24/2019   Osteopenia of neck of left femur 12/31/2017   Left bundle branch block    Need for immunization against influenza 11/22/2016   AKI (acute kidney injury) (HCC) 08/15/2016   Chronic systolic heart failure (HCC)    Anemia 08/24/2014   Dyslipidemia 04/29/2014   Preventative health care 04/29/2014   Ischemic cardiomyopathy 03/23/2014   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 female  with past medical history of CAD s/p CABG, chronic combined systolic and diastolic HF (EF<20% 06/05/2022), T2DM, HTN, and HLD  admitted on 2022/11/17 s/p cardiac arrest.    Was found to have PEA -> Vfib s/p defibrillation x3, 5 rounds of epinephrine and amiodarone until ROSC was achieved. She intubated, admitted to the ICU 10/14-10/18 where she was found to have liver and renal shock. Her status was also complicated by aspiration PNA as is s/p 5 days of Unasyn.   Assessment: Principal Problem:   Cardiac arrest [facility]) Active Problems:   Ischemic cardiomyopathy   AKI (acute kidney injury) (HCC)   Pressure injury of skin   Metabolic acidosis   Acute liver failure without hepatic coma   Terminal care  Recommendations/Plan: Continue full comfort measures Continue DNR/DNI as previously documented Daughter/HCPOA wishes for patient to remain in house for EOL care - anticipate hospital death Continue current comfort focused medication regimen - no changes PMT will continue to follow and support holistically  Symptom Management Dilaudid PRN pain/dyspnea/increased work of breathing/RR>25 Tylenol PRN pain/fever Biotin twice daily Benadryl PRN itching Robinul PRN secretions Haldol PRN agitation/delirium Scheduled ativan q6h; PRN doses for breakthrough anxiety/seizure/sleep/distress Zofran PRN nausea/vomiting Liquifilm Tears PRN dry eye  Goals of Care and Additional Recommendations: Limitations on Scope of Treatment: Full Comfort Care  Code Status:    Code Status Orders  (From admission, onward)           Start     Ordered   11/23/22 1056  Do not attempt resuscitation (DNR) - Comfort care  Continuous        Question Answer Comment  If patient has no pulse and is not breathing Do Not Attempt Resuscitation   In Pre-Arrest Conditions (  Patient Is Breathing and Has a Pulse) Provide comfort measures. Relieve any mechanical airway obstruction. Avoid transfer unless required for comfort.   Consent: Discussion documented in EHR or advanced directives reviewed      11/23/22 1059           Code Status History     Date Active Date Inactive Code Status Order ID Comments User Context   11/15/2022 1655 11/23/2022 1059 Limited: Do not attempt resuscitation (DNR) -DNR-LIMITED -Do Not Intubate/DNI  [id number]  Cristopher Peru, PA-C Inpatient   11/26/2022 0853 11/15/2022 1655 Do not attempt resuscitation (DNR) PRE-ARREST INTERVENTIONS DESIRED [id number]  Cheri Fowler, MD ED   07/26/2022 1607 07/30/2022 1717 DNR [id number]  Modena Slater, DO ED   09/12/2017 2047 09/17/2017 1924 Full Code [id number]  Lanelle Bal, MD ED   08/04/2016 1655 08/08/2016 1949 Full Code [id number]  Fuller Plan, MD Inpatient   03/01/2014 1613 03/06/2014 2100 Full Code [id number]  Rowe Clack, PA-C Inpatient   02/26/2014 1007 03/01/2014 1613 Full Code [id number]  Lyn Records, MD Inpatient   02/22/2014 1013 02/26/2014 1007 Full Code [id number]  Christen Bame, MD Inpatient      Advance Directive Documentation    Flowsheet Row Most Recent Value  Type of Advance Directive Living will  Pre-existing out of facility DNR order (yellow form or pink MOST form) --  "MOST" Form in Place? --       Prognosis:  Days  Discharge Planning: Anticipated Hospital Death  Care plan was discussed with primary RN, patient's daughter   Thank you for allowing the Palliative Medicine Team to assist in the care of this patient.     Haskel Khan, NP  Please contact Palliative Medicine Team phone at [phone] for questions and concerns.   *Portions of this note are a verbal dictation therefore any spelling and/or grammatical errors are due to the  "Dragon Medical One" system interpretation.

## 2022-11-25 DIAGNOSIS — Z789 Other specified health status: Secondary | ICD-10-CM | POA: Diagnosis not present

## 2022-11-25 DIAGNOSIS — Z515 Encounter for palliative care: Secondary | ICD-10-CM | POA: Diagnosis not present

## 2022-11-25 DIAGNOSIS — Z66 Do not resuscitate: Secondary | ICD-10-CM | POA: Diagnosis not present

## 2022-11-25 DIAGNOSIS — I469 Cardiac arrest, cause unspecified: Secondary | ICD-10-CM | POA: Diagnosis not present

## 2022-11-25 NOTE — Progress Notes (Signed)
Hospital day#13 Subjective:   Summary: [name] is a 72 yo [name] is a 72 y.o. with PMH of CAD s/p CABG, chronic combined systolic and diastolic HF (EF<20% 06/05/2022), T2DM, HTN, and HLD found unresponsive by family after hearing her fall, brought into the ED after cardiac arrest s/p CPR with a rapidly declining hospital course who was transitioned to comfort care measures by [facility] on 10/25  No changes overnight  Objective:  Vital signs in last 24 hours: Vitals:   11/24/22 0100 11/24/22 0753 11/24/22 2000 11/25/22 0743  BP: (!) 139/53 (!) 144/76 128/80 123/73  Pulse: 64 73 62 60  Resp: 13 12  11   Temp: (!) 97.4 F (36.3 C) (!) 97 F (36.1 C) (!) 97.3 F (36.3 C)   TempSrc: Axillary Axillary Axillary   SpO2:  90% 92% 100%  Weight:      Height:       Physical Exam:  Constitutional: Resting comfortably in bed without signs of pain or restlessness. No respiratory distress notes. No signs of pain or restlessness.    Intake/Output Summary (Last 24 hours) at 11/25/2022 1338 Last data filed at 11/25/2022 1000 Gross per 24 hour  Intake --  Output 1450 ml  Net -1450 ml   Net IO Since Admission: -1,774.29 mL [11/25/22 1338]  Pertinent Labs:    Latest Ref Rng & Units 11/23/2022    4:09 AM 11/22/2022   10:14 AM 11/21/2022    5:03 AM  CBC  WBC 4.0 - 10.5 K/uL 13.9  11.4  8.1   Hemoglobin 12.0 - 15.0 g/dL 9.2  8.9  8.5   Hematocrit 36.0 - 46.0 % 27.5  27.8  26.5   Platelets 150 - 400 K/uL 114  145  145        Latest Ref Rng & Units 11/23/2022    4:09 AM 11/22/2022    6:41 AM 11/22/2022    6:40 AM  CMP  Glucose 70 - 99 mg/dL 409  811    BUN 8 - 23 mg/dL 914  782    Creatinine 0.44 - 1.00 mg/dL 9.56  2.13    Sodium 086 - 145 mmol/L 149  147    Potassium 3.5 - 5.1 mmol/L 4.4  4.7    Chloride 98 - 111 mmol/L 112  114    CO2 22 - 32 mmol/L 19  16    Calcium 8.9 - 10.3 mg/dL 8.4  8.8    Total Protein 6.5 - 8.1 g/dL 6.9   7.6   Total Bilirubin 0.3 -  1.2 mg/dL 1.5   1.1   Alkaline Phos 38 - 126 U/L 188   154   AST 15 - 41 U/L 1,145   169   ALT 0 - 44 U/L 536   134     Imaging: No results found.  Assessment/Plan:   Principal Problem:   Cardiac arrest [facility]) Active Problems:   Ischemic cardiomyopathy   AKI (acute kidney injury) (HCC)   Pressure injury of skin   Metabolic acidosis   Acute liver failure without hepatic coma   Comfort care measures: Appreciate palliative care recommendations and support to [name] and her family members.  - DNI/DNR - No inpatient hospice at this time - Symptom management per palliative care comfort care protocol Dilaudid PRN pain/dyspnea/increased work of breathing/RR>25; required 2 doses overnight Scheduled ativan q6h; PRN doses for breakthrough anxiety/seizure/sleep/distress; required 3 doses today Tylenol PRN pain/fever Biotin twice daily Benadryl PRN  itching Robinul PRN secretions Haldol PRN agitation/delirium Zofran PRN nausea/vomiting Liquifilm Tears PRN dry eye  Dispo: Anticipate in-hospital death for this patient in comfort care measures.  Family Update: Attempted communication with [name] and [name] today, 10/27, without success. Will attempt again later or tomorrow AM.  Morene Crocker, MD Internal Medicine Resident PGY-2 Please contact the on call pager after 5 pm and on weekends at [phone].

## 2022-11-25 NOTE — Progress Notes (Signed)
Daily Progress Note   Patient Name: [name]       Date: 11/25/2022 DOB: July 18, 1950  Age: 72 y.o. MRN#: [id number] Attending Physician: Ginnie Smart, MD Primary Care Physician: Chauncey Mann, DO Admit Date: 11/19/2022  Reason for Consultation/Follow-up: Non pain symptom management, Pain control, Psychosocial/spiritual support, and Terminal Care  Subjective: I have reviewed medical records including EPIC notes, MAR, and labs. Received report from primary RN - no acute concerns. RN states that external catheter is not keeping patient dry requiring frequent linen changes - will order foley placement for EOL comfort.  Went to visit patient at bedside - no family/visitors present. Patient was lying in bed  unresponsive. No signs or non-verbal gestures of pain or discomfort noted. No respiratory distress, increased work of breathing, or secretions noted. 30 and 25 second periods of apnea noted.  Length of Stay: 13  Current Medications: Scheduled Meds:   antiseptic oral rinse  15 mL Topical BID   LORazepam  1 mg Intravenous Q6H    Continuous Infusions:   PRN Meds: acetaminophen **OR** acetaminophen, glycopyrrolate **OR** glycopyrrolate **OR** glycopyrrolate, HYDROmorphone (DILAUDID) injection, LORazepam **OR** LORazepam **OR** LORazepam, ondansetron (ZOFRAN) IV, polyvinyl alcohol  Physical Exam Vitals and nursing note reviewed.  Constitutional:      General: She is not in acute distress.    Appearance: She is ill-appearing.  Pulmonary:     Effort: No respiratory distress.  Skin:    General: Skin is warm and dry.  Neurological:     Mental Status: She is unresponsive.     Motor: Weakness present.             Vital Signs: BP 123/73 (BP Location: Left Arm)   Pulse 60    Temp (!) 97.3 F (36.3 C) (Axillary)   Resp 11   Ht 5\' 2"  (1.575 m)   Wt 70.9 kg   SpO2 100%   BMI 28.59 kg/m  SpO2: SpO2: 100 % O2 Device: O2 Device: Room Air O2 Flow Rate: O2 Flow Rate (L/min): 3 L/min  Intake/output summary:  Intake/Output Summary (Last 24 hours) at 11/25/2022 0905 Last data filed at 11/25/2022 0600 Gross per 24 hour  Intake --  Output 1350 ml  Net -1350 ml   LBM: Last BM Date : 11/22/22 Baseline Weight: Weight: 80 kg Most recent  weight: Weight: 70.9 kg       Palliative Assessment/Data: PPS 10%      Patient Active Problem List   Diagnosis Date Noted   Acute liver failure without hepatic coma 11/23/2022   Metabolic acidosis 11/22/2022   Pressure injury of skin 11/20/2022   Cardiac arrest (HCC) 2022/12/06   Heart failure with reduced ejection fraction (HCC) 09/04/2022   Noncompliance w/medication treatment due to intermit use of medication 07/27/2022   Acute exacerbation of CHF (congestive heart failure) (HCC) 07/26/2022   History of COVID-19 11/19/2019   CKD (chronic kidney disease) stage 4, GFR 15-29 ml/min (HCC) 09/24/2019   Osteopenia of neck of left femur 12/31/2017   Left bundle branch block    Need for immunization against influenza 11/22/2016   AKI (acute kidney injury) (HCC) 08/15/2016   Chronic systolic heart failure (HCC)    Anemia 08/24/2014   Dyslipidemia 04/29/2014   Preventative health care 04/29/2014   Ischemic cardiomyopathy 03/23/2014   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 female  with past medical history of CAD s/p CABG, chronic combined systolic  and diastolic HF (EF<20% 06/05/2022), T2DM, HTN, and HLD  admitted on 12-06-22 s/p cardiac arrest.    Was found to have PEA -> Vfib s/p defibrillation x3, 5 rounds of epinephrine and amiodarone until ROSC was achieved. She intubated, admitted to the ICU 10/14-10/18 where she was found to have liver and renal shock. Her status was also complicated by aspiration PNA as is s/p 5 days of Unasyn.   Assessment: Principal Problem:   Cardiac arrest [facility]) Active Problems:   Ischemic cardiomyopathy   AKI (acute kidney injury) (HCC)   Pressure injury of skin   Metabolic acidosis   Acute liver failure without hepatic coma   Terminal care  Recommendations/Plan: Continue full comfort measures Continue DNR/DNI as previously documented Daughter/HCPOA wishes for patient to remain in house for EOL care - anticipate hospital death Continue current comfort focused medication regimen - no changes Foley catheter for EOL comfort PMT will continue to follow and support holistically   Symptom Management Dilaudid PRN pain/dyspnea/increased work of breathing/RR>25 Tylenol PRN pain/fever Biotin twice daily Benadryl PRN itching Robinul PRN secretions Haldol PRN agitation/delirium Scheduled ativan q6h; PRN doses for breakthrough anxiety/seizure/sleep/distress Zofran PRN nausea/vomiting Liquifilm Tears PRN dry eye  Goals of Care and Additional Recommendations: Limitations on Scope of Treatment: Full Comfort Care  Code Status:    Code Status Orders  (From admission, onward)           Start     Ordered   11/23/22 1056  Do not attempt resuscitation (DNR) - Comfort care  Continuous       Question Answer Comment  If patient has no pulse and is not breathing Do Not Attempt Resuscitation   In Pre-Arrest Conditions (Patient Is Breathing and Has a Pulse) Provide comfort measures. Relieve any mechanical airway obstruction. Avoid transfer unless required for comfort.   Consent: Discussion documented in EHR  or advanced directives reviewed      11/23/22 1059           Code Status History  Date Active Date Inactive Code Status Order ID Comments User Context   11/15/2022 1655 11/23/2022 1059 Limited: Do not attempt resuscitation (DNR) -DNR-LIMITED -Do Not Intubate/DNI  [id number]  Cristopher Peru, PA-C Inpatient   11/09/2022 0853 11/15/2022 1655 Do not attempt resuscitation (DNR) PRE-ARREST INTERVENTIONS DESIRED [id number]  Cheri Fowler, MD ED   07/26/2022 1607 07/30/2022 1717 DNR [id number]  Modena Slater, DO ED   09/12/2017 2047 09/17/2017 1924 Full Code [id number]  Lanelle Bal, MD ED   08/04/2016 1655 08/08/2016 1949 Full Code [id number]  Fuller Plan, MD Inpatient   03/01/2014 1613 03/06/2014 2100 Full Code [id number]  Rowe Clack, PA-C Inpatient   02/26/2014 1007 03/01/2014 1613 Full Code [id number]  Lyn Records, MD Inpatient   02/22/2014 1013 02/26/2014 1007 Full Code [id number]  Christen Bame, MD Inpatient      Advance Directive Documentation    Flowsheet Row Most Recent Value  Type of Advance Directive Living will  Pre-existing out of facility DNR order (yellow form or pink MOST form) --  "MOST" Form in Place? --       Prognosis:  Hours - Days  Discharge Planning: Anticipated Hospital Death  Care plan was discussed with primary RN  Thank you for allowing the Palliative Medicine Team to assist in the care of this patient.     Haskel Khan, NP  Please contact Palliative Medicine Team phone at [phone] for questions and concerns.   *Portions of this note are a verbal dictation therefore any spelling and/or grammatical errors are due to the "Dragon Medical One" system interpretation.

## 2022-11-25 NOTE — Plan of Care (Signed)
  Problem: Clinical Measurements: Goal: Will remain free from infection Outcome: Progressing   Problem: Coping: Goal: Level of anxiety will decrease Outcome: Progressing   Problem: Elimination: Goal: Will not experience complications related to urinary retention Outcome: Progressing   Problem: Pain Managment: Goal: General experience of comfort will improve Outcome: Progressing   

## 2022-11-26 DIAGNOSIS — Z515 Encounter for palliative care: Secondary | ICD-10-CM | POA: Diagnosis not present

## 2022-11-26 DIAGNOSIS — N179 Acute kidney failure, unspecified: Secondary | ICD-10-CM | POA: Diagnosis not present

## 2022-11-26 DIAGNOSIS — I469 Cardiac arrest, cause unspecified: Secondary | ICD-10-CM | POA: Diagnosis not present

## 2022-11-26 DIAGNOSIS — Z7189 Other specified counseling: Secondary | ICD-10-CM | POA: Diagnosis not present

## 2022-11-26 DIAGNOSIS — K7200 Acute and subacute hepatic failure without coma: Secondary | ICD-10-CM

## 2022-11-26 NOTE — Plan of Care (Signed)
  Problem: Pain Managment: Goal: General experience of comfort will improve Outcome: Progressing   Problem: Safety: Goal: Ability to remain free from injury will improve Outcome: Progressing   Problem: Pain Management: Goal: Satisfaction with pain management regimen will improve Outcome: Progressing

## 2022-11-26 NOTE — Progress Notes (Signed)
Daily Progress Note   Patient Name: [name]       Date: 11/26/2022 DOB: [birth year]  Age: 72 y.o. MRN#: [id number] Attending Physician: Ginnie Smart, MD Primary Care Physician: Chauncey Mann, DO Admit Date: 11/04/2022  Reason for Consultation/Follow-up: Non pain symptom management, Pain control, Psychosocial/spiritual support, and Terminal Care  Subjective: I have reviewed medical records including EPIC notes, MAR, and labs. Patient assessed at the bedside and she remains comfortable on scheduled IV ativan. No signs of distress or pain. She has received 4 doses PRN IV dilaudid in the past 24 hours. Discussed with RN at the bedside. No family present during my visit.  Length of Stay: 14  Physical Exam Vitals and nursing note reviewed.  Constitutional:      General: She is not in acute distress.    Appearance: She is ill-appearing.  Pulmonary:     Effort: No respiratory distress.  Skin:    General: Skin is warm and dry.  Neurological:     Mental Status: She is unresponsive.        Palliative Assessment/Data: PPS 10%    Palliative Care Assessment & Plan   Patient Profile: 72 y.o. female  with past medical history of CAD s/p CABG, chronic combined systolic and diastolic HF (EF<20% 06/05/2022), T2DM, HTN, and HLD  admitted on 11/01/2022 s/p cardiac arrest.    Was found to have PEA -> Vfib s/p defibrillation x3, 5 rounds of epinephrine and amiodarone until ROSC was achieved. She intubated, admitted to the ICU 10/14-10/18 where she was found to have liver and renal shock. Her status was also complicated by aspiration PNA as is s/p 5 days of Unasyn.   Assessment: Principal Problem:   Cardiac arrest [facility]) Active Problems:   Ischemic cardiomyopathy   AKI (acute kidney  injury) (HCC)   Pressure injury of skin   Metabolic acidosis   Acute liver failure without hepatic coma   Terminal care  Recommendations/Plan: Continue full comfort measures, no adjustments required today Continue DNR/DNI PMT will continue to follow and support holistically   Symptom Management Dilaudid PRN pain/dyspnea/increased work of breathing/RR>25 Tylenol PRN pain/fever Biotin twice daily Benadryl PRN itching Robinul PRN secretions Haldol PRN agitation/delirium Scheduled ativan q6h; PRN doses for breakthrough anxiety/seizure/sleep/distress Zofran PRN nausea/vomiting Liquifilm Tears PRN dry eye   Prognosis:  Hours - Days  Discharge Planning: Anticipated Hospital Death  Care plan was discussed with primary RN   Richardson Dopp, PA-C Palliative Medicine Team Team phone # [phone]  Thank you for allowing the Palliative Medicine Team to assist in the care of this patient. Please utilize secure chat with additional questions, if there is no response within 30 minutes please call the above phone number.  Palliative Medicine Team providers are available by phone from 7am to 7pm daily and can be reached through the team cell phone.  Should this patient require assistance outside of these hours, please call the patient's attending physician.  Portions of this note are a verbal dictation therefore any spelling and/or grammatical errors are due to the "Dragon Medical One" system interpretation.

## 2022-11-26 NOTE — TOC Progression Note (Signed)
Transition of Care [facility]) - Progression Note    Patient Details  Name: [name] MRN: [id number] Date of Birth: May 13, 1950  Transition [facility] CM/SW Contact  Mearl Latin, LCSW Phone Number: 11/26/2022, 12:36 PM  Clinical Narrative:    Patient under comfort care. TOC remain available if needs arise.         Expected Discharge Plan and Services                                               Social Determinants of Health (SDOH) Interventions SDOH Screenings   Food Insecurity: No Food Insecurity (11/12/2022)  Recent Concern: Food Insecurity - Food Insecurity Present (09/04/2022)  Housing: Low Risk  (11/12/2022)  Transportation Needs: No Transportation Needs (11/12/2022)  Recent Concern: Transportation Needs - Unmet Transportation Needs (09/04/2022)  Utilities: Not At Risk (11/12/2022)  Alcohol Screen: Low Risk  (09/04/2022)  Depression (PHQ2-9): Low Risk  (09/04/2022)  Financial Resource Strain: Low Risk  (09/04/2022)  Physical Activity: Insufficiently Active (09/04/2022)  Social Connections: Socially Isolated (09/04/2022)  Stress: No Stress Concern Present (09/04/2022)  Tobacco Use: Low Risk  (11/12/2022)    Readmission Risk Interventions    11/13/2022    1:42 PM  Readmission Risk Prevention Plan  Transportation Screening Complete  PCP or Specialist Appt within 5-7 Days Complete  Home Care Screening Complete  Medication Review (RN CM) Referral to Pharmacy

## 2022-11-26 NOTE — Progress Notes (Signed)
    Silva day#14 Subjective:   Summary: [name] is a 72 yo [name] is a 72 y.o. with PMH of CAD s/p CABG, chronic combined systolic and diastolic HF (EF<20% 06/05/2022), T2DM, HTN, and HLD found unresponsive by family after hearing her fall, brought into the ED after cardiac arrest s/p CPR with a rapidly declining Silva course who was transitioned to comfort care measures by [name] on 10/25  IV ativan now scheduled. No changes overnight.   Objective:  Vital signs in last 24 hours: Vitals:   11/24/22 2000 11/25/22 0743 11/25/22 1946 11/26/22 0719  BP: 128/80 123/73 125/76 (!) 113/58  Pulse: 62 60 70 68  Resp:  11 17 15   Temp: (!) 97.3 F (36.3 C)   97.8 F (36.6 C)  TempSrc: Axillary   Axillary  SpO2: 92% 100% 100% 97%  Weight:      Height:       Physical Exam:  Constitutional: Chonically ill appearing woman resting comfortably in bed without pain or restlessness. No respiratory distress or increased secretions.   Imaging: No results found.  Assessment/Plan:   Principal Problem:   Cardiac arrest [name]) Active Problems:   Ischemic cardiomyopathy   AKI (acute kidney injury) (HCC)   Pressure injury of skin   Metabolic acidosis   Acute liver failure without hepatic coma  Comfort care measures: Appreciate palliative care recommendations and support to [name] and her family members.  - DNI/DNR - No inpatient hospice at this time - Symptom management per palliative care comfort care protocol Dilaudid PRN pain/dyspnea/increased work of breathing/RR>25 Scheduled ativan q6h; PRN doses for breakthrough anxiety/seizure/sleep/distress Tylenol PRN pain/fever Biotin twice daily Benadryl PRN itching Robinul PRN secretions Haldol PRN agitation/delirium Zofran PRN nausea/vomiting Liquifilm Tears PRN dry eye  Dispo: Anticipate in-Silva death for this patient in comfort care measures.  Family Update: Team communicated with daughter, [name], today  for updates.  Julia Crocker, MD Internal Medicine Resident PGY-2 Please contact the on call pager after 5 pm and on weekends at [phone].

## 2022-11-27 DIAGNOSIS — I469 Cardiac arrest, cause unspecified: Secondary | ICD-10-CM | POA: Diagnosis not present

## 2022-11-27 NOTE — Progress Notes (Addendum)
    Hospital day#15 Subjective:   Summary: [name] is a 72 yo [name] is a 72 y.o. with PMH of CAD s/p CABG, chronic combined systolic and diastolic HF (EF<20% 06/05/2022), T2DM, HTN, and HLD found unresponsive by family after hearing her fall, brought into the ED after cardiac arrest s/p CPR with a rapidly declining hospital course who was transitioned to comfort care measures by [facility] on 10/25  No changes overnight. Resting comfortably in bed.   Objective:  Vital signs in last 24 hours: Vitals:   11/25/22 0743 11/25/22 1946 11/26/22 0719 11/27/22 0019  BP: 123/73 125/76 (!) 113/58 113/63  Pulse: 60 70 68 79  Resp: 11 17 15 16   Temp:   97.8 F (36.6 C) 98 F (36.7 C)  TempSrc:   Axillary Oral  SpO2: 100% 100% 97% 98%  Weight:      Height:       Physical Exam:  Constitutional: Ill-appearing, resting comfortably in bed. Shallow breathing.  Imaging: No results found.  Assessment/Plan:   Principal Problem:   Cardiac arrest [facility]) Active Problems:   Ischemic cardiomyopathy   AKI (acute kidney injury) (HCC)   Pressure injury of skin   Metabolic acidosis   Acute liver failure without hepatic coma  Comfort care measures: Appreciate palliative care's involvement with this case. [name] continues to have comfort care while in the hospital. She is not in any acute distress at this time. Has required more dilaudid and ativan today. Anticipate in-hospital death.   - DNI/DNR - No inpatient hospice at this time - Symptom management per palliative care comfort care protocol Dilaudid PRN pain/dyspnea/increased work of breathing/RR>25 Scheduled ativan q6h; PRN doses for breakthrough anxiety/seizure/sleep/distress Tylenol PRN pain/fever Biotin twice daily Benadryl PRN itching Robinul PRN secretions Haldol PRN agitation/delirium Zofran PRN nausea/vomiting Liquifilm Tears PRN dry eye  Dispo: Anticipate in-hospital death for this patient in comfort care  measures.  Family Update: Team communicated with daughter, [name], today for updates.  [name], MD Internal Medicine Resident PGY-1 Please contact the on call pager after 5 pm and on weekends at [phone].

## 2022-11-27 NOTE — Plan of Care (Signed)

## 2022-11-27 NOTE — Plan of Care (Signed)
  Problem: Education: Goal: Ability to describe self-care measures that may prevent or decrease complications (Diabetes Survival Skills Education) will improve Outcome: Not Progressing Goal: Individualized Educational Video(s) Outcome: Not Progressing   Problem: Coping: Goal: Ability to adjust to condition or change in health will improve Outcome: Not Progressing   Problem: Fluid Volume: Goal: Ability to maintain a balanced intake and output will improve Outcome: Not Progressing   Problem: Health Behavior/Discharge Planning: Goal: Ability to identify and utilize available resources and services will improve Outcome: Not Progressing Goal: Ability to manage health-related needs will improve Outcome: Not Progressing   Problem: Metabolic: Goal: Ability to maintain appropriate glucose levels will improve Outcome: Not Progressing   Problem: Nutritional: Goal: Maintenance of adequate nutrition will improve Outcome: Not Progressing Goal: Progress toward achieving an optimal weight will improve Outcome: Not Progressing   Problem: Skin Integrity: Goal: Risk for impaired skin integrity will decrease Outcome: Not Progressing   Problem: Tissue Perfusion: Goal: Adequacy of tissue perfusion will improve Outcome: Not Progressing   Problem: Education: Goal: Knowledge of General Education information will improve Description: Including pain rating scale, medication(s)/side effects and non-pharmacologic comfort measures Outcome: Not Progressing   Problem: Health Behavior/Discharge Planning: Goal: Ability to manage health-related needs will improve Outcome: Not Progressing   Problem: Clinical Measurements: Goal: Ability to maintain clinical measurements within normal limits will improve Outcome: Not Progressing Goal: Will remain free from infection Outcome: Not Progressing Goal: Diagnostic test results will improve Outcome: Not Progressing Goal: Respiratory complications will  improve Outcome: Not Progressing Goal: Cardiovascular complication will be avoided Outcome: Not Progressing   Problem: Activity: Goal: Risk for activity intolerance will decrease Outcome: Not Progressing   Problem: Nutrition: Goal: Adequate nutrition will be maintained Outcome: Not Progressing   Problem: Coping: Goal: Level of anxiety will decrease Outcome: Not Progressing   Problem: Elimination: Goal: Will not experience complications related to bowel motility Outcome: Not Progressing Goal: Will not experience complications related to urinary retention Outcome: Not Progressing   Problem: Pain Managment: Goal: General experience of comfort will improve Outcome: Not Progressing   Problem: Safety: Goal: Ability to remain free from injury will improve Outcome: Not Progressing   Problem: Skin Integrity: Goal: Risk for impaired skin integrity will decrease Outcome: Not Progressing   Problem: Safety: Goal: Non-violent Restraint(s) Outcome: Not Progressing   Problem: Education: Goal: Knowledge of the prescribed therapeutic regimen will improve Outcome: Not Progressing   Problem: Coping: Goal: Ability to identify and develop effective coping behavior will improve Outcome: Not Progressing   Problem: Clinical Measurements: Goal: Quality of life will improve Outcome: Not Progressing   Problem: Respiratory: Goal: Verbalizations of increased ease of respirations will increase Outcome: Not Progressing   Problem: Role Relationship: Goal: Family's ability to cope with current situation will improve Outcome: Not Progressing Goal: Ability to verbalize concerns, feelings, and thoughts to partner or family member will improve Outcome: Not Progressing   Problem: Pain Management: Goal: Satisfaction with pain management regimen will improve Outcome: Not Progressing

## 2022-11-27 NOTE — Progress Notes (Signed)
   Medical records reviewed including progress notes, MAR. Patient continues to receive about 4 doses of PRN dilaudid in a 24 hour period. Per RN yesterday, no need to schedule at this time. Scheduled IV Ativan remains effective.   Goals are clear for comfort-focused care at this time. PMT will continue to follow peripherally and remains available to family should the need arise.  Thank you for your referral and allowing PMT to assist in Mrs. [name] care.   Richardson Dopp, [facility] Palliative Medicine Team  Team Phone # [phone]   NO CHARGE

## 2022-11-28 DIAGNOSIS — I469 Cardiac arrest, cause unspecified: Secondary | ICD-10-CM | POA: Diagnosis not present

## 2022-11-30 NOTE — Accreditation Note (Signed)
Restraint death after removal soft wrist restraints logged by Laurene Footman RN on 11/29/2022 at 0758.

## 2022-11-30 NOTE — Progress Notes (Signed)
Patient time of death was 7:25 am. MD and family notified.  Post mortem care completed, body transferred to hospital [facility].

## 2022-11-30 NOTE — Death Summary Note (Signed)
Name: [name] MRN: [id number] DOB: Feb 24, 1950 72 y.o.  Date of Admission: 11/13/2022  7:19 AM Date of Discharge: 14-Dec-2022 Attending Physician: Johny Sax, MD  Discharge Diagnosis: Principal Problem:   Cardiac arrest [facility]) Active Problems:   Ischemic cardiomyopathy   AKI (acute kidney injury) (HCC)   Pressure injury of skin   Metabolic acidosis   Acute liver failure without hepatic coma   Cause of death: Cardiac Arrest Time of death: 7:25AM  Disposition and follow-up:   [name] was discharged from [facility] in expired condition.    Hospital Course:  [name] was a 72 y.o. with PMH of CAD s/p CABG, hx of HFrEF, T2DM, HTN, and HLD who was brought to the ED after a cardiac arrest. Pt's family heard a falling sound and found the patient unresponsive prompting them to call EMS. They started CPR in the meantime. Initial rhythm was PEA arrest then changed to vfib and was defibrillated x 3. She received 5 rounds of epinephrine and amiodarone until ROSC was achieved. She was intubated and admitted to the ICU. She stayed in the ICU from 10/14-10/18 and was stable to transfer out then. In the ICU she was treated for aspiration pneumonia with IV unasyn. She also had signs of liver and renal shock which were managed conservatively. During her hospitalization palliative care was consulted. After further discussions, the daughter HPOA decided to go with comfort measures for the patient. The patient expired on Nov 15, 2024at 7:25AM.   Signed: Hassan Rowan, [place], [place] 2022/12/14, 1:54 PM

## 2022-11-30 DEATH — deceased

## 2022-12-17 ENCOUNTER — Encounter: Payer: Medicare Other | Admitting: Internal Medicine
